# Patient Record
Sex: Female | Born: 1960 | Race: Black or African American | Hispanic: No | Smoking: Never smoker
Health system: Southern US, Community
[De-identification: ages and names within clinical notes are randomized; demographics above are authoritative.]

## PROBLEM LIST (undated history)

## (undated) DIAGNOSIS — T7840XA Allergy, unspecified, initial encounter: Secondary | ICD-10-CM

## (undated) DIAGNOSIS — I10 Essential (primary) hypertension: Secondary | ICD-10-CM

## (undated) DIAGNOSIS — R011 Cardiac murmur, unspecified: Secondary | ICD-10-CM

## (undated) DIAGNOSIS — E785 Hyperlipidemia, unspecified: Secondary | ICD-10-CM

## (undated) DIAGNOSIS — E119 Type 2 diabetes mellitus without complications: Secondary | ICD-10-CM

## (undated) HISTORY — DX: Type 2 diabetes mellitus without complications: E11.9

## (undated) HISTORY — PX: UTERINE FIBROID SURGERY: SHX826

## (undated) HISTORY — DX: Hyperlipidemia, unspecified: E78.5

## (undated) HISTORY — DX: Cardiac murmur, unspecified: R01.1

## (undated) HISTORY — PX: COLONOSCOPY: SHX174

## (undated) HISTORY — DX: Allergy, unspecified, initial encounter: T78.40XA

## (undated) HISTORY — DX: Essential (primary) hypertension: I10

---

## 1998-01-06 ENCOUNTER — Encounter: Payer: Self-pay | Admitting: Emergency Medicine

## 1998-01-06 ENCOUNTER — Emergency Department (HOSPITAL_COMMUNITY): Admission: EM | Admit: 1998-01-06 | Discharge: 1998-01-06 | Payer: Self-pay | Admitting: Emergency Medicine

## 1998-03-23 ENCOUNTER — Inpatient Hospital Stay (HOSPITAL_COMMUNITY): Admission: RE | Admit: 1998-03-23 | Discharge: 1998-03-25 | Payer: Self-pay | Admitting: Obstetrics and Gynecology

## 1999-03-28 ENCOUNTER — Other Ambulatory Visit: Admission: RE | Admit: 1999-03-28 | Discharge: 1999-03-28 | Payer: Self-pay | Admitting: Obstetrics and Gynecology

## 2000-06-04 ENCOUNTER — Other Ambulatory Visit: Admission: RE | Admit: 2000-06-04 | Discharge: 2000-06-04 | Payer: Self-pay | Admitting: Unknown Physician Specialty

## 2001-07-15 ENCOUNTER — Other Ambulatory Visit: Admission: RE | Admit: 2001-07-15 | Discharge: 2001-07-15 | Payer: Self-pay | Admitting: Obstetrics and Gynecology

## 2002-08-11 ENCOUNTER — Other Ambulatory Visit: Admission: RE | Admit: 2002-08-11 | Discharge: 2002-08-11 | Payer: Self-pay | Admitting: Obstetrics and Gynecology

## 2003-09-08 ENCOUNTER — Other Ambulatory Visit: Admission: RE | Admit: 2003-09-08 | Discharge: 2003-09-08 | Payer: Self-pay | Admitting: Obstetrics and Gynecology

## 2005-11-07 ENCOUNTER — Ambulatory Visit: Payer: Self-pay | Admitting: Family Medicine

## 2006-01-21 ENCOUNTER — Ambulatory Visit: Payer: Self-pay | Admitting: Family Medicine

## 2006-01-22 ENCOUNTER — Ambulatory Visit: Payer: Self-pay | Admitting: Family Medicine

## 2006-03-20 ENCOUNTER — Ambulatory Visit: Payer: Self-pay | Admitting: Family Medicine

## 2006-03-27 ENCOUNTER — Ambulatory Visit: Payer: Self-pay | Admitting: Family Medicine

## 2006-04-10 ENCOUNTER — Ambulatory Visit: Payer: Self-pay | Admitting: Family Medicine

## 2006-04-10 LAB — CONVERTED CEMR LAB
BUN: 9 mg/dL (ref 6–23)
CO2: 29 meq/L (ref 19–32)
Calcium: 9.4 mg/dL (ref 8.4–10.5)
Chloride: 99 meq/L (ref 96–112)
Creatinine, Ser: 0.9 mg/dL (ref 0.4–1.2)
GFR calc non Af Amer: 72 mL/min
Glomerular Filtration Rate, Af Am: 87 mL/min/{1.73_m2}
Glucose, Bld: 88 mg/dL (ref 70–99)
Potassium: 3.3 meq/L — ABNORMAL LOW (ref 3.5–5.1)
Sodium: 135 meq/L (ref 135–145)

## 2006-04-17 ENCOUNTER — Ambulatory Visit: Payer: Self-pay | Admitting: Family Medicine

## 2006-04-17 LAB — CONVERTED CEMR LAB: Potassium: 3.1 meq/L — ABNORMAL LOW (ref 3.5–5.1)

## 2006-04-25 ENCOUNTER — Ambulatory Visit: Payer: Self-pay | Admitting: Family Medicine

## 2006-04-25 LAB — CONVERTED CEMR LAB
BUN: 7 mg/dL (ref 6–23)
CO2: 29 meq/L (ref 19–32)
Calcium: 9 mg/dL (ref 8.4–10.5)
Chloride: 102 meq/L (ref 96–112)
Creatinine, Ser: 0.8 mg/dL (ref 0.4–1.2)
GFR calc non Af Amer: 82 mL/min
Glomerular Filtration Rate, Af Am: 99 mL/min/{1.73_m2}
Glucose, Bld: 128 mg/dL — ABNORMAL HIGH (ref 70–99)
Potassium: 3.5 meq/L (ref 3.5–5.1)
Sodium: 137 meq/L (ref 135–145)

## 2006-05-10 ENCOUNTER — Ambulatory Visit: Payer: Self-pay | Admitting: Family Medicine

## 2006-05-10 LAB — CONVERTED CEMR LAB
BUN: 9 mg/dL
CO2: 32 meq/L
Calcium: 9.5 mg/dL
Chloride: 101 meq/L
Creatinine, Ser: 0.8 mg/dL
GFR calc Af Amer: 99 mL/min
GFR calc non Af Amer: 82 mL/min
Glucose, Bld: 74 mg/dL
Potassium: 3.3 meq/L — ABNORMAL LOW
Sodium: 139 meq/L

## 2006-05-15 DIAGNOSIS — J309 Allergic rhinitis, unspecified: Secondary | ICD-10-CM

## 2006-05-15 HISTORY — DX: Allergic rhinitis, unspecified: J30.9

## 2006-05-17 ENCOUNTER — Ambulatory Visit: Payer: Self-pay | Admitting: Family Medicine

## 2006-05-17 LAB — CONVERTED CEMR LAB: Potassium: 4.1 meq/L (ref 3.5–5.3)

## 2006-06-21 ENCOUNTER — Ambulatory Visit: Payer: Self-pay | Admitting: Family Medicine

## 2006-06-21 LAB — CONVERTED CEMR LAB
BUN: 10 mg/dL (ref 6–23)
CO2: 29 meq/L (ref 19–32)
Calcium: 9.3 mg/dL (ref 8.4–10.5)
Chloride: 99 meq/L (ref 96–112)
Creatinine, Ser: 0.9 mg/dL (ref 0.4–1.2)
GFR calc Af Amer: 87 mL/min
GFR calc non Af Amer: 72 mL/min
Glucose, Bld: 83 mg/dL (ref 70–99)
Potassium: 3.4 meq/L — ABNORMAL LOW (ref 3.5–5.1)
Sodium: 135 meq/L (ref 135–145)

## 2006-07-19 ENCOUNTER — Ambulatory Visit: Payer: Self-pay | Admitting: Family Medicine

## 2006-07-19 LAB — CONVERTED CEMR LAB
BUN: 11 mg/dL (ref 6–23)
CO2: 30 meq/L (ref 19–32)
Calcium: 9.3 mg/dL (ref 8.4–10.5)
Chloride: 105 meq/L (ref 96–112)
Creatinine, Ser: 0.9 mg/dL (ref 0.4–1.2)
GFR calc Af Amer: 87 mL/min
GFR calc non Af Amer: 72 mL/min
Glucose, Bld: 125 mg/dL — ABNORMAL HIGH (ref 70–99)
Potassium: 3.6 meq/L (ref 3.5–5.1)
Sodium: 141 meq/L (ref 135–145)

## 2006-10-31 ENCOUNTER — Ambulatory Visit: Payer: Self-pay | Admitting: Family Medicine

## 2006-10-31 DIAGNOSIS — M549 Dorsalgia, unspecified: Secondary | ICD-10-CM

## 2006-10-31 DIAGNOSIS — I10 Essential (primary) hypertension: Secondary | ICD-10-CM

## 2006-10-31 HISTORY — DX: Essential (primary) hypertension: I10

## 2006-10-31 HISTORY — DX: Dorsalgia, unspecified: M54.9

## 2006-10-31 LAB — CONVERTED CEMR LAB
Bilirubin Urine: NEGATIVE
Blood in Urine, dipstick: NEGATIVE
Glucose, Urine, Semiquant: NEGATIVE
Ketones, urine, test strip: NEGATIVE
Nitrite: NEGATIVE
Protein, U semiquant: NEGATIVE
Specific Gravity, Urine: <1.005
Urobilinogen, UA: NEGATIVE
WBC Urine, dipstick: NEGATIVE
pH: 5

## 2006-11-03 LAB — CONVERTED CEMR LAB
BUN: 12 mg/dL (ref 6–23)
CO2: 29 meq/L (ref 19–32)
Calcium: 9.2 mg/dL (ref 8.4–10.5)
Chloride: 100 meq/L (ref 96–112)
Creatinine, Ser: 0.9 mg/dL (ref 0.4–1.2)
GFR calc Af Amer: 86 mL/min
GFR calc non Af Amer: 71 mL/min
Glucose, Bld: 97 mg/dL (ref 70–99)
Potassium: 3.3 meq/L — ABNORMAL LOW (ref 3.5–5.1)
Sodium: 138 meq/L (ref 135–145)

## 2006-11-04 ENCOUNTER — Telehealth (INDEPENDENT_AMBULATORY_CARE_PROVIDER_SITE_OTHER): Payer: Self-pay | Admitting: *Deleted

## 2006-11-11 ENCOUNTER — Ambulatory Visit: Payer: Self-pay | Admitting: Family Medicine

## 2006-11-11 LAB — CONVERTED CEMR LAB: Potassium: 3.6 meq/L (ref 3.5–5.1)

## 2006-11-12 ENCOUNTER — Telehealth (INDEPENDENT_AMBULATORY_CARE_PROVIDER_SITE_OTHER): Payer: Self-pay | Admitting: *Deleted

## 2006-11-13 ENCOUNTER — Encounter (INDEPENDENT_AMBULATORY_CARE_PROVIDER_SITE_OTHER): Payer: Self-pay | Admitting: *Deleted

## 2006-12-04 ENCOUNTER — Telehealth (INDEPENDENT_AMBULATORY_CARE_PROVIDER_SITE_OTHER): Payer: Self-pay | Admitting: *Deleted

## 2006-12-05 ENCOUNTER — Ambulatory Visit: Payer: Self-pay | Admitting: Family Medicine

## 2006-12-09 ENCOUNTER — Encounter (INDEPENDENT_AMBULATORY_CARE_PROVIDER_SITE_OTHER): Payer: Self-pay | Admitting: *Deleted

## 2006-12-09 LAB — CONVERTED CEMR LAB
BUN: 8 mg/dL (ref 6–23)
CO2: 30 meq/L (ref 19–32)
Calcium: 9.3 mg/dL (ref 8.4–10.5)
Chloride: 101 meq/L (ref 96–112)
Creatinine, Ser: 0.8 mg/dL (ref 0.4–1.2)
GFR calc Af Amer: 99 mL/min
GFR calc non Af Amer: 82 mL/min
Glucose, Bld: 87 mg/dL (ref 70–99)
Potassium: 3.8 meq/L (ref 3.5–5.1)
Sodium: 136 meq/L (ref 135–145)

## 2007-01-01 ENCOUNTER — Ambulatory Visit: Payer: Self-pay | Admitting: Family Medicine

## 2007-01-01 LAB — CONVERTED CEMR LAB
BUN: 10 mg/dL (ref 6–23)
CO2: 29 meq/L (ref 19–32)
Calcium: 9.2 mg/dL (ref 8.4–10.5)
Chloride: 104 meq/L (ref 96–112)
Creatinine, Ser: 0.8 mg/dL (ref 0.4–1.2)
GFR calc Af Amer: 99 mL/min
GFR calc non Af Amer: 82 mL/min
Glucose, Bld: 70 mg/dL (ref 70–99)
Potassium: 3.5 meq/L (ref 3.5–5.1)
Sodium: 140 meq/L (ref 135–145)

## 2007-01-02 ENCOUNTER — Telehealth (INDEPENDENT_AMBULATORY_CARE_PROVIDER_SITE_OTHER): Payer: Self-pay | Admitting: *Deleted

## 2007-01-30 ENCOUNTER — Ambulatory Visit: Payer: Self-pay | Admitting: Family Medicine

## 2007-01-30 DIAGNOSIS — M25569 Pain in unspecified knee: Secondary | ICD-10-CM | POA: Insufficient documentation

## 2007-01-30 HISTORY — DX: Pain in unspecified knee: M25.569

## 2007-02-02 LAB — CONVERTED CEMR LAB
Cholesterol: 199 mg/dL (ref 0–200)
HDL: 66.4 mg/dL (ref >39.0)
LDL Cholesterol: 125 mg/dL — ABNORMAL HIGH (ref 0–99)
Total CHOL/HDL Ratio: 3
Triglycerides: 36 mg/dL (ref 0–149)
VLDL: 7 mg/dL (ref 0–40)

## 2007-02-03 ENCOUNTER — Telehealth (INDEPENDENT_AMBULATORY_CARE_PROVIDER_SITE_OTHER): Payer: Self-pay | Admitting: *Deleted

## 2007-02-17 ENCOUNTER — Telehealth (INDEPENDENT_AMBULATORY_CARE_PROVIDER_SITE_OTHER): Payer: Self-pay | Admitting: *Deleted

## 2007-02-20 ENCOUNTER — Encounter (INDEPENDENT_AMBULATORY_CARE_PROVIDER_SITE_OTHER): Payer: Self-pay | Admitting: Family Medicine

## 2007-02-28 ENCOUNTER — Ambulatory Visit: Payer: Self-pay | Admitting: Family Medicine

## 2007-02-28 ENCOUNTER — Telehealth (INDEPENDENT_AMBULATORY_CARE_PROVIDER_SITE_OTHER): Payer: Self-pay | Admitting: *Deleted

## 2007-02-28 ENCOUNTER — Encounter (INDEPENDENT_AMBULATORY_CARE_PROVIDER_SITE_OTHER): Payer: Self-pay | Admitting: *Deleted

## 2007-02-28 DIAGNOSIS — R1013 Epigastric pain: Secondary | ICD-10-CM

## 2007-02-28 DIAGNOSIS — R141 Gas pain: Secondary | ICD-10-CM | POA: Insufficient documentation

## 2007-02-28 DIAGNOSIS — K5289 Other specified noninfective gastroenteritis and colitis: Secondary | ICD-10-CM | POA: Insufficient documentation

## 2007-02-28 DIAGNOSIS — R143 Flatulence: Secondary | ICD-10-CM

## 2007-02-28 DIAGNOSIS — R142 Eructation: Secondary | ICD-10-CM

## 2007-02-28 HISTORY — DX: Other specified noninfective gastroenteritis and colitis: K52.89

## 2007-02-28 HISTORY — DX: Epigastric pain: R10.13

## 2007-02-28 HISTORY — DX: Gas pain: R14.1

## 2007-02-28 LAB — CONVERTED CEMR LAB
ALT: 19 units/L (ref 0–35)
AST: 23 units/L (ref 0–37)
Albumin: 4 g/dL (ref 3.5–5.2)
Alkaline Phosphatase: 59 units/L (ref 39–117)
Amylase: 121 units/L (ref 27–131)
Basophils Absolute: 0 10*3/uL (ref 0.0–0.1)
Basophils Relative: 0.8 % (ref 0.0–1.0)
Bilirubin, Direct: 0.1 mg/dL (ref 0.0–0.3)
Eosinophils Absolute: 0.1 10*3/uL (ref 0.0–0.6)
Eosinophils Relative: 1.5 % (ref 0.0–5.0)
HCT: 43.4 % (ref 36.0–46.0)
Hemoglobin: 14.9 g/dL (ref 12.0–15.0)
Lipase: 17 units/L (ref 11.0–59.0)
Lymphocytes Relative: 55.1 % — ABNORMAL HIGH (ref 12.0–46.0)
MCHC: 34.4 g/dL (ref 30.0–36.0)
MCV: 94.7 fL (ref 78.0–100.0)
Monocytes Absolute: 0.5 10*3/uL (ref 0.2–0.7)
Monocytes Relative: 11.2 % — ABNORMAL HIGH (ref 3.0–11.0)
Neutro Abs: 1.3 10*3/uL — ABNORMAL LOW (ref 1.4–7.7)
Neutrophils Relative %: 31.4 % — ABNORMAL LOW (ref 43.0–77.0)
Platelets: 216 10*3/uL (ref 150–400)
RBC: 4.58 M/uL (ref 3.87–5.11)
RDW: 12.1 % (ref 11.5–14.6)
Total Bilirubin: 1 mg/dL (ref 0.3–1.2)
Total Protein: 7.2 g/dL (ref 6.0–8.3)
WBC: 4.1 10*3/uL — ABNORMAL LOW (ref 4.5–10.5)

## 2007-03-17 ENCOUNTER — Telehealth (INDEPENDENT_AMBULATORY_CARE_PROVIDER_SITE_OTHER): Payer: Self-pay | Admitting: *Deleted

## 2007-03-21 ENCOUNTER — Encounter (INDEPENDENT_AMBULATORY_CARE_PROVIDER_SITE_OTHER): Payer: Self-pay | Admitting: *Deleted

## 2007-04-02 ENCOUNTER — Ambulatory Visit: Payer: Self-pay | Admitting: Family Medicine

## 2007-04-02 DIAGNOSIS — J019 Acute sinusitis, unspecified: Secondary | ICD-10-CM

## 2007-04-02 HISTORY — DX: Acute sinusitis, unspecified: J01.90

## 2007-04-04 ENCOUNTER — Telehealth (INDEPENDENT_AMBULATORY_CARE_PROVIDER_SITE_OTHER): Payer: Self-pay | Admitting: *Deleted

## 2007-04-04 ENCOUNTER — Telehealth (INDEPENDENT_AMBULATORY_CARE_PROVIDER_SITE_OTHER): Payer: Self-pay | Admitting: Family Medicine

## 2007-04-05 ENCOUNTER — Telehealth (INDEPENDENT_AMBULATORY_CARE_PROVIDER_SITE_OTHER): Payer: Self-pay | Admitting: *Deleted

## 2007-05-27 ENCOUNTER — Ambulatory Visit: Payer: Self-pay | Admitting: Family Medicine

## 2007-05-28 ENCOUNTER — Encounter (INDEPENDENT_AMBULATORY_CARE_PROVIDER_SITE_OTHER): Payer: Self-pay | Admitting: *Deleted

## 2007-05-28 LAB — CONVERTED CEMR LAB
BUN: 10 mg/dL (ref 6–23)
CO2: 29 meq/L (ref 19–32)
Calcium: 9.5 mg/dL (ref 8.4–10.5)
Chloride: 104 meq/L (ref 96–112)
Cholesterol: 200 mg/dL (ref 0–200)
Creatinine, Ser: 0.9 mg/dL (ref 0.4–1.2)
GFR calc Af Amer: 86 mL/min
GFR calc non Af Amer: 71 mL/min
Glucose, Bld: 101 mg/dL — ABNORMAL HIGH (ref 70–99)
HDL: 67.8 mg/dL (ref >39.0)
LDL Cholesterol: 124 mg/dL — ABNORMAL HIGH (ref 0–99)
Potassium: 3.5 meq/L (ref 3.5–5.1)
Sodium: 140 meq/L (ref 135–145)
Total CHOL/HDL Ratio: 2.9
Triglycerides: 41 mg/dL (ref 0–149)
VLDL: 8 mg/dL (ref 0–40)

## 2007-06-30 ENCOUNTER — Telehealth (INDEPENDENT_AMBULATORY_CARE_PROVIDER_SITE_OTHER): Payer: Self-pay | Admitting: *Deleted

## 2007-07-02 ENCOUNTER — Ambulatory Visit: Payer: Self-pay | Admitting: Family Medicine

## 2007-07-03 ENCOUNTER — Encounter (INDEPENDENT_AMBULATORY_CARE_PROVIDER_SITE_OTHER): Payer: Self-pay | Admitting: *Deleted

## 2007-07-03 ENCOUNTER — Telehealth (INDEPENDENT_AMBULATORY_CARE_PROVIDER_SITE_OTHER): Payer: Self-pay | Admitting: *Deleted

## 2007-07-03 LAB — CONVERTED CEMR LAB: TSH: 1.03 microintl units/mL (ref 0.35–5.50)

## 2007-11-05 ENCOUNTER — Ambulatory Visit: Payer: Self-pay | Admitting: Family Medicine

## 2007-11-05 DIAGNOSIS — I10 Essential (primary) hypertension: Secondary | ICD-10-CM

## 2007-11-05 HISTORY — DX: Essential (primary) hypertension: I10

## 2007-11-16 LAB — CONVERTED CEMR LAB
ALT: 17 units/L (ref 0–35)
AST: 22 units/L (ref 0–37)
Albumin: 4.1 g/dL (ref 3.5–5.2)
Alkaline Phosphatase: 60 units/L (ref 39–117)
BUN: 7 mg/dL (ref 6–23)
Bilirubin, Direct: 0.1 mg/dL (ref 0.0–0.3)
CO2: 30 meq/L (ref 19–32)
Calcium: 9.2 mg/dL (ref 8.4–10.5)
Chloride: 104 meq/L (ref 96–112)
Cholesterol: 192 mg/dL (ref 0–200)
Creatinine, Ser: 0.8 mg/dL (ref 0.4–1.2)
GFR calc Af Amer: 98 mL/min
GFR calc non Af Amer: 81 mL/min
Glucose, Bld: 100 mg/dL — ABNORMAL HIGH (ref 70–99)
HDL: 57.6 mg/dL (ref >39.0)
LDL Cholesterol: 123 mg/dL — ABNORMAL HIGH (ref 0–99)
Potassium: 3.6 meq/L (ref 3.5–5.1)
Sodium: 139 meq/L (ref 135–145)
Total Bilirubin: 1.1 mg/dL (ref 0.3–1.2)
Total CHOL/HDL Ratio: 3.3
Total Protein: 7.2 g/dL (ref 6.0–8.3)
Triglycerides: 59 mg/dL (ref 0–149)
VLDL: 12 mg/dL (ref 0–40)

## 2007-11-17 ENCOUNTER — Encounter (INDEPENDENT_AMBULATORY_CARE_PROVIDER_SITE_OTHER): Payer: Self-pay | Admitting: *Deleted

## 2007-11-20 ENCOUNTER — Ambulatory Visit: Payer: Self-pay | Admitting: Family Medicine

## 2007-11-20 DIAGNOSIS — R011 Cardiac murmur, unspecified: Secondary | ICD-10-CM

## 2007-11-20 HISTORY — DX: Cardiac murmur, unspecified: R01.1

## 2007-11-26 ENCOUNTER — Encounter: Payer: Self-pay | Admitting: Family Medicine

## 2007-11-26 ENCOUNTER — Ambulatory Visit: Payer: Self-pay

## 2007-12-01 ENCOUNTER — Encounter (INDEPENDENT_AMBULATORY_CARE_PROVIDER_SITE_OTHER): Payer: Self-pay | Admitting: *Deleted

## 2007-12-22 ENCOUNTER — Telehealth (INDEPENDENT_AMBULATORY_CARE_PROVIDER_SITE_OTHER): Payer: Self-pay | Admitting: *Deleted

## 2007-12-22 ENCOUNTER — Encounter (INDEPENDENT_AMBULATORY_CARE_PROVIDER_SITE_OTHER): Payer: Self-pay | Admitting: *Deleted

## 2008-01-16 ENCOUNTER — Telehealth (INDEPENDENT_AMBULATORY_CARE_PROVIDER_SITE_OTHER): Payer: Self-pay | Admitting: *Deleted

## 2008-02-06 ENCOUNTER — Telehealth (INDEPENDENT_AMBULATORY_CARE_PROVIDER_SITE_OTHER): Payer: Self-pay | Admitting: *Deleted

## 2008-05-07 ENCOUNTER — Telehealth: Payer: Self-pay | Admitting: Family Medicine

## 2008-05-07 ENCOUNTER — Encounter: Payer: Self-pay | Admitting: Family Medicine

## 2008-08-06 ENCOUNTER — Telehealth (INDEPENDENT_AMBULATORY_CARE_PROVIDER_SITE_OTHER): Payer: Self-pay | Admitting: *Deleted

## 2008-12-10 ENCOUNTER — Telehealth (INDEPENDENT_AMBULATORY_CARE_PROVIDER_SITE_OTHER): Payer: Self-pay | Admitting: *Deleted

## 2008-12-10 ENCOUNTER — Encounter: Payer: Self-pay | Admitting: Family Medicine

## 2008-12-13 ENCOUNTER — Telehealth (INDEPENDENT_AMBULATORY_CARE_PROVIDER_SITE_OTHER): Payer: Self-pay | Admitting: *Deleted

## 2010-05-25 NOTE — Progress Notes (Signed)
  Phone Note Outgoing Call Call back at [facility] Phone [phone]   Call placed by: Ardyth Man,  January 16, 2008 9:19 AM Call placed to: Patient Summary of Call: Patient aware by way of message per patient request echo normal Ardyth Man  January 16, 2008 9:19 AM

## 2010-05-25 NOTE — Letter (Signed)
Summary: Work Dietitian at [employer]  [address] [place], [place] [address]   Phone: [phone]  Fax: [phone]    Today's Date: December 10, 2008  Name of Patient: Angela Garner  The above named patient had a medical visit today at:  am / pm.  Please take this into consideration when reviewing the time away from work/school.    Special Instructions:  [  ] None  [  ] To be off the remainder of today, returning to the normal work / school schedule tomorrow.  [  ] To be off until the next scheduled appointment on ______________________.  [  X] Other It is safe for pt to participate in yoga while on bp medication.________________________________________________________________ ________________________________________________________________________   Sincerely yours,   Loreen Freud DO

## 2010-05-25 NOTE — Assessment & Plan Note (Signed)
Summary: BP HIGH/CDJ   Vital Signs:  Patient Profile:   50 Years Old Female Weight:      193 pounds Pulse rate:   66 / minute BP sitting:   132 / 90  (left arm)  Vitals Entered By: Doristine Devoid (December 05, 2006 12:03 PM)               Chief Complaint:  BP HAS BEEN UP SINCE MON FELT SOMETHING WASNT RIGHT BECAUSE PT HAD HEADACHE AND DOESN'T NORMALLY HAVE THEM.  SHE ALSO HAD A PREVIOUS DR. APPT THAT REVEALED BP HIGH AT 150/100. Marland Kitchen  History of Present Illness: Reports noted elevation of BP over last couple days. No other complaints except mild headache which resolved. The patient was seen by her urologist yesterday and her blood pressure was also elevated at that office.The patient denies any chest pain or shortness of breath.        Physical Exam  General:     Well-developed,well-nourished,in no acute distress; alert,appropriate and cooperative throughout examination Neck:     No deformities, masses, or tenderness noted. Lungs:     Normal respiratory effort, chest expands symmetrically. Lungs are clear to auscultation, no crackles or wheezes. Heart:     regular rhythm, no murmur, no gallop, and no rub.   Extremities:     No clubbing, cyanosis, edema, or deformity noted with normal full range of motion of all joints.   Additional Exam:     her EKG showed sinus bradycardia with no ST elevations or depressions.  There is no PVCs or PACs.    Impression & Recommendations:  Problem # 1:  HYPERTENSION, BENIGN ESSENTIAL (ICD-74.19) 50 year old female with elevation of her blood pressure. She will start lisinopril 10 mg daily.  Side effects were reviewed. Patient follow with me in one week. Her updated medication list for this problem includes:    Cartia Xt 300 Mg Cp24 (Diltiazem hcl coated beads) .Marland Kitchen... 1 by mouth qd    Lisinopril 10 Mg Tabs (Lisinopril) .Marland Kitchen... 1 by mouth qd  Orders: TLB-BMP (Basic Metabolic Panel-BMET) (80048-METABOL)  BP today: 132/90 Prior BP: 120/80  (10/31/2006)  Labs Reviewed: Creat: 0.9 (10/31/2006)   Complete Medication List: 1)  Cartia Xt 300 Mg Cp24 (Diltiazem hcl coated beads) .Marland Kitchen.. 1 by mouth qd 2)  Lisinopril 10 Mg Tabs (Lisinopril) .Marland Kitchen.. 1 by mouth qd     Prescriptions: LISINOPRIL 10 MG  TABS (LISINOPRIL) 1 by mouth qd  #90 x 1   Entered and Authorized by:   Leanne Chang MD   Signed by:   Leanne Chang MD on 12/05/2006   Method used:   Print then Give to Patient   RxID:   [id number] CARTIA XT 300 MG  CP24 (DILTIAZEM HCL COATED BEADS) 1 by mouth qd  #30 x 0   Entered and Authorized by:   Leanne Chang MD   Signed by:   Leanne Chang MD on 12/05/2006   Method used:   Historical   RxID:   [id number]

## 2010-05-25 NOTE — Assessment & Plan Note (Signed)
Summary: pt is having problems going to the bathroom she is having gas...   Vital Signs:  Patient Profile:   50 Years Old Female Weight:      186.6 pounds Temp:     98.1 degrees F Pulse rate:   64 / minute BP sitting:   130 / 88  (right arm)  Vitals Entered By: Doristine Devoid (February 28, 2007 11:08 AM)                 Chief Complaint:  STOMACH DISCOMFORT WITH BURNING FEELING SINCE FRIDAY ALOT OF NAUSEA .  History of Present Illness: Started 1 week ago with nausea, diarrhea, flatulence. indigestion, body aches. Reports diarrhea resolved but still having some burning sensation in  epigastric area which occurs after eating. Not severe. Denies vomiting, fever or chills. Drinking plenty of fluid.  Denies urinary symptoms.        Physical Exam  General:     Well-developed,well-nourished,in no acute distress; alert,appropriate and cooperative throughout examination Mouth:     Oral mucosa and oropharynx without lesions or exudates.  Moist. Neck:     No deformities, masses, or tenderness noted. Lungs:     Normal respiratory effort, chest expands symmetrically. Lungs are clear to auscultation, no crackles or wheezes. Heart:     Normal rate and regular rhythm. S1 and S2 normal without gallop, murmur, click, rub or other extra sounds. Abdomen:     Mild epigastric tenderness. No rebound or guarding.no rigidity, no abdominal hernia, no inguinal hernia, no hepatomegaly, no splenomegaly, and bowel sounds hyperactive.      Impression & Recommendations:  Problem # 1:  GASTROENTERITIS (ICD-558.9) Symptoms consistent with gastroenteritis, but given discomfort of epigastrum, Hepatobiliary disease, needs to be r/o  Discussed use of medication and role of diet. Encouraged clear liquids and electrolyte replacement fluids. Instructed to call if any signs of worsening dehydration.  Advise OTC Prilosec daily for 2 weeks F/u if symptoms worsen  Problem # 2:  ABDOMINAL PAIN, EPIGASTRIC  (ICD-789.06) As stated above. Orders: Radiology Referral (Radiology)   Complete Medication List: 1)  Cartia Xt 300 Mg Cp24 (Diltiazem hcl coated beads) .Marland Kitchen.. 1 by mouth qd 2)  Lisinopril 10 Mg Tabs (Lisinopril) .Marland Kitchen.. 1 by mouth qd  Other Orders: TLB-Hepatic/Liver Function Pnl (80076-HEPATIC) Venipuncture (57846) TLB-CBC Platelet - w/Differential (85025-CBCD) TLB-Amylase (82150-AMYL) TLB-Lipase (83690-LIPASE)     ]

## 2010-05-25 NOTE — Letter (Signed)
Summary: Results Follow-up Letter  [place] [facility]  [address] [place], [place] [address]   Phone: [phone]  Fax: [phone]    12/01/2007        Angela Garner [address] [place], [place]  [address]  Dear Ms. AFARI,   The following are the results of your recent test(s):  Test     Result     Pap Smear    Normal_______  Not Normal_____       Comments: _________________________________________________________ Cholesterol LDL(Bad cholesterol):          Your goal is less than:         HDL (Good cholesterol):        Your goal is more than: _________________________________________________________ Other Tests:   _________________________________________________________  Please call for an appointment Or _Please see attached report.________________________________________________________ _________________________________________________________ _________________________________________________________  Sincerely,  Ardyth Man [place] at [facility]

## 2010-05-25 NOTE — Progress Notes (Signed)
  Phone Note Outgoing Call Call back at [facility] Phone [phone]   Call placed by: Ardyth Man,  February 03, 2007 9:16 AM Call placed to: Patient Summary of Call: [facility] ...................................................................Ardyth Man  February 03, 2007 9:17 AM   Follow-up for Phone Call        Pt. aware ...................................................................Ardyth Man  February 03, 2007 12:47 PM  Follow-up by: Ardyth Man,  February 03, 2007 12:47 PM

## 2010-05-25 NOTE — Progress Notes (Signed)
Summary:  OUT OF MEDS  Phone Note Call from Patient Call back at [facility] -[phone]   Caller: Patient Summary of Call: PATIENT SAYS SHE DOES NOT HAVE ANY INSURANCE AT THIS TIME, BUT SAYS SHE NEEDS TO SEE DR LOWNE FOR A MED REFILL OFFICE VISIT---SAYS SHE CANT AFFORD THE VISIT EVEN IF OUR TOTAL CHARGE IS ONLY $125 BECAUSE SHE KNOWS THAT DOES NOT INCLUDE LABS---CAN SHE GET MORE REFILLS AT THIS TIME AND PUT OFF OFFICE VISIT FOR A WHILE??   ( I CAN SEND HER THE PAPERWORK ABOUT HELP ON PAYMENTS)  SHE DID NOT TELL ME WHICH OF HER MEDICATIONS SHE IS OUT  PLEASE CALL [phone] AND LEAVE MESSAGE IF SHE DOESNT ANSWER,  OR CALL [phone] Initial call taken by: Jerolyn Shin,  December 13, 2008 3:12 PM  Follow-up for Phone Call        Pt has not been here in a year!!  We can not put it off anymore.  We may be able to put off labs.   Follow-up by: Loreen Freud DO,  December 13, 2008 5:11 PM  Additional Follow-up for Phone Call Additional follow up Details #1::        left message to call  office...Marland KitchenMarland KitchenFelecia Deloach CMA  December 14, 2008 10:31 AM left message to call  office.Felecia Deloach CMA  December 15, 2008 1:23 PM    Additional Follow-up for Phone Call Additional follow up Details #2::    left pt detail message of dr Laury Axon suggestion...............Marland KitchenFelecia Deloach CMA  December 17, 2008 12:11 PM

## 2010-05-25 NOTE — Letter (Signed)
Summary: Results Follow-up Letter  [place] [facility]  [address] [place], [place] [address]   Phone: [phone]  Fax: [phone]    11/13/2006        Lauretta Sallas [address] [place], [place]  [address]  Dear Ms. AFARI,   The following are the results of your recent test(s):  Test     Result     Pap Smear    Normal_______  Not Normal_____       Comments: _________________________________________________________ Cholesterol LDL(Bad cholesterol):          Your goal is less than:         HDL (Good cholesterol):        Your goal is more than: _________________________________________________________ Other Tests:   _________________________________________________________  Please call for an appointment Or Please see attached._________________________________________________________ _________________________________________________________ _________________________________________________________  Sincerely,  Ardyth Man [place] at [facility]

## 2010-05-25 NOTE — Progress Notes (Signed)
  Phone Note Outgoing Call Call back at [facility]   Call placed by: Ardyth Man,  February 28, 2007 4:42 PM Call placed to: Specialist Summary of Call: Spoke w/ [facility], Corie technologist, Negative study ...................................................................Ardyth Man  February 28, 2007 4:52 PM   Follow-up for Phone Call        Advise patient okay. Hopefully she is feeling better. Follow-up by: Leanne Chang MD,  March 03, 2007 3:40 PM  Additional Follow-up for Phone Call Additional follow up Details #1::        [facility] Additional Follow-up by: Shonna Chock,  March 04, 2007 4:41 PM    Additional Follow-up for Phone Call Additional follow up Details #2::    Pt. c/o pt informed, doing some better aware if needs to come back in okay pt. will let us know in 7 days if feeling worse or bettter. ..................................................................Marland KitchenFloydene Flock  March 05, 2007 10:26 AM  Follow-up by: Floydene Flock,  March 05, 2007 10:26 AM

## 2010-05-25 NOTE — Progress Notes (Signed)
Summary: ALEJANDRO-REFILL REQUESTED CARTIA XT 300MG   Phone Note Refill Request   Refills Requested: Medication #1:  CARTIA XT 300 MG  CP24 1 by mouth qd RECD BY FAX [facility] [phone] LAST FILLED #30 ON 9.23.08  Initial call taken by: Gwen Pounds,  February 17, 2007 12:41 PM      Prescriptions: CARTIA XT 300 MG  CP24 (DILTIAZEM HCL COATED BEADS) 1 by mouth qd  #30 x 3   Entered by:   Ardyth Man   Authorized by:   Leanne Chang MD   Signed by:   Ardyth Man on 02/17/2007   Method used:   Electronically sent to ...       [facility]  [address] [id number]       [address]       [place], [place]  [address]       Ph: [phone] or [phone]       Fax: [phone]   RxID:   [id number]

## 2010-05-25 NOTE — Letter (Signed)
Summary: Results Follow-up Letter  [place] [facility] [place]  [address]   [place], [place] [address]   Phone: [phone]  Fax: [phone]    03/21/2007        Keitha A. Afari [address] [place], [place]  [address]  Dear Ms. AFARI,   The following are the results of your recent test(s):  Test     Result     Pap Smear    Normal_______  Not Normal_____       Comments: _________________________________________________________ Cholesterol LDL(Bad cholesterol):          Your goal is less than:         HDL (Good cholesterol):        Your goal is more than: _________________________________________________________ Other Tests:   _________________________________________________________  Please call for an appointment Or __Please see attached._______________________________________________________ _________________________________________________________ _________________________________________________________  Sincerely,  Michelle Utrera Brooks at [facility]          

## 2010-05-25 NOTE — Progress Notes (Signed)
Summary: rx mail order - dr Blossom Hoops  Phone Note Refill Request Call back at Work Phone [phone]   Refills Requested: Medication #1:  CARTIA XT 300 MG  CP24 1 by mouth qd patient's ins will end at the end of the month she needs rx for mail order - 3 month supply call when she can pick up  Initial call taken by: Okey Regal [place],  July 03, 2007 1:29 PM  Follow-up for Phone Call        Patient aware to pick up rx up front. ...................................................................Ardyth Man  July 03, 2007 1:35 PM  Follow-up by: Ardyth Man,  July 03, 2007 1:35 PM      Prescriptions: CARTIA XT 300 MG  CP24 (DILTIAZEM HCL COATED BEADS) 1 by mouth qd  #90 x 3   Entered by:   Ardyth Man   Authorized by:   Leanne Chang MD   Signed by:   Ardyth Man on 07/03/2007   Method used:   Print then Give to Patient   RxID:   [id number]

## 2010-05-25 NOTE — Progress Notes (Signed)
Summary: medication refill -lmom  Phone Note Refill Request Call back at [facility] Phone [phone] Call back at [phone]   Refills Requested: Medication #1:  CARTIA XT 300 MG  CP24 1 by mouth qd 90 day supply mail order  patient only has 3 pills left   Method Requested: Mail to Patient Initial call taken by: Charolette Child,  June 30, 2007 11:11 AM  Follow-up for Phone Call        left message on machine patient was given 90 x3 on 06/02/07 patient should have refills ..................................................................Marland KitchenDoristine Devoid  July 01, 2007 8:57 AM   spoke with patient insurance has changed from job so now needs to have 90 day supply to mail away to [facility] will pick up at office visit tomorrow ..................................................................Marland KitchenDoristine Devoid  July 01, 2007 2:40 PM

## 2010-05-25 NOTE — Assessment & Plan Note (Signed)
Summary: roa 4 months.cbs   Vital Signs:  Patient Profile:   50 Years Old Female Weight:      189.38 pounds Temp:     98.4 degrees F oral Pulse rate:   64 / minute Resp:     14 per minute BP sitting:   130 / 82  (left arm)  Pt. in pain?   no  Vitals Entered By: Ardyth Man (May 27, 2007 10:37 AM)                  Chief Complaint:  4 month BP Check.  History of Present Illness:  Hypertension Follow-Up      This is a 50 year old woman who presents for Hypertension follow-up.  The patient denies the following associated symptoms: chest pain, leg edema, and pedal edema.  Compliance with medications (by patient report) has been near 100%.  The patient reports that dietary compliance has been excellent.  The patient reports exercising 3-4X per week.    Current Allergies: ! PENICILLIN      Physical Exam  General:     Well-developed,well-nourished,in no acute distress; alert,appropriate and cooperative throughout examination Neck:     No deformities, masses, or tenderness noted. Lungs:     Normal respiratory effort, chest expands symmetrically. Lungs are clear to auscultation, no crackles or wheezes. Heart:     Normal rate and regular rhythm. S1 and S2 normal without gallop, murmur, click, rub or other extra sounds. Pulses:     R and L carotid,radial,dorsalis pedis and posterior tibial pulses are full and equal bilaterally Extremities:     No clubbing, cyanosis, edema    Impression & Recommendations:  Problem # 1:  HYPERTENSION, BENIGN ESSENTIAL (ICD-401.1) At goal Continue current medication nonfasting F/u in 3-4 months and as needed. The following medications were removed from the medication list:    Diltiazem Hcl 120 Mg Tabs (Diltiazem hcl) .Marland Kitchen... Take one and one half tablets twice daily as directed  Her updated medication list for this problem includes:    Cartia Xt 300 Mg Cp24 (Diltiazem hcl coated beads) .Marland Kitchen... 1 by mouth qd    Lisinopril 10 Mg  Tabs (Lisinopril) .Marland Kitchen... 1 by mouth qd  Orders: Venipuncture (62952) TLB-BMP (Basic Metabolic Panel-BMET) (80048-METABOL) TLB-Lipid Panel (80061-LIPID)  BP today: 130/82 Prior BP: 130/90 (04/02/2007)  Labs Reviewed: Creat: 0.8 (01/01/2007) Chol: 199 (01/30/2007)   HDL: 66.4 (01/30/2007)   LDL: 125 (01/30/2007)   TG: 36 (01/30/2007)   Complete Medication List: 1)  Cartia Xt 300 Mg Cp24 (Diltiazem hcl coated beads) .Marland Kitchen.. 1 by mouth qd 2)  Lisinopril 10 Mg Tabs (Lisinopril) .Marland Kitchen.. 1 by mouth qd 3)  Magic Mouthwash  .... 1 tsp swish and spit qid prn     Prescriptions: CARTIA XT 300 MG  CP24 (DILTIAZEM HCL COATED BEADS) 1 by mouth qd  #90 x 3   Entered by:   Ardyth Man   Authorized by:   Leanne Chang MD   Signed by:   Ardyth Man on 05/27/2007   Method used:   Electronically sent to ...       [facility]       [address]       [place], [place]  [address]       Ph: [phone]       Fax: [phone]   RxID:   [id number] LISINOPRIL 10 MG  TABS (LISINOPRIL) 1 by mouth qd  #  90 x 3   Entered by:   Ardyth Man   Authorized by:   Leanne Chang MD   Signed by:   Ardyth Man on 05/27/2007   Method used:   Electronically sent to ...       [facility]       [address]       [place], [place]  [address]       Ph: [phone]       Fax: [phone]   RxID:   [id number]  ]

## 2010-05-25 NOTE — Progress Notes (Signed)
  Phone Note Outgoing Call Call back at [facility] Phone [phone]   Call placed by: Ardyth Man,  January 02, 2007 8:11 AM Call placed to: Patient Summary of Call: Pt. aware ...................................................................Ardyth Man  January 02, 2007 8:11 AM

## 2010-05-25 NOTE — Assessment & Plan Note (Signed)
Summary: roa 2 weeks,cbs   Vital Signs:  Patient Profile:   50 Years Old Female Weight:      199.13 pounds Temp:     98.6 degrees F oral Pulse rate:   62 / minute Resp:     14 per minute BP sitting:   110 / 78  (right arm)  Pt. in pain?   no  Vitals Entered By: Ardyth Man (November 20, 2007 2:01 PM)                  PCP:  Laury Axon  Chief Complaint:  2 weeks.  History of Present Illness:  Hypertension Follow-Up      This is a 50 year old woman who presents for Hypertension follow-up.  The patient denies lightheadedness, urinary frequency, headaches, edema, impotence, rash, and fatigue.  The patient denies the following associated symptoms: chest pain, chest pressure, exercise intolerance, dyspnea, palpitations, syncope, leg edema, and pedal edema.  Compliance with medications (by patient report) has been near 100%.  The patient reports that dietary compliance has been good.  The patient reports exercising 3-4X per week.  Adjunctive measures currently used by the patient include salt restriction.      Current Allergies: ! PENICILLIN  Past Medical History:    Reviewed history from 11/05/2007 and no changes required:       Allergic rhinitis       Hypertension  Past Surgical History:    Reviewed history from 10/31/2006 and no changes required:       fibroid surgery   Family History:    Reviewed history from 10/31/2006 and no changes required:       family history of hypertension, and diabetes  Social History:    Reviewed history from 10/31/2006 and no changes required:       patient type no electronics.       Single       denies alcohol and tobacco use.       Denies drug use   Risk Factors:   Review of Systems      See HPI   Physical Exam  General:     Well-developed,well-nourished,in no acute distress; alert,appropriate and cooperative throughout examination Lungs:     Normal respiratory effort, chest expands symmetrically. Lungs are clear to  auscultation, no crackles or wheezes. Heart:     normal rate, regular rhythm, and Grade  2 /6 systolic ejection murmur.   Extremities:     No clubbing, cyanosis, edema, or deformity noted with normal full range of motion of all joints.   Skin:     Intact without suspicious lesions or rashes Psych:     Oriented X3, memory intact for recent and remote, and normally interactive.      Impression & Recommendations:  Problem # 1:  HYPERTENSION (ICD-401.9) rto 3-4 months Her updated medication list for this problem includes:    Cartia Xt 300 Mg Cp24 (Diltiazem hcl coated beads) .Marland Kitchen... 1 by mouth qd    Lisinopril 10 Mg Tabs (Lisinopril) .Marland Kitchen... 1 by mouth once daily  BP today: 110/78 Prior BP: 140/100 (11/05/2007)  Labs Reviewed: Creat: 0.8 (11/05/2007) Chol: 192 (11/05/2007)   HDL: 57.6 (11/05/2007)   LDL: 123 (11/05/2007)   TG: 59 (11/05/2007)   Problem # 2:  MURMUR (ICD-785.2)  Orders: Echo Referral (Echo)   Complete Medication List: 1)  Cartia Xt 300 Mg Cp24 (Diltiazem hcl coated beads) .Marland Kitchen.. 1 by mouth qd 2)  Lisinopril 10 Mg Tabs (Lisinopril) .Marland KitchenMarland KitchenMarland Kitchen 1  by mouth once daily 3)  Cod Liver Oil Caps (Cod liver oil) .Marland Kitchen.. 1 by mouth once daily    Prescriptions: LISINOPRIL 10 MG  TABS (LISINOPRIL) 1 by mouth once daily  #90 x 3   Entered and Authorized by:   Loreen Freud DO   Signed by:   Loreen Freud DO on 11/20/2007   Method used:   Historical   RxID:   [id number] COD LIVER OIL   CAPS (COD LIVER OIL) 1 by mouth once daily  #30 x 0   Entered and Authorized by:   Loreen Freud DO   Signed by:   Loreen Freud DO on 11/20/2007   Method used:   Historical   RxID:   [id number]  ]

## 2010-05-25 NOTE — Assessment & Plan Note (Signed)
Summary: bp check.cbs   Vital Signs:  Patient Profile:   50 Years Old Female Weight:      185.13 pounds Temp:     99.6 degrees F oral Pulse rate:   64 / minute Resp:     14 per minute BP sitting:   120 / 80  (right arm)  Pt. in pain?   no  Vitals Entered By: Ardyth Man (July 02, 2007 11:43 AM)                  Chief Complaint:  BP Check.  History of Present Illness: Patient recently laid off from [employer].  Otherwise, doing well. Needs refills on medication    Current Allergies: ! PENICILLIN  Past Medical History:    Reviewed history from 05/15/2006 and no changes required:       Allergic rhinitis  Past Surgical History:    Reviewed history from 10/31/2006 and no changes required:       fibroid surgery      Physical Exam  General:     Well-developed,well-nourished,in no acute distress; alert,appropriate and cooperative throughout examination Neck:     No deformities, masses, or tenderness noted. Lungs:     Normal respiratory effort, chest expands symmetrically. Lungs are clear to auscultation, no crackles or wheezes. Heart:     Normal rate and regular rhythm. S1 and S2 normal without gallop, murmur, click, rub or other extra sounds. Extremities:     No clubbing, cyanosis, edema, or deformity noted with normal full range of motion of all joints.      Impression & Recommendations:  Problem # 1:  HYPERTENSION, BENIGN ESSENTIAL (ICD-401.1) at goal f/u in 4 months and prn Her updated medication list for this problem includes:    Cartia Xt 300 Mg Cp24 (Diltiazem hcl coated beads) .Marland Kitchen... 1 by mouth qd    Lisinopril 10 Mg Tabs (Lisinopril) .Marland Kitchen... 1 by mouth qd  BP today: 120/80 Prior BP: 130/82 (05/27/2007)  Labs Reviewed: Creat: 0.9 (05/27/2007) Chol: 200 (05/27/2007)   HDL: 67.8 (05/27/2007)   LDL: 124 (05/27/2007)   TG: 41 (05/27/2007)  Orders: TLB-TSH (Thyroid Stimulating Hormone) (84443-TSH) Venipuncture ([address])   Complete Medication  List: 1)  Cartia Xt 300 Mg Cp24 (Diltiazem hcl coated beads) .Marland Kitchen.. 1 by mouth qd 2)  Lisinopril 10 Mg Tabs (Lisinopril) .Marland Kitchen.. 1 by mouth qd 3)  Magic Mouthwash  .... 1 tsp swish and spit qid prn     Prescriptions: LISINOPRIL 10 MG  TABS (LISINOPRIL) 1 by mouth qd  #90 x 3   Entered by:   Ardyth Man   Authorized by:   Leanne Chang MD   Signed by:   Ardyth Man on 07/02/2007   Method used:   Electronically sent to ...       [facility]       [address]       [place], [place]  [address]       Ph: [phone]       Fax: [phone]   RxID:   [id number] CARTIA XT 300 MG  CP24 (DILTIAZEM HCL COATED BEADS) 1 by mouth qd  #90 x 3   Entered by:   Ardyth Man   Authorized by:   Leanne Chang MD   Signed by:   Ardyth Man on 07/02/2007   Method used:   Electronically sent to ...       [facility]  [address]       [place], [place]  [address]       Ph: [phone]       Fax: [phone]   RxID:   [id number]  ]

## 2010-05-25 NOTE — Progress Notes (Signed)
Summary: note excercise class  Phone Note Call from Patient Call back at Work Phone [phone]   Summary of Call: dr Laury Axon pls advise on note pt needs a note stating it is ok for her to participate in the excercise class at [facility].Felecia Deloach CMA  May 07, 2008 9:39 AM   Follow-up for Phone Call        done Follow-up by: Loreen Freud DO,  May 07, 2008 11:06 AM  Additional Follow-up for Phone Call Additional follow up Details #1::        left detail message that note is ready for pick up.Felecia Deloach CMA  May 07, 2008 11:25 AM

## 2010-05-25 NOTE — Progress Notes (Signed)
     Follow-up for Phone Call       Follow-up by: Ardyth Man,  November 04, 2006 10:55 AM  Additional Follow-up for Phone Call Additional follow up Details #1::       Additional Follow-up by: Ardyth Man,  November 05, 2006 10:48 AM    Additional Follow-up for Phone Call Additional follow up Details #2::    I spoke to pt. and she doesn't eat licorice, I scheduled her potassium check for next monday ...................................................................Ardyth Man  November 05, 2006 1:33 PM  Follow-up by: Ardyth Man,  November 05, 2006 1:33 PM

## 2010-05-25 NOTE — Assessment & Plan Note (Signed)
Summary: ROV 3 MONTHS,CBS   Vital Signs:  Patient Profile:   50 Years Old Female Weight:      194 pounds Temp:     98.1 degrees F oral Pulse rate:   64 / minute Resp:     14 per minute BP sitting:   120 / 80  (right arm)  Pt. in pain?   no  Vitals Entered By: Ardyth Man (October 31, 2006 10:09 AM)                Chief Complaint:  BP check.  History of Present Illness:  Hypertension Follow-Up      This is a 50 year old woman who presents for Hypertension follow-up.  The patient denies lightheadedness, urinary frequency, headaches, and fatigue.  The patient denies the following associated symptoms: chest pain, chest pressure, dyspnea, leg edema, and pedal edema.  Compliance with medications (by patient report) has been near 100%.  The patient reports that dietary compliance has been good.  The patient reports exercising 3-4X per week. Patient stopped Benicar about 3 months ago.Has been only on Cartia 300mg .  Ms.Lesleigh Noe  also complains of low back pain for the last one week.Patient is very physically active.  She denies any radiation of her pain.  She she denies any bowel or bladder dysfunction.  Pain is not severe.  She denies any dysuria or urinary frequency.    Past Surgical History:    fibroid surgery   Family History:    family history of hypertension, and diabetes  Social History:    patient type no electronics.    Single    denies alcohol and tobacco use.    Denies drug use     Physical Exam  General:     pleasant female, in no acute distress Lungs:     Normal respiratory effort, chest expands symmetrically. Lungs are clear to auscultation, no crackles or wheezes. Heart:     Normal rate and regular rhythm. S1 and S2 normal without gallop, murmur, click, rub or other extra sounds. Msk:     there is no midline spine tenderness.  Chest mild paraspinal tenderness in the lumbar lower thoracic area.  Bilateral straight leg raise causes mild discomfort.  Patient  able to walk on her heels and toes without difficulties Extremities:     No clubbing, cyanosis, edema, or deformity noted with normal full range of motion of all joints.   Neurologic:     deep tendon reflex were 2+ and equal bilaterally.  No focal sensory, motor deficits were noted.    Impression & Recommendations:  Problem # 1:  HYPERTENSION, BENIGN ESSENTIAL (ICD-401.1) at goal. Continue current medication and monitor blood pressure regularly. If blood pressure higher than 140/90,  Please follow-up. schedule follow-up in 3 to 4 months The following medications were removed from the medication list:    Hydrochlorothiazide 25 Mg Tabs (Hydrochlorothiazide)    Benicar Hct 40-12.5 Mg Tabs (Olmesartan medoxomil-hctz) .Marland Kitchen... Take 1 tablet by mouth once a day    Cartia Xt 240 Mg Cp24 (Diltiazem hcl coated beads)  Her updated medication list for this problem includes:    Dilt-cd 300 Mg Cp24 (Diltiazem hcl coated beads) .Marland Kitchen... Take 1 capsule by mouth once a day  Orders: TLB-BMP (Basic Metabolic Panel-BMET) (80048-METABOL)   Problem # 2:  BACK PAIN (ICD-724.5) mechanical low back pain. Recommended over-the-counter Tylenol for the next 5 to 7 days. Advised patient that if her symptoms don't improve or if they worsen, she  is to follow-up         Laboratory Results   Urine Tests    Routine Urinalysis   Glucose: negative   (Normal Range: Negative) Bilirubin: negative   (Normal Range: Negative) Ketone: negative   (Normal Range: Negative) Spec. Gravity: <1.005   (Normal Range: 1.003-1.035) Blood: negative   (Normal Range: Negative) pH: 5.0   (Normal Range: 5.0-8.0) Protein: negative   (Normal Range: Negative) Urobilinogen: negative   (Normal Range: 0-1) Nitrite: negative   (Normal Range: Negative) Leukocyte Esterace: negative   (Normal Range: Negative)    Comments: ..................................................................Marland KitchenDoristine Devoid  October 31, 2006 2:46 PM

## 2010-05-25 NOTE — Progress Notes (Signed)
Summary: ALEJ-BP IS HIGH  Phone Note Call from Patient   Caller: Patient Summary of Call: PT CALLING ABOUT HIS BP IS HIGH LIKE TO BE SEE SOON CALL PT AT [phone] Initial call taken by: Freddy Jaksch,  December 04, 2006 3:10 PM  Follow-up for Phone Call        SPOKE WITH PT SAID THAT BP HAS ABOUT 150/100 SHE WAS HAVING HEADACHES BUT THOUGHT IT JUST MIGHT ME SINUS RELATED SCHEDULED OV FOR TOMORROW Follow-up by: Doristine Devoid,  December 04, 2006 3:41 PM

## 2010-05-25 NOTE — Letter (Signed)
Summary: Results Follow-up Letter  [place] at [facility]  [address] [place], [place] [address]   Phone: [phone]  Fax: [phone]    11/17/2007        Angela Garner [address] [place], [place]  [address]  Dear [name],   The following are the results of your recent test(s):  Test     Result     Pap Smear    Normal_______  Not Normal_____       Comments: _________________________________________________________ Cholesterol LDL(Bad cholesterol):          Your goal is less than:         HDL (Good cholesterol):        Your goal is more than: _________________________________________________________ Other Tests:   _________________________________________________________  Please call for an appointment Or _Please see attached lab_report and call office to schedule labs.______________________________________________________ _________________________________________________________ _________________________________________________________  Sincerely,  Ardyth Man [place] at [facility]

## 2010-05-25 NOTE — Letter (Signed)
Summary: Results Follow-up Letter  [place] at [facility]  [address] [place], [place] [address]   Phone: [phone]  Fax: [phone]    05/28/2007        Angela Garner [address] [place], [place]  [address]  Dear [name],   The following are the results of your recent test(s):  Test     Result     Pap Smear    Normal_______  Not Normal_____       Comments: _________________________________________________________ Cholesterol LDL(Bad cholesterol):          Your goal is less than:         HDL (Good cholesterol):        Your goal is more than: _________________________________________________________ Other Tests:   _________________________________________________________  Please call for an appointment Or _Please see attached.________________________________________________________ _________________________________________________________ _________________________________________________________  Sincerely,  Ardyth Man [place] at [facility]

## 2010-05-25 NOTE — Assessment & Plan Note (Signed)
Summary: 1 WEEK ROA PER DR Melika Reder//[facility]   Vital Signs:  Patient Profile:   50 Years Old Female Weight:      199 pounds Temp:     98.7 degrees F oral Pulse rate:   64 / minute Resp:     14 per minute BP sitting:   120 / 88  (right arm)  Pt. in pain?   no  Vitals Entered By: Ardyth Man (January 01, 2007 11:50 AM)                  Chief Complaint:  BP Check.  History of Present Illness: Tolerating medication well.no side effects. Denies chest pain/sob/DOE. Feels great.        Physical Exam  General:     Well-developed,well-nourished,in no acute distress; alert,appropriate and cooperative throughout examination Neck:     No deformities, masses, or tenderness noted. Lungs:     Normal respiratory effort, chest expands symmetrically. Lungs are clear to auscultation, no crackles or wheezes. Heart:     Normal rate and regular rhythm. S1 and S2 normal without gallop, murmur, click, rub or other extra sounds. Extremities:     No clubbing, cyanosis, edema, or deformity noted with normal full range of motion of all joints.      Impression & Recommendations:  Problem # 1:  HYPERTENSION, BENIGN ESSENTIAL (ICD-401.1) improved Her updated medication list for this problem includes:    Cartia Xt 300 Mg Cp24 (Diltiazem hcl coated beads) .Marland Kitchen... 1 by mouth qd    Lisinopril 10 Mg Tabs (Lisinopril) .Marland Kitchen... 1 by mouth qd  Orders: TLB-BMP (Basic Metabolic Panel-BMET) (80048-METABOL)   Complete Medication List: 1)  Cartia Xt 300 Mg Cp24 (Diltiazem hcl coated beads) .Marland Kitchen.. 1 by mouth qd 2)  Lisinopril 10 Mg Tabs (Lisinopril) .Marland Kitchen.. 1 by mouth qd   Patient Instructions: 1)  Please schedule a follow-up appointment in 3 months. 2)  Check your Blood Pressure regularly. If it is above:140/90 you should make an appointment.    ]

## 2010-05-25 NOTE — Progress Notes (Signed)
  Phone Note Outgoing Call Call back at [facility] Phone [phone]   Call placed by: Ardyth Man,  April 05, 2007 10:18 AM Call placed to: Patient Summary of Call: lEFT MESSAGE FOR PATIENT AGAIN AS TO HER BP AND MONITORING. ...................................................................Ardyth Man  April 05, 2007 10:18 AM

## 2010-05-25 NOTE — Miscellaneous (Signed)
Summary: ABDOMINAL ULTRASOUND  ABDOMINAL ULTRASOUND   Imported By: Freddy Jaksch 03/14/2007 15:22:08  _____________________________________________________________________  External Attachment:    Type:   Image     Comment:   External Document  Appended Document: ABDOMINAL ULTRASOUND advise u/s overall okay. Should be feeling better by ow. If not, would refer to G.[facility].

## 2010-05-25 NOTE — Letter (Signed)
Summary: Results Follow-up Letter  [place] at [facility]  [address] [place], [place] [address]   Phone: [phone]  Fax: [phone]    12/09/2006         Angela Garner [address] [place], [place]  [address]  Dear Ms. AFARI,   The following are the results of your recent test(s):  Test     Result     Pap Smear    Normal_______  Not Normal_____       Comments: _________________________________________________________ Cholesterol LDL(Bad cholesterol):          Your goal is less than:         HDL (Good cholesterol):        Your goal is more than: _________________________________________________________ Other Tests:   _________________________________________________________  Please call for an appointment Or _Please see attached.________________________________________________________ _________________________________________________________ _________________________________________________________  Sincerely,  Ardyth Man [place] at [facility]

## 2010-05-25 NOTE — Progress Notes (Signed)
  Phone Note Outgoing Call Call back at [facility] Phone [phone]   Call placed by: Ardyth Man,  February 28, 2007 5:54 PM Call placed to: Patient Summary of Call: [facility] and mailed lab letter ...................................................................Ardyth Man  February 28, 2007 5:55 PM

## 2010-05-25 NOTE — Letter (Signed)
Summary: Generic Letter  [place] at [place]  [address] [place], [place] [address]   Phone: [phone]  Fax: [phone]    05/07/2008   Re: Angela Garner [address] [place], [place]  [address]  To Whom It May Concern:  The above pt may participate in the [facility] exercise program. Feel free to call with any further questions.              Sincerely,   Loreen Freud DO  at [facility]

## 2010-05-25 NOTE — Progress Notes (Signed)
  Phone Note Outgoing Call Call back at [facility] Phone [phone]   Call placed by: Ardyth Man,  February 03, 2007 9:17 AM Call placed to: Patient Summary of Call: See previous phone note. ...................................................................Ardyth Man  February 03, 2007 9:17 AM

## 2010-05-25 NOTE — Letter (Signed)
Summary: Results Follow-up Letter  [place] at [facility]  [address] [place], [place] [address]   Phone: [phone]  Fax: [phone]    02/28/2007        Angela Garner [address] [place], [place]  [address]  Dear [name],   The following are the results of your recent test(s):  Test     Result     Pap Smear    Normal_______  Not Normal_____       Comments: _________________________________________________________ Cholesterol LDL(Bad cholesterol):          Your goal is less than:         HDL (Good cholesterol):        Your goal is more than: _________________________________________________________ Other Tests:   _________________________________________________________  Please call for an appointment Or Please see attached._________________________________________________________ _________________________________________________________ _________________________________________________________  Sincerely,  Ardyth Man [place] at [facility]

## 2010-05-25 NOTE — Assessment & Plan Note (Signed)
Summary: roa 3 months,cbs   Vital Signs:  Patient Profile:   50 Years Old Female Weight:      187.25 pounds Temp:     98.0 degrees F oral Pulse rate:   60 / minute Resp:     14 per minute BP sitting:   120 / 88  (right arm)  Pt. in pain?   no  Vitals Entered By: Ardyth Man (January 30, 2007 10:07 AM)                  Chief Complaint:  3 month f/u BP.  History of Present Illness:  Hypertension Follow-Up      This is a 50 year old woman who presents for Hypertension follow-up.  The patient denies lightheadedness, urinary frequency, headaches, and edema.  The patient denies the following associated symptoms: chest pain, chest pressure, and exercise intolerance.  Compliance with medications (by patient report) has been near 100%.  The patient reports that dietary compliance has been excellent.  The patient reports exercising daily.    Also complaints of chronic right knee pain. States not severe, but more of an annoyance. Patient still able to exercise. Pain located in joint space. Rarely knee feels like it wants to give out on her. Uses a knee brace for support during exercise        Physical Exam  General:     Well-developed,well-nourished,in no acute distress; alert,appropriate and cooperative throughout examination Lungs:     Normal respiratory effort, chest expands symmetrically. Lungs are clear to auscultation, no crackles or wheezes. Heart:     Normal rate and regular rhythm. S1 and S2 normal without gallop, murmur, click, rub or other extra sounds. Msk:     Examination of righ knee is significant for no obvious deformities. No swelling. Mild tenderness in joint space. Negative valgus/varus strain. Negative anterior/posterior drawer test. Gait normal Extremities:     No clubbing, cyanosis, edema, or deformity noted with normal full range of motion of all joints.      Impression & Recommendations:  Problem # 1:  HYPERTENSION, BENIGN ESSENTIAL (ICD-401.1)  Stable F/u in  3months and as needed  Her updated medication list for this problem includes:    Cartia Xt 300 Mg Cp24 (Diltiazem hcl coated beads) .Marland Kitchen... 1 by mouth qd    Lisinopril 10 Mg Tabs (Lisinopril) .Marland Kitchen... 1 by mouth qd  Orders: TLB-Lipid Panel (80061-LIPID)  BP today: 120/88 Prior BP: 120/88 (01/01/2007)  Labs Reviewed: Creat: 0.8 (01/01/2007)   Problem # 2:  KNEE PAIN (ZOX-096.04) Further recommendation after review of x-ray Orders: Radiology other (Radiology Other)   Complete Medication List: 1)  Cartia Xt 300 Mg Cp24 (Diltiazem hcl coated beads) .Marland Kitchen.. 1 by mouth qd 2)  Lisinopril 10 Mg Tabs (Lisinopril) .Marland Kitchen.. 1 by mouth qd     ]

## 2010-05-25 NOTE — Progress Notes (Signed)
Summary: note for yoga class  Phone Note Call from Patient Call back at [facility] Phone [phone]   Caller: Patient Summary of Call: patient is taking a yoga class and they want her to have a doctor's note stating that she can participate. would like to pick up tomorrow Initial call taken by: Doristine Devoid,  December 22, 2007 2:44 PM  Follow-up for Phone Call        Dr. Laury Axon,  Please advise. Ardyth Man  December 22, 2007 2:51 PM  Follow-up by: Ardyth Man,  December 22, 2007 2:51 PM  Additional Follow-up for Phone Call Additional follow up Details #1::        Ok for patient to participate in yoga. Additional Follow-up by: Loreen Freud DO,  December 22, 2007 3:15 PM    Additional Follow-up for Phone Call Additional follow up Details #2::    Letter placed up front for pick up and patient aware Ardyth Man  December 22, 2007 3:26 PM  Follow-up by: Ardyth Man,  December 22, 2007 3:26 PM

## 2010-05-25 NOTE — Progress Notes (Signed)
Summary: [facility]---RX  Phone Note Refill Request   Refills Requested: Medication #1:  BENAZEPRIL HCL 20 MG TABS Take one tablet daily.  Medication #2:  LISINOPRIL 10 MG  TABS 1 by mouth once daily [facility] [address]--[phone]  Initial call taken by: Freddy Jaksch,  August 06, 2008 9:38 AM      Prescriptions: LISINOPRIL 10 MG  TABS (LISINOPRIL) 1 by mouth once daily  #90 x 1   Entered by:   Shonna Chock   Authorized by:   Loreen Freud DO   Signed by:   Shonna Chock on 08/06/2008   Method used:   Electronically to        [facility] (retail)       [address]       [place], [place]  [address]       Ph: [phone]       Fax: [phone]   RxID:   [id number] BENAZEPRIL HCL 20 MG TABS (BENAZEPRIL HCL) Take one tablet daily.  #30 Each x 2   Entered by:   Shonna Chock   Authorized by:   Loreen Freud DO   Signed by:   Shonna Chock on 08/06/2008   Method used:   Electronically to        [facility] (retail)       [address]       [place], [place]  [address]       Ph: [phone]       Fax: [phone]   RxID:   [id number]

## 2010-05-25 NOTE — Progress Notes (Signed)
Summary: note for school  Phone Note Call from Patient   Summary of Call: pt needs note stating that it is ok to participate in a yoga class while on BP meds................Marland KitchenFelecia Deloach CMA  December 10, 2008 5:03 PM  Follow-up for Phone Call        That is fine to give her a note that she can do yoga while on bp meds. Follow-up by: Loreen Freud DO,  December 10, 2008 5:12 PM  Additional Follow-up for Phone Call Additional follow up Details #1::        pt aware note ready...........Marland KitchenFelecia Deloach CMA  December 10, 2008 5:33 PM

## 2010-05-25 NOTE — Letter (Signed)
Summary: Results Follow-up Letter  [place] at [facility]  [address] [place], [place] [address]   Phone: [phone]  Fax: [phone]    07/03/2007        Rosealyn A. Afari [address] [place], [place]  [address]  Dear Ms. AFARI,   The following are the results of your recent test(s):  Test     Result     Pap Smear    Normal_______  Not Normal_____       Comments: _________________________________________________________ Cholesterol LDL(Bad cholesterol):          Your goal is less than:         HDL (Good cholesterol):        Your goal is more than: _________________________________________________________ Other Tests:   _________________________________________________________  Please call for an appointment Or _Please see attached.________________________________________________________ _________________________________________________________ _________________________________________________________  Sincerely,  Ardyth Man [place] at [facility]

## 2010-05-25 NOTE — Progress Notes (Signed)
  Phone Note Outgoing Call Call back at [facility] Phone [phone]   Call placed by: Ardyth Man,  April 04, 2007 6:07 PM Call placed to: Patient Summary of Call: Per Dr. Blossom Hoops pt. is to take Diltiazem 120 one and one-half tablets twice daily #90 for $10.00 at [facility]. Left message for pt. that the rx is sent electronically to [facility] on [address] for $10.00. ...................................................................Ardyth Man  April 04, 2007 6:10 PM     New/Updated Medications: DILTIAZEM HCL 120 MG  TABS (DILTIAZEM HCL) Take one and one half tablets twice daily as directed   Prescriptions: DILTIAZEM HCL 120 MG  TABS (DILTIAZEM HCL) Take one and one half tablets twice daily as directed  #90 x 0   Entered by:   Ardyth Man   Authorized by:   Leanne Chang MD   Signed by:   Ardyth Man on 04/04/2007   Method used:   Electronically sent to ...       [facility] [address]       [address]       [place], [place]  [address]       Ph: [phone]       Fax: [phone]   RxID:   [id number]

## 2010-05-25 NOTE — Assessment & Plan Note (Signed)
Summary: ROA/CDJ   Vital Signs:  Patient Profile:   50 Years Old Female Weight:      195.38 pounds Pulse rate:   60 / minute BP sitting:   140 / 100  Vitals Entered By: Kandice Hams (November 05, 2007 11:30 AM)                 PCP:  Laury Axon  Chief Complaint:  rov bp .  History of Present Illness:  Hypertension Follow-Up      This is a 50 year old woman who presents for Hypertension follow-up.  The patient denies lightheadedness, urinary frequency, headaches, edema, impotence, rash, and fatigue.  The patient denies the following associated symptoms: chest pain, chest pressure, exercise intolerance, dyspnea, palpitations, syncope, leg edema, and pedal edema.  Compliance with medications (by patient report) has been near 100%.  The patient reports that dietary compliance has been good.  The patient reports exercising daily.  Adjunctive measures currently used by the patient include salt restriction.  Pt didn't take medicine today because she was having labs done and was fasting---she thought she couldn't  her meds when she fasted.    Current Allergies: ! PENICILLIN  Past Medical History:    Reviewed history from 05/15/2006 and no changes required:       Allergic rhinitis       Hypertension  Past Surgical History:    Reviewed history from 10/31/2006 and no changes required:       fibroid surgery   Family History:    Reviewed history from 10/31/2006 and no changes required:       family history of hypertension, and diabetes  Social History:    Reviewed history from 10/31/2006 and no changes required:       patient type no electronics.       Single       denies alcohol and tobacco use.       Denies drug use    Review of Systems      See HPI   Physical Exam  General:     Well-developed,well-nourished,in no acute distress; alert,appropriate and cooperative throughout examination Neck:     No deformities, masses, or tenderness noted. Lungs:     Normal respiratory  effort, chest expands symmetrically. Lungs are clear to auscultation, no crackles or wheezes. Heart:     normal rate, regular rhythm, and no murmur.   Extremities:     No clubbing, cyanosis, edema, or deformity noted with normal full range of motion of all joints.   Skin:     Intact without suspicious lesions or rashes Cervical Nodes:     No lymphadenopathy noted Psych:     Cognition and judgment appear intact. Alert and cooperative with normal attention span and concentration. No apparent delusions, illusions, hallucinations    Impression & Recommendations:  Problem # 1:  HYPERTENSION (ICD-401.9) check labs The following medications were removed from the medication list:    Lisinopril 10 Mg Tabs (Lisinopril) .Marland Kitchen... 1 by mouth qd  Her updated medication list for this problem includes:    Cartia Xt 300 Mg Cp24 (Diltiazem hcl coated beads) .Marland Kitchen... 1 by mouth qd  BP today: 140/100 Prior BP: 120/80 (07/02/2007)  Labs Reviewed: Creat: 0.9 (05/27/2007) Chol: 200 (05/27/2007)   HDL: 67.8 (05/27/2007)   LDL: 124 (05/27/2007)   TG: 41 (05/27/2007)   Complete Medication List: 1)  Cartia Xt 300 Mg Cp24 (Diltiazem hcl coated beads) .Marland Kitchen.. 1 by mouth qd 2)  Magic Mouthwash  .Marland KitchenMarland KitchenMarland Kitchen  1 tsp swish and spit qid prn  Other Orders: Venipuncture (57846) TLB-Lipid Panel (80061-LIPID) TLB-BMP (Basic Metabolic Panel-BMET) (80048-METABOL) TLB-Hepatic/Liver Function Pnl (80076-HEPATIC)     ]

## 2010-05-25 NOTE — Assessment & Plan Note (Signed)
Summary: ACUTE ONLY--URI  CYD   Vital Signs:  Patient Profile:   50 Years Old Female Weight:      186 pounds Temp:     98.4 degrees F oral Pulse rate:   64 / minute Resp:     14 per minute BP sitting:   130 / 90  (right arm)  Pt. in pain?   no  Vitals Entered By: Ardyth Man (April 02, 2007 11:52 AM)                  Chief Complaint:  sinus pressure, post nasal drip, x 1 week, and URI symptoms.  History of Present Illness:  URI Symptoms      This is a 50 year old woman who presents with URI symptoms.  The symptoms began duration > 7 days ago.  The patient reports nasal congestion, sore throat, and sick contacts, but denies dry cough, productive cough, and earache.  The patient denies fever, stiff neck, dyspnea, and wheezing.  The patient also reports sneezing.  Risk factors for Strep sinusitis include unilateral facial pain and tooth pain.    Current Allergies: ! PENICILLIN      Physical Exam  General:     Well-developed,well-nourished,in no acute distress; alert,appropriate and cooperative throughout examination Ears:     External ear exam shows no significant lesions or deformities.  Otoscopic examination reveals clear canals, tympanic membranes are intact bilaterally without bulging, retraction, inflammation or discharge. Hearing is grossly normal bilaterally. Nose:     Swollen turbinates with yellowish nasal discharge: Right > left. Mild maxillary sinus tenderness Mouth:     Oral mucosa and oropharynx without lesions or exudates.  Teeth in good repair. Neck:     No deformities, masses, or tenderness noted. Lungs:     Normal respiratory effort, chest expands symmetrically. Lungs are clear to auscultation, no crackles or wheezes. Heart:     Normal rate and regular rhythm. S1 and S2 normal without gallop, murmur, click, rub or other extra sounds.    Impression & Recommendations:  Problem # 1:  SINUSITIS- ACUTE-NOS (ICD-461.9) Initially was given Rx  for Levopak, but given expense, prefers to try Zpak since it has worked in the past. Her updated medication list for this problem includes:    Zithromax Z-pak 250 Mg Tabs (Azithromycin) .Marland Kitchen... As directed Instructed on treatment. Call if symptoms persist or worsen.  Veramyst provided and instructions reviewed Magic mouthwash also given.  F/u if no improvement or worsens  Complete Medication List: 1)  Cartia Xt 300 Mg Cp24 (Diltiazem hcl coated beads) .Marland Kitchen.. 1 by mouth qd 2)  Lisinopril 10 Mg Tabs (Lisinopril) .Marland Kitchen.. 1 by mouth qd 3)  Zithromax Z-pak 250 Mg Tabs (Azithromycin) .... As directed 4)  Magic Mouthwash  .... 1 tsp swish and spit qid prn     Prescriptions: MAGIC MOUTHWASH 1 tsp swish and spit QID prn  #12 oz x 0   Entered and Authorized by:   Leanne Chang MD   Signed by:   Leanne Chang MD on 04/02/2007   Method used:   Print then Give to Patient   RxID:   [id number] ZITHROMAX Z-PAK 250 MG  TABS (AZITHROMYCIN) as directed  #1 x 0   Entered and Authorized by:   Leanne Chang MD   Signed by:   Leanne Chang MD on 04/02/2007   Method used:   Print then Give to Patient   RxID:   [id number]  ]

## 2010-05-25 NOTE — Progress Notes (Signed)
  Phone Note Outgoing Call   Summary of Call: [facility]- Loraine Leriche the pharmacist aware that [facility] is taking care of Ms. Afari's prescription and that he doesn't have to call the insurance company. Pharmacist aware and our office will call the insurance company about this issue on Monday Dec. 15, 2008 per Dr. Blossom Hoops ...................................................................Ardyth Man  April 04, 2007 6:15 PM Due to the inability to get this overwritten for the patient who is leaving to [place] on sunday, we had to change her medication to immediate release form of her medication in order to minimize possible complications of her being off her medication while in [place].  Since [facility] has a 10.00 prescription plan it was both cost effective and appropriate for the patient, per Dr. Blossom Hoops.  Follow-up for Phone Call        Unfortunately,  the patient doesn't have a vacation override according to the Pharmacist, Loraine Leriche, at the [facility] in [place].After advising the pharmacy Tech that we were not successful in contacting the patient insurance to get an override (It was after 5 pm),   I was very disappointed in the options given to me by the [facility] tech... patient can get her prescription picked up by a family member and mailed to [place] or she can get her prescription filled in [place]. Perhaps there was nothing  the pharmacist and the tech could do, but compassion for the patient's plight would have been a good start. I can only assume the her insurance company would have reconsidered the override if they were aware of  the patient's travel out of the country.  Follow-up by: Leanne Chang MD,  April 04, 2007 11:56 PM  Additional Follow-up for Phone Call Additional follow up Details #1::        Patient was advised by voicemail to monitor her blood pressure regularly. If elevated or too low, she should see a physician. Additional Follow-up by: Leanne Chang MD,  April 04, 2007 11:57 PM

## 2010-05-25 NOTE — Progress Notes (Signed)
  Phone Note Outgoing Call   Call placed by: Ardyth Man,  November 12, 2006 8:39 AM Call placed to: Patient Summary of Call: [facility] ...................................................................Ardyth Man  November 12, 2006 8:40 AM   Follow-up for Phone Call        I mailed copy of labs. ...................................................................Ardyth Man  November 13, 2006 2:25 PM  Follow-up by: Ardyth Man,  November 13, 2006 2:25 PM

## 2010-05-25 NOTE — Progress Notes (Signed)
  Phone Note Outgoing Call Call back at [facility] Phone [phone]   Call placed by: Ardyth Man,  March 17, 2007 12:59 PM Call placed to: Patient Summary of Call: Left message for patient to call back to find out how she is doing and to let her know that the ultra sound came back fine and if she is not feeling better Dr. Blossom Hoops will refer her to Gastroenterology. ...................................................................Ardyth Man  March 17, 2007 1:00 PM   Follow-up for Phone Call        Left message and mailed lab letter ...................................................................Ardyth Man  March 21, 2007 10:31 AM  Follow-up by: Ardyth Man,  March 21, 2007 10:31 AM

## 2010-05-25 NOTE — Letter (Signed)
Summary: Results Follow-up Letter  [place] [facility]  [address] [place], [place] [address]   Phone: [phone]  Fax: [phone]    03/21/2007        Angela Garner [address] [place], [place]  [address]  Dear [name],   The following are the results of your recent test(s):  Test     Result     Pap Smear    Normal_______  Not Normal_____       Comments: _________________________________________________________ Cholesterol LDL(Bad cholesterol):          Your goal is less than:         HDL (Good cholesterol):        Your goal is more than: _________________________________________________________ Other Tests:   _________________________________________________________  Please call for an appointment Or __Please see attached._______________________________________________________ _________________________________________________________ _________________________________________________________  Sincerely,  Ardyth Man [place] at [facility]

## 2011-08-02 ENCOUNTER — Ambulatory Visit (INDEPENDENT_AMBULATORY_CARE_PROVIDER_SITE_OTHER): Payer: BC Managed Care – PPO | Admitting: Family Medicine

## 2011-08-02 ENCOUNTER — Encounter: Payer: Self-pay | Admitting: Family Medicine

## 2011-08-02 VITALS — BP 114/80 | HR 66 | Temp 97.8°F | Ht 68.0 in | Wt 207.2 lb

## 2011-08-02 DIAGNOSIS — J40 Bronchitis, not specified as acute or chronic: Secondary | ICD-10-CM

## 2011-08-02 DIAGNOSIS — J329 Chronic sinusitis, unspecified: Secondary | ICD-10-CM

## 2011-08-02 DIAGNOSIS — Z0000 Encounter for general adult medical examination without abnormal findings: Secondary | ICD-10-CM

## 2011-08-02 LAB — LIPID PANEL
Cholesterol: 201 mg/dL — ABNORMAL HIGH (ref 0–200)
HDL: 58.9 mg/dL (ref >39.00)
Total CHOL/HDL Ratio: 3
Triglycerides: 103 mg/dL (ref 0.0–149.0)
VLDL: 20.6 mg/dL (ref 0.0–40.0)

## 2011-08-02 LAB — CBC WITH DIFFERENTIAL/PLATELET
Basophils Absolute: 0 10*3/uL (ref 0.0–0.1)
Basophils Relative: 0.6 % (ref 0.0–3.0)
Eosinophils Absolute: 0.2 10*3/uL (ref 0.0–0.7)
Eosinophils Relative: 4.6 % (ref 0.0–5.0)
HCT: 43.3 % (ref 36.0–46.0)
Hemoglobin: 14.5 g/dL (ref 12.0–15.0)
Lymphocytes Relative: 54.3 % — ABNORMAL HIGH (ref 12.0–46.0)
Lymphs Abs: 2.8 10*3/uL (ref 0.7–4.0)
MCHC: 33.5 g/dL (ref 30.0–36.0)
MCV: 93 fl (ref 78.0–100.0)
Monocytes Absolute: 0.7 10*3/uL (ref 0.1–1.0)
Monocytes Relative: 13.5 % — ABNORMAL HIGH (ref 3.0–12.0)
Neutro Abs: 1.4 10*3/uL (ref 1.4–7.7)
Neutrophils Relative %: 27 % — ABNORMAL LOW (ref 43.0–77.0)
Platelets: 190 10*3/uL (ref 150.0–400.0)
RBC: 4.66 Mil/uL (ref 3.87–5.11)
RDW: 13.9 % (ref 11.5–14.6)
WBC: 5.2 10*3/uL (ref 4.5–10.5)

## 2011-08-02 LAB — BASIC METABOLIC PANEL
BUN: 11 mg/dL (ref 6–23)
CO2: 30 mEq/L (ref 19–32)
Calcium: 9.4 mg/dL (ref 8.4–10.5)
Chloride: 101 mEq/L (ref 96–112)
Creatinine, Ser: 0.7 mg/dL (ref 0.4–1.2)
GFR: 114.97 mL/min (ref >60.00)
Glucose, Bld: 119 mg/dL — ABNORMAL HIGH (ref 70–99)
Potassium: 3.1 mEq/L — ABNORMAL LOW (ref 3.5–5.1)
Sodium: 141 mEq/L (ref 135–145)

## 2011-08-02 LAB — POCT URINALYSIS DIPSTICK
Bilirubin, UA: NEGATIVE
Blood, UA: NEGATIVE
Glucose, UA: NEGATIVE
Ketones, UA: NEGATIVE
Leukocytes, UA: NEGATIVE
Nitrite, UA: NEGATIVE
Protein, UA: NEGATIVE
Spec Grav, UA: <=1.005
Urobilinogen, UA: 0.2
pH, UA: 8

## 2011-08-02 LAB — HEPATIC FUNCTION PANEL
ALT: 20 U/L (ref 0–35)
AST: 22 U/L (ref 0–37)
Albumin: 4.3 g/dL (ref 3.5–5.2)
Alkaline Phosphatase: 67 U/L (ref 39–117)
Bilirubin, Direct: 0 mg/dL (ref 0.0–0.3)
Total Bilirubin: 0.5 mg/dL (ref 0.3–1.2)
Total Protein: 7.4 g/dL (ref 6.0–8.3)

## 2011-08-02 LAB — LDL CHOLESTEROL, DIRECT: Direct LDL: 120.9 mg/dL

## 2011-08-02 LAB — TSH: TSH: 1.36 u[IU]/mL (ref 0.35–5.50)

## 2011-08-02 MED ORDER — GUAIFENESIN-CODEINE 100-10 MG/5ML PO SYRP: ORAL_SOLUTION | ORAL | Status: DC

## 2011-08-02 MED ORDER — CLARITHROMYCIN ER 500 MG PO TB24: 1000.0000 mg | ORAL_TABLET | Freq: Every day | ORAL | Status: AC

## 2011-08-02 MED ORDER — AZELASTINE-FLUTICASONE 137-50 MCG/ACT NA SUSP: 1.0000 | Freq: Two times a day (BID) | NASAL | Status: DC

## 2011-08-02 MED ORDER — FLUTICASONE-SALMETEROL 250-50 MCG/DOSE IN AEPB: 1.0000 | INHALATION_SPRAY | Freq: Two times a day (BID) | RESPIRATORY_TRACT | Status: DC

## 2011-08-02 MED ORDER — ALBUTEROL SULFATE (5 MG/ML) 0.5% IN NEBU
2.5000 mg | INHALATION_SOLUTION | Freq: Once | RESPIRATORY_TRACT | Status: AC
  Administered 2011-08-02: 2.5 mg via RESPIRATORY_TRACT

## 2011-08-02 NOTE — Progress Notes (Signed)
Addended by: Arnette Norris on: 08/02/2011 03:35 PM   Modules accepted: Orders

## 2011-08-02 NOTE — Progress Notes (Signed)
  Subjective:     Angela Garner is a 51 y.o. female who presents for evaluation of sinus pain. Symptoms include: congestion, cough, facial pain, nasal congestion, purulent rhinorrhea, sinus pressure and productive cough with wheezing. Onset of symptoms was 2 weeks ago. Symptoms have been gradually worsening since that time. Past history is significant for no history of pneumonia or bronchitis. Patient is a non-smoker.  The following portions of the patient's history were reviewed and updated as appropriate: allergies, current medications, past family history, past medical history, past social history, past surgical history and problem list.  Review of System Pertinent items are noted in HPI.   Objective:    BP 114/80  Pulse 66  Temp(Src) 97.8 F (36.6 C) (Oral)  Ht 5\' 8"  (1.727 m)  Wt 207 lb 3.2 oz (93.985 kg)  BMI 31.50 kg/m2  SpO2 98% General appearance: alert, cooperative, appears stated age and mild distress Ears: normal TM's and external ear canals both ears Nose: green discharge, moderate congestion, sinus tenderness bilateral Throat: abnormal findings: mild oropharyngeal erythema and pnd Neck: moderate anterior cervical adenopathy, supple, symmetrical, trachea midline and thyroid not enlarged, symmetric, no tenderness/mass/nodules Lungs: rhonchi bilaterally and wheezes bilaterally Heart: regular rate and rhythm, S1, S2 normal, no murmur, click, rub or gallop Extremities: extremities normal, atraumatic, no cyanosis or edema    Assessment:      1. Acute bacterial  Sinusitis 2. Bronchitis.        Plan:    Nasal steroids per medication orders. Biaxin per medication orders. cheratussin for night time and mucinex for day

## 2011-08-02 NOTE — Patient Instructions (Signed)
Sinusitis  Sinuses are air pockets within the bones of your face. The growth of bacteria within a sinus leads to infection. The infection prevents the sinuses from draining. This infection is called sinusitis.  SYMPTOMS   There will be different areas of pain depending on which sinuses have become infected.   The maxillary sinuses often produce pain beneath the eyes.   Frontal sinusitis may cause pain in the middle of the forehead and above the eyes.  Other problems (symptoms) include:   Toothaches.   Colored, pus-like (purulent) drainage from the nose.   Swelling, warmth, and tenderness over the sinus areas may be signs of infection.  TREATMENT   Sinusitis is most often determined by an exam.X-rays may be taken. If x-rays have been taken, make sure you obtain your results or find out how you are to obtain them. Your caregiver may give you medications (antibiotics). These are medications that will help kill the bacteria causing the infection. You may also be given a medication (decongestant) that helps to reduce sinus swelling.   HOME CARE INSTRUCTIONS    Only take over-the-counter or prescription medicines for pain, discomfort, or fever as directed by your caregiver.   Drink extra fluids. Fluids help thin the mucus so your sinuses can drain more easily.   Applying either moist heat or ice packs to the sinus areas may help relieve discomfort.   Use saline nasal sprays to help moisten your sinuses. The sprays can be found at your local drugstore.  SEEK IMMEDIATE MEDICAL CARE IF:   You have a fever.   You have increasing pain, severe headaches, or toothache.   You have nausea, vomiting, or drowsiness.   You develop unusual swelling around the face or trouble seeing.  MAKE SURE YOU:    Understand these instructions.   Will watch your condition.   Will get help right away if you are not doing well or get worse.  Document Released: 04/09/2005 Document Revised: 03/29/2011 Document Reviewed:  11/06/2006  ExitCare Patient Information 2012 [employer]    Bronchitis  Bronchitis is the body's way of reacting to injury and/or infection (inflammation) of the bronchi. Bronchi are the air tubes that extend from the windpipe into the lungs. If the inflammation becomes severe, it may cause shortness of breath.  CAUSES   Inflammation may be caused by:   A virus.   Germs (bacteria).   Dust.   Allergens.   Pollutants and many other irritants.  The cells lining the bronchial tree are covered with tiny hairs (cilia). These constantly beat upward, away from the lungs, toward the mouth. This keeps the lungs free of pollutants. When these cells become too irritated and are unable to do their job, mucus begins to develop. This causes the characteristic cough of bronchitis. The cough clears the lungs when the cilia are unable to do their job. Without either of these protective mechanisms, the mucus would settle in the lungs. Then you would develop pneumonia.  Smoking is a common cause of bronchitis and can contribute to pneumonia. Stopping this habit is the single most important thing you can do to help yourself.  TREATMENT    Your caregiver may prescribe an antibiotic if the cough is caused by bacteria. Also, medicines that open up your airways make it easier to breathe. Your caregiver may also recommend or prescribe an expectorant. It will loosen the mucus to be coughed up. Only take over-the-counter or prescription medicines for pain, discomfort, or fever as directed   by your caregiver.   Removing whatever causes the problem (smoking, for example) is critical to preventing the problem from getting worse.   Cough suppressants may be prescribed for relief of cough symptoms.   Inhaled medicines may be prescribed to help with symptoms now and to help prevent problems from returning.   For those with recurrent (chronic) bronchitis, there may be a need for steroid medicines.  SEEK IMMEDIATE MEDICAL CARE IF:     During treatment, you develop more pus-like mucus (purulent sputum).   You have a fever.   Your baby is older than 3 months with a rectal temperature of 102 F (38.9 C) or higher.   Your baby is 3 months old or younger with a rectal temperature of 100.4 F (38 C) or higher.   You become progressively more ill.   You have increased difficulty breathing, wheezing, or shortness of breath.  It is necessary to seek immediate medical care if you are elderly or sick from any other disease.  MAKE SURE YOU:    Understand these instructions.   Will watch your condition.   Will get help right away if you are not doing well or get worse.  Document Released: 04/09/2005 Document Revised: 03/29/2011 Document Reviewed: 02/17/2008  [employer] Patient Information 2012 [employer].

## 2011-08-03 MED ORDER — POTASSIUM CHLORIDE CRYS ER 20 MEQ PO TBCR: 20.0000 meq | EXTENDED_RELEASE_TABLET | Freq: Every day | ORAL | Status: DC

## 2011-08-23 ENCOUNTER — Ambulatory Visit (INDEPENDENT_AMBULATORY_CARE_PROVIDER_SITE_OTHER): Payer: BC Managed Care – PPO | Admitting: Family Medicine

## 2011-08-23 ENCOUNTER — Encounter: Payer: Self-pay | Admitting: Family Medicine

## 2011-08-23 VITALS — BP 122/74 | HR 77 | Temp 98.4°F | Wt 209.2 lb

## 2011-08-23 DIAGNOSIS — E876 Hypokalemia: Secondary | ICD-10-CM

## 2011-08-23 DIAGNOSIS — J40 Bronchitis, not specified as acute or chronic: Secondary | ICD-10-CM

## 2011-08-23 LAB — BASIC METABOLIC PANEL
BUN: 13 mg/dL (ref 6–23)
CO2: 31 mEq/L (ref 19–32)
Calcium: 9.7 mg/dL (ref 8.4–10.5)
Chloride: 99 mEq/L (ref 96–112)
Creatinine, Ser: 0.6 mg/dL (ref 0.4–1.2)
GFR: 125.37 mL/min (ref >60.00)
Glucose, Bld: 82 mg/dL (ref 70–99)
Potassium: 3.4 mEq/L — ABNORMAL LOW (ref 3.5–5.1)
Sodium: 140 mEq/L (ref 135–145)

## 2011-08-23 MED ORDER — LEVOFLOXACIN 500 MG PO TABS: 500.0000 mg | ORAL_TABLET | Freq: Every day | ORAL | Status: AC

## 2011-08-23 MED ORDER — GUAIFENESIN-CODEINE 100-10 MG/5ML PO SYRP: ORAL_SOLUTION | ORAL | Status: DC

## 2011-08-23 NOTE — Progress Notes (Signed)
  Subjective:     Angela Garner is a 51 y.o. female here for evaluation of a cough. Onset of symptoms was 4 weeks ago. Symptoms have been improved some  since last visit but not completely and is mostly a productive cough.  The cough is productive and is aggravated by exercise and reclining position. Associated symptoms include: shortness of breath, sputum production and wheezing. Patient does not have a history of asthma. Patient does not have a history of environmental allergens. Patient has not traveled recently. Patient does not have a history of smoking. Patient has not had a previous chest x-ray. Patient has not had a PPD done.  The following portions of the patient's history were reviewed and updated as appropriate: allergies, current medications, past family history, past medical history, past social history, past surgical history and problem list.  Review of Systems Pertinent items are noted in HPI.    Objective:    Oxygen saturation 97% on room air BP 122/74  Pulse 77  Temp(Src) 98.4 F (36.9 C) (Oral)  Wt 209 lb 3.2 oz (94.892 kg)  SpO2 97% General appearance: alert, cooperative, appears stated age and no distress Ears: normal TM's and external ear canals both ears Nose: Nares normal. Septum midline. Mucosa normal. No drainage or sinus tenderness. Throat: lips, mucosa, and tongue normal; teeth and gums normal Neck: mild anterior cervical adenopathy, supple, symmetrical, trachea midline and thyroid not enlarged, symmetric, no tenderness/mass/nodules Lungs: wheezes bilaterally Heart: S1, S2 normal Extremities: extremities normal, atraumatic, no cyanosis or edema Lymph nodes: Cervical adenopathy: b/l    Assessment:    Acute Bronchitis    Plan:    Antibiotics per medication orders. Antitussives per medication orders. Avoid exposure to tobacco smoke and fumes. B-agonist inhaler. Call if shortness of breath worsens, blood in sputum, change in character of cough, development  of fever or chills, inability to maintain nutrition and hydration. Avoid exposure to tobacco smoke and fumes. Chest x-ray. Steroid inhaler as ordered. Trial of steroid nasal spray.

## 2011-08-23 NOTE — Patient Instructions (Signed)

## 2011-08-24 ENCOUNTER — Ambulatory Visit (INDEPENDENT_AMBULATORY_CARE_PROVIDER_SITE_OTHER)
Admission: RE | Admit: 2011-08-24 | Discharge: 2011-08-24 | Disposition: A | Discharge status: Home / Self Care | Payer: BC Managed Care – PPO | Source: Ambulatory Visit | Attending: Family Medicine | Admitting: Family Medicine

## 2011-08-24 DIAGNOSIS — J40 Bronchitis, not specified as acute or chronic: Secondary | ICD-10-CM

## 2011-08-27 ENCOUNTER — Other Ambulatory Visit: Payer: Self-pay | Admitting: *Deleted

## 2011-08-27 MED ORDER — POTASSIUM CHLORIDE CRYS ER 20 MEQ PO TBCR: 40.0000 meq | EXTENDED_RELEASE_TABLET | Freq: Every day | ORAL | Status: DC

## 2011-09-07 ENCOUNTER — Other Ambulatory Visit: Payer: Self-pay | Admitting: *Deleted

## 2011-09-07 MED ORDER — OLMESARTAN-AMLODIPINE-HCTZ 20-5-12.5 MG PO TABS: 1.0000 | ORAL_TABLET | Freq: Every day | ORAL | Status: DC

## 2011-09-07 NOTE — Telephone Encounter (Signed)
Rx sent 

## 2011-09-18 ENCOUNTER — Other Ambulatory Visit: Payer: BC Managed Care – PPO

## 2011-09-20 ENCOUNTER — Encounter: Payer: Self-pay | Admitting: Family Medicine

## 2011-10-01 ENCOUNTER — Ambulatory Visit: Payer: BC Managed Care – PPO

## 2011-10-01 ENCOUNTER — Ambulatory Visit (INDEPENDENT_AMBULATORY_CARE_PROVIDER_SITE_OTHER): Payer: BC Managed Care – PPO | Admitting: Family Medicine

## 2011-10-01 ENCOUNTER — Encounter: Payer: Self-pay | Admitting: Family Medicine

## 2011-10-01 VITALS — BP 114/74 | HR 72 | Temp 98.8°F | Ht 68.0 in | Wt 211.0 lb

## 2011-10-01 DIAGNOSIS — R87619 Unspecified abnormal cytological findings in specimens from cervix uteri: Secondary | ICD-10-CM

## 2011-10-01 DIAGNOSIS — Z0000 Encounter for general adult medical examination without abnormal findings: Secondary | ICD-10-CM

## 2011-10-01 DIAGNOSIS — I10 Essential (primary) hypertension: Secondary | ICD-10-CM

## 2011-10-01 LAB — CBC WITH DIFFERENTIAL/PLATELET
Basophils Absolute: 0 10*3/uL (ref 0.0–0.1)
Basophils Relative: 1 % (ref 0.0–3.0)
Eosinophils Absolute: 0.1 10*3/uL (ref 0.0–0.7)
Eosinophils Relative: 2 % (ref 0.0–5.0)
HCT: 39.8 % (ref 36.0–46.0)
Hemoglobin: 13.5 g/dL (ref 12.0–15.0)
Lymphocytes Relative: 57.9 % — ABNORMAL HIGH (ref 12.0–46.0)
Lymphs Abs: 2.8 10*3/uL (ref 0.7–4.0)
MCHC: 33.9 g/dL (ref 30.0–36.0)
MCV: 91 fl (ref 78.0–100.0)
Monocytes Absolute: 0.4 10*3/uL (ref 0.1–1.0)
Monocytes Relative: 8.4 % (ref 3.0–12.0)
Neutro Abs: 1.5 10*3/uL (ref 1.4–7.7)
Neutrophils Relative %: 30.7 % — ABNORMAL LOW (ref 43.0–77.0)
Platelets: 189 10*3/uL (ref 150.0–400.0)
RBC: 4.37 Mil/uL (ref 3.87–5.11)
RDW: 13.1 % (ref 11.5–14.6)
WBC: 4.9 10*3/uL (ref 4.5–10.5)

## 2011-10-01 LAB — BASIC METABOLIC PANEL
BUN: 10 mg/dL (ref 6–23)
CO2: 28 mEq/L (ref 19–32)
Calcium: 9.1 mg/dL (ref 8.4–10.5)
Chloride: 103 mEq/L (ref 96–112)
Creatinine, Ser: 0.6 mg/dL (ref 0.4–1.2)
GFR: 132.46 mL/min (ref >60.00)
Glucose, Bld: 108 mg/dL — ABNORMAL HIGH (ref 70–99)
Potassium: 3.2 mEq/L — ABNORMAL LOW (ref 3.5–5.1)
Sodium: 140 mEq/L (ref 135–145)

## 2011-10-01 LAB — HEPATIC FUNCTION PANEL
ALT: 19 U/L (ref 0–35)
AST: 21 U/L (ref 0–37)
Albumin: 4 g/dL (ref 3.5–5.2)
Alkaline Phosphatase: 62 U/L (ref 39–117)
Bilirubin, Direct: 0 mg/dL (ref 0.0–0.3)
Total Bilirubin: 0.5 mg/dL (ref 0.3–1.2)
Total Protein: 6.8 g/dL (ref 6.0–8.3)

## 2011-10-01 LAB — LIPID PANEL
Cholesterol: 192 mg/dL (ref 0–200)
HDL: 59.5 mg/dL (ref >39.00)
LDL Cholesterol: 110 mg/dL — ABNORMAL HIGH (ref 0–99)
Total CHOL/HDL Ratio: 3
Triglycerides: 114 mg/dL (ref 0.0–149.0)
VLDL: 22.8 mg/dL (ref 0.0–40.0)

## 2011-10-01 LAB — TSH: TSH: 0.82 u[IU]/mL (ref 0.35–5.50)

## 2011-10-01 MED ORDER — AMLODIPINE-OLMESARTAN 5-20 MG PO TABS: 1.0000 | ORAL_TABLET | Freq: Every day | ORAL | Status: DC

## 2011-10-01 NOTE — Patient Instructions (Signed)
Preventive Care for Adults, Female A healthy lifestyle and preventive care can promote health and wellness. Preventive health guidelines for women include the following key practices.  A routine yearly physical is a good way to check with your caregiver about your health and preventive screening. It is a chance to share any concerns and updates on your health, and to receive a thorough exam.   Visit your dentist for a routine exam and preventive care every 6 months. Brush your teeth twice a day and floss once a day. Good oral hygiene prevents tooth decay and gum disease.   The frequency of eye exams is based on your age, health, family medical history, use of contact lenses, and other factors. Follow your caregiver's recommendations for frequency of eye exams.   Eat a healthy diet. Foods like vegetables, fruits, whole grains, low-fat dairy products, and lean protein foods contain the nutrients you need without too many calories. Decrease your intake of foods high in solid fats, added sugars, and salt. Eat the right amount of calories for you.Get information about a proper diet from your caregiver, if necessary.   Regular physical exercise is one of the most important things you can do for your health. Most adults should get at least 150 minutes of moderate-intensity exercise (any activity that increases your heart rate and causes you to sweat) each week. In addition, most adults need muscle-strengthening exercises on 2 or more days a week.   Maintain a healthy weight. The body mass index (BMI) is a screening tool to identify possible weight problems. It provides an estimate of body fat based on height and weight. Your caregiver can help determine your BMI, and can help you achieve or maintain a healthy weight.For adults 20 years and older:   A BMI below 18.5 is considered underweight.   A BMI of 18.5 to 24.9 is normal.   A BMI of 25 to 29.9 is considered overweight.   A BMI of 30 and above is  considered obese.   Maintain normal blood lipids and cholesterol levels by exercising and minimizing your intake of saturated fat. Eat a balanced diet with plenty of fruit and vegetables. Blood tests for lipids and cholesterol should begin at age 20 and be repeated every 5 years. If your lipid or cholesterol levels are high, you are over 50, or you are at high risk for heart disease, you may need your cholesterol levels checked more frequently.Ongoing high lipid and cholesterol levels should be treated with medicines if diet and exercise are not effective.   If you smoke, find out from your caregiver how to quit. If you do not use tobacco, do not start.   If you are pregnant, do not drink alcohol. If you are breastfeeding, be very cautious about drinking alcohol. If you are not pregnant and choose to drink alcohol, do not exceed 1 drink per day. One drink is considered to be 12 ounces (355 mL) of beer, 5 ounces (148 mL) of wine, or 1.5 ounces (44 mL) of liquor.   Avoid use of street drugs. Do not share needles with anyone. Ask for help if you need support or instructions about stopping the use of drugs.   High blood pressure causes heart disease and increases the risk of stroke. Your blood pressure should be checked at least every 1 to 2 years. Ongoing high blood pressure should be treated with medicines if weight loss and exercise are not effective.   If you are 55 to 52   years old, ask your caregiver if you should take aspirin to prevent strokes.   Diabetes screening involves taking a blood sample to check your fasting blood sugar level. This should be done once every 3 years, after age 45, if you are within normal weight and without risk factors for diabetes. Testing should be considered at a younger age or be carried out more frequently if you are overweight and have at least 1 risk factor for diabetes.   Breast cancer screening is essential preventive care for women. You should practice "breast  self-awareness." This means understanding the normal appearance and feel of your breasts and may include breast self-examination. Any changes detected, no matter how small, should be reported to a caregiver. Women in their 20s and 30s should have a clinical breast exam (CBE) by a caregiver as part of a regular health exam every 1 to 3 years. After age 40, women should have a CBE every year. Starting at age 40, women should consider having a mammography (breast X-ray test) every year. Women who have a family history of breast cancer should talk to their caregiver about genetic screening. Women at a high risk of breast cancer should talk to their caregivers about having magnetic resonance imaging (MRI) and a mammography every year.   The Pap test is a screening test for cervical cancer. A Pap test can show cell changes on the cervix that might become cervical cancer if left untreated. A Pap test is a procedure in which cells are obtained and examined from the lower end of the uterus (cervix).   Women should have a Pap test starting at age 21.   Between ages 21 and 29, Pap tests should be repeated every 2 years.   Beginning at age 30, you should have a Pap test every 3 years as long as the past 3 Pap tests have been normal.   Some women have medical problems that increase the chance of getting cervical cancer. Talk to your caregiver about these problems. It is especially important to talk to your caregiver if a new problem develops soon after your last Pap test. In these cases, your caregiver may recommend more frequent screening and Pap tests.   The above recommendations are the same for women who have or have not gotten the vaccine for human papillomavirus (HPV).   If you had a hysterectomy for a problem that was not cancer or a condition that could lead to cancer, then you no longer need Pap tests. Even if you no longer need a Pap test, a regular exam is a good idea to make sure no other problems are  starting.   If you are between ages 65 and 70, and you have had normal Pap tests going back 10 years, you no longer need Pap tests. Even if you no longer need a Pap test, a regular exam is a good idea to make sure no other problems are starting.   If you have had past treatment for cervical cancer or a condition that could lead to cancer, you need Pap tests and screening for cancer for at least 20 years after your treatment.   If Pap tests have been discontinued, risk factors (such as a new sexual partner) need to be reassessed to determine if screening should be resumed.   The HPV test is an additional test that may be used for cervical cancer screening. The HPV test looks for the virus that can cause the cell changes on the cervix.   The cells collected during the Pap test can be tested for HPV. The HPV test could be used to screen women aged 30 years and older, and should be used in women of any age who have unclear Pap test results. After the age of 30, women should have HPV testing at the same frequency as a Pap test.   Colorectal cancer can be detected and often prevented. Most routine colorectal cancer screening begins at the age of 50 and continues through age 75. However, your caregiver may recommend screening at an earlier age if you have risk factors for colon cancer. On a yearly basis, your caregiver may provide home test kits to check for hidden blood in the stool. Use of a small camera at the end of a tube, to directly examine the colon (sigmoidoscopy or colonoscopy), can detect the earliest forms of colorectal cancer. Talk to your caregiver about this at age 50, when routine screening begins. Direct examination of the colon should be repeated every 5 to 10 years through age 75, unless early forms of pre-cancerous polyps or small growths are found.   Hepatitis C blood testing is recommended for all people born from 1945 through 1965 and any individual with known risks for hepatitis C.    Practice safe sex. Use condoms and avoid high-risk sexual practices to reduce the spread of sexually transmitted infections (STIs). STIs include gonorrhea, chlamydia, syphilis, trichomonas, herpes, HPV, and human immunodeficiency virus (HIV). Herpes, HIV, and HPV are viral illnesses that have no cure. They can result in disability, cancer, and death. Sexually active women aged 25 and younger should be checked for chlamydia. Older women with new or multiple partners should also be tested for chlamydia. Testing for other STIs is recommended if you are sexually active and at increased risk.   Osteoporosis is a disease in which the bones lose minerals and strength with aging. This can result in serious bone fractures. The risk of osteoporosis can be identified using a bone density scan. Women ages 65 and over and women at risk for fractures or osteoporosis should discuss screening with their caregivers. Ask your caregiver whether you should take a calcium supplement or vitamin D to reduce the rate of osteoporosis.   Menopause can be associated with physical symptoms and risks. Hormone replacement therapy is available to decrease symptoms and risks. You should talk to your caregiver about whether hormone replacement therapy is right for you.   Use sunscreen with sun protection factor (SPF) of 30 or more. Apply sunscreen liberally and repeatedly throughout the day. You should seek shade when your shadow is shorter than you. Protect yourself by wearing long sleeves, pants, a wide-brimmed hat, and sunglasses year round, whenever you are outdoors.   Once a month, do a whole body skin exam, using a mirror to look at the skin on your back. Notify your caregiver of new moles, moles that have irregular borders, moles that are larger than a pencil eraser, or moles that have changed in shape or color.   Stay current with required immunizations.   Influenza. You need a dose every fall (or winter). The composition of  the flu vaccine changes each year, so being vaccinated once is not enough.   Pneumococcal polysaccharide. You need 1 to 2 doses if you smoke cigarettes or if you have certain chronic medical conditions. You need 1 dose at age 65 (or older) if you have never been vaccinated.   Tetanus, diphtheria, pertussis (Tdap, Td). Get 1 dose of   Tdap vaccine if you are younger than age 65, are over 65 and have contact with an infant, are a healthcare worker, are pregnant, or simply want to be protected from whooping cough. After that, you need a Td booster dose every 10 years. Consult your caregiver if you have not had at least 3 tetanus and diphtheria-containing shots sometime in your life or have a deep or dirty wound.   HPV. You need this vaccine if you are a woman age 26 or younger. The vaccine is given in 3 doses over 6 months.   Measles, mumps, rubella (MMR). You need at least 1 dose of MMR if you were born in 1957 or later. You may also need a second dose.   Meningococcal. If you are age 19 to 21 and a first-year college student living in a residence hall, or have one of several medical conditions, you need to get vaccinated against meningococcal disease. You may also need additional booster doses.   Zoster (shingles). If you are age 60 or older, you should get this vaccine.   Varicella (chickenpox). If you have never had chickenpox or you were vaccinated but received only 1 dose, talk to your caregiver to find out if you need this vaccine.   Hepatitis A. You need this vaccine if you have a specific risk factor for hepatitis A virus infection or you simply wish to be protected from this disease. The vaccine is usually given as 2 doses, 6 to 18 months apart.   Hepatitis B. You need this vaccine if you have a specific risk factor for hepatitis B virus infection or you simply wish to be protected from this disease. The vaccine is given in 3 doses, usually over 6 months.  Preventive Services /  Frequency Ages 19 to 39  Blood pressure check.** / Every 1 to 2 years.   Lipid and cholesterol check.** / Every 5 years beginning at age 20.   Clinical breast exam.** / Every 3 years for women in their 20s and 30s.   Pap test.** / Every 2 years from ages 21 through 29. Every 3 years starting at age 30 through age 65 or 70 with a history of 3 consecutive normal Pap tests.   HPV screening.** / Every 3 years from ages 30 through ages 65 to 70 with a history of 3 consecutive normal Pap tests.   Hepatitis C blood test.** / For any individual with known risks for hepatitis C.   Skin self-exam. / Monthly.   Influenza immunization.** / Every year.   Pneumococcal polysaccharide immunization.** / 1 to 2 doses if you smoke cigarettes or if you have certain chronic medical conditions.   Tetanus, diphtheria, pertussis (Tdap, Td) immunization. / A one-time dose of Tdap vaccine. After that, you need a Td booster dose every 10 years.   HPV immunization. / 3 doses over 6 months, if you are 26 and younger.   Measles, mumps, rubella (MMR) immunization. / You need at least 1 dose of MMR if you were born in 1957 or later. You may also need a second dose.   Meningococcal immunization. / 1 dose if you are age 19 to 21 and a first-year college student living in a residence hall, or have one of several medical conditions, you need to get vaccinated against meningococcal disease. You may also need additional booster doses.   Varicella immunization.** / Consult your caregiver.   Hepatitis A immunization.** / Consult your caregiver. 2 doses, 6 to 18 months   apart.   Hepatitis B immunization.** / Consult your caregiver. 3 doses usually over 6 months.  Ages 40 to 64  Blood pressure check.** / Every 1 to 2 years.   Lipid and cholesterol check.** / Every 5 years beginning at age 20.   Clinical breast exam.** / Every year after age 40.   Mammogram.** / Every year beginning at age 40 and continuing for as  long as you are in good health. Consult with your caregiver.   Pap test.** / Every 3 years starting at age 30 through age 65 or 70 with a history of 3 consecutive normal Pap tests.   HPV screening.** / Every 3 years from ages 30 through ages 65 to 70 with a history of 3 consecutive normal Pap tests.   Fecal occult blood test (FOBT) of stool. / Every year beginning at age 50 and continuing until age 75. You may not need to do this test if you get a colonoscopy every 10 years.   Flexible sigmoidoscopy or colonoscopy.** / Every 5 years for a flexible sigmoidoscopy or every 10 years for a colonoscopy beginning at age 50 and continuing until age 75.   Hepatitis C blood test.** / For all people born from 1945 through 1965 and any individual with known risks for hepatitis C.   Skin self-exam. / Monthly.   Influenza immunization.** / Every year.   Pneumococcal polysaccharide immunization.** / 1 to 2 doses if you smoke cigarettes or if you have certain chronic medical conditions.   Tetanus, diphtheria, pertussis (Tdap, Td) immunization.** / A one-time dose of Tdap vaccine. After that, you need a Td booster dose every 10 years.   Measles, mumps, rubella (MMR) immunization. / You need at least 1 dose of MMR if you were born in 1957 or later. You may also need a second dose.   Varicella immunization.** / Consult your caregiver.   Meningococcal immunization.** / Consult your caregiver.   Hepatitis A immunization.** / Consult your caregiver. 2 doses, 6 to 18 months apart.   Hepatitis B immunization.** / Consult your caregiver. 3 doses, usually over 6 months.  Ages 65 and over  Blood pressure check.** / Every 1 to 2 years.   Lipid and cholesterol check.** / Every 5 years beginning at age 20.   Clinical breast exam.** / Every year after age 40.   Mammogram.** / Every year beginning at age 40 and continuing for as long as you are in good health. Consult with your caregiver.   Pap test.** /  Every 3 years starting at age 30 through age 65 or 70 with a 3 consecutive normal Pap tests. Testing can be stopped between 65 and 70 with 3 consecutive normal Pap tests and no abnormal Pap or HPV tests in the past 10 years.   HPV screening.** / Every 3 years from ages 30 through ages 65 or 70 with a history of 3 consecutive normal Pap tests. Testing can be stopped between 65 and 70 with 3 consecutive normal Pap tests and no abnormal Pap or HPV tests in the past 10 years.   Fecal occult blood test (FOBT) of stool. / Every year beginning at age 50 and continuing until age 75. You may not need to do this test if you get a colonoscopy every 10 years.   Flexible sigmoidoscopy or colonoscopy.** / Every 5 years for a flexible sigmoidoscopy or every 10 years for a colonoscopy beginning at age 50 and continuing until age 75.   Hepatitis   C blood test.** / For all people born from 1945 through 1965 and any individual with known risks for hepatitis C.   Osteoporosis screening.** / A one-time screening for women ages 65 and over and women at risk for fractures or osteoporosis.   Skin self-exam. / Monthly.   Influenza immunization.** / Every year.   Pneumococcal polysaccharide immunization.** / 1 dose at age 65 (or older) if you have never been vaccinated.   Tetanus, diphtheria, pertussis (Tdap, Td) immunization. / A one-time dose of Tdap vaccine if you are over 65 and have contact with an infant, are a healthcare worker, or simply want to be protected from whooping cough. After that, you need a Td booster dose every 10 years.   Varicella immunization.** / Consult your caregiver.   Meningococcal immunization.** / Consult your caregiver.   Hepatitis A immunization.** / Consult your caregiver. 2 doses, 6 to 18 months apart.   Hepatitis B immunization.** / Check with your caregiver. 3 doses, usually over 6 months.  ** Family history and personal history of risk and conditions may change your caregiver's  recommendations. Document Released: 06/05/2001 Document Revised: 03/29/2011 Document Reviewed: 09/04/2010 [employer] Patient Information 2012 [employer]. 

## 2011-10-01 NOTE — Progress Notes (Signed)
  Subjective:     Angela Garner is a 51 y.o. female and is here for a comprehensive physical exam. The patient reports no problems.  History   Social History  . Marital Status: Married    Spouse Name: N/A    Number of Children: N/A  . Years of Education: N/A   Occupational History  . Not on file.   Social History Main Topics  . Smoking status: Never Smoker   . Smokeless tobacco: Never Used  . Alcohol Use: No  . Drug Use: No  . Sexually Active: Not on file   Other Topics Concern  . Not on file   Social History Narrative  . No narrative on file   Health Maintenance  Topic Date Due  . Tetanus/tdap  09/29/1978  . Colonoscopy  09/28/2009  . Influenza Vaccine  01/22/2012  . Mammogram  09/06/2013  . Pap Smear  08/01/2014    The following portions of the patient's history were reviewed and updated as appropriate: allergies, current medications, past family history, past medical history, past social history, past surgical history and problem list.  Review of Systems Review of Systems  Constitutional: Negative for activity change, appetite change and fatigue.  HENT: Negative for hearing loss, congestion, tinnitus and ear discharge.  dentist q27m Eyes: Negative for visual disturbance (see optho q1y -- vision corrected to 20/20 with glasses).  Respiratory: Negative for cough, chest tightness and shortness of breath.   Cardiovascular: Negative for chest pain, palpitations and leg swelling.  Gastrointestinal: Negative for abdominal pain, diarrhea, constipation and abdominal distention.  Genitourinary: Negative for urgency, frequency, decreased urine volume and difficulty urinating.  Musculoskeletal: Negative for back pain, arthralgias and gait problem.  Skin: Negative for color change, pallor and rash.  Neurological: Negative for dizziness, light-headedness, numbness and headaches.  Hematological: Negative for adenopathy. Does not bruise/bleed easily.  Psychiatric/Behavioral: Negative for  suicidal ideas, confusion, sleep disturbance, self-injury, dysphoric mood, decreased concentration and agitation.       Objective:    BP 114/74  Pulse 72  Temp(Src) 98.8 F (37.1 C) (Oral)  Ht 5\' 8"  (1.727 m)  Wt 211 lb (95.709 kg)  BMI 32.08 kg/m2  SpO2 99% General appearance: alert, cooperative, appears stated age and no distress Head: Normocephalic, without obvious abnormality, atraumatic Eyes: conjunctivae/corneas clear. PERRL, EOM's intact. Fundi benign. Ears: normal TM's and external ear canals both ears Nose: Nares normal. Septum midline. Mucosa normal. No drainage or sinus tenderness. Throat: lips, mucosa, and tongue normal; teeth and gums normal Neck: no adenopathy, no carotid bruit, no JVD, supple, symmetrical, trachea midline and thyroid not enlarged, symmetric, no tenderness/mass/nodules Back: symmetric, no curvature. ROM normal. No CVA tenderness. Lungs: clear to auscultation bilaterally Breasts: gyn Heart: regular rate and rhythm, S1, S2 normal, no murmur, click, rub or gallop Abdomen: soft, non-tender; bowel sounds normal; no masses,  no organomegaly Pelvic: gyn Extremities: extremities normal, atraumatic, no cyanosis or edema Pulses: 2+ and symmetric Skin: Skin color, texture, turgor normal. No rashes or lesions Lymph nodes: Cervical, supraclavicular, and axillary nodes normal. Neurologic: Alert and oriented X 3, normal strength and tone. Normal symmetric reflexes. Normal coordination and gait psych-- no depression, no anxiety    Assessment:    Healthy female exam.      Plan:    ghm utd Check fasting labs See After Visit Summary for Counseling Recommendations

## 2011-10-01 NOTE — Assessment & Plan Note (Signed)
Stable--- change to azor 5/20--- pt complaining about K pills and is asking to stop hct 12.5

## 2011-10-02 LAB — PATHOLOGIST SMEAR REVIEW

## 2011-10-08 ENCOUNTER — Telehealth: Payer: Self-pay | Admitting: *Deleted

## 2011-10-08 NOTE — Telephone Encounter (Signed)
Patient did not show for previsit 10-08-11 at 10:30am. Phone call to patient to call before 5pm today to reschedule previsit or colonoscopy scheduled 10-17-11 would be cancelled as well./TE

## 2011-10-12 DIAGNOSIS — D72820 Lymphocytosis (symptomatic): Secondary | ICD-10-CM

## 2011-10-15 ENCOUNTER — Telehealth: Payer: Self-pay | Admitting: *Deleted

## 2011-10-15 NOTE — Telephone Encounter (Signed)
[id number] incoming call from Raiford Noble with [facility] office advising that the pt gave the impression she really does not want to come to an apt with their office per noted refusal of 2 different apts and after offering a third option pt replied that she is changing jobs and wants to wait until her new insurance kicks in before coming in for apt, noted last day of current job on July 3rd, was then offered apt on July 3rd and declined this apt as well due to time conflict.

## 2011-10-15 NOTE — Telephone Encounter (Signed)
noted 

## 2011-10-17 ENCOUNTER — Encounter: Payer: BC Managed Care – PPO | Admitting: Gastroenterology

## 2011-10-23 ENCOUNTER — Telehealth: Payer: Self-pay | Admitting: Oncology

## 2011-10-23 ENCOUNTER — Telehealth: Payer: Self-pay | Admitting: Hematology & Oncology

## 2011-10-23 NOTE — Telephone Encounter (Signed)
S/w the pt's dtr for the pt to call me back when she gets out of the shower. Stated that i was calling from the doctor's office.

## 2011-10-23 NOTE — Telephone Encounter (Signed)
Pt called wanted appointment this week I told her we couldn't and that I had offered her several dates last week or so and she wouldn't take them. Patient wants to be seen at [facility] Tiffany is aware and Patient has number to call

## 2011-10-30 ENCOUNTER — Telehealth: Payer: Self-pay | Admitting: Oncology

## 2011-10-30 NOTE — Telephone Encounter (Signed)
S/w the pt and she is aware of her July 16th appts with dr Welton Flakes.  Per pt she still wants me to double check to see if their is anyway she could be seen as late as possible due to she has started a new job.

## 2011-11-02 ENCOUNTER — Telehealth: Payer: Self-pay | Admitting: Oncology

## 2011-11-02 NOTE — Telephone Encounter (Signed)
Referred by Dr. Loreen Freud Dx- Neutropenia

## 2011-11-05 ENCOUNTER — Other Ambulatory Visit: Payer: Self-pay | Admitting: *Deleted

## 2011-11-05 DIAGNOSIS — C9110 Chronic lymphocytic leukemia of B-cell type not having achieved remission: Secondary | ICD-10-CM

## 2011-11-06 ENCOUNTER — Encounter: Payer: Self-pay | Admitting: Oncology

## 2011-11-06 ENCOUNTER — Ambulatory Visit (HOSPITAL_BASED_OUTPATIENT_CLINIC_OR_DEPARTMENT_OTHER): Payer: BC Managed Care – PPO | Admitting: Oncology

## 2011-11-06 ENCOUNTER — Other Ambulatory Visit (HOSPITAL_COMMUNITY)
Admission: RE | Admit: 2011-11-06 | Discharge: 2011-11-06 | Disposition: A | Discharge status: Home / Self Care | Payer: BC Managed Care – PPO | Source: Ambulatory Visit | Attending: Oncology | Admitting: Oncology

## 2011-11-06 ENCOUNTER — Ambulatory Visit: Payer: BC Managed Care – PPO

## 2011-11-06 ENCOUNTER — Telehealth: Payer: Self-pay | Admitting: Oncology

## 2011-11-06 ENCOUNTER — Other Ambulatory Visit (HOSPITAL_BASED_OUTPATIENT_CLINIC_OR_DEPARTMENT_OTHER): Payer: BC Managed Care – PPO | Admitting: Lab

## 2011-11-06 VITALS — BP 170/103 | HR 89 | Temp 99.3°F | Ht 68.0 in | Wt 211.5 lb

## 2011-11-06 DIAGNOSIS — C9110 Chronic lymphocytic leukemia of B-cell type not having achieved remission: Secondary | ICD-10-CM | POA: Insufficient documentation

## 2011-11-06 DIAGNOSIS — D72820 Lymphocytosis (symptomatic): Secondary | ICD-10-CM

## 2011-11-06 DIAGNOSIS — I10 Essential (primary) hypertension: Secondary | ICD-10-CM

## 2011-11-06 HISTORY — DX: Lymphocytosis (symptomatic): D72.820

## 2011-11-06 LAB — CBC WITH DIFFERENTIAL/PLATELET
BASO%: 0.6 % (ref 0.0–2.0)
Basophils Absolute: 0 10*3/uL (ref 0.0–0.1)
EOS%: 1.2 % (ref 0.0–7.0)
Eosinophils Absolute: 0.1 10*3/uL (ref 0.0–0.5)
HCT: 42.1 % (ref 34.8–46.6)
HGB: 14.1 g/dL (ref 11.6–15.9)
LYMPH%: 47.7 % (ref 14.0–49.7)
MCH: 30.9 pg (ref 25.1–34.0)
MCHC: 33.5 g/dL (ref 31.5–36.0)
MCV: 92.2 fL (ref 79.5–101.0)
MONO#: 0.6 10*3/uL (ref 0.1–0.9)
MONO%: 9.6 % (ref 0.0–14.0)
NEUT#: 2.6 10*3/uL (ref 1.5–6.5)
NEUT%: 40.9 % (ref 38.4–76.8)
Platelets: 199 10*3/uL (ref 145–400)
RBC: 4.56 10*6/uL (ref 3.70–5.45)
RDW: 13.3 % (ref 11.2–14.5)
WBC: 6.3 10*3/uL (ref 3.9–10.3)
lymph#: 3 10*3/uL (ref 0.9–3.3)

## 2011-11-06 LAB — CHCC SMEAR

## 2011-11-06 NOTE — Patient Instructions (Addendum)
1. Your blood count today looks normal  2. I will see you back in 3 months to check another CBC

## 2011-11-06 NOTE — Progress Notes (Signed)
[facility]  Telephone:([phone]) [phone] Fax:([phone]     INITIAL HEMATOLOGY CONSULTATION    Referral MD:   Dr. Loreen Freud,  Reason for Referral: 51 year old female being seen for evaluation of absolute lymphocytosis recently found on a CBC.  HPI: Patient is a very pleasant 51 year old female with medical history significant for hypertension. She was recently seen by Dr. Veverly Fells for her annual physical examination. As part of her exam she had a complete blood count done. The results reveals a normal white count of 4.9 hemoglobin hematocrit were also normal at 13.5 and 39.8 platelets were normal at 189,000. However patient was noted to have decreased neutrophils at 30.7. Lymphocytes were increased to 57.9%. Because of this finding the patient was referred to hematology for further evaluation and management. Clinically patient tells me that she did have an acute sinusitis at that time she will received multiple courses of antibiotics. She finally has had clearing of her infection. Today she is without any fevers chills night sweats headaches shortness of breath chest pains palpitations she has no myalgias or arthralgias. No peripheral paresthesias she has not had recurrent skin infections sinus infections or pneumonia as. She has no easy bruising or bleeding. Remainder of the 14 point review of systems is negative.    Past Medical History  Diagnosis Date  . Hypertension   . Allergy   :    Past Surgical History  Procedure Date  . Uterine fibroid surgery   :   CURRENT MEDS: Current Outpatient Prescriptions  Medication Sig Dispense Refill  . amLODipine-olmesartan (AZOR) 5-20 MG per tablet Take 1 tablet by mouth daily.  30 tablet  5  . Azelastine-Fluticasone (DYMISTA) 137-50 MCG/ACT SUSP Place 1 spray into the nose 2 (two) times daily.      . ferrous sulfate 325 (65 FE) MG tablet Take 325 mg by mouth daily with breakfast.      . Fluticasone-Salmeterol (ADVAIR  DISKUS) 250-50 MCG/DOSE AEPB Inhale 1 puff into the lungs 2 (two) times daily.  1 each  3  . NON FORMULARY 1 each. Durol-Iron Liver Vitamins      . guaiFENesin (MUCINEX) 600 MG 12 hr tablet Take 1,200 mg by mouth 2 (two) times daily.      Marland Kitchen guaiFENesin-codeine (ROBITUSSIN AC) 100-10 MG/5ML syrup 1-2 tsp po qhs prn cough  120 mL  0      Allergies  Allergen Reactions  . Penicillins   :  Family History  Problem Relation Age of Onset  . Hypertension Father   . Heart disease Father   . Diabetes Father   :  History   Social History  . Marital Status: Married    Spouse Name: N/A    Number of Children: N/A  . Years of Education: N/A   Occupational History  . Not on file.   Social History Main Topics  . Smoking status: Never Smoker   . Smokeless tobacco: Never Used  . Alcohol Use: No  . Drug Use: No  . Sexually Active: Yes   Other Topics Concern  . Not on file   Social History Narrative  . No narrative on file  :  REVIEW OF SYSTEM:  The rest of the 14-point review of sytem was negative.   Exam: Filed Vitals:   11/06/11 1414  BP: 170/103  Pulse: 89  Temp: 99.3 F (37.4 C)  TempSrc: Oral  Height: 5\' 8"  (1.727 m)  Weight: 211 lb 8 oz (95.936 kg)  General:  well-nourished in no acute distress.  Eyes:  no scleral icterus.  ENT:  There were no oropharyngeal lesions.  Neck was without thyromegaly.  Lymphatics:  Negative cervical, supraclavicular or axillary adenopathy.  Respiratory: lungs were clear bilaterally without wheezing or crackles.  Cardiovascular:  Regular rate and rhythm, S1/S2, without murmur, rub or gallop.  There was no pedal edema.  GI:  abdomen was soft, flat, nontender, nondistended, without organomegaly.  Muscoloskeletal:  no spinal tenderness of palpation of vertebral spine.  Skin exam was without echymosis, petichae.  Neuro exam was nonfocal.  Patient was able to get on and off exam table without assistance.  Gait was normal.  Patient was alerted and  oriented.  Attention was good.   Language was appropriate.  Mood was normal without depression.  Speech was not pressured.  Thought content was not tangential.    LABS:  Lab Results  Component Value Date   WBC 6.3 11/06/2011   HGB 14.1 11/06/2011   HCT 42.1 11/06/2011   PLT 199 11/06/2011   GLUCOSE 108* 10/01/2011   CHOL 192 10/01/2011   TRIG 114.0 10/01/2011   HDL 59.50 10/01/2011   LDLDIRECT 120.9 08/02/2011   LDLCALC 110* 10/01/2011   ALT 19 10/01/2011   AST 21 10/01/2011   NA 140 10/01/2011   K 3.2* 10/01/2011   CL 103 10/01/2011   CREATININE 0.6 10/01/2011   BUN 10 10/01/2011   CO2 28 10/01/2011    No results found.  Blood smear review:   I personally reviewed the patient's peripheral blood smear today.  There was isocytosis.  There was no peripheral blast.  There was no schistocytosis, spherocytosis, target cell, rouleaux formation, tear drop cell.  Some giant platelets were noted. There were noted to be some atypical lymphocytes but they are not increased in number neutrophils were normal in morphology.    ASSESSMENT AND PLAN: 51 year old female with recent finding of relative lymphocytosis and atypical lymphocytes on a CBC performed on 10/01/2011. Patient at that time did have what sounds like a sinus infection requiring multiple courses of antibiotics. Today a CBC was performed and her white count is normal at 6.3 hemoglobin 14.1 hematocrit 42.1 platelets 199,000. The differential Shows neutrophils at 40.9 and lymphocytes at 47.7 which are both normal. No blasts or any other abnormalities are found on the peripheral smear. Patient most likely had transient lymphocytosis do to her acute illness and this is now resolved. At this time no further interventions are recommended. However I will plan on seeing her back in 3 months time for another CBC to make sure that her counts remained stable and without any abnormalities. I have discussed this with the patient she is quite relieved and she is  more than happy to come back in 3 months time.  The length of time of the face-to-face encounter was 40   minutes. More than 50% of time was spent counseling and coordination of care.   Drue Second, MD Medical/Oncology [facility] [phone] (beeper) [phone] (Office)  11/06/2011, 3:11 PM

## 2011-11-06 NOTE — Telephone Encounter (Signed)
gve the pt her nov 2013 appt calendar °

## 2011-11-07 LAB — DIRECT ANTIGLOBULIN TEST (NOT AT ARMC)
DAT (Complement): NEGATIVE
DAT IgG: NEGATIVE

## 2011-11-07 LAB — COMPREHENSIVE METABOLIC PANEL
ALT: 17 U/L (ref 0–35)
AST: 22 U/L (ref 0–37)
Albumin: 4.6 g/dL (ref 3.5–5.2)
Alkaline Phosphatase: 62 U/L (ref 39–117)
BUN: 16 mg/dL (ref 6–23)
CO2: 30 mEq/L (ref 19–32)
Calcium: 9.9 mg/dL (ref 8.4–10.5)
Chloride: 101 mEq/L (ref 96–112)
Creatinine, Ser: 0.85 mg/dL (ref 0.50–1.10)
Glucose, Bld: 143 mg/dL — ABNORMAL HIGH (ref 70–99)
Potassium: 3.5 mEq/L (ref 3.5–5.3)
Sodium: 137 mEq/L (ref 135–145)
Total Bilirubin: 0.6 mg/dL (ref 0.3–1.2)
Total Protein: 7.3 g/dL (ref 6.0–8.3)

## 2011-11-07 LAB — HAPTOGLOBIN: Haptoglobin: 36 mg/dL — ABNORMAL LOW (ref 45–215)

## 2011-11-08 LAB — SPEP & IFE WITH QIG
Albumin ELP: 61.2 % (ref 55.8–66.1)
Alpha-1-Globulin: 3.2 % (ref 2.9–4.9)
Alpha-2-Globulin: 7.4 % (ref 7.1–11.8)
Beta 2: 5.9 % (ref 3.2–6.5)
Beta Globulin: 5.3 % (ref 4.7–7.2)
Gamma Globulin: 17 % (ref 11.1–18.8)
IgA: 171 mg/dL (ref 69–380)
IgG (Immunoglobin G), Serum: 1400 mg/dL (ref 690–1700)
IgM, Serum: 164 mg/dL (ref 52–322)
Total Protein, Serum Electrophoresis: 7.4 g/dL (ref 6.0–8.3)

## 2011-11-15 ENCOUNTER — Other Ambulatory Visit: Payer: Self-pay | Admitting: *Deleted

## 2011-11-15 DIAGNOSIS — I10 Essential (primary) hypertension: Secondary | ICD-10-CM

## 2011-11-15 LAB — FLOW CYTOMETRY

## 2011-11-15 MED ORDER — AMLODIPINE-OLMESARTAN 5-20 MG PO TABS: 1.0000 | ORAL_TABLET | Freq: Every day | ORAL | Status: DC

## 2011-11-21 ENCOUNTER — Telehealth: Payer: Self-pay | Admitting: *Deleted

## 2011-11-21 NOTE — Telephone Encounter (Signed)
Pt states that she had faxed forms over and would like to know if we received them and if they have been completed and faxed back. Spoke with Selena Batten who states that Dr Laury Axon took some paper home with her and today and it is her half day. Advise Pt of this info and will check with her when she return in the morning but advise Pt in the mean time if she could provide [place] with another copy just in case for some reason Dr Laury Axon does not have the paperwork we can get it to her to complete. Pt indicated that she will bring a copy by the office on tomorrow.

## 2011-11-21 NOTE — Telephone Encounter (Signed)
i do not have anything with me.  D/w kim--pt bringing in forms tomorrow

## 2012-01-17 ENCOUNTER — Encounter: Payer: Self-pay | Admitting: Family Medicine

## 2012-01-17 ENCOUNTER — Ambulatory Visit (INDEPENDENT_AMBULATORY_CARE_PROVIDER_SITE_OTHER): Payer: BC Managed Care – PPO | Admitting: Family Medicine

## 2012-01-17 VITALS — BP 142/94 | HR 67 | Temp 98.6°F | Wt 207.8 lb

## 2012-01-17 DIAGNOSIS — N390 Urinary tract infection, site not specified: Secondary | ICD-10-CM

## 2012-01-17 DIAGNOSIS — I10 Essential (primary) hypertension: Secondary | ICD-10-CM

## 2012-01-17 DIAGNOSIS — M722 Plantar fascial fibromatosis: Secondary | ICD-10-CM

## 2012-01-17 DIAGNOSIS — R350 Frequency of micturition: Secondary | ICD-10-CM

## 2012-01-17 LAB — POCT URINALYSIS DIPSTICK
Bilirubin, UA: NEGATIVE
Blood, UA: NEGATIVE
Glucose, UA: NEGATIVE
Ketones, UA: NEGATIVE
Nitrite, UA: NEGATIVE
Protein, UA: NEGATIVE
Spec Grav, UA: <=1.005
Urobilinogen, UA: 0.2
pH, UA: 7.5

## 2012-01-17 MED ORDER — CIPROFLOXACIN HCL 500 MG PO TABS: 500.0000 mg | ORAL_TABLET | Freq: Two times a day (BID) | ORAL | Status: AC

## 2012-01-17 MED ORDER — AMLODIPINE-OLMESARTAN 5-40 MG PO TABS: 1.0000 | ORAL_TABLET | Freq: Every day | ORAL | Status: DC

## 2012-01-17 MED ORDER — TRAMADOL HCL 50 MG PO TABS: 50.0000 mg | ORAL_TABLET | Freq: Three times a day (TID) | ORAL | Status: DC | PRN

## 2012-01-17 MED ORDER — PHENAZOPYRIDINE HCL 200 MG PO TABS: 200.0000 mg | ORAL_TABLET | Freq: Three times a day (TID) | ORAL | Status: DC | PRN

## 2012-01-17 NOTE — Patient Instructions (Addendum)

## 2012-01-19 LAB — URINE CULTURE: Colony Count: 25000

## 2012-01-20 ENCOUNTER — Encounter: Payer: Self-pay | Admitting: Family Medicine

## 2012-01-20 NOTE — Progress Notes (Signed)
  ppSubjective:    SAMAH LAPIANA is a 51 y.o. female who complains of dysuria, frequency.  She has had symptoms for few days. Patient also complains of feet pain.  Patient denies vaginal d/c. Marland Kitchen Patient dose not have a history of recurrent UTI. Patient does not have a history of pyelonephritis.     Review of Systems As above   Objective:     Filed Vitals:   01/17/12 1614  BP: 142/94  Pulse: 67  Temp: 98.6 F (37 C)   gen--aaox3 abd -- soft nt Ext-- pain bottom of feet with palpation  Laboratory:  Urine dipstick: + mod leuk   Micro exam: not done.    Assessment:     dysuria--  Culture pending                      cipro              rto prn  plantar fascitis-- refer to podiatry Plan:

## 2012-01-22 ENCOUNTER — Telehealth: Payer: Self-pay | Admitting: Family Medicine

## 2012-01-22 NOTE — Telephone Encounter (Signed)
Patient left msg on triage line stating she would like to be referred to [facility] because they can get her in faster. Pt call back # [phone]

## 2012-01-22 NOTE — Telephone Encounter (Signed)
Completed required referral form, and faxed referral to [facility] per patient's request.

## 2012-01-24 ENCOUNTER — Other Ambulatory Visit: Payer: Self-pay

## 2012-01-24 DIAGNOSIS — N390 Urinary tract infection, site not specified: Secondary | ICD-10-CM

## 2012-01-24 NOTE — Telephone Encounter (Signed)
norvasc 5 mg 1 po qd  #30  5 refills Losartan 100 mg #30  1 po qd ----explain to pt that azor is 2 med in one and that it will take 2 meds to do same thing----needs o v  2-3 weeks

## 2012-01-24 NOTE — Telephone Encounter (Signed)
msg left to call the office     KP 

## 2012-01-24 NOTE — Telephone Encounter (Signed)
Pt ask can you write Rx for cheaper blood pressure pill. About to loose insurance. 90 day supply. Plz advise     MW

## 2012-01-25 ENCOUNTER — Other Ambulatory Visit (INDEPENDENT_AMBULATORY_CARE_PROVIDER_SITE_OTHER): Payer: BC Managed Care – PPO

## 2012-01-25 DIAGNOSIS — D72820 Lymphocytosis (symptomatic): Secondary | ICD-10-CM

## 2012-01-25 DIAGNOSIS — E876 Hypokalemia: Secondary | ICD-10-CM

## 2012-01-25 DIAGNOSIS — N390 Urinary tract infection, site not specified: Secondary | ICD-10-CM

## 2012-01-25 LAB — POCT URINALYSIS DIPSTICK
Bilirubin, UA: NEGATIVE
Blood, UA: NEGATIVE
Glucose, UA: NEGATIVE
Ketones, UA: NEGATIVE
Nitrite, UA: NEGATIVE
Spec Grav, UA: <=1.005
Urobilinogen, UA: 0.2
pH, UA: 8.5

## 2012-01-25 LAB — CBC WITH DIFFERENTIAL/PLATELET
Basophils Absolute: 0 10*3/uL (ref 0.0–0.1)
Basophils Relative: 1 % (ref 0.0–3.0)
Eosinophils Absolute: 0.1 10*3/uL (ref 0.0–0.7)
Eosinophils Relative: 1.5 % (ref 0.0–5.0)
HCT: 44.3 % (ref 36.0–46.0)
Hemoglobin: 14.7 g/dL (ref 12.0–15.0)
Lymphocytes Relative: 54.8 % — ABNORMAL HIGH (ref 12.0–46.0)
Lymphs Abs: 2.6 10*3/uL (ref 0.7–4.0)
MCHC: 33.1 g/dL (ref 30.0–36.0)
MCV: 92.4 fl (ref 78.0–100.0)
Monocytes Absolute: 0.5 10*3/uL (ref 0.1–1.0)
Monocytes Relative: 9.8 % (ref 3.0–12.0)
Neutro Abs: 1.6 10*3/uL (ref 1.4–7.7)
Neutrophils Relative %: 32.9 % — ABNORMAL LOW (ref 43.0–77.0)
Platelets: 191 10*3/uL (ref 150.0–400.0)
RBC: 4.79 Mil/uL (ref 3.87–5.11)
RDW: 12.8 % (ref 11.5–14.6)
WBC: 4.8 10*3/uL (ref 4.5–10.5)

## 2012-01-25 LAB — POTASSIUM: Potassium: 3.4 mEq/L — ABNORMAL LOW (ref 3.5–5.1)

## 2012-01-25 MED ORDER — LOSARTAN POTASSIUM 100 MG PO TABS: 100.0000 mg | ORAL_TABLET | Freq: Every day | ORAL | Status: DC

## 2012-01-25 NOTE — Telephone Encounter (Signed)
Rx faxed and detailed message left advising the patient.       KP

## 2012-01-25 NOTE — Telephone Encounter (Signed)
Losartan 100 mg #30 1 po qd ----- start with that --- ov 2-3 weeks to check bp

## 2012-01-25 NOTE — Telephone Encounter (Signed)
Patient does not want to take the norvasc 5 mg because she was on it in the past. Her BP was 150/98 today. Please advise      KP

## 2012-01-27 LAB — URINE CULTURE
Colony Count: NO GROWTH
Organism ID, Bacteria: NO GROWTH

## 2012-02-29 ENCOUNTER — Other Ambulatory Visit: Payer: BC Managed Care – PPO | Admitting: Lab

## 2012-02-29 ENCOUNTER — Telehealth: Payer: Self-pay | Admitting: *Deleted

## 2012-02-29 ENCOUNTER — Other Ambulatory Visit: Payer: Self-pay | Admitting: *Deleted

## 2012-02-29 ENCOUNTER — Telehealth: Payer: Self-pay | Admitting: Family Medicine

## 2012-02-29 ENCOUNTER — Encounter: Payer: Self-pay | Admitting: *Deleted

## 2012-02-29 ENCOUNTER — Ambulatory Visit: Payer: BC Managed Care – PPO | Admitting: Adult Health

## 2012-02-29 DIAGNOSIS — D72820 Lymphocytosis (symptomatic): Secondary | ICD-10-CM

## 2012-02-29 NOTE — Telephone Encounter (Signed)
Pt called in - she noticed that there was a different doctor's name on her rx for Meloxicam- so she is questioning if this is really her RX - or did she get this by mistake. Please call the patient.

## 2012-02-29 NOTE — Telephone Encounter (Signed)
msg left to call the office     KP 

## 2012-02-29 NOTE — Telephone Encounter (Signed)
Left voice message to inform the patient of the new date and time on 03-31-2012 starting at 2:00pm

## 2012-03-04 NOTE — Telephone Encounter (Signed)
msg left to call the office     KP 

## 2012-03-07 NOTE — Telephone Encounter (Signed)
No response from the patient letter mailed      KP

## 2012-03-15 ENCOUNTER — Other Ambulatory Visit: Payer: Self-pay | Admitting: Family Medicine

## 2012-03-24 ENCOUNTER — Telehealth: Payer: Self-pay

## 2012-03-24 NOTE — Telephone Encounter (Signed)
Samples left at check in and msg left to make patient aware to schedule an apt when picking up samples.     KP

## 2012-03-24 NOTE — Telephone Encounter (Signed)
Spoke with patient and she stated the Losartan was too expensive and she wanted to go back to the Azor 5-40 but she did not have any insurance and would like samples until Jan. Please advise     KP

## 2012-03-24 NOTE — Telephone Encounter (Signed)
Give her 2 weeks of samples until Dr. Laury Axon comes back & can settle this. losartan is now generic and should cost much less than Azor 5-40

## 2012-03-24 NOTE — Telephone Encounter (Signed)
Pt LMOVM stating Rx to expensive, no insurance until mid January. Pt requesting cheaper med. (pt has a heavy accent not sure name of med, I think the med name is losartan.) Pt stated something about potassium med. Plz advise pt CB: [phone]

## 2012-03-31 ENCOUNTER — Ambulatory Visit: Payer: BC Managed Care – PPO | Admitting: Oncology

## 2012-03-31 ENCOUNTER — Other Ambulatory Visit: Payer: BC Managed Care – PPO | Admitting: Lab

## 2012-04-01 ENCOUNTER — Ambulatory Visit: Payer: BC Managed Care – PPO | Admitting: Family Medicine

## 2012-05-16 ENCOUNTER — Encounter: Payer: Self-pay | Admitting: Lab

## 2012-05-19 ENCOUNTER — Encounter: Payer: Self-pay | Admitting: Family Medicine

## 2012-05-19 ENCOUNTER — Ambulatory Visit (INDEPENDENT_AMBULATORY_CARE_PROVIDER_SITE_OTHER): Payer: BC Managed Care – PPO | Admitting: Family Medicine

## 2012-05-19 VITALS — BP 146/84 | HR 76 | Temp 99.0°F | Wt 210.0 lb

## 2012-05-19 DIAGNOSIS — I10 Essential (primary) hypertension: Secondary | ICD-10-CM

## 2012-05-19 DIAGNOSIS — J069 Acute upper respiratory infection, unspecified: Secondary | ICD-10-CM

## 2012-05-19 DIAGNOSIS — Z23 Encounter for immunization: Secondary | ICD-10-CM

## 2012-05-19 LAB — BASIC METABOLIC PANEL
BUN: 12 mg/dL (ref 6–23)
CO2: 29 mEq/L (ref 19–32)
Calcium: 9.4 mg/dL (ref 8.4–10.5)
Chloride: 103 mEq/L (ref 96–112)
Creatinine, Ser: 0.7 mg/dL (ref 0.4–1.2)
GFR: 114.62 mL/min (ref >60.00)
Glucose, Bld: 134 mg/dL — ABNORMAL HIGH (ref 70–99)
Potassium: 3.6 mEq/L (ref 3.5–5.1)
Sodium: 140 mEq/L (ref 135–145)

## 2012-05-19 LAB — HEPATIC FUNCTION PANEL
ALT: 20 U/L (ref 0–35)
AST: 20 U/L (ref 0–37)
Albumin: 4.2 g/dL (ref 3.5–5.2)
Alkaline Phosphatase: 75 U/L (ref 39–117)
Bilirubin, Direct: 0 mg/dL (ref 0.0–0.3)
Total Bilirubin: 0.5 mg/dL (ref 0.3–1.2)
Total Protein: 7.3 g/dL (ref 6.0–8.3)

## 2012-05-19 LAB — CBC WITH DIFFERENTIAL/PLATELET
Basophils Absolute: 0 10*3/uL (ref 0.0–0.1)
Basophils Relative: 0.9 % (ref 0.0–3.0)
Eosinophils Absolute: 0.1 10*3/uL (ref 0.0–0.7)
Eosinophils Relative: 2.4 % (ref 0.0–5.0)
HCT: 41.2 % (ref 36.0–46.0)
Hemoglobin: 14 g/dL (ref 12.0–15.0)
Lymphocytes Relative: 46.2 % — ABNORMAL HIGH (ref 12.0–46.0)
Lymphs Abs: 2.3 10*3/uL (ref 0.7–4.0)
MCHC: 33.9 g/dL (ref 30.0–36.0)
MCV: 90.2 fl (ref 78.0–100.0)
Monocytes Absolute: 0.4 10*3/uL (ref 0.1–1.0)
Monocytes Relative: 8.5 % (ref 3.0–12.0)
Neutro Abs: 2 10*3/uL (ref 1.4–7.7)
Neutrophils Relative %: 42 % — ABNORMAL LOW (ref 43.0–77.0)
Platelets: 212 10*3/uL (ref 150.0–400.0)
RBC: 4.56 Mil/uL (ref 3.87–5.11)
RDW: 13.1 % (ref 11.5–14.6)
WBC: 4.9 10*3/uL (ref 4.5–10.5)

## 2012-05-19 LAB — POCT URINALYSIS DIPSTICK
Bilirubin, UA: NEGATIVE
Blood, UA: NEGATIVE
Glucose, UA: NEGATIVE
Ketones, UA: NEGATIVE
Leukocytes, UA: NEGATIVE
Nitrite, UA: NEGATIVE
Protein, UA: NEGATIVE
Spec Grav, UA: 1.01
Urobilinogen, UA: 0.2
pH, UA: 7.5

## 2012-05-19 LAB — LIPID PANEL
Cholesterol: 176 mg/dL (ref 0–200)
HDL: 64 mg/dL (ref >39.00)
LDL Cholesterol: 99 mg/dL (ref 0–99)
Total CHOL/HDL Ratio: 3
Triglycerides: 67 mg/dL (ref 0.0–149.0)
VLDL: 13.4 mg/dL (ref 0.0–40.0)

## 2012-05-19 MED ORDER — MOMETASONE FUROATE 50 MCG/ACT NA SUSP: 2.0000 | Freq: Every day | NASAL | Status: DC

## 2012-05-19 MED ORDER — AMLODIPINE-OLMESARTAN 10-40 MG PO TABS: 1.0000 | ORAL_TABLET | Freq: Every day | ORAL | Status: DC

## 2012-05-19 NOTE — Progress Notes (Signed)
  Subjective:    Patient here for follow-up of elevated blood pressure.  She is not exercising and is adherent to a low-salt diet.  Blood pressure not checked  at home. Cardiac symptoms: none. Patient denies: chest pain, chest pressure/discomfort, claudication, dyspnea, exertional chest pressure/discomfort, fatigue, irregular heart beat, lower extremity edema, near-syncope, orthopnea, palpitations, paroxysmal nocturnal dyspnea, syncope and tachypnea. Cardiovascular risk factors: hypertension and sedentary lifestyle. Use of agents associated with hypertension: none. History of target organ damage: none.  The following portions of the patient's history were reviewed and updated as appropriate: allergies, current medications, past family history, past medical history, past social history, past surgical history and problem list.  Review of Systems Pertinent items are noted in HPI.     Objective:    BP 146/84  Pulse 76  Temp 99 F (37.2 C) (Oral)  Wt 210 lb (95.255 kg)  SpO2 98% General appearance: alert, cooperative, appears stated age and no distress Lungs: clear to auscultation bilaterally Heart: S1, S2 normal Extremities: extremities normal, atraumatic, no cyanosis or edema    Assessment:    Hypertension, stage 1 . Evidence of target organ damage: none.   uri--  Gave her sample nasonex and cepacol---symptoms are almost completely resolved Plan:    Medication: increase to azor 10/40. Dietary sodium restriction. Regular aerobic exercise. Check blood pressures 2-3 times weekly and record. Follow up: 3 months and as needed.

## 2012-05-19 NOTE — Addendum Note (Signed)
Addended by: Arnette Norris on: 05/19/2012 11:49 AM   Modules accepted: Orders

## 2012-05-19 NOTE — Patient Instructions (Addendum)

## 2012-05-27 ENCOUNTER — Other Ambulatory Visit: Payer: Self-pay | Admitting: *Deleted

## 2012-05-27 DIAGNOSIS — I10 Essential (primary) hypertension: Secondary | ICD-10-CM

## 2012-05-27 MED ORDER — AMLODIPINE-OLMESARTAN 10-40 MG PO TABS: 1.0000 | ORAL_TABLET | Freq: Every day | ORAL | Status: DC

## 2012-05-27 NOTE — Telephone Encounter (Signed)
Pt left VM that she lost Rx and would like [place] to send Rx directly to pharmacy. Rx sent left Pt detail message.

## 2012-07-17 ENCOUNTER — Ambulatory Visit (INDEPENDENT_AMBULATORY_CARE_PROVIDER_SITE_OTHER): Payer: BC Managed Care – PPO | Admitting: Family Medicine

## 2012-07-17 VITALS — BP 120/76 | HR 77 | Temp 98.6°F | Wt 214.0 lb

## 2012-07-17 DIAGNOSIS — R350 Frequency of micturition: Secondary | ICD-10-CM

## 2012-07-17 DIAGNOSIS — R739 Hyperglycemia, unspecified: Secondary | ICD-10-CM | POA: Insufficient documentation

## 2012-07-17 DIAGNOSIS — E119 Type 2 diabetes mellitus without complications: Secondary | ICD-10-CM

## 2012-07-17 DIAGNOSIS — R7309 Other abnormal glucose: Secondary | ICD-10-CM

## 2012-07-17 HISTORY — DX: Hyperglycemia, unspecified: R73.9

## 2012-07-17 HISTORY — DX: Frequency of micturition: R35.0

## 2012-07-17 LAB — POCT URINALYSIS DIPSTICK
Bilirubin, UA: NEGATIVE
Blood, UA: NEGATIVE
Glucose, UA: NEGATIVE
Ketones, UA: NEGATIVE
Leukocytes, UA: NEGATIVE
Nitrite, UA: NEGATIVE
Protein, UA: NEGATIVE
Spec Grav, UA: <=1.005
Urobilinogen, UA: 0.2
pH, UA: 6.5

## 2012-07-17 LAB — GLUCOSE, POCT (MANUAL RESULT ENTRY): POC Glucose: 128 mg/dl — AB (ref 70–99)

## 2012-07-17 NOTE — Assessment & Plan Note (Signed)
Probably secondary to hyperglycemia ua neg

## 2012-07-17 NOTE — Assessment & Plan Note (Addendum)
Pt has glucometer --- check bid , fasting and 2 hr postprandial Check bmp and hgba1c  Pt given Diabetes info as well

## 2012-07-17 NOTE — Patient Instructions (Signed)

## 2012-07-17 NOTE — Progress Notes (Signed)
  Subjective:    Patient ID: Angela Garner, female    DOB: August 05, 1960, 52 y.o.   MRN: [id number]  HPI Pt here c/o urinary frequency since last visit.  Pt never came back in to repeat labs.    No other symptoms   Review of Systems As above    Objective:   Physical Exam  BP 120/76  Pulse 77  Temp(Src) 98.6 F (37 C) (Oral)  Wt 214 lb (97.07 kg)  BMI 32.55 kg/m2  SpO2 97% General appearance: alert, cooperative, appears stated age and no distress  Glucose-- 128 ua --neg     Assessment & Plan:

## 2012-07-18 ENCOUNTER — Encounter: Payer: Self-pay | Admitting: Family Medicine

## 2012-07-18 LAB — BASIC METABOLIC PANEL
BUN: 20 mg/dL (ref 6–23)
CO2: 27 mEq/L (ref 19–32)
Calcium: 9.7 mg/dL (ref 8.4–10.5)
Chloride: 100 mEq/L (ref 96–112)
Creatinine, Ser: 0.9 mg/dL (ref 0.4–1.2)
GFR: 87.66 mL/min (ref >60.00)
Glucose, Bld: 125 mg/dL — ABNORMAL HIGH (ref 70–99)
Potassium: 4 mEq/L (ref 3.5–5.1)
Sodium: 135 mEq/L (ref 135–145)

## 2012-07-18 LAB — MICROALBUMIN / CREATININE URINE RATIO
Creatinine,U: 79.9 mg/dL
Microalb Creat Ratio: 0.8 mg/g (ref 0.0–30.0)
Microalb, Ur: 0.6 mg/dL (ref 0.0–1.9)

## 2012-07-18 LAB — HEMOGLOBIN A1C: Hgb A1c MFr Bld: 7.3 % — ABNORMAL HIGH (ref 4.6–6.5)

## 2012-07-22 ENCOUNTER — Telehealth: Payer: Self-pay | Admitting: *Deleted

## 2012-07-22 MED ORDER — GLUCOSE BLOOD VI STRP: ORAL_STRIP | Status: DC

## 2012-07-22 MED ORDER — ONETOUCH DELICA LANCETS FINE MISC: 1.0000 | Freq: Every day | Status: DC

## 2012-07-22 MED ORDER — METFORMIN HCL ER 500 MG PO TB24: 500.0000 mg | ORAL_TABLET | Freq: Every day | ORAL | Status: DC

## 2012-07-22 NOTE — Telephone Encounter (Signed)
Notes Recorded by Lelon Perla, DO on 07/20/2012 at 12:23 PM + diabetes II----- start metformin xr 500 mg 1 po qpm #30 , 2 refills Recheck labs 3 months

## 2012-07-22 NOTE — Telephone Encounter (Signed)
Discuss with patient Rx sent   

## 2012-08-01 ENCOUNTER — Encounter: Payer: Self-pay | Admitting: Family Medicine

## 2012-08-11 ENCOUNTER — Ambulatory Visit (INDEPENDENT_AMBULATORY_CARE_PROVIDER_SITE_OTHER): Payer: BC Managed Care – PPO | Admitting: Family Medicine

## 2012-08-11 ENCOUNTER — Encounter: Payer: Self-pay | Admitting: Family Medicine

## 2012-08-11 ENCOUNTER — Telehealth: Payer: Self-pay | Admitting: Family Medicine

## 2012-08-11 VITALS — BP 120/92 | HR 87 | Temp 99.7°F | Wt 206.2 lb

## 2012-08-11 DIAGNOSIS — J209 Acute bronchitis, unspecified: Secondary | ICD-10-CM

## 2012-08-11 DIAGNOSIS — E108 Type 1 diabetes mellitus with unspecified complications: Secondary | ICD-10-CM

## 2012-08-11 DIAGNOSIS — E1065 Type 1 diabetes mellitus with hyperglycemia: Secondary | ICD-10-CM

## 2012-08-11 DIAGNOSIS — E1165 Type 2 diabetes mellitus with hyperglycemia: Secondary | ICD-10-CM

## 2012-08-11 MED ORDER — BECLOMETHASONE DIPROPIONATE 40 MCG/ACT IN AERS: 2.0000 | INHALATION_SPRAY | Freq: Two times a day (BID) | RESPIRATORY_TRACT | Status: DC

## 2012-08-11 MED ORDER — ALBUTEROL SULFATE HFA 108 (90 BASE) MCG/ACT IN AERS: 2.0000 | INHALATION_SPRAY | Freq: Four times a day (QID) | RESPIRATORY_TRACT | Status: DC | PRN

## 2012-08-11 MED ORDER — ALBUTEROL SULFATE (2.5 MG/3ML) 0.083% IN NEBU
2.5000 mg | INHALATION_SOLUTION | Freq: Once | RESPIRATORY_TRACT | Status: AC
  Administered 2012-08-11: 2.5 mg via RESPIRATORY_TRACT

## 2012-08-11 MED ORDER — GUAIFENESIN-CODEINE 100-10 MG/5ML PO SYRP: ORAL_SOLUTION | ORAL | Status: DC

## 2012-08-11 MED ORDER — AZITHROMYCIN 250 MG PO TABS: ORAL_TABLET | ORAL | Status: DC

## 2012-08-11 NOTE — Telephone Encounter (Signed)
noted 

## 2012-08-11 NOTE — Telephone Encounter (Signed)
In reference to [facility] Nutrition & Diabetes referral entered on 07/17/12, patient has never scheduled.  [place] with Nutrition has attempted to reach patient and left message with no response.  I have left message for patient to call me, and I mailed patient a letter.  As of today, 08/11/12, patient has not responded.

## 2012-08-11 NOTE — Patient Instructions (Signed)

## 2012-08-12 ENCOUNTER — Encounter: Payer: Self-pay | Admitting: Family Medicine

## 2012-08-12 DIAGNOSIS — E1151 Type 2 diabetes mellitus with diabetic peripheral angiopathy without gangrene: Secondary | ICD-10-CM | POA: Insufficient documentation

## 2012-08-12 NOTE — Assessment & Plan Note (Signed)
RN discussed diet with pt and gave pt educational material to help her

## 2012-08-12 NOTE — Progress Notes (Signed)
  Subjective:     Angela Garner is a 52 y.o. female here for evaluation of a cough. Onset of symptoms was 2 days ago. Symptoms have been gradually worsening since that time. The cough is barky and dry and is aggravated by pollens and reclining position. Associated symptoms include: wheezing. Patient does not have a history of asthma. Patient does not have a history of environmental allergens. Patient has not traveled recently. Patient does not have a history of smoking. Patient has not had a previous chest x-ray. Patient has not had a PPD done.  The following portions of the patient's history were reviewed and updated as appropriate: allergies, current medications, past family history, past medical history, past social history, past surgical history and problem list.  Review of Systems Pertinent items are noted in HPI.    Objective:    Oxygen saturation 98% on room air BP 120/92  Pulse 87  Temp(Src) 99.7 F (37.6 C) (Oral)  Wt 206 lb 3.2 oz (93.532 kg)  BMI 31.36 kg/m2  SpO2 98% General appearance: alert, cooperative, appears stated age and no distress Lungs: wheezes bilaterally---cleared with neb- albuterol Abdomen: soft, non-tender; bowel sounds normal; no masses,  no organomegaly Extremities: extremities normal, atraumatic, no cyanosis or edema    Assessment:    Acute Bronchitis    Plan:    Antibiotics per medication orders. Avoid exposure to tobacco smoke and fumes. B-agonist inhaler. Call if shortness of breath worsens, blood in sputum, change in character of cough, development of fever or chills, inability to maintain nutrition and hydration. Avoid exposure to tobacco smoke and fumes. Steroid inhaler as ordered. Trial of antihistamines.

## 2012-08-18 ENCOUNTER — Ambulatory Visit: Payer: BLUE CROSS/BLUE SHIELD | Admitting: Family Medicine

## 2012-08-20 ENCOUNTER — Telehealth: Payer: Self-pay | Admitting: General Practice

## 2012-08-20 NOTE — Telephone Encounter (Signed)
Keep missing pt's calls and when I call back pt does not answer. Notified pt on VM that we cannot just send in a pain med would need to evaluate and to call front desk to schedule.

## 2012-08-20 NOTE — Telephone Encounter (Signed)
Pt called triage line and stated that she has been having a painful muscle spasm like a "charley horse". Tried to call pt back LMOVM to return call to get more information and to schedule.

## 2012-10-01 ENCOUNTER — Other Ambulatory Visit: Payer: Self-pay | Admitting: General Practice

## 2012-10-01 MED ORDER — METFORMIN HCL ER 500 MG PO TB24: 500.0000 mg | ORAL_TABLET | Freq: Every day | ORAL | Status: DC

## 2012-10-01 NOTE — Telephone Encounter (Signed)
Med filled.  

## 2012-10-21 LAB — HM DIABETES EYE EXAM

## 2012-11-21 LAB — HM PAP SMEAR

## 2012-12-08 ENCOUNTER — Other Ambulatory Visit: Payer: Self-pay | Admitting: Family Medicine

## 2012-12-15 ENCOUNTER — Other Ambulatory Visit: Payer: Self-pay | Admitting: *Deleted

## 2012-12-15 MED ORDER — METFORMIN HCL ER 500 MG PO TB24: 500.0000 mg | ORAL_TABLET | Freq: Every day | ORAL | Status: DC

## 2012-12-15 NOTE — Telephone Encounter (Signed)
Rx was refilled for Glucophage xr 500 mg.  Ag cma

## 2013-01-07 ENCOUNTER — Encounter: Payer: Self-pay | Admitting: Family Medicine

## 2013-01-07 ENCOUNTER — Ambulatory Visit (INDEPENDENT_AMBULATORY_CARE_PROVIDER_SITE_OTHER): Payer: BC Managed Care – PPO | Admitting: Family Medicine

## 2013-01-07 VITALS — BP 130/78 | HR 76 | Temp 98.7°F | Ht 68.0 in | Wt 182.0 lb

## 2013-01-07 DIAGNOSIS — R591 Generalized enlarged lymph nodes: Secondary | ICD-10-CM

## 2013-01-07 DIAGNOSIS — IMO0002 Reserved for concepts with insufficient information to code with codable children: Secondary | ICD-10-CM

## 2013-01-07 DIAGNOSIS — E1165 Type 2 diabetes mellitus with hyperglycemia: Secondary | ICD-10-CM

## 2013-01-07 DIAGNOSIS — Z23 Encounter for immunization: Secondary | ICD-10-CM

## 2013-01-07 DIAGNOSIS — R599 Enlarged lymph nodes, unspecified: Secondary | ICD-10-CM

## 2013-01-07 DIAGNOSIS — Z0000 Encounter for general adult medical examination without abnormal findings: Secondary | ICD-10-CM

## 2013-01-07 DIAGNOSIS — I10 Essential (primary) hypertension: Secondary | ICD-10-CM

## 2013-01-07 LAB — BASIC METABOLIC PANEL
BUN: 20 mg/dL (ref 6–23)
CO2: 31 mEq/L (ref 19–32)
Calcium: 9.5 mg/dL (ref 8.4–10.5)
Chloride: 105 mEq/L (ref 96–112)
Creatinine, Ser: 0.8 mg/dL (ref 0.4–1.2)
GFR: 97.8 mL/min (ref >60.00)
Glucose, Bld: 104 mg/dL — ABNORMAL HIGH (ref 70–99)
Potassium: 3.5 mEq/L (ref 3.5–5.1)
Sodium: 140 mEq/L (ref 135–145)

## 2013-01-07 LAB — POCT URINALYSIS DIPSTICK
Bilirubin, UA: NEGATIVE
Blood, UA: NEGATIVE
Glucose, UA: NEGATIVE
Ketones, UA: NEGATIVE
Leukocytes, UA: NEGATIVE
Nitrite, UA: NEGATIVE
Protein, UA: NEGATIVE
Spec Grav, UA: 1.02
Urobilinogen, UA: 0.2
pH, UA: 5

## 2013-01-07 LAB — LIPID PANEL
Cholesterol: 191 mg/dL (ref 0–200)
HDL: 73 mg/dL (ref >39.00)
LDL Cholesterol: 109 mg/dL — ABNORMAL HIGH (ref 0–99)
Total CHOL/HDL Ratio: 3
Triglycerides: 45 mg/dL (ref 0.0–149.0)
VLDL: 9 mg/dL (ref 0.0–40.0)

## 2013-01-07 LAB — CBC WITH DIFFERENTIAL/PLATELET
Basophils Absolute: 0 10*3/uL (ref 0.0–0.1)
Basophils Relative: 0.9 % (ref 0.0–3.0)
Eosinophils Absolute: 0.1 10*3/uL (ref 0.0–0.7)
Eosinophils Relative: 1.2 % (ref 0.0–5.0)
HCT: 41.9 % (ref 36.0–46.0)
Hemoglobin: 14.2 g/dL (ref 12.0–15.0)
Lymphocytes Relative: 49.4 % — ABNORMAL HIGH (ref 12.0–46.0)
Lymphs Abs: 2.6 10*3/uL (ref 0.7–4.0)
MCHC: 33.9 g/dL (ref 30.0–36.0)
MCV: 91.1 fl (ref 78.0–100.0)
Monocytes Absolute: 0.5 10*3/uL (ref 0.1–1.0)
Monocytes Relative: 8.8 % (ref 3.0–12.0)
Neutro Abs: 2.1 10*3/uL (ref 1.4–7.7)
Neutrophils Relative %: 39.7 % — ABNORMAL LOW (ref 43.0–77.0)
Platelets: 192 10*3/uL (ref 150.0–400.0)
RBC: 4.6 Mil/uL (ref 3.87–5.11)
RDW: 13.9 % (ref 11.5–14.6)
WBC: 5.2 10*3/uL (ref 4.5–10.5)

## 2013-01-07 LAB — HEPATIC FUNCTION PANEL
ALT: 25 U/L (ref 0–35)
AST: 24 U/L (ref 0–37)
Albumin: 4.4 g/dL (ref 3.5–5.2)
Alkaline Phosphatase: 63 U/L (ref 39–117)
Bilirubin, Direct: 0 mg/dL (ref 0.0–0.3)
Total Bilirubin: 0.7 mg/dL (ref 0.3–1.2)
Total Protein: 7.4 g/dL (ref 6.0–8.3)

## 2013-01-07 LAB — MICROALBUMIN / CREATININE URINE RATIO
Creatinine,U: 159.5 mg/dL
Microalb Creat Ratio: 1.3 mg/g (ref 0.0–30.0)
Microalb, Ur: 2.1 mg/dL — ABNORMAL HIGH (ref 0.0–1.9)

## 2013-01-07 LAB — HEMOGLOBIN A1C: Hgb A1c MFr Bld: 6.2 % (ref 4.6–6.5)

## 2013-01-07 LAB — TSH: TSH: 1.41 u[IU]/mL (ref 0.35–5.50)

## 2013-01-07 MED ORDER — METFORMIN HCL ER 500 MG PO TB24: 500.0000 mg | ORAL_TABLET | Freq: Every day | ORAL | Status: DC

## 2013-01-07 MED ORDER — AMLODIPINE-OLMESARTAN 10-40 MG PO TABS: ORAL_TABLET | ORAL | Status: DC

## 2013-01-07 NOTE — Progress Notes (Signed)
Subjective:     Angela Garner is a 52 y.o. female and is here for a comprehensive physical exam. The patient reports no problems.  History   Social History  . Marital Status: Married    Spouse Name: N/A    Number of Children: N/A  . Years of Education: N/A   Occupational History  . Not on file.   Social History Main Topics  . Smoking status: Never Smoker   . Smokeless tobacco: Never Used  . Alcohol Use: No  . Drug Use: No  . Sexual Activity: Yes   Other Topics Concern  . Not on file   Social History Narrative  . No narrative on file   Health Maintenance  Topic Date Due  . Tetanus/tdap  09/29/1978  . Colonoscopy  09/28/2009  . Influenza Vaccine  11/21/2012  . Mammogram  11/22/2014  . Pap Smear  11/22/2015    The following portions of the patient's history were reviewed and updated as appropriate:  She  has a past medical history of Hypertension and Allergy. She  does not have any pertinent problems on file. She  has past surgical history that includes Uterine fibroid surgery. Her family history includes Diabetes in her father; Heart disease in her father; Hypertension in her father. She  reports that she has never smoked. She has never used smokeless tobacco. She reports that she does not drink alcohol or use illicit drugs. She has a current medication list which includes the following prescription(s): albuterol, amlodipine-olmesartan, beclomethasone, darifenacin, fluticasone-salmeterol, glucose blood, metformin, multivitamin-iron-minerals-folic acid, NON FORMULARY, and onetouch delica lancets fine. Current Outpatient Prescriptions on File Prior to Visit  Medication Sig Dispense Refill  . albuterol (PROAIR HFA) 108 (90 BASE) MCG/ACT inhaler Inhale 2 puffs into the lungs every 6 (six) hours as needed for wheezing.  1 Inhaler  2  . beclomethasone (QVAR) 40 MCG/ACT inhaler Inhale 2 puffs into the lungs 2 (two) times daily.  1 Inhaler  12  . Fluticasone-Salmeterol (ADVAIR  DISKUS) 250-50 MCG/DOSE AEPB Inhale 1 puff into the lungs 2 (two) times daily.  1 each  3  . glucose blood (ONE TOUCH TEST STRIPS) test strip Test Blood sugar twice a day  100 each  2  . multivitamin-iron-minerals-folic acid (CENTRUM) chewable tablet Chew 1 tablet by mouth daily.      . NON FORMULARY 1 each. Durol-Iron Liver Vitamins      . ONETOUCH DELICA LANCETS FINE MISC 1 each by Does not apply route daily.  100 each  2   No current facility-administered medications on file prior to visit.   She is allergic to penicillins..  Review of Systems Review of Systems  Constitutional: Negative for activity change, appetite change and fatigue.  HENT: Negative for hearing loss, congestion, tinnitus and ear discharge.  dentist q64m Eyes: Negative for visual disturbance (see optho q1y -- vision corrected to 20/20 with glasses).  Respiratory: Negative for cough, chest tightness and shortness of breath.   Cardiovascular: Negative for chest pain, palpitations and leg swelling.  Gastrointestinal: Negative for abdominal pain, diarrhea, constipation and abdominal distention.  Genitourinary: Negative for urgency, frequency, decreased urine volume and difficulty urinating.  Musculoskeletal: Negative for back pain, arthralgias and gait problem.  Skin: Negative for color change, pallor and rash.  Neurological: Negative for dizziness, light-headedness, numbness and headaches.  Hematological: Negative for adenopathy. Does not bruise/bleed easily.  Psychiatric/Behavioral: Negative for suicidal ideas, confusion, sleep disturbance, self-injury, dysphoric mood, decreased concentration and agitation.  Objective:    BP 130/78  Pulse 76  Temp(Src) 98.7 F (37.1 C) (Oral)  Ht 5\' 8"  (1.727 m)  Wt 182 lb (82.555 kg)  BMI 27.68 kg/m2  SpO2 98% General appearance: alert, cooperative, appears stated age and no distress Head: Normocephalic, without obvious abnormality, atraumatic Eyes:  conjunctivae/corneas clear. PERRL, EOM's intact. Fundi benign. Ears: normal TM's and external ear canals both ears Nose: Nares normal. Septum midline. Mucosa normal. No drainage or sinus tenderness. Throat: lips, mucosa, and tongue normal; teeth and gums normal Neck: no adenopathy, no carotid bruit, no JVD, supple, symmetrical, trachea midline and thyroid not enlarged, symmetric, no tenderness/mass/nodules Back: symmetric, no curvature. ROM normal. No CVA tenderness. Lungs: clear to auscultation bilaterally Breasts: gyn Heart: regular rate and rhythm, S1, S2 normal, no murmur, click, rub or gallop Abdomen: soft, non-tender; bowel sounds normal; no masses,  no organomegaly Pelvic: deferred--gyn Extremities: extremities normal, atraumatic, no cyanosis or edema Pulses: 2+ and symmetric Skin: Skin color, texture, turgor normal. No rashes or lesions Lymph nodes: Cervical, supraclavicular, and axillary nodes normal. Neurologic: Alert and oriented X 3, normal strength and tone. Normal symmetric reflexes. Normal coordination and gait Psych-- no anxiety, no depression      Assessment:    Healthy female exam.      Plan:    ghm utd Check labs See avs See After Visit Summary for Counseling Recommendations

## 2013-01-07 NOTE — Addendum Note (Signed)
Addended by: Arnette Norris on: 01/07/2013 01:47 PM   Modules accepted: Orders

## 2013-01-07 NOTE — Patient Instructions (Addendum)
Preventive Care for Adults, Female A healthy lifestyle and preventive care can promote health and wellness. Preventive health guidelines for women include the following key practices.  A routine yearly physical is a good way to check with your caregiver about your health and preventive screening. It is a chance to share any concerns and updates on your health, and to receive a thorough exam.  Visit your dentist for a routine exam and preventive care every 6 months. Brush your teeth twice a day and floss once a day. Good oral hygiene prevents tooth decay and gum disease.  The frequency of eye exams is based on your age, health, family medical history, use of contact lenses, and other factors. Follow your caregiver's recommendations for frequency of eye exams.  Eat a healthy diet. Foods like vegetables, fruits, whole grains, low-fat dairy products, and lean protein foods contain the nutrients you need without too many calories. Decrease your intake of foods high in solid fats, added sugars, and salt. Eat the right amount of calories for you.Get information about a proper diet from your caregiver, if necessary.  Regular physical exercise is one of the most important things you can do for your health. Most adults should get at least 150 minutes of moderate-intensity exercise (any activity that increases your heart rate and causes you to sweat) each week. In addition, most adults need muscle-strengthening exercises on 2 or more days a week.  Maintain a healthy weight. The body mass index (BMI) is a screening tool to identify possible weight problems. It provides an estimate of body fat based on height and weight. Your caregiver can help determine your BMI, and can help you achieve or maintain a healthy weight.For adults 20 years and older:  A BMI below 18.5 is considered underweight.  A BMI of 18.5 to 24.9 is normal.  A BMI of 25 to 29.9 is considered overweight.  A BMI of 30 and above is  considered obese.  Maintain normal blood lipids and cholesterol levels by exercising and minimizing your intake of saturated fat. Eat a balanced diet with plenty of fruit and vegetables. Blood tests for lipids and cholesterol should begin at age 20 and be repeated every 5 years. If your lipid or cholesterol levels are high, you are over 50, or you are at high risk for heart disease, you may need your cholesterol levels checked more frequently.Ongoing high lipid and cholesterol levels should be treated with medicines if diet and exercise are not effective.  If you smoke, find out from your caregiver how to quit. If you do not use tobacco, do not start.  If you are pregnant, do not drink alcohol. If you are breastfeeding, be very cautious about drinking alcohol. If you are not pregnant and choose to drink alcohol, do not exceed 1 drink per day. One drink is considered to be 12 ounces (355 mL) of beer, 5 ounces (148 mL) of wine, or 1.5 ounces (44 mL) of liquor.  Avoid use of street drugs. Do not share needles with anyone. Ask for help if you need support or instructions about stopping the use of drugs.  High blood pressure causes heart disease and increases the risk of stroke. Your blood pressure should be checked at least every 1 to 2 years. Ongoing high blood pressure should be treated with medicines if weight loss and exercise are not effective.  If you are 55 to 53 years old, ask your caregiver if you should take aspirin to prevent strokes.  Diabetes   screening involves taking a blood sample to check your fasting blood sugar level. This should be done once every 3 years, after age 45, if you are within normal weight and without risk factors for diabetes. Testing should be considered at a younger age or be carried out more frequently if you are overweight and have at least 1 risk factor for diabetes.  Breast cancer screening is essential preventive care for women. You should practice "breast  self-awareness." This means understanding the normal appearance and feel of your breasts and may include breast self-examination. Any changes detected, no matter how small, should be reported to a caregiver. Women in their 20s and 30s should have a clinical breast exam (CBE) by a caregiver as part of a regular health exam every 1 to 3 years. After age 40, women should have a CBE every year. Starting at age 40, women should consider having a mammography (breast X-ray test) every year. Women who have a family history of breast cancer should talk to their caregiver about genetic screening. Women at a high risk of breast cancer should talk to their caregivers about having magnetic resonance imaging (MRI) and a mammography every year.  The Pap test is a screening test for cervical cancer. A Pap test can show cell changes on the cervix that might become cervical cancer if left untreated. A Pap test is a procedure in which cells are obtained and examined from the lower end of the uterus (cervix).  Women should have a Pap test starting at age 21.  Between ages 21 and 29, Pap tests should be repeated every 2 years.  Beginning at age 30, you should have a Pap test every 3 years as long as the past 3 Pap tests have been normal.  Some women have medical problems that increase the chance of getting cervical cancer. Talk to your caregiver about these problems. It is especially important to talk to your caregiver if a new problem develops soon after your last Pap test. In these cases, your caregiver may recommend more frequent screening and Pap tests.  The above recommendations are the same for women who have or have not gotten the vaccine for human papillomavirus (HPV).  If you had a hysterectomy for a problem that was not cancer or a condition that could lead to cancer, then you no longer need Pap tests. Even if you no longer need a Pap test, a regular exam is a good idea to make sure no other problems are  starting.  If you are between ages 65 and 70, and you have had normal Pap tests going back 10 years, you no longer need Pap tests. Even if you no longer need a Pap test, a regular exam is a good idea to make sure no other problems are starting.  If you have had past treatment for cervical cancer or a condition that could lead to cancer, you need Pap tests and screening for cancer for at least 20 years after your treatment.  If Pap tests have been discontinued, risk factors (such as a new sexual partner) need to be reassessed to determine if screening should be resumed.  The HPV test is an additional test that may be used for cervical cancer screening. The HPV test looks for the virus that can cause the cell changes on the cervix. The cells collected during the Pap test can be tested for HPV. The HPV test could be used to screen women aged 30 years and older, and should   be used in women of any age who have unclear Pap test results. After the age of 30, women should have HPV testing at the same frequency as a Pap test.  Colorectal cancer can be detected and often prevented. Most routine colorectal cancer screening begins at the age of 50 and continues through age 75. However, your caregiver may recommend screening at an earlier age if you have risk factors for colon cancer. On a yearly basis, your caregiver may provide home test kits to check for hidden blood in the stool. Use of a small camera at the end of a tube, to directly examine the colon (sigmoidoscopy or colonoscopy), can detect the earliest forms of colorectal cancer. Talk to your caregiver about this at age 50, when routine screening begins. Direct examination of the colon should be repeated every 5 to 10 years through age 75, unless early forms of pre-cancerous polyps or small growths are found.  Hepatitis C blood testing is recommended for all people born from 1945 through 1965 and any individual with known risks for hepatitis C.  Practice  safe sex. Use condoms and avoid high-risk sexual practices to reduce the spread of sexually transmitted infections (STIs). STIs include gonorrhea, chlamydia, syphilis, trichomonas, herpes, HPV, and human immunodeficiency virus (HIV). Herpes, HIV, and HPV are viral illnesses that have no cure. They can result in disability, cancer, and death. Sexually active women aged 25 and younger should be checked for chlamydia. Older women with new or multiple partners should also be tested for chlamydia. Testing for other STIs is recommended if you are sexually active and at increased risk.  Osteoporosis is a disease in which the bones lose minerals and strength with aging. This can result in serious bone fractures. The risk of osteoporosis can be identified using a bone density scan. Women ages 65 and over and women at risk for fractures or osteoporosis should discuss screening with their caregivers. Ask your caregiver whether you should take a calcium supplement or vitamin D to reduce the rate of osteoporosis.  Menopause can be associated with physical symptoms and risks. Hormone replacement therapy is available to decrease symptoms and risks. You should talk to your caregiver about whether hormone replacement therapy is right for you.  Use sunscreen with sun protection factor (SPF) of 30 or more. Apply sunscreen liberally and repeatedly throughout the day. You should seek shade when your shadow is shorter than you. Protect yourself by wearing long sleeves, pants, a wide-brimmed hat, and sunglasses year round, whenever you are outdoors.  Once a month, do a whole body skin exam, using a mirror to look at the skin on your back. Notify your caregiver of new moles, moles that have irregular borders, moles that are larger than a pencil eraser, or moles that have changed in shape or color.  Stay current with required immunizations.  Influenza. You need a dose every fall (or winter). The composition of the flu vaccine  changes each year, so being vaccinated once is not enough.  Pneumococcal polysaccharide. You need 1 to 2 doses if you smoke cigarettes or if you have certain chronic medical conditions. You need 1 dose at age 65 (or older) if you have never been vaccinated.  Tetanus, diphtheria, pertussis (Tdap, Td). Get 1 dose of Tdap vaccine if you are younger than age 65, are over 65 and have contact with an infant, are a healthcare worker, are pregnant, or simply want to be protected from whooping cough. After that, you need a Td   booster dose every 10 years. Consult your caregiver if you have not had at least 3 tetanus and diphtheria-containing shots sometime in your life or have a deep or dirty wound.  HPV. You need this vaccine if you are a woman age 26 or younger. The vaccine is given in 3 doses over 6 months.  Measles, mumps, rubella (MMR). You need at least 1 dose of MMR if you were born in 1957 or later. You may also need a second dose.  Meningococcal. If you are age 19 to 21 and a first-year college student living in a residence hall, or have one of several medical conditions, you need to get vaccinated against meningococcal disease. You may also need additional booster doses.  Zoster (shingles). If you are age 60 or older, you should get this vaccine.  Varicella (chickenpox). If you have never had chickenpox or you were vaccinated but received only 1 dose, talk to your caregiver to find out if you need this vaccine.  Hepatitis A. You need this vaccine if you have a specific risk factor for hepatitis A virus infection or you simply wish to be protected from this disease. The vaccine is usually given as 2 doses, 6 to 18 months apart.  Hepatitis B. You need this vaccine if you have a specific risk factor for hepatitis B virus infection or you simply wish to be protected from this disease. The vaccine is given in 3 doses, usually over 6 months. Preventive Services / Frequency Ages 19 to 39  Blood  pressure check.** / Every 1 to 2 years.  Lipid and cholesterol check.** / Every 5 years beginning at age 20.  Clinical breast exam.** / Every 3 years for women in their 20s and 30s.  Pap test.** / Every 2 years from ages 21 through 29. Every 3 years starting at age 30 through age 65 or 70 with a history of 3 consecutive normal Pap tests.  HPV screening.** / Every 3 years from ages 30 through ages 65 to 70 with a history of 3 consecutive normal Pap tests.  Hepatitis C blood test.** / For any individual with known risks for hepatitis C.  Skin self-exam. / Monthly.  Influenza immunization.** / Every year.  Pneumococcal polysaccharide immunization.** / 1 to 2 doses if you smoke cigarettes or if you have certain chronic medical conditions.  Tetanus, diphtheria, pertussis (Tdap, Td) immunization. / A one-time dose of Tdap vaccine. After that, you need a Td booster dose every 10 years.  HPV immunization. / 3 doses over 6 months, if you are 26 and younger.  Measles, mumps, rubella (MMR) immunization. / You need at least 1 dose of MMR if you were born in 1957 or later. You may also need a second dose.  Meningococcal immunization. / 1 dose if you are age 19 to 21 and a first-year college student living in a residence hall, or have one of several medical conditions, you need to get vaccinated against meningococcal disease. You may also need additional booster doses.  Varicella immunization.** / Consult your caregiver.  Hepatitis A immunization.** / Consult your caregiver. 2 doses, 6 to 18 months apart.  Hepatitis B immunization.** / Consult your caregiver. 3 doses usually over 6 months. Ages 40 to 64  Blood pressure check.** / Every 1 to 2 years.  Lipid and cholesterol check.** / Every 5 years beginning at age 20.  Clinical breast exam.** / Every year after age 40.  Mammogram.** / Every year beginning at age 40   and continuing for as long as you are in good health. Consult with your  caregiver.  Pap test.** / Every 3 years starting at age 30 through age 65 or 70 with a history of 3 consecutive normal Pap tests.  HPV screening.** / Every 3 years from ages 30 through ages 65 to 70 with a history of 3 consecutive normal Pap tests.  Fecal occult blood test (FOBT) of stool. / Every year beginning at age 50 and continuing until age 75. You may not need to do this test if you get a colonoscopy every 10 years.  Flexible sigmoidoscopy or colonoscopy.** / Every 5 years for a flexible sigmoidoscopy or every 10 years for a colonoscopy beginning at age 50 and continuing until age 75.  Hepatitis C blood test.** / For all people born from 1945 through 1965 and any individual with known risks for hepatitis C.  Skin self-exam. / Monthly.  Influenza immunization.** / Every year.  Pneumococcal polysaccharide immunization.** / 1 to 2 doses if you smoke cigarettes or if you have certain chronic medical conditions.  Tetanus, diphtheria, pertussis (Tdap, Td) immunization.** / A one-time dose of Tdap vaccine. After that, you need a Td booster dose every 10 years.  Measles, mumps, rubella (MMR) immunization. / You need at least 1 dose of MMR if you were born in 1957 or later. You may also need a second dose.  Varicella immunization.** / Consult your caregiver.  Meningococcal immunization.** / Consult your caregiver.  Hepatitis A immunization.** / Consult your caregiver. 2 doses, 6 to 18 months apart.  Hepatitis B immunization.** / Consult your caregiver. 3 doses, usually over 6 months. Ages 65 and over  Blood pressure check.** / Every 1 to 2 years.  Lipid and cholesterol check.** / Every 5 years beginning at age 20.  Clinical breast exam.** / Every year after age 40.  Mammogram.** / Every year beginning at age 40 and continuing for as long as you are in good health. Consult with your caregiver.  Pap test.** / Every 3 years starting at age 30 through age 65 or 70 with a 3  consecutive normal Pap tests. Testing can be stopped between 65 and 70 with 3 consecutive normal Pap tests and no abnormal Pap or HPV tests in the past 10 years.  HPV screening.** / Every 3 years from ages 30 through ages 65 or 70 with a history of 3 consecutive normal Pap tests. Testing can be stopped between 65 and 70 with 3 consecutive normal Pap tests and no abnormal Pap or HPV tests in the past 10 years.  Fecal occult blood test (FOBT) of stool. / Every year beginning at age 50 and continuing until age 75. You may not need to do this test if you get a colonoscopy every 10 years.  Flexible sigmoidoscopy or colonoscopy.** / Every 5 years for a flexible sigmoidoscopy or every 10 years for a colonoscopy beginning at age 50 and continuing until age 75.  Hepatitis C blood test.** / For all people born from 1945 through 1965 and any individual with known risks for hepatitis C.  Osteoporosis screening.** / A one-time screening for women ages 65 and over and women at risk for fractures or osteoporosis.  Skin self-exam. / Monthly.  Influenza immunization.** / Every year.  Pneumococcal polysaccharide immunization.** / 1 dose at age 65 (or older) if you have never been vaccinated.  Tetanus, diphtheria, pertussis (Tdap, Td) immunization. / A one-time dose of Tdap vaccine if you are over   65 and have contact with an infant, are a healthcare worker, or simply want to be protected from whooping cough. After that, you need a Td booster dose every 10 years.  Varicella immunization.** / Consult your caregiver.  Meningococcal immunization.** / Consult your caregiver.  Hepatitis A immunization.** / Consult your caregiver. 2 doses, 6 to 18 months apart.  Hepatitis B immunization.** / Check with your caregiver. 3 doses, usually over 6 months. ** Family history and personal history of risk and conditions may change your caregiver's recommendations. Document Released: 06/05/2001 Document Revised: 07/02/2011  Document Reviewed: 09/04/2010 [employer] Patient Information 2014 [employer].  

## 2013-01-08 ENCOUNTER — Encounter: Payer: Self-pay | Admitting: Family Medicine

## 2013-01-08 ENCOUNTER — Ambulatory Visit (HOSPITAL_BASED_OUTPATIENT_CLINIC_OR_DEPARTMENT_OTHER)
Admission: RE | Admit: 2013-01-08 | Discharge: 2013-01-08 | Disposition: A | Discharge status: Home / Self Care | Payer: BC Managed Care – PPO | Source: Ambulatory Visit | Attending: Family Medicine | Admitting: Family Medicine

## 2013-01-08 ENCOUNTER — Ambulatory Visit (HOSPITAL_BASED_OUTPATIENT_CLINIC_OR_DEPARTMENT_OTHER): Payer: BC Managed Care – PPO

## 2013-01-08 DIAGNOSIS — R591 Generalized enlarged lymph nodes: Secondary | ICD-10-CM

## 2013-01-08 DIAGNOSIS — R599 Enlarged lymph nodes, unspecified: Secondary | ICD-10-CM | POA: Insufficient documentation

## 2013-01-09 ENCOUNTER — Other Ambulatory Visit: Payer: Self-pay

## 2013-01-09 MED ORDER — ATORVASTATIN CALCIUM 10 MG PO TABS: 10.0000 mg | ORAL_TABLET | Freq: Every day | ORAL | Status: DC

## 2013-01-09 NOTE — Progress Notes (Signed)
Quick Note:  Called the patient at and the line was [phone] (Home) ______

## 2013-01-15 ENCOUNTER — Encounter: Payer: BC Managed Care – PPO | Admitting: Family Medicine

## 2013-01-20 ENCOUNTER — Other Ambulatory Visit (INDEPENDENT_AMBULATORY_CARE_PROVIDER_SITE_OTHER): Payer: BC Managed Care – PPO

## 2013-01-20 DIAGNOSIS — Z0000 Encounter for general adult medical examination without abnormal findings: Secondary | ICD-10-CM

## 2013-01-21 LAB — FECAL OCCULT BLOOD, IMMUNOCHEMICAL: Fecal Occult Bld: POSITIVE

## 2013-01-27 ENCOUNTER — Other Ambulatory Visit: Payer: Self-pay

## 2013-01-27 DIAGNOSIS — R195 Other fecal abnormalities: Secondary | ICD-10-CM

## 2013-02-05 ENCOUNTER — Ambulatory Visit (INDEPENDENT_AMBULATORY_CARE_PROVIDER_SITE_OTHER): Payer: BC Managed Care – PPO | Admitting: Family Medicine

## 2013-02-05 ENCOUNTER — Encounter: Payer: Self-pay | Admitting: Family Medicine

## 2013-02-05 VITALS — BP 108/90 | HR 69 | Temp 98.1°F | Wt 181.0 lb

## 2013-02-05 DIAGNOSIS — R300 Dysuria: Secondary | ICD-10-CM

## 2013-02-05 DIAGNOSIS — R109 Unspecified abdominal pain: Secondary | ICD-10-CM

## 2013-02-05 LAB — POCT URINALYSIS DIPSTICK
Bilirubin, UA: NEGATIVE
Blood, UA: NEGATIVE
Glucose, UA: NEGATIVE
Ketones, UA: NEGATIVE
Leukocytes, UA: NEGATIVE
Nitrite, UA: NEGATIVE
Protein, UA: NEGATIVE
Spec Grav, UA: 1.01
Urobilinogen, UA: 0.2
pH, UA: 6.5

## 2013-02-05 MED ORDER — CIPROFLOXACIN HCL 250 MG PO TABS: 250.0000 mg | ORAL_TABLET | Freq: Two times a day (BID) | ORAL | Status: DC

## 2013-02-05 NOTE — Patient Instructions (Signed)

## 2013-02-05 NOTE — Progress Notes (Signed)
  Subjective:    Angela Garner is a 52 y.o. female who complains of abnormal smelling urine, burning with urination and suprapubic pressure. She has had symptoms for several days. Patient also complains of stomach ache. Patient denies back pain, congestion, cough, fever, headache, rhinitis, sorethroat and vaginal discharge. Patient does not have a history of recurrent UTI. Patient does not have a history of pyelonephritis.   The following portions of the patient's history were reviewed and updated as appropriate: allergies, current medications, past family history, past medical history, past social history, past surgical history and problem list.  Review of Systems Pertinent items are noted in HPI.    Objective:    BP 108/90  Pulse 69  Temp(Src) 98.1 F (36.7 C) (Oral)  Wt 181 lb (82.101 kg)  BMI 27.53 kg/m2  SpO2 98% General appearance: alert, cooperative, appears stated age and no distress Abdomen: soft, non-tender; bowel sounds normal; no masses,  no organomegaly  Laboratory:  Urine dipstick: negative for all components.   Micro exam: not done.    Assessment:    dysuria     Plan:    Medications: ciprofloxacin. Maintain adequate hydration. Follow up if symptoms not improving, and as needed.

## 2013-02-20 ENCOUNTER — Encounter: Payer: Self-pay | Admitting: Internal Medicine

## 2013-03-17 ENCOUNTER — Ambulatory Visit (INDEPENDENT_AMBULATORY_CARE_PROVIDER_SITE_OTHER): Payer: BC Managed Care – PPO | Admitting: Family Medicine

## 2013-03-17 ENCOUNTER — Encounter: Payer: Self-pay | Admitting: Family Medicine

## 2013-03-17 VITALS — BP 110/70 | HR 74 | Resp 16 | Wt 179.0 lb

## 2013-03-17 DIAGNOSIS — I10 Essential (primary) hypertension: Secondary | ICD-10-CM

## 2013-03-17 DIAGNOSIS — E785 Hyperlipidemia, unspecified: Secondary | ICD-10-CM

## 2013-03-17 DIAGNOSIS — R200 Anesthesia of skin: Secondary | ICD-10-CM

## 2013-03-17 DIAGNOSIS — E1149 Type 2 diabetes mellitus with other diabetic neurological complication: Secondary | ICD-10-CM

## 2013-03-17 DIAGNOSIS — R209 Unspecified disturbances of skin sensation: Secondary | ICD-10-CM

## 2013-03-17 DIAGNOSIS — G5600 Carpal tunnel syndrome, unspecified upper limb: Secondary | ICD-10-CM | POA: Insufficient documentation

## 2013-03-17 HISTORY — DX: Carpal tunnel syndrome, unspecified upper limb: G56.00

## 2013-03-17 MED ORDER — AMLODIPINE-OLMESARTAN 5-40 MG PO TABS: 1.0000 | ORAL_TABLET | Freq: Every day | ORAL | Status: DC

## 2013-03-17 NOTE — Assessment & Plan Note (Signed)
R > L Pt will try splint for R arm first and e will give her a L on as well Refer to hand surgeon prn

## 2013-03-17 NOTE — Patient Instructions (Signed)

## 2013-03-17 NOTE — Progress Notes (Signed)
Pre visit review using our clinic review tool, if applicable. No additional management support is needed unless otherwise documented below in the visit note. 

## 2013-03-17 NOTE — Progress Notes (Signed)
  Subjective:    Patient ID: Angela Garner, female    DOB: 01/28/1961, 52 y.o.   MRN: [id number]  HPI Pt here c/o numbness in both hands especially at night and am.  Affects mostly thumb index and middle fingers.       Review of Systems As above    Objective:   Physical Exam  BP 110/70  Pulse 74  Resp 16  Wt 179 lb (81.194 kg)  SpO2 99% General appearance: alert, cooperative, appears stated age and no distress Throat: lips, mucosa, and tongue normal; teeth and gums normal Neck: no adenopathy, supple, symmetrical, trachea midline and thyroid not enlarged, symmetric, no tenderness/mass/nodules Lungs: clear to auscultation bilaterally Heart: S1, S2 normal Extremities: extremities normal, atraumatic, no cyanosis or edema Neurologic: Sensory: numbness and tingling with flexion wrist--  R > L      Assessment & Plan:

## 2013-03-17 NOTE — Assessment & Plan Note (Signed)
Running low Dec to azor 5/40 F/u 2-3 week

## 2013-04-08 ENCOUNTER — Encounter: Payer: Self-pay | Admitting: Family Medicine

## 2013-04-08 ENCOUNTER — Ambulatory Visit (INDEPENDENT_AMBULATORY_CARE_PROVIDER_SITE_OTHER): Payer: BC Managed Care – PPO | Admitting: Family Medicine

## 2013-04-08 VITALS — BP 114/68 | HR 70 | Temp 98.3°F | Wt 175.0 lb

## 2013-04-08 DIAGNOSIS — E785 Hyperlipidemia, unspecified: Secondary | ICD-10-CM

## 2013-04-08 DIAGNOSIS — E1165 Type 2 diabetes mellitus with hyperglycemia: Secondary | ICD-10-CM

## 2013-04-08 DIAGNOSIS — E1149 Type 2 diabetes mellitus with other diabetic neurological complication: Secondary | ICD-10-CM

## 2013-04-08 DIAGNOSIS — E119 Type 2 diabetes mellitus without complications: Secondary | ICD-10-CM

## 2013-04-08 DIAGNOSIS — I10 Essential (primary) hypertension: Secondary | ICD-10-CM

## 2013-04-08 LAB — BASIC METABOLIC PANEL
BUN: 20 mg/dL (ref 6–23)
CO2: 32 mEq/L (ref 19–32)
Calcium: 9.4 mg/dL (ref 8.4–10.5)
Chloride: 104 mEq/L (ref 96–112)
Creatinine, Ser: 0.8 mg/dL (ref 0.4–1.2)
GFR: 94.93 mL/min (ref >60.00)
Glucose, Bld: 115 mg/dL — ABNORMAL HIGH (ref 70–99)
Potassium: 3.6 mEq/L (ref 3.5–5.1)
Sodium: 142 mEq/L (ref 135–145)

## 2013-04-08 LAB — LIPID PANEL
Cholesterol: 147 mg/dL (ref 0–200)
HDL: 72.3 mg/dL (ref >39.00)
LDL Cholesterol: 69 mg/dL (ref 0–99)
Total CHOL/HDL Ratio: 2
Triglycerides: 28 mg/dL (ref 0.0–149.0)
VLDL: 5.6 mg/dL (ref 0.0–40.0)

## 2013-04-08 LAB — HEPATIC FUNCTION PANEL
ALT: 28 U/L (ref 0–35)
AST: 24 U/L (ref 0–37)
Albumin: 4.5 g/dL (ref 3.5–5.2)
Alkaline Phosphatase: 65 U/L (ref 39–117)
Bilirubin, Direct: 0.1 mg/dL (ref 0.0–0.3)
Total Bilirubin: 0.9 mg/dL (ref 0.3–1.2)
Total Protein: 6.8 g/dL (ref 6.0–8.3)

## 2013-04-08 LAB — HEMOGLOBIN A1C: Hgb A1c MFr Bld: 6.3 % (ref 4.6–6.5)

## 2013-04-08 MED ORDER — AMLODIPINE-OLMESARTAN 5-20 MG PO TABS: 1.0000 | ORAL_TABLET | Freq: Every day | ORAL | Status: DC

## 2013-04-08 NOTE — Progress Notes (Signed)
Pre visit review using our clinic review tool, if applicable. No additional management support is needed unless otherwise documented below in the visit note. 

## 2013-04-08 NOTE — Assessment & Plan Note (Signed)
Running low Dec med per meds and orders

## 2013-04-08 NOTE — Assessment & Plan Note (Signed)
Check labs con't meds 

## 2013-04-08 NOTE — Patient Instructions (Signed)

## 2013-04-08 NOTE — Progress Notes (Signed)
   Subjective:    Patient ID: Angela Garner, female    DOB: 1960/09/24, 52 y.o.   MRN: [id number]  HPI  HPI HYPERTENSION  Blood pressure range-good  130/78   Chest pain- no      Dyspnea- no Lightheadedness- no   Edema- no Other side effects - no   Medication compliance: good Low salt diet- yes  DIABETES  Blood Sugar ranges-90-110  Polyuria- no New Visual problems- no Hypoglycemic symptoms- no Other side effects-no Medication compliance - good Last eye exam- 10/2012 Foot exam- today  HYPERLIPIDEMIA  Medication compliance- good RUQ pain- no  Muscle aches- no Other side effects-no  ROS See HPI above   PMH Smoking Status noted        Review of Systems As above    Objective:   Physical Exam  BP 114/68  Pulse 70  Temp(Src) 98.3 F (36.8 C) (Oral)  Wt 175 lb (79.379 kg)  SpO2 98% General appearance: alert, cooperative, appears stated age and no distress Throat: lips, mucosa, and tongue normal; teeth and gums normal Neck: no adenopathy, no carotid bruit, no JVD, supple, symmetrical, trachea midline and thyroid not enlarged, symmetric, no tenderness/mass/nodules Lungs: clear to auscultation bilaterally Heart: S1, S2 normal Extremities: extremities normal, atraumatic, no cyanosis or edema      Assessment & Plan:

## 2013-04-08 NOTE — Assessment & Plan Note (Signed)
Stable Cont meds 

## 2013-04-27 ENCOUNTER — Ambulatory Visit (AMBULATORY_SURGERY_CENTER): Payer: Self-pay | Admitting: *Deleted

## 2013-04-27 VITALS — Ht 68.0 in | Wt 182.4 lb

## 2013-04-27 DIAGNOSIS — Z1211 Encounter for screening for malignant neoplasm of colon: Secondary | ICD-10-CM

## 2013-04-27 MED ORDER — NA SULFATE-K SULFATE-MG SULF 17.5-3.13-1.6 GM/177ML PO SOLN: 1.0000 | Freq: Once | ORAL | Status: DC

## 2013-04-27 NOTE — Progress Notes (Signed)
No egg or soy allergy. No anesthesia problems.  

## 2013-04-28 ENCOUNTER — Telehealth: Payer: Self-pay | Admitting: Family Medicine

## 2013-04-28 ENCOUNTER — Other Ambulatory Visit: Payer: Self-pay | Admitting: Family Medicine

## 2013-04-28 DIAGNOSIS — J209 Acute bronchitis, unspecified: Secondary | ICD-10-CM

## 2013-04-28 DIAGNOSIS — I10 Essential (primary) hypertension: Secondary | ICD-10-CM

## 2013-04-28 MED ORDER — AMLODIPINE-OLMESARTAN 5-20 MG PO TABS: 1.0000 | ORAL_TABLET | Freq: Every day | ORAL | Status: DC

## 2013-04-28 NOTE — Telephone Encounter (Signed)
Patient is calling to request more samples of amLODipine-olmesartan (AZOR) 5-20 MG. If none are available she states that we can send rx to [facility] on [address]. Please advise.

## 2013-04-28 NOTE — Telephone Encounter (Signed)
Please advise      KP 

## 2013-04-28 NOTE — Telephone Encounter (Signed)
Send to pharmacy--- I don't think we have any

## 2013-04-28 NOTE — Telephone Encounter (Signed)
Rx Faxed    KP 

## 2013-04-29 ENCOUNTER — Telehealth: Payer: Self-pay

## 2013-04-29 NOTE — Telephone Encounter (Signed)
Suprep sample kit put up front for patient to pick up, Kit was going to be $ 89.00 and she could not afford to pay that.

## 2013-04-29 NOTE — Telephone Encounter (Signed)
Left message on patient's mobile # to call me back to discuss her suprep rx.  We don't do prior authorization's on these.

## 2013-05-11 ENCOUNTER — Ambulatory Visit (AMBULATORY_SURGERY_CENTER): Payer: BC Managed Care – PPO | Admitting: Internal Medicine

## 2013-05-11 ENCOUNTER — Encounter: Payer: Self-pay | Admitting: Internal Medicine

## 2013-05-11 VITALS — BP 115/75 | HR 52 | Temp 97.2°F | Resp 15 | Ht 68.0 in | Wt 182.0 lb

## 2013-05-11 DIAGNOSIS — Z1211 Encounter for screening for malignant neoplasm of colon: Secondary | ICD-10-CM

## 2013-05-11 MED ORDER — SODIUM CHLORIDE 0.9 % IV SOLN: 500.0000 mL | INTRAVENOUS | Status: DC

## 2013-05-11 NOTE — Patient Instructions (Addendum)
Your colonoscopy was normal.  Next routine colonoscopy in 10 years - 2025  I appreciate the opportunity to care for you. Gatha Mayer, MD, FACG    YOU HAD AN ENDOSCOPIC PROCEDURE TODAY AT [facility]: Refer to the procedure report that was given to you for any specific questions about what was found during the examination.  If the procedure report does not answer your questions, please call your gastroenterologist to clarify.  If you requested that your care partner not be given the details of your procedure findings, then the procedure report has been included in a sealed envelope for you to review at your convenience later.  YOU SHOULD EXPECT: Some feelings of bloating in the abdomen. Passage of more gas than usual.  Walking can help get rid of the air that was put into your GI tract during the procedure and reduce the bloating. If you had a lower endoscopy (such as a colonoscopy or flexible sigmoidoscopy) you may notice spotting of blood in your stool or on the toilet paper. If you underwent a bowel prep for your procedure, then you may not have a normal bowel movement for a few days.  DIET: Your first meal following the procedure should be a light meal and then it is ok to progress to your normal diet.  A half-sandwich or bowl of soup is an example of a good first meal.  Heavy or fried foods are harder to digest and may make you feel nauseous or bloated.  Likewise meals heavy in dairy and vegetables can cause extra gas to form and this can also increase the bloating.  Drink plenty of fluids but you should avoid alcoholic beverages for 24 hours.  ACTIVITY: Your care partner should take you home directly after the procedure.  You should plan to take it easy, moving slowly for the rest of the day.  You can resume normal activity the day after the procedure however you should NOT DRIVE or use heavy machinery for 24 hours (because of the sedation medicines used during the test).     SYMPTOMS TO REPORT IMMEDIATELY: A gastroenterologist can be reached at any hour.  During normal business hours, 8:30 AM to 5:00 PM Monday through Friday, call [phone].  After hours and on weekends, please call the GI answering service at [phone] who will take a message and have the physician on call contact you.   Following lower endoscopy (colonoscopy or flexible sigmoidoscopy):  Excessive amounts of blood in the stool  Significant tenderness or worsening of abdominal pains  Swelling of the abdomen that is new, acute  Fever of 100F or higher   FOLLOW UP: If any biopsies were taken you will be contacted by phone or by letter within the next 1-3 weeks.  Call your gastroenterologist if you have not heard about the biopsies in 3 weeks.  Our staff will call the home number listed on your records the next business day following your procedure to check on you and address any questions or concerns that you may have at that time regarding the information given to you following your procedure. This is a courtesy call and so if there is no answer at the home number and we have not heard from you through the emergency physician on call, we will assume that you have returned to your regular daily activities without incident.  SIGNATURES/CONFIDENTIALITY: You and/or your care partner have signed paperwork which will be entered into your electronic medical record.  These signatures attest to the fact that that the information above on your After Visit Summary has been reviewed and is understood.  Full responsibility of the confidentiality of this discharge information lies with you and/or your care-partner.   You may resume your current medications today. Please call if any questions or concerns.

## 2013-05-11 NOTE — Progress Notes (Signed)
No problems noted. maw

## 2013-05-11 NOTE — Op Note (Signed)
[facility]  [address] [place], [id number]   COLONOSCOPY PROCEDURE REPORT  PATIENT: Angela Garner, Naser  MR#: [id number] BIRTHDATE: 09-07-1960 , 78  yrs. old GENDER: Female ENDOSCOPIST: Gatha Mayer, MD, [facility] REFERRED YI:FOYDXA Lowne, DO PROCEDURE DATE:  05/11/2013 PROCEDURE:   Colonoscopy, screening First Screening Colonoscopy - Avg.  risk and is 50 yrs.  old or older Yes.  Prior Negative Screening - Now for repeat screening. N/A  History of Adenoma - Now for follow-up colonoscopy & has been > or = to 3 yrs.  N/A  Polyps Removed Today? No.  Recommend repeat exam, <10 yrs? No. ASA CLASS:   Class II INDICATIONS:average risk screening.   first colonoscopy MEDICATIONS: propofol (Diprivan) 250mg  IV, MAC sedation, administered by CRNA, and These medications were titrated to patient response per physician's verbal order  DESCRIPTION OF PROCEDURE:   After the risks benefits and alternatives of the procedure were thoroughly explained, informed consent was obtained.  A digital rectal exam revealed no abnormalities of the rectum.   The LB [id number]  endoscope was introduced through the anus and advanced to the cecum, which was identified by both the appendix and ileocecal valve. No adverse events experienced.   The quality of the prep was excellent using Suprep  The instrument was then slowly withdrawn as the colon was fully examined.      COLON FINDINGS: A normal appearing cecum, ileocecal valve, and appendiceal orifice were identified.  The ascending, hepatic flexure, transverse, splenic flexure, descending, sigmoid colon and rectum appeared unremarkable.  No polyps or cancers were seen. Retroflexed views revealed no abnormalities. The time to cecum=3 minutes 05 seconds.  Withdrawal time=10 minutes 31 seconds.  The scope was withdrawn and the procedure completed. COMPLICATIONS: There were no complications.  ENDOSCOPIC IMPRESSION: Normal colonoscopy -  first colonoscopy - excellent prep  RECOMMENDATIONS: Repeat routine  colonoscopy 10 years - 2025   eSigned:  Gatha Mayer, MD, [facility] 05/11/2013 8:34 AM   cc: Rosalita Chessman, DO and The Patient

## 2013-05-11 NOTE — Progress Notes (Signed)
A/ox3 pleased with MAC, report to Annette RN 

## 2013-05-12 ENCOUNTER — Telehealth: Payer: Self-pay | Admitting: *Deleted

## 2013-05-12 NOTE — Telephone Encounter (Signed)
Name identifier, left message, follow-up 

## 2013-05-21 ENCOUNTER — Ambulatory Visit (INDEPENDENT_AMBULATORY_CARE_PROVIDER_SITE_OTHER): Payer: BC Managed Care – PPO | Admitting: Family Medicine

## 2013-05-21 ENCOUNTER — Telehealth: Payer: Self-pay

## 2013-05-21 VITALS — BP 140/88 | HR 68 | Temp 98.4°F | Resp 16 | Ht 68.0 in | Wt 181.8 lb

## 2013-05-21 DIAGNOSIS — J329 Chronic sinusitis, unspecified: Secondary | ICD-10-CM

## 2013-05-21 DIAGNOSIS — J069 Acute upper respiratory infection, unspecified: Secondary | ICD-10-CM

## 2013-05-21 MED ORDER — HYDROCODONE-HOMATROPINE 5-1.5 MG/5ML PO SYRP: 5.0000 mL | ORAL_SOLUTION | ORAL | Status: DC | PRN

## 2013-05-21 MED ORDER — AZITHROMYCIN 250 MG PO TABS: ORAL_TABLET | ORAL | Status: DC

## 2013-05-21 NOTE — Progress Notes (Signed)
Subjective: 53 year old lady with head congestion. She's been having problems all week. She's not been running a fever. She had a runny nose and has had some cough. Now she has more sinus pressure and a sinus headache. She does not smoke. The trouble smells in the building where she works does bother her. She does not have any earache. Has brought some bloody mucus up. She had a flu shot this season.  Objective: Pleasant alert lady in no major distress. Her TMs are normal. Throat clear. She is a little tender over her sinuses in the maxillary region. Her chest is clear to auscultation. Heart regular without murmurs.  Assessment: Upper respiratory infection with sinusitis symptoms  Plan: Azithromycin Continue the corcedin that she has at home

## 2013-05-21 NOTE — Telephone Encounter (Signed)
Patient left vm on triage line. Patient was requesting an appt for today. Checked appts and patient is being seen at [facility]. Left message apologizing for not returning her call sooner. Advised that she was in good hands but if she needed [place] to please call.

## 2013-05-21 NOTE — Patient Instructions (Addendum)
Drink plenty of fluids and get enough rest  Take the antibiotic as directed, 2 pills initially then one daily for 4 days  Continue using the antihistamine decongestant medication you have at home

## 2013-06-22 ENCOUNTER — Other Ambulatory Visit: Payer: Self-pay | Admitting: Family Medicine

## 2013-07-02 ENCOUNTER — Ambulatory Visit (INDEPENDENT_AMBULATORY_CARE_PROVIDER_SITE_OTHER): Payer: BC Managed Care – PPO | Admitting: Family Medicine

## 2013-07-02 ENCOUNTER — Encounter: Payer: Self-pay | Admitting: Family Medicine

## 2013-07-02 VITALS — BP 122/80 | HR 65 | Temp 98.4°F | Wt 181.0 lb

## 2013-07-02 DIAGNOSIS — E1159 Type 2 diabetes mellitus with other circulatory complications: Secondary | ICD-10-CM

## 2013-07-02 DIAGNOSIS — E785 Hyperlipidemia, unspecified: Secondary | ICD-10-CM

## 2013-07-02 DIAGNOSIS — R002 Palpitations: Secondary | ICD-10-CM

## 2013-07-02 DIAGNOSIS — R350 Frequency of micturition: Secondary | ICD-10-CM

## 2013-07-02 DIAGNOSIS — I10 Essential (primary) hypertension: Secondary | ICD-10-CM

## 2013-07-02 DIAGNOSIS — G4700 Insomnia, unspecified: Secondary | ICD-10-CM

## 2013-07-02 LAB — LIPID PANEL
Cholesterol: 187 mg/dL (ref 0–200)
HDL: 84.3 mg/dL (ref >39.00)
LDL Cholesterol: 94 mg/dL (ref 0–99)
Total CHOL/HDL Ratio: 2
Triglycerides: 43 mg/dL (ref 0.0–149.0)
VLDL: 8.6 mg/dL (ref 0.0–40.0)

## 2013-07-02 LAB — POCT URINALYSIS DIPSTICK
Bilirubin, UA: NEGATIVE
Blood, UA: NEGATIVE
Glucose, UA: NEGATIVE
Ketones, UA: NEGATIVE
Nitrite, UA: NEGATIVE
Protein, UA: NEGATIVE
Spec Grav, UA: 1.01
Urobilinogen, UA: 0.2
pH, UA: 7

## 2013-07-02 LAB — BASIC METABOLIC PANEL
BUN: 20 mg/dL (ref 6–23)
CO2: 32 mEq/L (ref 19–32)
Calcium: 9.6 mg/dL (ref 8.4–10.5)
Chloride: 103 mEq/L (ref 96–112)
Creatinine, Ser: 0.8 mg/dL (ref 0.4–1.2)
GFR: 100.56 mL/min (ref >60.00)
Glucose, Bld: 90 mg/dL (ref 70–99)
Potassium: 3.7 mEq/L (ref 3.5–5.1)
Sodium: 141 mEq/L (ref 135–145)

## 2013-07-02 LAB — HM DIABETES FOOT EXAM

## 2013-07-02 LAB — HEPATIC FUNCTION PANEL
ALT: 30 U/L (ref 0–35)
AST: 27 U/L (ref 0–37)
Albumin: 4.4 g/dL (ref 3.5–5.2)
Alkaline Phosphatase: 74 U/L (ref 39–117)
Bilirubin, Direct: 0 mg/dL (ref 0.0–0.3)
Total Bilirubin: 1 mg/dL (ref 0.3–1.2)
Total Protein: 7 g/dL (ref 6.0–8.3)

## 2013-07-02 LAB — CBC WITH DIFFERENTIAL/PLATELET
Basophils Absolute: 0.1 10*3/uL (ref 0.0–0.1)
Basophils Relative: 1.1 % (ref 0.0–3.0)
Eosinophils Absolute: 0.1 10*3/uL (ref 0.0–0.7)
Eosinophils Relative: 1.6 % (ref 0.0–5.0)
HCT: 44.2 % (ref 36.0–46.0)
Hemoglobin: 14.9 g/dL (ref 12.0–15.0)
Lymphocytes Relative: 53.5 % — ABNORMAL HIGH (ref 12.0–46.0)
Lymphs Abs: 2.6 10*3/uL (ref 0.7–4.0)
MCHC: 33.7 g/dL (ref 30.0–36.0)
MCV: 92.7 fl (ref 78.0–100.0)
Monocytes Absolute: 0.5 10*3/uL (ref 0.1–1.0)
Monocytes Relative: 10.1 % (ref 3.0–12.0)
Neutro Abs: 1.7 10*3/uL (ref 1.4–7.7)
Neutrophils Relative %: 33.7 % — ABNORMAL LOW (ref 43.0–77.0)
Platelets: 187 10*3/uL (ref 150.0–400.0)
RBC: 4.76 Mil/uL (ref 3.87–5.11)
RDW: 13.8 % (ref 11.5–14.6)
WBC: 4.9 10*3/uL (ref 4.5–10.5)

## 2013-07-02 LAB — HEMOGLOBIN A1C: Hgb A1c MFr Bld: 6.2 % (ref 4.6–6.5)

## 2013-07-02 LAB — TSH: TSH: 0.57 u[IU]/mL (ref 0.35–5.50)

## 2013-07-02 MED ORDER — AMLODIPINE-OLMESARTAN 5-20 MG PO TABS: ORAL_TABLET | ORAL | Status: DC

## 2013-07-02 MED ORDER — SUVOREXANT 10 MG PO TABS: 1.0000 | ORAL_TABLET | Freq: Every evening | ORAL | Status: DC | PRN

## 2013-07-02 NOTE — Progress Notes (Signed)
Pre visit review using our clinic review tool, if applicable. No additional management support is needed unless otherwise documented below in the visit note. 

## 2013-07-02 NOTE — Patient Instructions (Signed)

## 2013-07-02 NOTE — Progress Notes (Signed)
Patient ID: Angela Garner, female   DOB: Jan 17, 1961, 53 y.o.   MRN: [id number]   Subjective:    Patient ID: Angela Garner, female    DOB: [birth year], 53 y.o.   MRN: [id number] HPI Pt here f/u diabetes and htn.  She is having palpitations. No sob.  She is under a lot of stress at home and work.    HPI HYPERTENSION  Blood pressure range-running high at home--didn;t bring cuff  Chest pain- no      Dyspnea- no Lightheadedness- no   Edema- no Other side effects - no   Medication compliance: good Low salt diet- yes  DIABETES  Blood Sugar ranges-good per pt  Polyuria- yea New Visual problems- no Hypoglycemic symptoms- no Other side effects-no Medication compliance - good Last eye exam- due Foot exam- today  HYPERLIPIDEMIA  Medication compliance- good RUQ pain- no  Muscle aches- no Other side effects-no  ROS See HPI above   PMH Smoking Status noted        Objective:    BP 122/80  Pulse 65  Temp(Src) 98.4 F (36.9 C) (Oral)  Wt 181 lb (82.101 kg)  SpO2 98% General appearance: alert, cooperative, appears stated age and no distress Ears: normal TM's and external ear canals both ears Nose: Nares normal. Septum midline. Mucosa normal. No drainage or sinus tenderness. Throat: lips, mucosa, and tongue normal; teeth and gums normal Neck: no adenopathy, no carotid bruit, no JVD, supple, symmetrical, trachea midline and thyroid not enlarged, symmetric, no tenderness/mass/nodules Lungs: clear to auscultation bilaterally Heart: regular rate and rhythm, S1, S2 normal, no murmur, click, rub or gallop Extremities: extremities normal, atraumatic, no cyanosis or edema Sensory exam of the foot is normal, tested with the monofilament. Good pulses, no lesions or ulcers, good peripheral pulses.        Assessment & Plan:  1. Type II or unspecified type diabetes mellitus with peripheral circulatory disorders, uncontrolled(250.72) Check labs, con't meds - Hemoglobin A1c - Microalbumin /  creatinine urine ratio - POCT urinalysis dipstick - TSH  2. Other and unspecified hyperlipidemia Check labs - Hepatic function panel - Lipid panel - TSH  3. HTN (hypertension) Stable, con't meds - Basic metabolic panel - CBC with Differential - TSH - amLODipine-olmesartan (AZOR) 5-20 MG per tablet; TAKE ONE TABLET BY MOUTH EVERY DAY  Dispense: 30 tablet; Refill: 5 - 2D Echocardiogram without contrast; Future  4. Palpitations Check labs - TSH - EKG 12-Lead - 2D Echocardiogram without contrast; Future  5. Insomnia belsorma 10 mg 1 po qhs  - Suvorexant (BELSOMRA) 10 MG TABS; Take 1 tablet by mouth at bedtime as needed.  Dispense: 10 tablet; Refill: 0  6. Frequency of urination Check urine - Urine Culture

## 2013-07-03 ENCOUNTER — Telehealth: Payer: Self-pay | Admitting: Family Medicine

## 2013-07-03 LAB — MICROALBUMIN / CREATININE URINE RATIO
Creatinine,U: 118.8 mg/dL
Microalb Creat Ratio: 0.2 mg/g (ref 0.0–30.0)
Microalb, Ur: 0.2 mg/dL (ref 0.0–1.9)

## 2013-07-03 NOTE — Telephone Encounter (Signed)
Relevant patient education assigned to patient using Emmi. ° °

## 2013-07-04 LAB — URINE CULTURE
Colony Count: NO GROWTH
Organism ID, Bacteria: NO GROWTH

## 2013-07-09 ENCOUNTER — Telehealth: Payer: Self-pay

## 2013-07-09 NOTE — Telephone Encounter (Signed)
Relevant patient education assigned to patient using Emmi. ° °

## 2013-07-17 ENCOUNTER — Other Ambulatory Visit (HOSPITAL_COMMUNITY): Payer: BC Managed Care – PPO

## 2014-01-24 ENCOUNTER — Other Ambulatory Visit: Payer: Self-pay | Admitting: Family Medicine

## 2014-02-03 LAB — HM MAMMOGRAPHY: HM Mammogram: NEGATIVE

## 2014-02-15 ENCOUNTER — Telehealth: Payer: Self-pay | Admitting: Family Medicine

## 2014-02-15 NOTE — Telephone Encounter (Signed)
Caller name: Antwanette Relation to pt: Call back number:[phone] Pharmacy:  Reason for call:  Pt is wanting coupons for Rx amLODipine-olmesartan (AZOR) 5-20 MG per tablet.  Pt is out.

## 2014-02-15 NOTE — Telephone Encounter (Signed)
No discounts cards available at the office. I left the patient a message to print a coupon on line at company website.     KP

## 2014-03-09 ENCOUNTER — Ambulatory Visit (INDEPENDENT_AMBULATORY_CARE_PROVIDER_SITE_OTHER): Payer: BC Managed Care – PPO | Admitting: Family Medicine

## 2014-03-09 ENCOUNTER — Encounter: Payer: Self-pay | Admitting: Family Medicine

## 2014-03-09 VITALS — BP 122/90 | HR 72 | Temp 98.0°F | Wt 189.0 lb

## 2014-03-09 DIAGNOSIS — E1165 Type 2 diabetes mellitus with hyperglycemia: Secondary | ICD-10-CM

## 2014-03-09 DIAGNOSIS — Z23 Encounter for immunization: Secondary | ICD-10-CM

## 2014-03-09 DIAGNOSIS — IMO0002 Reserved for concepts with insufficient information to code with codable children: Secondary | ICD-10-CM

## 2014-03-09 DIAGNOSIS — Z0000 Encounter for general adult medical examination without abnormal findings: Secondary | ICD-10-CM

## 2014-03-09 DIAGNOSIS — I10 Essential (primary) hypertension: Secondary | ICD-10-CM

## 2014-03-09 DIAGNOSIS — E785 Hyperlipidemia, unspecified: Secondary | ICD-10-CM

## 2014-03-09 LAB — POCT URINALYSIS DIPSTICK
Bilirubin, UA: NEGATIVE
Blood, UA: NEGATIVE
Glucose, UA: NEGATIVE
Ketones, UA: NEGATIVE
Leukocytes, UA: NEGATIVE
Nitrite, UA: NEGATIVE
Protein, UA: NEGATIVE
Spec Grav, UA: 1.025
Urobilinogen, UA: 0.2
pH, UA: 6

## 2014-03-09 LAB — CBC WITH DIFFERENTIAL/PLATELET
Basophils Absolute: 0.1 10*3/uL (ref 0.0–0.1)
Basophils Relative: 1.2 % (ref 0.0–3.0)
Eosinophils Absolute: 0.1 10*3/uL (ref 0.0–0.7)
Eosinophils Relative: 1.6 % (ref 0.0–5.0)
HCT: 45.5 % (ref 36.0–46.0)
Hemoglobin: 15.4 g/dL — ABNORMAL HIGH (ref 12.0–15.0)
Lymphocytes Relative: 50.4 % — ABNORMAL HIGH (ref 12.0–46.0)
Lymphs Abs: 2.2 10*3/uL (ref 0.7–4.0)
MCHC: 33.9 g/dL (ref 30.0–36.0)
MCV: 90.8 fl (ref 78.0–100.0)
Monocytes Absolute: 0.4 10*3/uL (ref 0.1–1.0)
Monocytes Relative: 8.8 % (ref 3.0–12.0)
Neutro Abs: 1.7 10*3/uL (ref 1.4–7.7)
Neutrophils Relative %: 38 % — ABNORMAL LOW (ref 43.0–77.0)
Platelets: 180 10*3/uL (ref 150.0–400.0)
RBC: 5.01 Mil/uL (ref 3.87–5.11)
RDW: 13.6 % (ref 11.5–15.5)
WBC: 4.4 10*3/uL (ref 4.0–10.5)

## 2014-03-09 LAB — MICROALBUMIN / CREATININE URINE RATIO
Creatinine,U: 163.7 mg/dL
Microalb Creat Ratio: 0.7 mg/g (ref 0.0–30.0)
Microalb, Ur: 1.2 mg/dL (ref 0.0–1.9)

## 2014-03-09 LAB — HEMOGLOBIN A1C: Hgb A1c MFr Bld: 6.3 % (ref 4.6–6.5)

## 2014-03-09 LAB — TSH: TSH: 1.12 u[IU]/mL (ref 0.35–4.50)

## 2014-03-09 MED ORDER — METFORMIN HCL ER 500 MG PO TB24: 500.0000 mg | ORAL_TABLET | Freq: Every day | ORAL | Status: DC

## 2014-03-09 MED ORDER — AMLODIPINE-OLMESARTAN 5-20 MG PO TABS
ORAL_TABLET | ORAL | Status: DC
Start: 2014-03-09 — End: 2014-11-17

## 2014-03-09 NOTE — Progress Notes (Signed)
Subjective:     Angela Garner is a 53 y.o. female and is here for a comprehensive physical exam. The patient reports no new problems --seeing gyn for incontinence.   HPI HYPERTENSION  Blood pressure range-not checking  Chest pain- no      Dyspnea- no Lightheadedness- no   Edema- no Other side effects - no   Medication compliance: good Low salt diet- yes  DIABETES  Blood Sugar ranges-  97-120 Polyuria- no New Visual problems-no Hypoglycemic symptoms- no Other side effects-no Medication compliance - poor Last eye exam- 08/2013 Foot exam- today  HYPERLIPIDEMIA  Medication compliance- good RUQ pain- no  Muscle aches- no Other side effects-no  History   Social History  . Marital Status: Married    Spouse Name: N/A    Number of Children: N/A  . Years of Education: N/A   Occupational History  . Not on file.   Social History Main Topics  . Smoking status: Never Smoker   . Smokeless tobacco: Never Used  . Alcohol Use: No  . Drug Use: No  . Sexual Activity: Yes   Other Topics Concern  . Not on file   Social History Narrative   Health Maintenance  Topic Date Due  . HEMOGLOBIN A1C  01/02/2014  . FOOT EXAM  07/03/2014  . URINE MICROALBUMIN  07/03/2014  . OPHTHALMOLOGY EXAM  09/03/2014  . INFLUENZA VACCINE  11/22/2014  . MAMMOGRAM  02/04/2016  . PAP SMEAR  01/05/2017  . PNEUMOCOCCAL POLYSACCHARIDE VACCINE (2) 01/07/2018  . TETANUS/TDAP  02/03/2018  . COLONOSCOPY  05/12/2023    The following portions of the patient's history were reviewed and updated as appropriate:  She  has a past medical history of Hypertension; Allergy; Diabetes mellitus without complication; Heart murmur; and Hyperlipidemia. She  does not have any pertinent problems on file. She  has past surgical history that includes Uterine fibroid surgery. Her family history includes Clotting disorder in her brother; Dementia in her mother; Diabetes in her father; Heart disease in her father;  Hypertension in her father. There is no history of Colon cancer, Esophageal cancer, Stomach cancer, or Rectal cancer. She  reports that she has never smoked. She has never used smokeless tobacco. She reports that she does not drink alcohol or use illicit drugs. She has a current medication list which includes the following prescription(s): albuterol, amlodipine-olmesartan, beclomethasone, darifenacin, fluticasone-salmeterol, glucose blood, multivitamin-iron-minerals-folic acid, NON FORMULARY, and onetouch delica lancets fine. Current Outpatient Prescriptions on File Prior to Visit  Medication Sig Dispense Refill  . albuterol (PROAIR HFA) 108 (90 BASE) MCG/ACT inhaler Inhale 2 puffs into the lungs every 6 (six) hours as needed for wheezing. 1 Inhaler 2  . amLODipine-olmesartan (AZOR) 5-20 MG per tablet TAKE ONE TABLET BY MOUTH EVERY DAY 30 tablet 5  . beclomethasone (QVAR) 40 MCG/ACT inhaler Inhale 2 puffs into the lungs 2 (two) times daily. 1 Inhaler 12  . darifenacin (ENABLEX) 15 MG 24 hr tablet Take 15 mg by mouth daily.    . Fluticasone-Salmeterol (ADVAIR DISKUS) 250-50 MCG/DOSE AEPB Inhale 1 puff into the lungs 2 (two) times daily. 1 each 3  . glucose blood (ONE TOUCH TEST STRIPS) test strip Test Blood sugar twice a day 100 each 2  . multivitamin-iron-minerals-folic acid (CENTRUM) chewable tablet Chew 1 tablet by mouth daily.    . NON FORMULARY 1 each. Durol-Iron Liver Vitamins    . ONETOUCH DELICA LANCETS FINE MISC 1 each by Does not apply route daily. 100 each 2  No current facility-administered medications on file prior to visit.   She is allergic to penicillins..  Review of Systems Review of Systems  Constitutional: Negative for activity change, appetite change and fatigue.  HENT: Negative for hearing loss, congestion, tinnitus and ear discharge.  dentist q74m Eyes: Negative for visual disturbance (see optho q1y -- vision corrected to 20/20 with glasses).  Respiratory: Negative for  cough, chest tightness and shortness of breath.   Cardiovascular: Negative for chest pain, palpitations and leg swelling.  Gastrointestinal: Negative for abdominal pain, diarrhea, constipation and abdominal distention.  Genitourinary: Negative for urgency, frequency, decreased urine volume and difficulty urinating.  Musculoskeletal: Negative for back pain, arthralgias and gait problem.  Skin: Negative for color change, pallor and rash.  Neurological: Negative for dizziness, light-headedness, numbness and headaches.  Hematological: Negative for adenopathy. Does not bruise/bleed easily.  Psychiatric/Behavioral: Negative for suicidal ideas, confusion, sleep disturbance, self-injury, dysphoric mood, decreased concentration and agitation.       Objective:    BP 122/90 mmHg  Pulse 72  Temp(Src) 98 F (36.7 C) (Oral)  Wt 189 lb (85.73 kg)  SpO2 98% General appearance: alert, cooperative, appears stated age and no distress Head: Normocephalic, without obvious abnormality, atraumatic Eyes: conjunctivae/corneas clear. PERRL, EOM's intact. Fundi benign. Ears: normal TM's and external ear canals both ears Nose: Nares normal. Septum midline. Mucosa normal. No drainage or sinus tenderness. Throat: lips, mucosa, and tongue normal; teeth and gums normal Neck: no adenopathy, no carotid bruit, no JVD, supple, symmetrical, trachea midline and thyroid not enlarged, symmetric, no tenderness/mass/nodules Back: symmetric, no curvature. ROM normal. No CVA tenderness. Lungs: clear to auscultation bilaterally Breasts: gyn Heart: regular rate and rhythm, S1, S2 normal, no murmur, click, rub or gallop Abdomen: soft, non-tender; bowel sounds normal; no masses,  no organomegaly Pelvic: deferred--gyn Extremities: extremities normal, atraumatic, no cyanosis or edema Pulses: 2+ and symmetric Skin: Skin color, texture, turgor normal. No rashes or lesions Lymph nodes: Cervical, supraclavicular, and axillary nodes  normal. Neurologic: Alert and oriented X 3, normal strength and tone. Normal symmetric reflexes. Normal coordination and gait Psych-- no depression, no anxiety   Assessment:    Healthy female exam.      Plan:     ghm utd See After Visit Summary for Counseling Recommendations   Check labs  1. Need for prophylactic vaccination and inoculation against influenza   - Flu Vaccine QUAD 36+ mos PF IM (Fluarix Quad PF)  2. Diabetes mellitus type II, uncontrolled Check labs ,  - Basic metabolic panel - Hemoglobin A1c - POCT urinalysis dipstick - Microalbumin / creatinine urine ratio - TSH - metFORMIN (GLUCOPHAGE-XR) 500 MG 24 hr tablet; Take 1 tablet (500 mg total) by mouth at bedtime.  Dispense: 30 tablet; Refill: 5  3. Hyperlipidemia Check labs== pt off all meds - Basic metabolic panel - Hemoglobin A1c - Hepatic function panel - Lipid panel - POCT urinalysis dipstick - Microalbumin / creatinine urine ratio - TSH  4. Preventative health care   - Basic metabolic panel - CBC with Differential - Hemoglobin A1c - Hepatic function panel - Lipid panel - POCT urinalysis dipstick - Microalbumin / creatinine urine ratio - TSH  5. Essential hypertension Stable, con't meds - amLODipine-olmesartan (AZOR) 5-20 MG per tablet; TAKE ONE TABLET BY MOUTH EVERY DAY  Dispense: 30 tablet; Refill: 5

## 2014-03-09 NOTE — Progress Notes (Signed)
Pre visit review using our clinic review tool, if applicable. No additional management support is needed unless otherwise documented below in the visit note. 

## 2014-03-09 NOTE — Patient Instructions (Signed)
Preventive Care for Adults A healthy lifestyle and preventive care can promote health and wellness. Preventive health guidelines for women include the following key practices.  A routine yearly physical is a good way to check with your health care provider about your health and preventive screening. It is a chance to share any concerns and updates on your health and to receive a thorough exam.  Visit your dentist for a routine exam and preventive care every 6 months. Brush your teeth twice a day and floss once a day. Good oral hygiene prevents tooth decay and gum disease.  The frequency of eye exams is based on your age, health, family medical history, use of contact lenses, and other factors. Follow your health care provider's recommendations for frequency of eye exams.  Eat a healthy diet. Foods like vegetables, fruits, whole grains, low-fat dairy products, and lean protein foods contain the nutrients you need without too many calories. Decrease your intake of foods high in solid fats, added sugars, and salt. Eat the right amount of calories for you.Get information about a proper diet from your health care provider, if necessary.  Regular physical exercise is one of the most important things you can do for your health. Most adults should get at least 150 minutes of moderate-intensity exercise (any activity that increases your heart rate and causes you to sweat) each week. In addition, most adults need muscle-strengthening exercises on 2 or more days a week.  Maintain a healthy weight. The body mass index (BMI) is a screening tool to identify possible weight problems. It provides an estimate of body fat based on height and weight. Your health care provider can find your BMI and can help you achieve or maintain a healthy weight.For adults 20 years and older:  A BMI below 18.5 is considered underweight.  A BMI of 18.5 to 24.9 is normal.  A BMI of 25 to 29.9 is considered overweight.  A BMI of  30 and above is considered obese.  Maintain normal blood lipids and cholesterol levels by exercising and minimizing your intake of saturated fat. Eat a balanced diet with plenty of fruit and vegetables. Blood tests for lipids and cholesterol should begin at age 76 and be repeated every 5 years. If your lipid or cholesterol levels are high, you are over 50, or you are at high risk for heart disease, you may need your cholesterol levels checked more frequently.Ongoing high lipid and cholesterol levels should be treated with medicines if diet and exercise are not working.  If you smoke, find out from your health care provider how to quit. If you do not use tobacco, do not start.  Lung cancer screening is recommended for adults aged 22-80 years who are at high risk for developing lung cancer because of a history of smoking. A yearly low-dose CT scan of the lungs is recommended for people who have at least a 30-pack-year history of smoking and are a current smoker or have quit within the past 15 years. A pack year of smoking is smoking an average of 1 pack of cigarettes a day for 1 year (for example: 1 pack a day for 30 years or 2 packs a day for 15 years). Yearly screening should continue until the smoker has stopped smoking for at least 15 years. Yearly screening should be stopped for people who develop a health problem that would prevent them from having lung cancer treatment.  If you are pregnant, do not drink alcohol. If you are breastfeeding,  be very cautious about drinking alcohol. If you are not pregnant and choose to drink alcohol, do not have more than 1 drink per day. One drink is considered to be 12 ounces (355 mL) of beer, 5 ounces (148 mL) of wine, or 1.5 ounces (44 mL) of liquor.  Avoid use of street drugs. Do not share needles with anyone. Ask for help if you need support or instructions about stopping the use of drugs.  High blood pressure causes heart disease and increases the risk of  stroke. Your blood pressure should be checked at least every 1 to 2 years. Ongoing high blood pressure should be treated with medicines if weight loss and exercise do not work.  If you are 3-86 years old, ask your health care provider if you should take aspirin to prevent strokes.  Diabetes screening involves taking a blood sample to check your fasting blood sugar level. This should be done once every 3 years, after age 67, if you are within normal weight and without risk factors for diabetes. Testing should be considered at a younger age or be carried out more frequently if you are overweight and have at least 1 risk factor for diabetes.  Breast cancer screening is essential preventive care for women. You should practice "breast self-awareness." This means understanding the normal appearance and feel of your breasts and may include breast self-examination. Any changes detected, no matter how small, should be reported to a health care provider. Women in their 8s and 30s should have a clinical breast exam (CBE) by a health care provider as part of a regular health exam every 1 to 3 years. After age 70, women should have a CBE every year. Starting at age 25, women should consider having a mammogram (breast X-ray test) every year. Women who have a family history of breast cancer should talk to their health care provider about genetic screening. Women at a high risk of breast cancer should talk to their health care providers about having an MRI and a mammogram every year.  Breast cancer gene (BRCA)-related cancer risk assessment is recommended for women who have family members with BRCA-related cancers. BRCA-related cancers include breast, ovarian, tubal, and peritoneal cancers. Having family members with these cancers may be associated with an increased risk for harmful changes (mutations) in the breast cancer genes BRCA1 and BRCA2. Results of the assessment will determine the need for genetic counseling and  BRCA1 and BRCA2 testing.  Routine pelvic exams to screen for cancer are no longer recommended for nonpregnant women who are considered low risk for cancer of the pelvic organs (ovaries, uterus, and vagina) and who do not have symptoms. Ask your health care provider if a screening pelvic exam is right for you.  If you have had past treatment for cervical cancer or a condition that could lead to cancer, you need Pap tests and screening for cancer for at least 20 years after your treatment. If Pap tests have been discontinued, your risk factors (such as having a new sexual partner) need to be reassessed to determine if screening should be resumed. Some women have medical problems that increase the chance of getting cervical cancer. In these cases, your health care provider may recommend more frequent screening and Pap tests.  The HPV test is an additional test that may be used for cervical cancer screening. The HPV test looks for the virus that can cause the cell changes on the cervix. The cells collected during the Pap test can be  tested for HPV. The HPV test could be used to screen women aged 30 years and older, and should be used in women of any age who have unclear Pap test results. After the age of 30, women should have HPV testing at the same frequency as a Pap test.  Colorectal cancer can be detected and often prevented. Most routine colorectal cancer screening begins at the age of 50 years and continues through age 75 years. However, your health care provider may recommend screening at an earlier age if you have risk factors for colon cancer. On a yearly basis, your health care provider may provide home test kits to check for hidden blood in the stool. Use of a small camera at the end of a tube, to directly examine the colon (sigmoidoscopy or colonoscopy), can detect the earliest forms of colorectal cancer. Talk to your health care provider about this at age 50, when routine screening begins. Direct  exam of the colon should be repeated every 5-10 years through age 75 years, unless early forms of pre-cancerous polyps or small growths are found.  People who are at an increased risk for hepatitis B should be screened for this virus. You are considered at high risk for hepatitis B if:  You were born in a country where hepatitis B occurs often. Talk with your health care provider about which countries are considered high risk.  Your parents were born in a high-risk country and you have not received a shot to protect against hepatitis B (hepatitis B vaccine).  You have HIV or AIDS.  You use needles to inject street drugs.  You live with, or have sex with, someone who has hepatitis B.  You get hemodialysis treatment.  You take certain medicines for conditions like cancer, organ transplantation, and autoimmune conditions.  Hepatitis C blood testing is recommended for all people born from 1945 through 1965 and any individual with known risks for hepatitis C.  Practice safe sex. Use condoms and avoid high-risk sexual practices to reduce the spread of sexually transmitted infections (STIs). STIs include gonorrhea, chlamydia, syphilis, trichomonas, herpes, HPV, and human immunodeficiency virus (HIV). Herpes, HIV, and HPV are viral illnesses that have no cure. They can result in disability, cancer, and death.  You should be screened for sexually transmitted illnesses (STIs) including gonorrhea and chlamydia if:  You are sexually active and are younger than 24 years.  You are older than 24 years and your health care provider tells you that you are at risk for this type of infection.  Your sexual activity has changed since you were last screened and you are at an increased risk for chlamydia or gonorrhea. Ask your health care provider if you are at risk.  If you are at risk of being infected with HIV, it is recommended that you take a prescription medicine daily to prevent HIV infection. This is  called preexposure prophylaxis (PrEP). You are considered at risk if:  You are a heterosexual woman, are sexually active, and are at increased risk for HIV infection.  You take drugs by injection.  You are sexually active with a partner who has HIV.  Talk with your health care provider about whether you are at high risk of being infected with HIV. If you choose to begin PrEP, you should first be tested for HIV. You should then be tested every 3 months for as long as you are taking PrEP.  Osteoporosis is a disease in which the bones lose minerals and strength   with aging. This can result in serious bone fractures or breaks. The risk of osteoporosis can be identified using a bone density scan. Women ages 65 years and over and women at risk for fractures or osteoporosis should discuss screening with their health care providers. Ask your health care provider whether you should take a calcium supplement or vitamin D to reduce the rate of osteoporosis.  Menopause can be associated with physical symptoms and risks. Hormone replacement therapy is available to decrease symptoms and risks. You should talk to your health care provider about whether hormone replacement therapy is right for you.  Use sunscreen. Apply sunscreen liberally and repeatedly throughout the day. You should seek shade when your shadow is shorter than you. Protect yourself by wearing long sleeves, pants, a wide-brimmed hat, and sunglasses year round, whenever you are outdoors.  Once a month, do a whole body skin exam, using a mirror to look at the skin on your back. Tell your health care provider of new moles, moles that have irregular borders, moles that are larger than a pencil eraser, or moles that have changed in shape or color.  Stay current with required vaccines (immunizations).  Influenza vaccine. All adults should be immunized every year.  Tetanus, diphtheria, and acellular pertussis (Td, Tdap) vaccine. Pregnant women should  receive 1 dose of Tdap vaccine during each pregnancy. The dose should be obtained regardless of the length of time since the last dose. Immunization is preferred during the 27th-36th week of gestation. An adult who has not previously received Tdap or who does not know her vaccine status should receive 1 dose of Tdap. This initial dose should be followed by tetanus and diphtheria toxoids (Td) booster doses every 10 years. Adults with an unknown or incomplete history of completing a 3-dose immunization series with Td-containing vaccines should begin or complete a primary immunization series including a Tdap dose. Adults should receive a Td booster every 10 years.  Varicella vaccine. An adult without evidence of immunity to varicella should receive 2 doses or a second dose if she has previously received 1 dose. Pregnant females who do not have evidence of immunity should receive the first dose after pregnancy. This first dose should be obtained before leaving the health care facility. The second dose should be obtained 4-8 weeks after the first dose.  Human papillomavirus (HPV) vaccine. Females aged 13-26 years who have not received the vaccine previously should obtain the 3-dose series. The vaccine is not recommended for use in pregnant females. However, pregnancy testing is not needed before receiving a dose. If a female is found to be pregnant after receiving a dose, no treatment is needed. In that case, the remaining doses should be delayed until after the pregnancy. Immunization is recommended for any person with an immunocompromised condition through the age of 26 years if she did not get any or all doses earlier. During the 3-dose series, the second dose should be obtained 4-8 weeks after the first dose. The third dose should be obtained 24 weeks after the first dose and 16 weeks after the second dose.  Zoster vaccine. One dose is recommended for adults aged 60 years or older unless certain conditions are  present.  Measles, mumps, and rubella (MMR) vaccine. Adults born before 1957 generally are considered immune to measles and mumps. Adults born in 1957 or later should have 1 or more doses of MMR vaccine unless there is a contraindication to the vaccine or there is laboratory evidence of immunity to   each of the three diseases. A routine second dose of MMR vaccine should be obtained at least 28 days after the first dose for students attending postsecondary schools, health care workers, or international travelers. People who received inactivated measles vaccine or an unknown type of measles vaccine during 1963-1967 should receive 2 doses of MMR vaccine. People who received inactivated mumps vaccine or an unknown type of mumps vaccine before 1979 and are at high risk for mumps infection should consider immunization with 2 doses of MMR vaccine. For females of childbearing age, rubella immunity should be determined. If there is no evidence of immunity, females who are not pregnant should be vaccinated. If there is no evidence of immunity, females who are pregnant should delay immunization until after pregnancy. Unvaccinated health care workers born before 1957 who lack laboratory evidence of measles, mumps, or rubella immunity or laboratory confirmation of disease should consider measles and mumps immunization with 2 doses of MMR vaccine or rubella immunization with 1 dose of MMR vaccine.  Pneumococcal 13-valent conjugate (PCV13) vaccine. When indicated, a person who is uncertain of her immunization history and has no record of immunization should receive the PCV13 vaccine. An adult aged 19 years or older who has certain medical conditions and has not been previously immunized should receive 1 dose of PCV13 vaccine. This PCV13 should be followed with a dose of pneumococcal polysaccharide (PPSV23) vaccine. The PPSV23 vaccine dose should be obtained at least 8 weeks after the dose of PCV13 vaccine. An adult aged 19  years or older who has certain medical conditions and previously received 1 or more doses of PPSV23 vaccine should receive 1 dose of PCV13. The PCV13 vaccine dose should be obtained 1 or more years after the last PPSV23 vaccine dose.  Pneumococcal polysaccharide (PPSV23) vaccine. When PCV13 is also indicated, PCV13 should be obtained first. All adults aged 65 years and older should be immunized. An adult younger than age 65 years who has certain medical conditions should be immunized. Any person who resides in a nursing home or long-term care facility should be immunized. An adult smoker should be immunized. People with an immunocompromised condition and certain other conditions should receive both PCV13 and PPSV23 vaccines. People with human immunodeficiency virus (HIV) infection should be immunized as soon as possible after diagnosis. Immunization during chemotherapy or radiation therapy should be avoided. Routine use of PPSV23 vaccine is not recommended for American Indians, [place] Natives, or people younger than 65 years unless there are medical conditions that require PPSV23 vaccine. When indicated, people who have unknown immunization and have no record of immunization should receive PPSV23 vaccine. One-time revaccination 5 years after the first dose of PPSV23 is recommended for people aged 19-64 years who have chronic kidney failure, nephrotic syndrome, asplenia, or immunocompromised conditions. People who received 1-2 doses of PPSV23 before age 65 years should receive another dose of PPSV23 vaccine at age 65 years or later if at least 5 years have passed since the previous dose. Doses of PPSV23 are not needed for people immunized with PPSV23 at or after age 65 years.  Meningococcal vaccine. Adults with asplenia or persistent complement component deficiencies should receive 2 doses of quadrivalent meningococcal conjugate (MenACWY-D) vaccine. The doses should be obtained at least 2 months apart.  Microbiologists working with certain meningococcal bacteria, military recruits, people at risk during an outbreak, and people who travel to or live in countries with a high rate of meningitis should be immunized. A first-year college student up through age   21 years who is living in a residence hall should receive a dose if she did not receive a dose on or after her 16th birthday. Adults who have certain high-risk conditions should receive one or more doses of vaccine.  Hepatitis A vaccine. Adults who wish to be protected from this disease, have certain high-risk conditions, work with hepatitis A-infected animals, work in hepatitis A research labs, or travel to or work in countries with a high rate of hepatitis A should be immunized. Adults who were previously unvaccinated and who anticipate close contact with an international adoptee during the first 60 days after arrival in the [place] from a country with a high rate of hepatitis A should be immunized.  Hepatitis B vaccine. Adults who wish to be protected from this disease, have certain high-risk conditions, may be exposed to blood or other infectious body fluids, are household contacts or sex partners of hepatitis B positive people, are clients or workers in certain care facilities, or travel to or work in countries with a high rate of hepatitis B should be immunized.  Haemophilus influenzae type b (Hib) vaccine. A previously unvaccinated person with asplenia or sickle cell disease or having a scheduled splenectomy should receive 1 dose of Hib vaccine. Regardless of previous immunization, a recipient of a hematopoietic stem cell transplant should receive a 3-dose series 6-12 months after her successful transplant. Hib vaccine is not recommended for adults with HIV infection. Preventive Services / Frequency Ages 64 to 68 years  Blood pressure check.** / Every 1 to 2 years.  Lipid and cholesterol check.** / Every 5 years beginning at age  22.  Clinical breast exam.** / Every 3 years for women in their 88s and 53s.  BRCA-related cancer risk assessment.** / For women who have family members with a BRCA-related cancer (breast, ovarian, tubal, or peritoneal cancers).  Pap test.** / Every 2 years from ages 90 through 51. Every 3 years starting at age 21 through age 56 or 3 with a history of 3 consecutive normal Pap tests.  HPV screening.** / Every 3 years from ages 24 through ages 1 to 46 with a history of 3 consecutive normal Pap tests.  Hepatitis C blood test.** / For any individual with known risks for hepatitis C.  Skin self-exam. / Monthly.  Influenza vaccine. / Every year.  Tetanus, diphtheria, and acellular pertussis (Tdap, Td) vaccine.** / Consult your health care provider. Pregnant women should receive 1 dose of Tdap vaccine during each pregnancy. 1 dose of Td every 10 years.  Varicella vaccine.** / Consult your health care provider. Pregnant females who do not have evidence of immunity should receive the first dose after pregnancy.  HPV vaccine. / 3 doses over 6 months, if 72 and younger. The vaccine is not recommended for use in pregnant females. However, pregnancy testing is not needed before receiving a dose.  Measles, mumps, rubella (MMR) vaccine.** / You need at least 1 dose of MMR if you were born in 1957 or later. You may also need a 2nd dose. For females of childbearing age, rubella immunity should be determined. If there is no evidence of immunity, females who are not pregnant should be vaccinated. If there is no evidence of immunity, females who are pregnant should delay immunization until after pregnancy.  Pneumococcal 13-valent conjugate (PCV13) vaccine.** / Consult your health care provider.  Pneumococcal polysaccharide (PPSV23) vaccine.** / 1 to 2 doses if you smoke cigarettes or if you have certain conditions.  Meningococcal vaccine.** /  1 dose if you are age 19 to 21 years and a first-year college  student living in a residence hall, or have one of several medical conditions, you need to get vaccinated against meningococcal disease. You may also need additional booster doses.  Hepatitis A vaccine.** / Consult your health care provider.  Hepatitis B vaccine.** / Consult your health care provider.  Haemophilus influenzae type b (Hib) vaccine.** / Consult your health care provider. Ages 40 to 64 years  Blood pressure check.** / Every 1 to 2 years.  Lipid and cholesterol check.** / Every 5 years beginning at age 20 years.  Lung cancer screening. / Every year if you are aged 55-80 years and have a 30-pack-year history of smoking and currently smoke or have quit within the past 15 years. Yearly screening is stopped once you have quit smoking for at least 15 years or develop a health problem that would prevent you from having lung cancer treatment.  Clinical breast exam.** / Every year after age 40 years.  BRCA-related cancer risk assessment.** / For women who have family members with a BRCA-related cancer (breast, ovarian, tubal, or peritoneal cancers).  Mammogram.** / Every year beginning at age 40 years and continuing for as long as you are in good health. Consult with your health care provider.  Pap test.** / Every 3 years starting at age 30 years through age 65 or 70 years with a history of 3 consecutive normal Pap tests.  HPV screening.** / Every 3 years from ages 30 years through ages 65 to 70 years with a history of 3 consecutive normal Pap tests.  Fecal occult blood test (FOBT) of stool. / Every year beginning at age 50 years and continuing until age 75 years. You may not need to do this test if you get a colonoscopy every 10 years.  Flexible sigmoidoscopy or colonoscopy.** / Every 5 years for a flexible sigmoidoscopy or every 10 years for a colonoscopy beginning at age 50 years and continuing until age 75 years.  Hepatitis C blood test.** / For all people born from 1945 through  1965 and any individual with known risks for hepatitis C.  Skin self-exam. / Monthly.  Influenza vaccine. / Every year.  Tetanus, diphtheria, and acellular pertussis (Tdap/Td) vaccine.** / Consult your health care provider. Pregnant women should receive 1 dose of Tdap vaccine during each pregnancy. 1 dose of Td every 10 years.  Varicella vaccine.** / Consult your health care provider. Pregnant females who do not have evidence of immunity should receive the first dose after pregnancy.  Zoster vaccine.** / 1 dose for adults aged 60 years or older.  Measles, mumps, rubella (MMR) vaccine.** / You need at least 1 dose of MMR if you were born in 1957 or later. You may also need a 2nd dose. For females of childbearing age, rubella immunity should be determined. If there is no evidence of immunity, females who are not pregnant should be vaccinated. If there is no evidence of immunity, females who are pregnant should delay immunization until after pregnancy.  Pneumococcal 13-valent conjugate (PCV13) vaccine.** / Consult your health care provider.  Pneumococcal polysaccharide (PPSV23) vaccine.** / 1 to 2 doses if you smoke cigarettes or if you have certain conditions.  Meningococcal vaccine.** / Consult your health care provider.  Hepatitis A vaccine.** / Consult your health care provider.  Hepatitis B vaccine.** / Consult your health care provider.  Haemophilus influenzae type b (Hib) vaccine.** / Consult your health care provider. Ages 65   years and over  Blood pressure check.** / Every 1 to 2 years.  Lipid and cholesterol check.** / Every 5 years beginning at age 22 years.  Lung cancer screening. / Every year if you are aged 73-80 years and have a 30-pack-year history of smoking and currently smoke or have quit within the past 15 years. Yearly screening is stopped once you have quit smoking for at least 15 years or develop a health problem that would prevent you from having lung cancer  treatment.  Clinical breast exam.** / Every year after age 4 years.  BRCA-related cancer risk assessment.** / For women who have family members with a BRCA-related cancer (breast, ovarian, tubal, or peritoneal cancers).  Mammogram.** / Every year beginning at age 40 years and continuing for as long as you are in good health. Consult with your health care provider.  Pap test.** / Every 3 years starting at age 9 years through age 34 or 91 years with 3 consecutive normal Pap tests. Testing can be stopped between 65 and 70 years with 3 consecutive normal Pap tests and no abnormal Pap or HPV tests in the past 10 years.  HPV screening.** / Every 3 years from ages 57 years through ages 64 or 45 years with a history of 3 consecutive normal Pap tests. Testing can be stopped between 65 and 70 years with 3 consecutive normal Pap tests and no abnormal Pap or HPV tests in the past 10 years.  Fecal occult blood test (FOBT) of stool. / Every year beginning at age 15 years and continuing until age 17 years. You may not need to do this test if you get a colonoscopy every 10 years.  Flexible sigmoidoscopy or colonoscopy.** / Every 5 years for a flexible sigmoidoscopy or every 10 years for a colonoscopy beginning at age 86 years and continuing until age 71 years.  Hepatitis C blood test.** / For all people born from [birth year] through 1966 and any individual with known risks for hepatitis C.  Osteoporosis screening.** / A one-time screening for women ages 83 years and over and women at risk for fractures or osteoporosis.  Skin self-exam. / Monthly.  Influenza vaccine. / Every year.  Tetanus, diphtheria, and acellular pertussis (Tdap/Td) vaccine.** / 1 dose of Td every 10 years.  Varicella vaccine.** / Consult your health care provider.  Zoster vaccine.** / 1 dose for adults aged 61 years or older.  Pneumococcal 13-valent conjugate (PCV13) vaccine.** / Consult your health care provider.  Pneumococcal  polysaccharide (PPSV23) vaccine.** / 1 dose for all adults aged 28 years and older.  Meningococcal vaccine.** / Consult your health care provider.  Hepatitis A vaccine.** / Consult your health care provider.  Hepatitis B vaccine.** / Consult your health care provider.  Haemophilus influenzae type b (Hib) vaccine.** / Consult your health care provider. ** Family history and personal history of risk and conditions may change your health care provider's recommendations. Document Released: 06/05/2001 Document Revised: 08/24/2013 Document Reviewed: 09/04/2010 [facility] Patient Information 2015 [place], [place]. This information is not intended to replace advice given to you by your health care provider. Make sure you discuss any questions you have with your health care provider.

## 2014-03-10 LAB — HEPATIC FUNCTION PANEL
ALT: 30 U/L (ref 0–35)
AST: 35 U/L (ref 0–37)
Albumin: 4.7 g/dL (ref 3.5–5.2)
Alkaline Phosphatase: 73 U/L (ref 39–117)
Bilirubin, Direct: 0.1 mg/dL (ref 0.0–0.3)
Total Bilirubin: 0.7 mg/dL (ref 0.2–1.2)
Total Protein: 7.5 g/dL (ref 6.0–8.3)

## 2014-03-10 LAB — BASIC METABOLIC PANEL
BUN: 27 mg/dL — ABNORMAL HIGH (ref 6–23)
CO2: 22 mEq/L (ref 19–32)
Calcium: 10 mg/dL (ref 8.4–10.5)
Chloride: 115 mEq/L — ABNORMAL HIGH (ref 96–112)
Creatinine, Ser: 1.1 mg/dL (ref 0.4–1.2)
GFR: 70.12 mL/min (ref >60.00)
Glucose, Bld: 114 mg/dL — ABNORMAL HIGH (ref 70–99)
Potassium: 4.2 mEq/L (ref 3.5–5.1)
Sodium: 153 mEq/L — ABNORMAL HIGH (ref 135–145)

## 2014-03-10 LAB — LIPID PANEL
Cholesterol: 204 mg/dL — ABNORMAL HIGH (ref 0–200)
HDL: 83.6 mg/dL (ref >39.00)
LDL Cholesterol: 114 mg/dL — ABNORMAL HIGH (ref 0–99)
NonHDL: 120.4
Total CHOL/HDL Ratio: 2
Triglycerides: 33 mg/dL (ref 0.0–149.0)
VLDL: 6.6 mg/dL (ref 0.0–40.0)

## 2014-03-12 ENCOUNTER — Telehealth: Payer: Self-pay

## 2014-03-12 DIAGNOSIS — E870 Hyperosmolality and hypernatremia: Secondary | ICD-10-CM

## 2014-03-12 NOTE — Telephone Encounter (Signed)
Angela Garner [phone]  Returned call

## 2014-03-12 NOTE — Telephone Encounter (Signed)
Repeat bmp in next week or two--- sodium high Dm controlled--- con't meds Cholesterol--- LDL goal < 70, HDL >40, TG < 150. Diet and exercise will increase HDL and decrease LDL and TG. Fish, Fish Oil, Flaxseed oil will also help increase the HDL and decrease Triglycerides.  Recheck labs in 3 months---- start pravastatin 20 mg #30 1 every night, 2 refills  Recheck 3 months----lipid hep, hgba1c, bmp-----DM II controlled, hyperlipidemia.

## 2014-03-12 NOTE — Telephone Encounter (Signed)
Pt returning your call pt states leave detail message.

## 2014-03-17 MED ORDER — PRAVASTATIN SODIUM 20 MG PO TABS: 20.0000 mg | ORAL_TABLET | Freq: Every day | ORAL | Status: DC

## 2014-03-17 NOTE — Addendum Note (Signed)
Addended by: Ewing Schlein on: 03/17/2014 11:06 AM   Modules accepted: Orders

## 2014-03-17 NOTE — Telephone Encounter (Signed)
Detailed message has been left on VM and the medication has been sent and labs mailed.     KP

## 2014-03-25 ENCOUNTER — Other Ambulatory Visit (INDEPENDENT_AMBULATORY_CARE_PROVIDER_SITE_OTHER): Payer: BC Managed Care – PPO

## 2014-03-25 DIAGNOSIS — E870 Hyperosmolality and hypernatremia: Secondary | ICD-10-CM

## 2014-03-25 LAB — BASIC METABOLIC PANEL
BUN: 16 mg/dL (ref 6–23)
CO2: 28 mEq/L (ref 19–32)
Calcium: 9 mg/dL (ref 8.4–10.5)
Chloride: 104 mEq/L (ref 96–112)
Creatinine, Ser: 1 mg/dL (ref 0.4–1.2)
GFR: 78.69 mL/min (ref >60.00)
Glucose, Bld: 101 mg/dL — ABNORMAL HIGH (ref 70–99)
Potassium: 3.3 mEq/L — ABNORMAL LOW (ref 3.5–5.1)
Sodium: 141 mEq/L (ref 135–145)

## 2014-05-27 ENCOUNTER — Ambulatory Visit (INDEPENDENT_AMBULATORY_CARE_PROVIDER_SITE_OTHER): Payer: BLUE CROSS/BLUE SHIELD | Admitting: Family Medicine

## 2014-05-27 ENCOUNTER — Encounter: Payer: Self-pay | Admitting: Family Medicine

## 2014-05-27 ENCOUNTER — Telehealth: Payer: Self-pay | Admitting: Family Medicine

## 2014-05-27 VITALS — BP 131/88 | HR 68 | Temp 98.3°F | Wt 192.4 lb

## 2014-05-27 DIAGNOSIS — S39012A Strain of muscle, fascia and tendon of lower back, initial encounter: Secondary | ICD-10-CM

## 2014-05-27 DIAGNOSIS — IMO0002 Reserved for concepts with insufficient information to code with codable children: Secondary | ICD-10-CM

## 2014-05-27 DIAGNOSIS — I10 Essential (primary) hypertension: Secondary | ICD-10-CM

## 2014-05-27 DIAGNOSIS — E1165 Type 2 diabetes mellitus with hyperglycemia: Secondary | ICD-10-CM

## 2014-05-27 DIAGNOSIS — E785 Hyperlipidemia, unspecified: Secondary | ICD-10-CM

## 2014-05-27 LAB — BASIC METABOLIC PANEL
BUN: 26 mg/dL — ABNORMAL HIGH (ref 6–23)
CO2: 31 mEq/L (ref 19–32)
Calcium: 9.9 mg/dL (ref 8.4–10.5)
Chloride: 101 mEq/L (ref 96–112)
Creatinine, Ser: 0.9 mg/dL (ref 0.40–1.20)
GFR: 83.71 mL/min (ref >60.00)
Glucose, Bld: 90 mg/dL (ref 70–99)
Potassium: 3.8 mEq/L (ref 3.5–5.1)
Sodium: 138 mEq/L (ref 135–145)

## 2014-05-27 LAB — POCT URINALYSIS DIPSTICK
Bilirubin, UA: NEGATIVE
Blood, UA: NEGATIVE
Glucose, UA: NEGATIVE
Ketones, UA: NEGATIVE
Leukocytes, UA: NEGATIVE
Nitrite, UA: NEGATIVE
Protein, UA: NEGATIVE
Spec Grav, UA: >=1.03
Urobilinogen, UA: NEGATIVE
pH, UA: 6

## 2014-05-27 LAB — HEMOGLOBIN A1C: Hgb A1c MFr Bld: 6.4 % (ref 4.6–6.5)

## 2014-05-27 LAB — LIPID PANEL
Cholesterol: 154 mg/dL (ref 0–200)
HDL: 72 mg/dL (ref >39.00)
LDL Cholesterol: 69 mg/dL (ref 0–99)
NonHDL: 82
Total CHOL/HDL Ratio: 2
Triglycerides: 67 mg/dL (ref 0.0–149.0)
VLDL: 13.4 mg/dL (ref 0.0–40.0)

## 2014-05-27 LAB — HEPATIC FUNCTION PANEL
ALT: 22 U/L (ref 0–35)
AST: 23 U/L (ref 0–37)
Albumin: 4.5 g/dL (ref 3.5–5.2)
Alkaline Phosphatase: 82 U/L (ref 39–117)
Bilirubin, Direct: 0.2 mg/dL (ref 0.0–0.3)
Total Bilirubin: 0.7 mg/dL (ref 0.2–1.2)
Total Protein: 7.2 g/dL (ref 6.0–8.3)

## 2014-05-27 MED ORDER — CYCLOBENZAPRINE HCL 10 MG PO TABS: 10.0000 mg | ORAL_TABLET | Freq: Three times a day (TID) | ORAL | Status: DC | PRN

## 2014-05-27 MED ORDER — TRAMADOL HCL 50 MG PO TABS: 50.0000 mg | ORAL_TABLET | Freq: Four times a day (QID) | ORAL | Status: DC | PRN

## 2014-05-27 NOTE — Progress Notes (Signed)
  Subjective:    Angela Garner is a 54 y.o. female who presents for evaluation of low back pain. The patient has had no prior back problems. Symptoms have been present for 1 week and are gradually worsening.  Onset was related to / precipitated by no known injury. The pain is located in the across the lower back and radiates to the right thigh. The pain is described as aching and occurs all day. She rates her pain as moderate. Symptoms are exacerbated by sitting and standing. Symptoms are improved by nothing. She has also tried nothing which provided no symptom relief. She has no other symptoms associated with the back pain. The patient has no "red flag" history indicative of complicated back pain.  The following portions of the patient's history were reviewed and updated as appropriate: allergies, current medications, past family history, past medical history, past social history, past surgical history and problem list.  Review of Systems Pertinent items are noted in HPI.    Objective:   Inspection and palpation: inspection of back is normal. Muscle tone and ROM exam: muscle spasm noted across low back. Straight leg raise: negative at 90 degrees bilaterally. Neurological: normal DTRs, muscle strength and reflexes.    Assessment:    Nonspecific acute low back pain    Plan:    Natural history and expected course discussed. Questions answered. Neurosurgeon distributed. Proper lifting, bending technique discussed. Stretching exercises discussed. Regular aerobic and trunk strengthening exercises discussed. Short (2-4 day) period of relative rest recommended until acute symptoms improve. Ice to affected area as needed for local pain relief. Heat to affected area as needed for local pain relief. Muscle relaxants per medication orders. Follow-up in 2 weeks.

## 2014-05-27 NOTE — Telephone Encounter (Signed)
Work note printed and left at check in.      KP

## 2014-05-27 NOTE — Patient Instructions (Signed)
Back Pain, Adult Low back pain is very common. About 1 in 5 people have back pain.The cause of low back pain is rarely dangerous. The pain often gets better over time.About half of people with a sudden onset of back pain feel better in just 2 weeks. About 8 in 10 people feel better by 6 weeks.  CAUSES Some common causes of back pain include:  Strain of the muscles or ligaments supporting the spine.  Wear and tear (degeneration) of the spinal discs.  Arthritis.  Direct injury to the back. DIAGNOSIS Most of the time, the direct cause of low back pain is not known.However, back pain can be treated effectively even when the exact cause of the pain is unknown.Answering your caregiver's questions about your overall health and symptoms is one of the most accurate ways to make sure the cause of your pain is not dangerous. If your caregiver needs more information, he or she may order lab work or imaging tests (X-rays or MRIs).However, even if imaging tests show changes in your back, this usually does not require surgery. HOME CARE INSTRUCTIONS For many people, back pain returns.Since low back pain is rarely dangerous, it is often a condition that people can learn to manageon their own.   Remain active. It is stressful on the back to sit or stand in one place. Do not sit, drive, or stand in one place for more than 30 minutes at a time. Take short walks on level surfaces as soon as pain allows.Try to increase the length of time you walk each day.  Do not stay in bed.Resting more than 1 or 2 days can delay your recovery.  Do not avoid exercise or work.Your body is made to move.It is not dangerous to be active, even though your back may hurt.Your back will likely heal faster if you return to being active before your pain is gone.  Pay attention to your body when you bend and lift. Many people have less discomfortwhen lifting if they bend their knees, keep the load close to their bodies,and  avoid twisting. Often, the most comfortable positions are those that put less stress on your recovering back.  Find a comfortable position to sleep. Use a firm mattress and lie on your side with your knees slightly bent. If you lie on your back, put a pillow under your knees.  Only take over-the-counter or prescription medicines as directed by your caregiver. Over-the-counter medicines to reduce pain and inflammation are often the most helpful.Your caregiver may prescribe muscle relaxant drugs.These medicines help dull your pain so you can more quickly return to your normal activities and healthy exercise.  Put ice on the injured area.  Put ice in a plastic bag.  Place a towel between your skin and the bag.  Leave the ice on for 15-20 minutes, 03-04 times a day for the first 2 to 3 days. After that, ice and heat may be alternated to reduce pain and spasms.  Ask your caregiver about trying back exercises and gentle massage. This may be of some benefit.  Avoid feeling anxious or stressed.Stress increases muscle tension and can worsen back pain.It is important to recognize when you are anxious or stressed and learn ways to manage it.Exercise is a great option. SEEK MEDICAL CARE IF:  You have pain that is not relieved with rest or medicine.  You have pain that does not improve in 1 week.  You have new symptoms.  You are generally not feeling well. SEEK   IMMEDIATE MEDICAL CARE IF:   You have pain that radiates from your back into your legs.  You develop new bowel or bladder control problems.  You have unusual weakness or numbness in your arms or legs.  You develop nausea or vomiting.  You develop abdominal pain.  You feel faint. Document Released: 04/09/2005 Document Revised: 10/09/2011 Document Reviewed: 08/11/2013 [employer] Patient Information 2015 [employer]. This information is not intended to replace advice given to you by your health care provider. Make sure you  discuss any questions you have with your health care provider.  

## 2014-05-27 NOTE — Progress Notes (Signed)
Pre visit review using our clinic review tool, if applicable. No additional management support is needed unless otherwise documented below in the visit note. 

## 2014-05-27 NOTE — Telephone Encounter (Signed)
Caller name: [name] Relation to pt: self Call back number: Pharmacy:  Reason for call:   Patient forgot to get work note from todays visit. She is on her way back here to get note. Will be returning to work tomorrow.

## 2014-05-28 LAB — MICROALBUMIN / CREATININE URINE RATIO
Creatinine,U: 46.5 mg/dL
Microalb Creat Ratio: 1.5 mg/g (ref 0.0–30.0)
Microalb, Ur: <0.7 mg/dL (ref 0.0–1.9)

## 2014-06-15 ENCOUNTER — Other Ambulatory Visit: Payer: Self-pay | Admitting: Family Medicine

## 2014-09-07 ENCOUNTER — Telehealth: Payer: Self-pay | Admitting: Family Medicine

## 2014-09-07 NOTE — Telephone Encounter (Signed)
Msg left to call the office     KP 

## 2014-09-07 NOTE — Telephone Encounter (Signed)
Caller: Ninfa Giannelli, self Ph#: [phone]  Pt states she was returning call to our office. I do not see documentation of call. States she had voicemail about having mammogram. She states she has not had a mammogram done. Pt asked about next physical. I advised she is due in November but pt did not want to schedule.

## 2014-09-08 NOTE — Telephone Encounter (Signed)
Information needed was in the chart. Has been updated.

## 2014-09-16 NOTE — Telephone Encounter (Signed)
Pt returning your call. Best # [phone]. States Nilda Calamity is not due until October.

## 2014-09-17 ENCOUNTER — Other Ambulatory Visit: Payer: Self-pay | Admitting: Family Medicine

## 2014-09-21 ENCOUNTER — Ambulatory Visit (INDEPENDENT_AMBULATORY_CARE_PROVIDER_SITE_OTHER): Payer: BLUE CROSS/BLUE SHIELD | Admitting: Family Medicine

## 2014-09-21 ENCOUNTER — Encounter: Payer: Self-pay | Admitting: Family Medicine

## 2014-09-21 ENCOUNTER — Ambulatory Visit: Payer: BLUE CROSS/BLUE SHIELD | Admitting: Family Medicine

## 2014-09-21 VITALS — HR 95 | Temp 98.0°F | Ht 68.0 in | Wt 189.8 lb

## 2014-09-21 DIAGNOSIS — E785 Hyperlipidemia, unspecified: Secondary | ICD-10-CM | POA: Diagnosis not present

## 2014-09-21 DIAGNOSIS — E1165 Type 2 diabetes mellitus with hyperglycemia: Secondary | ICD-10-CM

## 2014-09-21 DIAGNOSIS — I10 Essential (primary) hypertension: Secondary | ICD-10-CM | POA: Diagnosis not present

## 2014-09-21 DIAGNOSIS — R002 Palpitations: Secondary | ICD-10-CM | POA: Diagnosis not present

## 2014-09-21 DIAGNOSIS — IMO0002 Reserved for concepts with insufficient information to code with codable children: Secondary | ICD-10-CM

## 2014-09-21 MED ORDER — METFORMIN HCL ER 500 MG PO TB24: ORAL_TABLET | ORAL | Status: DC

## 2014-09-21 NOTE — Progress Notes (Signed)
Patient ID: Angela Garner, female    DOB: 1961-01-12  Age: 54 y.o. MRN: [id number]    Subjective:  Subjective HPI Angela Garner presents for f/u dm, htn, hyperlipidemia.  HPI HYPERTENSION  Blood pressure range-not checking  Chest pain- no      Dyspnea- no Lightheadedness- no   Edema- no Other side effects - no   Medication compliance: good Low salt diet- yes  DIABETES  Blood Sugar ranges-115-140  Polyuria- no New Visual problems- no Hypoglycemic symptoms- no Other side effects-no Medication compliance - good Last eye exam- due Foot exam- today  HYPERLIPIDEMIA  Medication compliance- poor RUQ pain- no  Muscle aches- yes Other side effects-no   Review of Systems  Constitutional: Negative for diaphoresis, activity change, appetite change, fatigue and unexpected weight change.  Eyes: Negative for pain, redness and visual disturbance.  Respiratory: Negative for cough, chest tightness, shortness of breath and wheezing.   Cardiovascular: Negative for chest pain, palpitations and leg swelling.  Endocrine: Negative for cold intolerance, heat intolerance, polydipsia, polyphagia and polyuria.  Genitourinary: Negative for dysuria, frequency and difficulty urinating.  Musculoskeletal: Positive for myalgias.       Myalgias from pravastatin-- stopped when med d/c  Neurological: Negative for dizziness, light-headedness, numbness and headaches.  Psychiatric/Behavioral: Negative for behavioral problems and dysphoric mood. The patient is not nervous/anxious.     History Past Medical History  Diagnosis Date  . Hypertension   . Allergy   . Diabetes mellitus without complication     no meds, only check bs  . Heart murmur   . Hyperlipidemia     She has past surgical history that includes Uterine fibroid surgery.   Her family history includes Clotting disorder in her brother; Dementia in her mother; Diabetes in her father; Heart disease in her father; Hypertension in her father. There  is no history of Colon cancer, Esophageal cancer, Stomach cancer, or Rectal cancer.She reports that she has never smoked. She has never used smokeless tobacco. She reports that she does not drink alcohol or use illicit drugs.  Current Outpatient Prescriptions on File Prior to Visit  Medication Sig Dispense Refill  . amLODipine-olmesartan (AZOR) 5-20 MG per tablet TAKE ONE TABLET BY MOUTH EVERY DAY 30 tablet 5  . darifenacin (ENABLEX) 15 MG 24 hr tablet Take 15 mg by mouth daily.    . Fluticasone-Salmeterol (ADVAIR DISKUS) 250-50 MCG/DOSE AEPB Inhale 1 puff into the lungs 2 (two) times daily. 1 each 3  . glucose blood (ONE TOUCH TEST STRIPS) test strip Test Blood sugar twice a day 100 each 2  . multivitamin-iron-minerals-folic acid (CENTRUM) chewable tablet Chew 1 tablet by mouth daily.    . NON FORMULARY 1 each. Durol-Iron Liver Vitamins    . ONETOUCH DELICA LANCETS FINE MISC 1 each by Does not apply route daily. 100 each 2   No current facility-administered medications on file prior to visit.     Objective:  Objective Physical Exam  Constitutional: She is oriented to person, place, and time. She appears well-developed and well-nourished.  HENT:  Head: Normocephalic and atraumatic.  Eyes: Conjunctivae and EOM are normal.  Neck: Normal range of motion. Neck supple. No JVD present. Carotid bruit is not present. No thyromegaly present.  Cardiovascular: Normal rate, regular rhythm and normal heart sounds.   No murmur heard. Pulmonary/Chest: Effort normal and breath sounds normal. No respiratory distress. She has no wheezes. She has no rales. She exhibits no tenderness.  Musculoskeletal: She exhibits no edema.  Neurological:  She is alert and oriented to person, place, and time.  Psychiatric: She has a normal mood and affect. Her behavior is normal. Judgment and thought content normal.   Pulse 95  Temp(Src) 98 F (36.7 C) (Oral)  Ht 5\' 8"  (1.727 m)  Wt 189 lb 12.8 oz (86.093 kg)  BMI  28.87 kg/m2  SpO2 100% Wt Readings from Last 3 Encounters:  09/21/14 189 lb 12.8 oz (86.093 kg)  05/27/14 192 lb 6.4 oz (87.272 kg)  03/09/14 189 lb (85.73 kg)     Lab Results  Component Value Date   WBC 4.4 03/09/2014   HGB 15.4* 03/09/2014   HCT 45.5 03/09/2014   PLT 180.0 03/09/2014   GLUCOSE 90 05/27/2014   CHOL 154 05/27/2014   TRIG 67.0 05/27/2014   HDL 72.00 05/27/2014   LDLDIRECT 120.9 08/02/2011   LDLCALC 69 05/27/2014   ALT 22 05/27/2014   AST 23 05/27/2014   NA 138 05/27/2014   K 3.8 05/27/2014   CL 101 05/27/2014   CREATININE 0.90 05/27/2014   BUN 26* 05/27/2014   CO2 31 05/27/2014   TSH 1.12 03/09/2014   HGBA1C 6.4 05/27/2014   MICROALBUR <0.7 05/27/2014    US Soft Tissue Head/neck  01/09/2013   *RADIOLOGY REPORT*  Clinical Data: Enlarged thyroid gland by palpation  THYROID ULTRASOUND  Technique: Ultrasound examination of the thyroid gland and adjacent soft tissues was performed.  Comparison:  None.  Findings:  Right thyroid lobe:  2.0 x 5.5 x 1.7 cm Left thyroid lobe:  1.7 x 5.0 x 1.4 cm Isthmus:  0.3 cm  Focal nodules:  No discrete thyroid nodule is identified.  The thyroid parenchyma is diffusely homogeneous.  Lymphadenopathy:  None visualized.  IMPRESSION:  Normal thyroid ultrasound.   Original Report Authenticated By: Jacqulynn Cadet, M.D.     Assessment & Plan:  Plan I have discontinued [name]'s albuterol, beclomethasone, cyclobenzaprine, traMADol, and pravastatin. I have also changed her metFORMIN. Additionally, I am having her maintain her Fluticasone-Salmeterol, NON FORMULARY, multivitamin-iron-minerals-folic acid, ONETOUCH DELICA LANCETS FINE, glucose blood, darifenacin, amLODipine-olmesartan, and co-enzyme Q-10.  Meds ordered this encounter  Medications  . co-enzyme Q-10 30 MG capsule    Sig: Take 30 mg by mouth daily.  . metFORMIN (GLUCOPHAGE-XR) 500 MG 24 hr tablet    Sig: 2 po qpm    Dispense:  60 tablet    Refill:  2    Problem List  Items Addressed This Visit    None    Visit Diagnoses    Essential hypertension    -  Primary    Relevant Orders    Basic metabolic panel    CBC with Differential/Platelet    Microalbumin / creatinine urine ratio    POCT urinalysis dipstick    TSH    Hyperlipidemia        Relevant Orders    Hepatic function panel    Lipid panel    TSH    Diabetes mellitus type II, uncontrolled        Relevant Medications    metFORMIN (GLUCOPHAGE-XR) 500 MG 24 hr tablet    Other Relevant Orders    Basic metabolic panel    CBC with Differential/Platelet    Hemoglobin A1c    TSH    Palpitations        Relevant Orders    TSH       Follow-up: Return in about 3 months (around 12/22/2014), or if symptoms worsen or fail to improve, for diabetes  II, hyperlipidemia, hypertension, fasting, gyn exam.  Garnet Koyanagi, DO

## 2014-09-21 NOTE — Patient Instructions (Signed)

## 2014-09-21 NOTE — Progress Notes (Signed)
Pre visit review using our clinic review tool, if applicable. No additional management support is needed unless otherwise documented below in the visit note. 

## 2014-10-01 ENCOUNTER — Other Ambulatory Visit (INDEPENDENT_AMBULATORY_CARE_PROVIDER_SITE_OTHER): Payer: BLUE CROSS/BLUE SHIELD

## 2014-10-01 DIAGNOSIS — E1165 Type 2 diabetes mellitus with hyperglycemia: Secondary | ICD-10-CM

## 2014-10-01 DIAGNOSIS — IMO0002 Reserved for concepts with insufficient information to code with codable children: Secondary | ICD-10-CM

## 2014-10-01 DIAGNOSIS — R809 Proteinuria, unspecified: Secondary | ICD-10-CM

## 2014-10-01 DIAGNOSIS — R002 Palpitations: Secondary | ICD-10-CM | POA: Diagnosis not present

## 2014-10-01 DIAGNOSIS — I10 Essential (primary) hypertension: Secondary | ICD-10-CM

## 2014-10-01 DIAGNOSIS — E785 Hyperlipidemia, unspecified: Secondary | ICD-10-CM | POA: Diagnosis not present

## 2014-10-01 LAB — BASIC METABOLIC PANEL
BUN: 17 mg/dL (ref 6–23)
CO2: 27 mEq/L (ref 19–32)
Calcium: 9.6 mg/dL (ref 8.4–10.5)
Chloride: 106 mEq/L (ref 96–112)
Creatinine, Ser: 0.76 mg/dL (ref 0.40–1.20)
GFR: 101.61 mL/min (ref >60.00)
Glucose, Bld: 107 mg/dL — ABNORMAL HIGH (ref 70–99)
Potassium: 3.6 mEq/L (ref 3.5–5.1)
Sodium: 142 mEq/L (ref 135–145)

## 2014-10-01 LAB — CBC WITH DIFFERENTIAL/PLATELET
Basophils Absolute: 0 10*3/uL (ref 0.0–0.1)
Basophils Relative: 1 % (ref 0.0–3.0)
Eosinophils Absolute: 0.1 10*3/uL (ref 0.0–0.7)
Eosinophils Relative: 2.8 % (ref 0.0–5.0)
HCT: 45 % (ref 36.0–46.0)
Hemoglobin: 15.4 g/dL — ABNORMAL HIGH (ref 12.0–15.0)
Lymphocytes Relative: 59.5 % — ABNORMAL HIGH (ref 12.0–46.0)
Lymphs Abs: 2.2 10*3/uL (ref 0.7–4.0)
MCHC: 34.3 g/dL (ref 30.0–36.0)
MCV: 91.1 fl (ref 78.0–100.0)
Monocytes Absolute: 0.3 10*3/uL (ref 0.1–1.0)
Monocytes Relative: 9.2 % (ref 3.0–12.0)
Neutro Abs: 1 10*3/uL — ABNORMAL LOW (ref 1.4–7.7)
Neutrophils Relative %: 27.5 % — ABNORMAL LOW (ref 43.0–77.0)
Platelets: 185 10*3/uL (ref 150.0–400.0)
RBC: 4.94 Mil/uL (ref 3.87–5.11)
RDW: 13.3 % (ref 11.5–15.5)
WBC: 3.7 10*3/uL — ABNORMAL LOW (ref 4.0–10.5)

## 2014-10-01 LAB — HEMOGLOBIN A1C: Hgb A1c MFr Bld: 6 % (ref 4.6–6.5)

## 2014-10-01 LAB — HEPATIC FUNCTION PANEL
ALT: 27 U/L (ref 0–35)
AST: 29 U/L (ref 0–37)
Albumin: 4.4 g/dL (ref 3.5–5.2)
Alkaline Phosphatase: 78 U/L (ref 39–117)
Bilirubin, Direct: 0.2 mg/dL (ref 0.0–0.3)
Total Bilirubin: 0.8 mg/dL (ref 0.2–1.2)
Total Protein: 7.3 g/dL (ref 6.0–8.3)

## 2014-10-01 LAB — LIPID PANEL
Cholesterol: 188 mg/dL (ref 0–200)
HDL: 71.6 mg/dL (ref >39.00)
LDL Cholesterol: 108 mg/dL — ABNORMAL HIGH (ref 0–99)
NonHDL: 116.4
Total CHOL/HDL Ratio: 3
Triglycerides: 44 mg/dL (ref 0.0–149.0)
VLDL: 8.8 mg/dL (ref 0.0–40.0)

## 2014-10-01 LAB — MICROALBUMIN / CREATININE URINE RATIO
Creatinine,U: 131.8 mg/dL
Microalb Creat Ratio: 4.3 mg/g (ref 0.0–30.0)
Microalb, Ur: 5.7 mg/dL — ABNORMAL HIGH (ref 0.0–1.9)

## 2014-10-01 LAB — TSH: TSH: 1.85 u[IU]/mL (ref 0.35–4.50)

## 2014-10-06 ENCOUNTER — Telehealth: Payer: Self-pay | Admitting: *Deleted

## 2014-10-06 DIAGNOSIS — D72819 Decreased white blood cell count, unspecified: Secondary | ICD-10-CM

## 2014-10-06 DIAGNOSIS — R809 Proteinuria, unspecified: Secondary | ICD-10-CM

## 2014-10-06 NOTE — Telephone Encounter (Signed)
-----   Message from Rosalita Chessman, DO sent at 10/05/2014 10:40 PM EDT ----- microalbumin elevated----  Check 24 h urine for protein cbcd abnormal--- repeat at pt convenience

## 2014-10-06 NOTE — Telephone Encounter (Signed)
Notified pt and she voices understanding. Lab appt scheduled for 10/08/14.  Lab orders entered.

## 2014-10-08 ENCOUNTER — Other Ambulatory Visit (INDEPENDENT_AMBULATORY_CARE_PROVIDER_SITE_OTHER): Payer: BLUE CROSS/BLUE SHIELD

## 2014-10-08 DIAGNOSIS — D72819 Decreased white blood cell count, unspecified: Secondary | ICD-10-CM | POA: Diagnosis not present

## 2014-10-08 LAB — CBC WITH DIFFERENTIAL/PLATELET
Basophils Absolute: 0 10*3/uL (ref 0.0–0.1)
Basophils Relative: 0.7 % (ref 0.0–3.0)
Eosinophils Absolute: 0.1 10*3/uL (ref 0.0–0.7)
Eosinophils Relative: 1.3 % (ref 0.0–5.0)
HCT: 45 % (ref 36.0–46.0)
Hemoglobin: 15.2 g/dL — ABNORMAL HIGH (ref 12.0–15.0)
Lymphocytes Relative: 36.1 % (ref 12.0–46.0)
Lymphs Abs: 1.9 10*3/uL (ref 0.7–4.0)
MCHC: 33.8 g/dL (ref 30.0–36.0)
MCV: 91.9 fl (ref 78.0–100.0)
Monocytes Absolute: 0.4 10*3/uL (ref 0.1–1.0)
Monocytes Relative: 7.8 % (ref 3.0–12.0)
Neutro Abs: 2.9 10*3/uL (ref 1.4–7.7)
Neutrophils Relative %: 54.1 % (ref 43.0–77.0)
Platelets: 175 10*3/uL (ref 150.0–400.0)
RBC: 4.89 Mil/uL (ref 3.87–5.11)
RDW: 13.6 % (ref 11.5–15.5)
WBC: 5.4 10*3/uL (ref 4.0–10.5)

## 2014-10-13 LAB — PROTEIN, URINE, 24 HOUR
Protein, 24H Urine: 76 mg/d (ref <150)
Protein, Urine: 4 mg/dL — ABNORMAL LOW (ref 5–24)

## 2014-11-17 ENCOUNTER — Other Ambulatory Visit: Payer: Self-pay | Admitting: Family Medicine

## 2014-11-17 NOTE — Telephone Encounter (Signed)
Left msg for pt to call back and schedule appt

## 2014-11-17 NOTE — Telephone Encounter (Signed)
30 day supply of Azor sent to pharmacy.  Pt is due for f/u with Dr Etter Sjogren on 12/22/14.  Please call pt to arrange appt.

## 2014-12-17 ENCOUNTER — Other Ambulatory Visit: Payer: Self-pay | Admitting: Family Medicine

## 2014-12-20 NOTE — Telephone Encounter (Signed)
Pt called stating pharmacy told her refill was denied. Advised due for f/u, schedule for 12/30/14 8:15am. Pt requesting 1 month supply of Azor to be sent in.

## 2014-12-20 NOTE — Telephone Encounter (Signed)
Rx was not denied, we never received RF. I sent it for 6 mos.     KP

## 2014-12-30 ENCOUNTER — Encounter: Payer: Self-pay | Admitting: Family Medicine

## 2014-12-30 ENCOUNTER — Ambulatory Visit (INDEPENDENT_AMBULATORY_CARE_PROVIDER_SITE_OTHER): Payer: BLUE CROSS/BLUE SHIELD | Admitting: Family Medicine

## 2014-12-30 VITALS — BP 134/82 | HR 74 | Temp 98.3°F | Ht 68.0 in | Wt 187.7 lb

## 2014-12-30 DIAGNOSIS — Z1159 Encounter for screening for other viral diseases: Secondary | ICD-10-CM | POA: Diagnosis not present

## 2014-12-30 DIAGNOSIS — R809 Proteinuria, unspecified: Secondary | ICD-10-CM

## 2014-12-30 DIAGNOSIS — E1165 Type 2 diabetes mellitus with hyperglycemia: Secondary | ICD-10-CM | POA: Diagnosis not present

## 2014-12-30 DIAGNOSIS — E785 Hyperlipidemia, unspecified: Secondary | ICD-10-CM

## 2014-12-30 DIAGNOSIS — IMO0002 Reserved for concepts with insufficient information to code with codable children: Secondary | ICD-10-CM

## 2014-12-30 DIAGNOSIS — I10 Essential (primary) hypertension: Secondary | ICD-10-CM | POA: Diagnosis not present

## 2014-12-30 DIAGNOSIS — G4700 Insomnia, unspecified: Secondary | ICD-10-CM

## 2014-12-30 LAB — LIPID PANEL
Cholesterol: 192 mg/dL (ref 0–200)
HDL: 75.2 mg/dL (ref >39.00)
LDL Cholesterol: 109 mg/dL — ABNORMAL HIGH (ref 0–99)
NonHDL: 116.68
Total CHOL/HDL Ratio: 3
Triglycerides: 36 mg/dL (ref 0.0–149.0)
VLDL: 7.2 mg/dL (ref 0.0–40.0)

## 2014-12-30 LAB — MICROALBUMIN / CREATININE URINE RATIO
Creatinine,U: 136.3 mg/dL
Microalb Creat Ratio: 5.6 mg/g (ref 0.0–30.0)
Microalb, Ur: 7.6 mg/dL — ABNORMAL HIGH (ref 0.0–1.9)

## 2014-12-30 LAB — COMPREHENSIVE METABOLIC PANEL
ALT: 26 U/L (ref 0–35)
AST: 23 U/L (ref 0–37)
Albumin: 4.4 g/dL (ref 3.5–5.2)
Alkaline Phosphatase: 76 U/L (ref 39–117)
BUN: 15 mg/dL (ref 6–23)
CO2: 29 mEq/L (ref 19–32)
Calcium: 9.5 mg/dL (ref 8.4–10.5)
Chloride: 106 mEq/L (ref 96–112)
Creatinine, Ser: 0.67 mg/dL (ref 0.40–1.20)
GFR: 117.41 mL/min (ref >60.00)
Glucose, Bld: 119 mg/dL — ABNORMAL HIGH (ref 70–99)
Potassium: 3.1 mEq/L — ABNORMAL LOW (ref 3.5–5.1)
Sodium: 143 mEq/L (ref 135–145)
Total Bilirubin: 0.8 mg/dL (ref 0.2–1.2)
Total Protein: 7.4 g/dL (ref 6.0–8.3)

## 2014-12-30 LAB — HEPATITIS C ANTIBODY: HCV Ab: NEGATIVE

## 2014-12-30 LAB — HEMOGLOBIN A1C: Hgb A1c MFr Bld: 6.1 % (ref 4.6–6.5)

## 2014-12-30 MED ORDER — ALPRAZOLAM 0.5 MG PO TABS: 0.5000 mg | ORAL_TABLET | Freq: Every evening | ORAL | Status: DC | PRN

## 2014-12-30 MED ORDER — AMLODIPINE-OLMESARTAN 5-20 MG PO TABS: 1.0000 | ORAL_TABLET | Freq: Every day | ORAL | Status: DC

## 2014-12-30 NOTE — Patient Instructions (Signed)

## 2014-12-30 NOTE — Progress Notes (Signed)
Patient ID: Angela Garner, female    DOB: [birth year]  Age: 54 y.o. MRN: [id number]    Subjective:  Subjective HPI Angela Garner presents for f/u dm, htn and cholesterol.  Her mother died overnight and she has had no sleep.     HPI HYPERTENSION  Blood pressure range-not checking   Chest pain- no      Dyspnea- no Lightheadedness- no   Edema- no Other side effects - no   Medication compliance: good Low salt diet- yes  DIABETES  Blood Sugar ranges-100s  Polyuria-  no New Visual problems- no Hypoglycemic symptoms- no Other side effects-no Medication compliance - poor Last eye exam- due Foot exam- today  HYPERLIPIDEMIA  Medication compliance- good RUQ pain- no  Muscle aches- no Other side effects-no      Review of Systems  Constitutional: Negative for diaphoresis, appetite change, fatigue and unexpected weight change.  Eyes: Negative for pain, redness and visual disturbance.  Respiratory: Negative for cough, chest tightness, shortness of breath and wheezing.   Cardiovascular: Negative for chest pain, palpitations and leg swelling.  Endocrine: Negative for cold intolerance, heat intolerance, polydipsia, polyphagia and polyuria.  Genitourinary: Negative for dysuria, frequency and difficulty urinating.  Neurological: Negative for dizziness, light-headedness, numbness and headaches.  Psychiatric/Behavioral: Positive for sleep disturbance and dysphoric mood.    History Past Medical History  Diagnosis Date  . Hypertension   . Allergy   . Diabetes mellitus without complication     no meds, only check bs  . Heart murmur   . Hyperlipidemia     She has past surgical history that includes Uterine fibroid surgery.   Her family history includes Clotting disorder in her brother; Dementia in her mother; Diabetes in her father; Heart disease in her father; Hypertension in her father. There is no history of Colon cancer, Esophageal cancer, Stomach cancer, or Rectal cancer.She reports  that she has never smoked. She has never used smokeless tobacco. She reports that she does not drink alcohol or use illicit drugs.  Current Outpatient Prescriptions on File Prior to Visit  Medication Sig Dispense Refill  . co-enzyme Q-10 30 MG capsule Take 30 mg by mouth daily.    . Fluticasone-Salmeterol (ADVAIR DISKUS) 250-50 MCG/DOSE AEPB Inhale 1 puff into the lungs 2 (two) times daily. 1 each 3  . glucose blood (ONE TOUCH TEST STRIPS) test strip Test Blood sugar twice a day 100 each 2  . multivitamin-iron-minerals-folic acid (CENTRUM) chewable tablet Chew 1 tablet by mouth daily.    . NON FORMULARY 1 each. Durol-Iron Liver Vitamins    . ONETOUCH DELICA LANCETS FINE MISC 1 each by Does not apply route daily. 100 each 2  . darifenacin (ENABLEX) 15 MG 24 hr tablet Take 15 mg by mouth daily.    . metFORMIN (GLUCOPHAGE-XR) 500 MG 24 hr tablet 2 po qpm (Patient not taking: Reported on 12/30/2014) 60 tablet 2   No current facility-administered medications on file prior to visit.     Objective:  Objective Physical Exam  Constitutional: She is oriented to person, place, and time. She appears well-developed and well-nourished.  HENT:  Head: Normocephalic and atraumatic.  Eyes: Conjunctivae and EOM are normal.  Neck: Normal range of motion. Neck supple. No JVD present. Carotid bruit is not present. No thyromegaly present.  Cardiovascular: Normal rate, regular rhythm and normal heart sounds.   No murmur heard. Pulmonary/Chest: Effort normal and breath sounds normal. No respiratory distress. She has no wheezes. She has no rales.  She exhibits no tenderness.  Musculoskeletal: She exhibits no edema.  Neurological: She is alert and oriented to person, place, and time.  Psychiatric: Her mood appears not anxious. She exhibits a depressed mood. She expresses no homicidal and no suicidal ideation. She expresses no suicidal plans and no homicidal plans.   BP 134/82 mmHg  Pulse 74  Temp(Src) 98.3 F  (36.8 C) (Oral)  Ht _0  (1.727 m)  Wt 187 lb 11.2 oz (85.14 kg)  BMI 28.55 kg/m2  SpO2 98% Wt Readings from Last 3 Encounters:  12/30/14 187 lb 11.2 oz (85.14 kg)  09/21/14 189 lb 12.8 oz (86.093 kg)  05/27/14 192 lb 6.4 oz (87.272 kg)     Lab Results  Component Value Date   WBC 5.4 10/08/2014   HGB 15.2* 10/08/2014   HCT 45.0 10/08/2014   PLT 175.0 10/08/2014   GLUCOSE 119* 12/30/2014   CHOL 192 12/30/2014   TRIG 36.0 12/30/2014   HDL 75.20 12/30/2014   LDLDIRECT 120.9 08/02/2011   LDLCALC 109* 12/30/2014   ALT 26 12/30/2014   AST 23 12/30/2014   NA 143 12/30/2014   K 3.1* 12/30/2014   CL 106 12/30/2014   CREATININE 0.67 12/30/2014   BUN 15 12/30/2014   CO2 29 12/30/2014   TSH 1.85 10/01/2014   HGBA1C 6.1 12/30/2014   MICROALBUR 7.6* 12/30/2014    US Soft Tissue Head/neck  01/09/2013   *RADIOLOGY REPORT*  Clinical Data: Enlarged thyroid gland by palpation  THYROID ULTRASOUND  Technique: Ultrasound examination of the thyroid gland and adjacent soft tissues was performed.  Comparison:  None.  Findings:  Right thyroid lobe:  2.0 x 5.5 x 1.7 cm Left thyroid lobe:  1.7 x 5.0 x 1.4 cm Isthmus:  0.3 cm  Focal nodules:  No discrete thyroid nodule is identified.  The thyroid parenchyma is diffusely homogeneous.  Lymphadenopathy:  None visualized.  IMPRESSION:  Normal thyroid ultrasound.   Original Report Authenticated By: Jacqulynn Cadet, M.D.     Assessment & Plan:  Plan I have changed [name]'s AZOR to amLODipine-olmesartan. I am also having her start on ALPRAZolam. Additionally, I am having her maintain her Fluticasone-Salmeterol, NON FORMULARY, multivitamin-iron-minerals-folic acid, ONETOUCH DELICA LANCETS FINE, glucose blood, darifenacin, co-enzyme Q-10, and metFORMIN.  Meds ordered this encounter  Medications  . amLODipine-olmesartan (AZOR) 5-20 MG per tablet    Sig: Take 1 tablet by mouth daily.    Dispense:  30 tablet    Refill:  5  . ALPRAZolam (XANAX) 0.5 MG  tablet    Sig: Take 1 tablet (0.5 mg total) by mouth at bedtime as needed for anxiety.    Dispense:  30 tablet    Refill:  0    Problem List Items Addressed This Visit    None    Visit Diagnoses    Diabetes mellitus type II, uncontrolled    -  Primary    Relevant Medications    amLODipine-olmesartan (AZOR) 5-20 MG per tablet    Other Relevant Orders    Comp Met (CMET)    Hemoglobin A1c    Microalbumin / creatinine urine ratio (Completed)    POCT urinalysis dipstick    Hyperlipidemia LDL goal <70        Relevant Medications    amLODipine-olmesartan (AZOR) 5-20 MG per tablet    Other Relevant Orders    Comp Met (CMET)    Lipid panel    Microalbumin / creatinine urine ratio (Completed)    POCT urinalysis dipstick  Need for hepatitis C screening test        Relevant Orders    Hepatitis C antibody (Completed)    Essential hypertension        Relevant Medications    amLODipine-olmesartan (AZOR) 5-20 MG per tablet    Insomnia        Relevant Medications    ALPRAZolam (XANAX) 0.5 MG tablet       Follow-up: Return in about 6 months (around 06/29/2015), or if symptoms worsen or fail to improve, for hypertension, hyperlipidemia, diabetes II.  Garnet Koyanagi, DO

## 2014-12-30 NOTE — Progress Notes (Signed)
Pre visit review using our clinic review tool, if applicable. No additional management support is needed unless otherwise documented below in the visit note. 

## 2015-01-04 ENCOUNTER — Telehealth: Payer: Self-pay | Admitting: *Deleted

## 2015-01-04 DIAGNOSIS — R809 Proteinuria, unspecified: Secondary | ICD-10-CM

## 2015-01-04 NOTE — Telephone Encounter (Signed)
-----   Message from Rosalita Chessman, DO sent at 12/31/2014  8:59 PM EDT ----- Potassium is low---  Eat potassium rich foods--- Orange juice, spinach DM is controlled--ok to stay off metformin microalbumin elevated--- we need to check 24 h urine for protein Recheck 6 months Lipid, cmp, hgba1c

## 2015-01-04 NOTE — Telephone Encounter (Signed)
Called and spoke with the pt on (01/03/15) and informed her for recent lab results and note.  Pt verbalized understanding and agreed.  Pt stated that she will come by and pick-up 24 hour container.  Pt stated that she has an appt already scheduled.  Future lab ordered and sent.//AB/CMA

## 2015-01-10 NOTE — Addendum Note (Signed)
Addended by: Peggyann Shoals on: 01/10/2015 09:50 AM   Modules accepted: Orders

## 2015-01-11 ENCOUNTER — Other Ambulatory Visit: Payer: Self-pay | Admitting: Family Medicine

## 2015-01-11 ENCOUNTER — Telehealth: Payer: Self-pay | Admitting: *Deleted

## 2015-01-11 DIAGNOSIS — R809 Proteinuria, unspecified: Secondary | ICD-10-CM

## 2015-01-11 LAB — PROTEIN, URINE, 24 HOUR
Protein, 24H Urine: 68 mg/d (ref <150)
Protein, Urine: 4 mg/dL — ABNORMAL LOW (ref 5–24)

## 2015-01-11 NOTE — Telephone Encounter (Signed)
Called and spoke with the pt and informed her of recent lab results and note.  Pt verbalized understanding and agreed to the referral to Nephrology.  Referral placed.//AB/CMA

## 2015-01-11 NOTE — Telephone Encounter (Signed)
-----   Message from Rosalita Chessman, DO sent at 01/11/2015  9:21 AM EDT ----- Normal-- same as previous Because microalbumin was high we will refer to nephrology

## 2015-01-14 ENCOUNTER — Ambulatory Visit (INDEPENDENT_AMBULATORY_CARE_PROVIDER_SITE_OTHER): Payer: BLUE CROSS/BLUE SHIELD | Admitting: Family Medicine

## 2015-01-14 ENCOUNTER — Ambulatory Visit: Payer: BLUE CROSS/BLUE SHIELD | Admitting: Physician Assistant

## 2015-01-14 ENCOUNTER — Encounter: Payer: Self-pay | Admitting: Family Medicine

## 2015-01-14 VITALS — BP 138/92 | HR 66 | Temp 98.5°F | Wt 194.8 lb

## 2015-01-14 DIAGNOSIS — Z23 Encounter for immunization: Secondary | ICD-10-CM | POA: Diagnosis not present

## 2015-01-14 DIAGNOSIS — M5442 Lumbago with sciatica, left side: Secondary | ICD-10-CM

## 2015-01-14 DIAGNOSIS — M546 Pain in thoracic spine: Secondary | ICD-10-CM | POA: Diagnosis not present

## 2015-01-14 DIAGNOSIS — M549 Dorsalgia, unspecified: Secondary | ICD-10-CM

## 2015-01-14 HISTORY — DX: Lumbago with sciatica, left side: M54.42

## 2015-01-14 MED ORDER — METHOCARBAMOL 500 MG PO TABS: 500.0000 mg | ORAL_TABLET | Freq: Four times a day (QID) | ORAL | Status: DC

## 2015-01-14 NOTE — Assessment & Plan Note (Signed)
omt / trigger point to paraspinal muscles t4-t7 With some relief of spasm/ pain Muscle relaxer per orders rto prn

## 2015-01-14 NOTE — Progress Notes (Signed)
Patient ID: Angela Garner, female   DOB: [birth year], 54 y.o.   MRN: [id number]   Subjective:    Patient ID: Angela Garner, female    DOB: Angela Garner, 54 y.o.   MRN: [id number]  Chief Complaint  Patient presents with  . Neck Pain    c/o muscle pain x's a 4 days    HPI Patient is in today c/o neck pain.  It has felt stiff.  She has had coworkers massaging it.   Past Medical History  Diagnosis Date  . Hypertension   . Allergy   . Diabetes mellitus without complication     no meds, only check bs  . Heart murmur   . Hyperlipidemia     Past Surgical History  Procedure Laterality Date  . Uterine fibroid surgery      Family History  Problem Relation Age of Onset  . Hypertension Father   . Heart disease Father   . Diabetes Father   . Colon cancer Neg Hx   . Esophageal cancer Neg Hx   . Stomach cancer Neg Hx   . Rectal cancer Neg Hx   . Dementia Mother   . Clotting disorder Brother     Social History   Social History  . Marital Status: Married    Spouse Name: N/A  . Number of Children: N/A  . Years of Education: N/A   Occupational History  . Not on file.   Social History Main Topics  . Smoking status: Never Smoker   . Smokeless tobacco: Never Used  . Alcohol Use: No  . Drug Use: No  . Sexual Activity: Yes   Other Topics Concern  . Not on file   Social History Narrative    Outpatient Prescriptions Prior to Visit  Medication Sig Dispense Refill  . ALPRAZolam (XANAX) 0.5 MG tablet Take 1 tablet (0.5 mg total) by mouth at bedtime as needed for anxiety. 30 tablet 0  . amLODipine-olmesartan (AZOR) 5-20 MG per tablet Take 1 tablet by mouth daily. 30 tablet 5  . co-enzyme Q-10 30 MG capsule Take 30 mg by mouth daily.    Marland Kitchen darifenacin (ENABLEX) 15 MG 24 hr tablet Take 15 mg by mouth daily.    . Fluticasone-Salmeterol (ADVAIR DISKUS) 250-50 MCG/DOSE AEPB Inhale 1 puff into the lungs 2 (two) times daily. 1 each 3  . glucose blood (ONE TOUCH TEST STRIPS) test strip Test  Blood sugar twice a day 100 each 2  . metFORMIN (GLUCOPHAGE-XR) 500 MG 24 hr tablet 2 po qpm (Patient not taking: Reported on 12/30/2014) 60 tablet 2  . multivitamin-iron-minerals-folic acid (CENTRUM) chewable tablet Chew 1 tablet by mouth daily.    . NON FORMULARY 1 each. Durol-Iron Liver Vitamins    . ONETOUCH DELICA LANCETS FINE MISC 1 each by Does not apply route daily. 100 each 2   No facility-administered medications prior to visit.    Allergies  Allergen Reactions  . Penicillins     Unsure of reaction  . Pravachol [Pravastatin Sodium] Other (See Comments)    Muscle aches    Review of Systems  Constitutional: Negative for fever and malaise/fatigue.  HENT: Negative for congestion.   Eyes: Negative for discharge.  Respiratory: Negative for shortness of breath.   Cardiovascular: Negative for chest pain, palpitations and leg swelling.  Gastrointestinal: Negative for nausea and abdominal pain.  Genitourinary: Negative for dysuria.  Musculoskeletal: Positive for back pain. Negative for falls.  Skin: Negative for rash.  Neurological: Negative for loss  of consciousness and headaches.  Endo/Heme/Allergies: Negative for environmental allergies.  Psychiatric/Behavioral: Negative for depression. The patient is not nervous/anxious.        Objective:    Physical Exam  Constitutional: She is oriented to person, place, and time. She appears well-developed and well-nourished.  HENT:  Head: Normocephalic and atraumatic.  Eyes: Conjunctivae and EOM are normal.  Neck: Normal range of motion. Neck supple. No JVD present. Carotid bruit is not present. No thyromegaly present.  Cardiovascular: Normal rate, regular rhythm and normal heart sounds.   No murmur heard. Pulmonary/Chest: Effort normal and breath sounds normal. No respiratory distress. She has no wheezes. She has no rales. She exhibits no tenderness.  Musculoskeletal: She exhibits no edema.  Neurological: She is alert and oriented  to person, place, and time.  Psychiatric: She has a normal mood and affect. Her behavior is normal. Judgment and thought content normal.    BP 138/92 mmHg  Pulse 66  Temp(Src) 98.5 F (36.9 C) (Oral)  Wt 194 lb 12.8 oz (88.361 kg)  SpO2 99% Wt Readings from Last 3 Encounters:  01/14/15 194 lb 12.8 oz (88.361 kg)  12/30/14 187 lb 11.2 oz (85.14 kg)  09/21/14 189 lb 12.8 oz (86.093 kg)     Lab Results  Component Value Date   WBC 5.4 10/08/2014   HGB 15.2* 10/08/2014   HCT 45.0 10/08/2014   PLT 175.0 10/08/2014   GLUCOSE 119* 12/30/2014   CHOL 192 12/30/2014   TRIG 36.0 12/30/2014   HDL 75.20 12/30/2014   LDLDIRECT 120.9 08/02/2011   LDLCALC 109* 12/30/2014   ALT 26 12/30/2014   AST 23 12/30/2014   NA 143 12/30/2014   K 3.1* 12/30/2014   CL 106 12/30/2014   CREATININE 0.67 12/30/2014   BUN 15 12/30/2014   CO2 29 12/30/2014   TSH 1.85 10/01/2014   HGBA1C 6.1 12/30/2014   MICROALBUR 7.6* 12/30/2014    Lab Results  Component Value Date   TSH 1.85 10/01/2014   Lab Results  Component Value Date   WBC 5.4 10/08/2014   HGB 15.2* 10/08/2014   HCT 45.0 10/08/2014   MCV 91.9 10/08/2014   PLT 175.0 10/08/2014   Lab Results  Component Value Date   NA 143 12/30/2014   K 3.1* 12/30/2014   CO2 29 12/30/2014   GLUCOSE 119* 12/30/2014   BUN 15 12/30/2014   CREATININE 0.67 12/30/2014   BILITOT 0.8 12/30/2014   ALKPHOS 76 12/30/2014   AST 23 12/30/2014   ALT 26 12/30/2014   PROT 7.4 12/30/2014   ALBUMIN 4.4 12/30/2014   CALCIUM 9.5 12/30/2014   GFR 117.41 12/30/2014   Lab Results  Component Value Date   CHOL 192 12/30/2014   Lab Results  Component Value Date   HDL 75.20 12/30/2014   Lab Results  Component Value Date   LDLCALC 109* 12/30/2014   Lab Results  Component Value Date   TRIG 36.0 12/30/2014   Lab Results  Component Value Date   CHOLHDL 3 12/30/2014   Lab Results  Component Value Date   HGBA1C 6.1 12/30/2014       Assessment & Plan:     Problem List Items Addressed This Visit    None    Visit Diagnoses    Encounter for immunization    -  Primary       I am having Angela Garner start on methocarbamol. I am also having her maintain her Fluticasone-Salmeterol, NON FORMULARY, multivitamin-iron-minerals-folic acid, ONETOUCH DELICA LANCETS FINE,  glucose blood, darifenacin, co-enzyme Q-10, metFORMIN, amLODipine-olmesartan, and ALPRAZolam.  Meds ordered this encounter  Medications  . methocarbamol (ROBAXIN) 500 MG tablet    Sig: Take 1 tablet (500 mg total) by mouth 4 (four) times daily.    Dispense:  30 tablet    Refill:  [place], DO

## 2015-01-14 NOTE — Patient Instructions (Addendum)
Back Pain, Adult °Low back pain is very common. About 1 in 5 people have back pain. The cause of low back pain is rarely dangerous. The pain often gets better over time. About half of people with a sudden onset of back pain feel better in just 2 weeks. About 8 in 10 people feel better by 6 weeks.  °CAUSES °Some common causes of back pain include: °· Strain of the muscles or ligaments supporting the spine. °· Wear and tear (degeneration) of the spinal discs. °· Arthritis. °· Direct injury to the back. °DIAGNOSIS °Most of the time, the direct cause of low back pain is not known. However, back pain can be treated effectively even when the exact cause of the pain is unknown. Answering your caregiver's questions about your overall health and symptoms is one of the most accurate ways to make sure the cause of your pain is not dangerous. If your caregiver needs more information, he or she may order lab work or imaging tests (X-rays or MRIs). However, even if imaging tests show changes in your back, this usually does not require surgery. °HOME CARE INSTRUCTIONS °For many people, back pain returns. Since low back pain is rarely dangerous, it is often a condition that people can learn to manage on their own.  °· Remain active. It is stressful on the back to sit or stand in one place. Do not sit, drive, or stand in one place for more than 30 minutes at a time. Take short walks on level surfaces as soon as pain allows. Try to increase the length of time you walk each day. °· Do not stay in bed. Resting more than 1 or 2 days can delay your recovery. °· Do not avoid exercise or work. Your body is made to move. It is not dangerous to be active, even though your back may hurt. Your back will likely heal faster if you return to being active before your pain is gone. °· Pay attention to your body when you  bend and lift. Many people have less discomfort when lifting if they bend their knees, keep the load close to their bodies, and  avoid twisting. Often, the most comfortable positions are those that put less stress on your recovering back. °· Find a comfortable position to sleep. Use a firm mattress and lie on your side with your knees slightly bent. If you lie on your back, put a pillow under your knees. °· Only take over-the-counter or prescription medicines as directed by your caregiver. Over-the-counter medicines to reduce pain and inflammation are often the most helpful. Your caregiver may prescribe muscle relaxant drugs. These medicines help dull your pain so you can more quickly return to your normal activities and healthy exercise. °· Put ice on the injured area. °· Put ice in a plastic bag. °· Place a towel between your skin and the bag. °· Leave the ice on for 15-20 minutes, 03-04 times a day for the first 2 to 3 days. After that, ice and heat may be alternated to reduce pain and spasms. °· Ask your caregiver about trying back exercises and gentle massage. This may be of some benefit. °· Avoid feeling anxious or stressed. Stress increases muscle tension and can worsen back pain. It is important to recognize when you are anxious or stressed and learn ways to manage it. Exercise is a great option. °SEEK MEDICAL CARE IF: °· You have pain that is not relieved with rest or medicine. °· You have pain that does not improve in 1 week. °· You have new symptoms. °· You are generally not feeling well. °SEEK   IMMEDIATE MEDICAL CARE IF:  °· You have pain that radiates from your back into your legs. °· You develop new bowel or bladder control problems. °· You have unusual weakness or numbness in your arms or legs. °· You develop nausea or vomiting. °· You develop abdominal pain. °· You feel faint. °Document Released: 04/09/2005 Document Revised: 10/09/2011 Document Reviewed: 08/11/2013 °[employer]® Patient Information ©2015 [employer]. This information is not intended to replace advice given to you by your health care provider. Make sure you  discuss any questions you have with your health care provider. ° °Muscle Cramps and Spasms °Muscle cramps and spasms occur when a muscle or muscles tighten and you have no control over this tightening (involuntary muscle contraction). They are a common problem and can develop in any muscle. The most common place is in the calf muscles of the leg. Both muscle cramps and muscle spasms are involuntary muscle contractions, but they also have differences:  °· Muscle cramps are sporadic and painful. They may last a few seconds to a quarter of an hour. Muscle cramps are often more forceful and last longer than muscle spasms. °· Muscle spasms may or may not be painful. They may also last just a few seconds or much longer. °CAUSES  °It is uncommon for cramps or spasms to be due to a serious underlying problem. In many cases, the cause of cramps or spasms is unknown. Some common causes are:  °· Overexertion.   °· Overuse from repetitive motions (doing the same thing over and over).   °· Remaining in a certain position for a long period of time.   °· Improper preparation, form, or technique while performing a sport or activity.   °· Dehydration.   °· Injury.   °· Side effects of some medicines.   °· Abnormally low levels of the salts and ions in your blood (electrolytes), especially potassium and calcium. This could happen if you are taking water pills (diuretics) or you are pregnant.   °Some underlying medical problems can make it more likely to develop cramps or spasms. These include, but are not limited to:  °· Diabetes.   °· Parkinson disease.   °· Hormone disorders, such as thyroid problems.   °· Alcohol abuse.   °· Diseases specific to muscles, joints, and bones.   °· Blood vessel disease where not enough blood is getting to the muscles.   °HOME CARE INSTRUCTIONS  °· Stay well hydrated. Drink enough water and fluids to keep your urine clear or pale yellow. °· It may be helpful to massage, stretch, and relax the affected  muscle. °· For tight or tense muscles, use a warm towel, heating pad, or hot shower water directed to the affected area. °· If you are sore or have pain after a cramp or spasm, applying ice to the affected area may relieve discomfort. °¨ Put ice in a plastic bag. °¨ Place a towel between your skin and the bag. °¨ Leave the ice on for 15-20 minutes, 03-04 times a day. °· Medicines used to treat a known cause of cramps or spasms may help reduce their frequency or severity. Only take over-the-counter or prescription medicines as directed by your caregiver. °SEEK MEDICAL CARE IF:  °Your cramps or spasms get more severe, more frequent, or do not improve over time.  °MAKE SURE YOU:  °· Understand these instructions. °· Will watch your condition. °· Will get help right away if you are not doing well or get worse. °Document Released: 09/29/2001 Document Revised: 08/04/2012 Document Reviewed: 03/26/2012 °[employer]® Patient Information ©2015 [employer].   This information is not intended to replace advice given to you by your health care provider. Make sure you discuss any questions you have with your health care provider. ° °

## 2015-01-14 NOTE — Progress Notes (Signed)
Pre visit review using our clinic review tool, if applicable. No additional management support is needed unless otherwise documented below in the visit note. 

## 2015-06-03 ENCOUNTER — Ambulatory Visit: Payer: BLUE CROSS/BLUE SHIELD | Admitting: Family Medicine

## 2015-06-03 ENCOUNTER — Encounter: Payer: Self-pay | Admitting: Family Medicine

## 2015-06-03 ENCOUNTER — Ambulatory Visit (INDEPENDENT_AMBULATORY_CARE_PROVIDER_SITE_OTHER): Payer: BLUE CROSS/BLUE SHIELD | Admitting: Family Medicine

## 2015-06-03 VITALS — BP 124/90 | HR 74 | Temp 98.9°F | Ht 68.0 in | Wt 194.0 lb

## 2015-06-03 DIAGNOSIS — E1165 Type 2 diabetes mellitus with hyperglycemia: Secondary | ICD-10-CM | POA: Diagnosis not present

## 2015-06-03 DIAGNOSIS — E118 Type 2 diabetes mellitus with unspecified complications: Secondary | ICD-10-CM

## 2015-06-03 DIAGNOSIS — R059 Cough, unspecified: Secondary | ICD-10-CM

## 2015-06-03 DIAGNOSIS — N393 Stress incontinence (female) (male): Secondary | ICD-10-CM

## 2015-06-03 DIAGNOSIS — R05 Cough: Secondary | ICD-10-CM

## 2015-06-03 DIAGNOSIS — E785 Hyperlipidemia, unspecified: Secondary | ICD-10-CM

## 2015-06-03 DIAGNOSIS — G4700 Insomnia, unspecified: Secondary | ICD-10-CM

## 2015-06-03 DIAGNOSIS — J40 Bronchitis, not specified as acute or chronic: Secondary | ICD-10-CM

## 2015-06-03 DIAGNOSIS — IMO0002 Reserved for concepts with insufficient information to code with codable children: Secondary | ICD-10-CM

## 2015-06-03 DIAGNOSIS — I10 Essential (primary) hypertension: Secondary | ICD-10-CM

## 2015-06-03 DIAGNOSIS — Z114 Encounter for screening for human immunodeficiency virus [HIV]: Secondary | ICD-10-CM

## 2015-06-03 LAB — COMPREHENSIVE METABOLIC PANEL
ALT: 18 U/L (ref 0–35)
AST: 21 U/L (ref 0–37)
Albumin: 4.2 g/dL (ref 3.5–5.2)
Alkaline Phosphatase: 69 U/L (ref 39–117)
BUN: 15 mg/dL (ref 6–23)
CO2: 33 mEq/L — ABNORMAL HIGH (ref 19–32)
Calcium: 9.6 mg/dL (ref 8.4–10.5)
Chloride: 105 mEq/L (ref 96–112)
Creatinine, Ser: 0.8 mg/dL (ref 0.40–1.20)
GFR: 95.54 mL/min (ref >60.00)
Glucose, Bld: 124 mg/dL — ABNORMAL HIGH (ref 70–99)
Potassium: 3.6 mEq/L (ref 3.5–5.1)
Sodium: 143 mEq/L (ref 135–145)
Total Bilirubin: 0.7 mg/dL (ref 0.2–1.2)
Total Protein: 7.5 g/dL (ref 6.0–8.3)

## 2015-06-03 LAB — POCT URINALYSIS DIPSTICK
Bilirubin, UA: NEGATIVE
Blood, UA: NEGATIVE
Glucose, UA: NEGATIVE
Ketones, UA: NEGATIVE
Leukocytes, UA: NEGATIVE
Nitrite, UA: NEGATIVE
Protein, UA: NEGATIVE
Spec Grav, UA: 1.015
Urobilinogen, UA: 0.2
pH, UA: 7

## 2015-06-03 LAB — LIPID PANEL
Cholesterol: 200 mg/dL (ref 0–200)
HDL: 77.5 mg/dL (ref >39.00)
LDL Cholesterol: 112 mg/dL — ABNORMAL HIGH (ref 0–99)
NonHDL: 122.18
Total CHOL/HDL Ratio: 3
Triglycerides: 50 mg/dL (ref 0.0–149.0)
VLDL: 10 mg/dL (ref 0.0–40.0)

## 2015-06-03 LAB — HEMOGLOBIN A1C: Hgb A1c MFr Bld: 6.6 % — ABNORMAL HIGH (ref 4.6–6.5)

## 2015-06-03 LAB — MICROALBUMIN / CREATININE URINE RATIO
Creatinine,U: 136.4 mg/dL
Microalb Creat Ratio: 1.3 mg/g (ref 0.0–30.0)
Microalb, Ur: 1.8 mg/dL (ref 0.0–1.9)

## 2015-06-03 MED ORDER — FLUTICASONE-SALMETEROL 250-50 MCG/DOSE IN AEPB: 1.0000 | INHALATION_SPRAY | Freq: Two times a day (BID) | RESPIRATORY_TRACT | Status: DC

## 2015-06-03 MED ORDER — DARIFENACIN HYDROBROMIDE ER 15 MG PO TB24: 15.0000 mg | ORAL_TABLET | Freq: Every day | ORAL | Status: DC

## 2015-06-03 MED ORDER — AMLODIPINE-OLMESARTAN 5-20 MG PO TABS: 1.0000 | ORAL_TABLET | Freq: Every day | ORAL | Status: DC

## 2015-06-03 MED ORDER — METFORMIN HCL ER 500 MG PO TB24: ORAL_TABLET | ORAL | Status: DC

## 2015-06-03 MED ORDER — ALPRAZOLAM 0.5 MG PO TABS: 0.5000 mg | ORAL_TABLET | Freq: Every evening | ORAL | Status: DC | PRN

## 2015-06-03 MED ORDER — PROMETHAZINE-DM 6.25-15 MG/5ML PO SYRP: 5.0000 mL | ORAL_SOLUTION | Freq: Four times a day (QID) | ORAL | Status: DC | PRN

## 2015-06-03 NOTE — Progress Notes (Signed)
Pre visit review using our clinic review tool, if applicable. No additional management support is needed unless otherwise documented below in the visit note. 

## 2015-06-03 NOTE — Progress Notes (Signed)
Patient ID: Angela Garner, female    DOB: October 05, 1960  Age: 55 y.o. MRN: [id number]    Subjective:  Subjective HPI Angela Garner presents for f/u dm, cholesterol and htn.  HPI HYPERTENSION  Blood pressure range-not checking  Chest pain- no      Dyspnea- no Lightheadedness- no   Edema- no Other side effects - no   Medication compliance: good Low salt diet- yes  DIABETES  Blood Sugar ranges-101-134  Polyuria- no New Visual problems- no Hypoglycemic symptoms- no Other side effects-no Medication compliance - good Last eye exam- spring 2016 Foot exam- today  HYPERLIPIDEMIA  Medication compliance- on no meds  RUQ pain- no  Muscle aches- no Other side effects-no  Review of Systems  Constitutional: Negative for diaphoresis, appetite change, fatigue and unexpected weight change.  Eyes: Negative for pain, redness and visual disturbance.  Respiratory: Negative for cough, chest tightness, shortness of breath and wheezing.   Cardiovascular: Negative for chest pain, palpitations and leg swelling.  Endocrine: Negative for cold intolerance, heat intolerance, polydipsia, polyphagia and polyuria.  Genitourinary: Negative for dysuria, frequency and difficulty urinating.  Neurological: Negative for dizziness, light-headedness, numbness and headaches.    History Past Medical History  Diagnosis Date  . Hypertension   . Allergy   . Diabetes mellitus without complication (HCC)     no meds, only check bs  . Heart murmur   . Hyperlipidemia     She has past surgical history that includes Uterine fibroid surgery.   Her family history includes Clotting disorder in her brother; Dementia in her mother; Diabetes in her father; Heart disease in her father; Hypertension in her father. There is no history of Colon cancer, Esophageal cancer, Stomach cancer, or Rectal cancer.She reports that she has never smoked. She has never used smokeless tobacco. She reports that she does not drink alcohol or use  illicit drugs.  Current Outpatient Prescriptions on File Prior to Visit  Medication Sig Dispense Refill  . co-enzyme Q-10 30 MG capsule Take 30 mg by mouth daily.    Marland Kitchen glucose blood (ONE TOUCH TEST STRIPS) test strip Test Blood sugar twice a day 100 each 2  . methocarbamol (ROBAXIN) 500 MG tablet Take 1 tablet (500 mg total) by mouth 4 (four) times daily. 30 tablet 1  . multivitamin-iron-minerals-folic acid (CENTRUM) chewable tablet Chew 1 tablet by mouth daily.    . NON FORMULARY 1 each. Durol-Iron Liver Vitamins    . ONETOUCH DELICA LANCETS FINE MISC 1 each by Does not apply route daily. 100 each 2   No current facility-administered medications on file prior to visit.     Objective:  Objective Physical Exam  Constitutional: She is oriented to person, place, and time. She appears well-developed and well-nourished.  HENT:  Head: Normocephalic and atraumatic.  Eyes: Conjunctivae and EOM are normal.  Neck: Normal range of motion. Neck supple. No JVD present. Carotid bruit is not present. No thyromegaly present.  Cardiovascular: Normal rate, regular rhythm and normal heart sounds.   No murmur heard. Pulmonary/Chest: Effort normal and breath sounds normal. No respiratory distress. She has no wheezes. She has no rales. She exhibits no tenderness.  Musculoskeletal: She exhibits no edema.  Neurological: She is alert and oriented to person, place, and time.  Psychiatric: She has a normal mood and affect. Her behavior is normal. Judgment and thought content normal.  Nursing note and vitals reviewed. Sensory exam of the foot is normal, tested with the monofilament. Good pulses, no lesions  or ulcers, good peripheral pulses.  BP 124/90 mmHg  Pulse 74  Temp(Src) 98.9 F (37.2 C) (Oral)  Ht '5\' 8"'  (1.727 m)  Wt 194 lb (87.998 kg)  BMI 29.50 kg/m2  SpO2 98% Wt Readings from Last 3 Encounters:  06/03/15 194 lb (87.998 kg)  01/14/15 194 lb 12.8 oz (88.361 kg)  12/30/14 187 lb 11.2 oz (85.14  kg)     Lab Results  Component Value Date   WBC 5.4 10/08/2014   HGB 15.2* 10/08/2014   HCT 45.0 10/08/2014   PLT 175.0 10/08/2014   GLUCOSE 119* 12/30/2014   CHOL 192 12/30/2014   TRIG 36.0 12/30/2014   HDL 75.20 12/30/2014   LDLDIRECT 120.9 08/02/2011   LDLCALC 109* 12/30/2014   ALT 26 12/30/2014   AST 23 12/30/2014   NA 143 12/30/2014   K 3.1* 12/30/2014   CL 106 12/30/2014   CREATININE 0.67 12/30/2014   BUN 15 12/30/2014   CO2 29 12/30/2014   TSH 1.85 10/01/2014   HGBA1C 6.1 12/30/2014   MICROALBUR 7.6* 12/30/2014    US Soft Tissue Head/neck  01/09/2013  *RADIOLOGY REPORT* Clinical Data: Enlarged thyroid gland by palpation THYROID ULTRASOUND Technique: Ultrasound examination of the thyroid gland and adjacent soft tissues was performed. Comparison:  None. Findings: Right thyroid lobe:  2.0 x 5.5 x 1.7 cm Left thyroid lobe:  1.7 x 5.0 x 1.4 cm Isthmus:  0.3 cm Focal nodules:  No discrete thyroid nodule is identified.  The thyroid parenchyma is diffusely homogeneous. Lymphadenopathy:  None visualized. IMPRESSION: Normal thyroid ultrasound. Original Report Authenticated By: Jacqulynn Cadet, M.D.     Assessment & Plan:  Plan I have changed [name]'s amLODipine-olmesartan and darifenacin. I am also having her start on promethazine-dextromethorphan. Additionally, I am having her maintain her NON FORMULARY, multivitamin-iron-minerals-folic acid, ONETOUCH DELICA LANCETS FINE, glucose blood, co-enzyme Q-10, methocarbamol, ALPRAZolam, Fluticasone-Salmeterol, and metFORMIN.  Meds ordered this encounter  Medications  . amLODipine-olmesartan (AZOR) 5-20 MG tablet    Sig: Take 1 tablet by mouth daily.    Dispense:  30 tablet    Refill:  5  . ALPRAZolam (XANAX) 0.5 MG tablet    Sig: Take 1 tablet (0.5 mg total) by mouth at bedtime as needed for anxiety.    Dispense:  30 tablet    Refill:  0  . darifenacin (ENABLEX) 15 MG 24 hr tablet    Sig: Take 1 tablet (15 mg total) by mouth  daily.    Dispense:  30 tablet    Refill:  5  . Fluticasone-Salmeterol (ADVAIR DISKUS) 250-50 MCG/DOSE AEPB    Sig: Inhale 1 puff into the lungs 2 (two) times daily.    Dispense:  1 each    Refill:  3  . metFORMIN (GLUCOPHAGE-XR) 500 MG 24 hr tablet    Sig: 2 po qpm    Dispense:  60 tablet    Refill:  2  . promethazine-dextromethorphan (PROMETHAZINE-DM) 6.25-15 MG/5ML syrup    Sig: Take 5 mLs by mouth 4 (four) times daily as needed.    Dispense:  118 mL    Refill:  0    Problem List Items Addressed This Visit    None    Visit Diagnoses    Uncontrolled type 2 diabetes mellitus with complication, without long-term current use of insulin (HCC)    -  Primary    Relevant Medications    amLODipine-olmesartan (AZOR) 5-20 MG tablet    metFORMIN (GLUCOPHAGE-XR) 500 MG 24 hr tablet  Other Relevant Orders    Comp Met (CMET)    CBC with Differential/Platelet    Lipid panel    Hemoglobin A1c    POCT urinalysis dipstick (Completed)    Microalbumin / creatinine urine ratio    Essential hypertension        Relevant Medications    amLODipine-olmesartan (AZOR) 5-20 MG tablet    Other Relevant Orders    Comp Met (CMET)    CBC with Differential/Platelet    Lipid panel    Hemoglobin A1c    POCT urinalysis dipstick (Completed)    Microalbumin / creatinine urine ratio    Hyperlipidemia LDL goal <70        Relevant Medications    amLODipine-olmesartan (AZOR) 5-20 MG tablet    Other Relevant Orders    Comp Met (CMET)    CBC with Differential/Platelet    Lipid panel    Hemoglobin A1c    POCT urinalysis dipstick (Completed)    Microalbumin / creatinine urine ratio    Insomnia        Relevant Medications    ALPRAZolam (XANAX) 0.5 MG tablet    Bronchitis        Relevant Medications    Fluticasone-Salmeterol (ADVAIR DISKUS) 250-50 MCG/DOSE AEPB    Stress incontinence        Relevant Medications    darifenacin (ENABLEX) 15 MG 24 hr tablet    Encounter for screening for HIV         Relevant Orders    HIV antibody (Completed)    Cough        Relevant Medications    promethazine-dextromethorphan (PROMETHAZINE-DM) 6.25-15 MG/5ML syrup       Follow-up: Return in about 6 months (around 12/01/2015), or if symptoms worsen or fail to improve, for hypertension, hyperlipidemia, diabetes II.  Garnet Koyanagi, DO

## 2015-06-03 NOTE — Patient Instructions (Signed)

## 2015-06-04 LAB — HIV ANTIBODY (ROUTINE TESTING W REFLEX): HIV 1&2 Ab, 4th Generation: NONREACTIVE

## 2015-06-05 LAB — CBC WITH DIFFERENTIAL/PLATELET
Basophils Absolute: 0 10*3/uL (ref 0.0–0.1)
Basophils Relative: 0.7 % (ref 0.0–3.0)
Eosinophils Absolute: 0.1 10*3/uL (ref 0.0–0.7)
Eosinophils Relative: 2.4 % (ref 0.0–5.0)
HCT: 49.1 % — ABNORMAL HIGH (ref 36.0–46.0)
Hemoglobin: 15.6 g/dL — ABNORMAL HIGH (ref 12.0–15.0)
Lymphocytes Relative: 50.2 % — ABNORMAL HIGH (ref 12.0–46.0)
Lymphs Abs: 2.9 10*3/uL (ref 0.7–4.0)
MCHC: 31.8 g/dL (ref 30.0–36.0)
MCV: 97.6 fl (ref 78.0–100.0)
Monocytes Absolute: 0.5 10*3/uL (ref 0.1–1.0)
Monocytes Relative: 9.3 % (ref 3.0–12.0)
Neutro Abs: 2.2 10*3/uL (ref 1.4–7.7)
Neutrophils Relative %: 37.4 % — ABNORMAL LOW (ref 43.0–77.0)
Platelets: 217 10*3/uL (ref 150.0–400.0)
RBC: 5.03 Mil/uL (ref 3.87–5.11)
RDW: 13.8 % (ref 11.5–15.5)
WBC: 5.8 10*3/uL (ref 4.0–10.5)

## 2015-06-06 ENCOUNTER — Encounter: Payer: Self-pay | Admitting: *Deleted

## 2015-06-17 ENCOUNTER — Other Ambulatory Visit: Payer: Self-pay | Admitting: Family Medicine

## 2015-06-25 ENCOUNTER — Encounter: Payer: Self-pay | Admitting: Family Medicine

## 2015-12-13 ENCOUNTER — Ambulatory Visit: Payer: BLUE CROSS/BLUE SHIELD | Admitting: Family Medicine

## 2015-12-15 ENCOUNTER — Ambulatory Visit: Payer: BLUE CROSS/BLUE SHIELD | Admitting: Medical

## 2016-01-10 ENCOUNTER — Ambulatory Visit (INDEPENDENT_AMBULATORY_CARE_PROVIDER_SITE_OTHER): Payer: BLUE CROSS/BLUE SHIELD | Admitting: Family Medicine

## 2016-01-10 ENCOUNTER — Encounter: Payer: Self-pay | Admitting: Family Medicine

## 2016-01-10 VITALS — BP 122/82 | HR 78 | Temp 98.2°F | Resp 17 | Ht 68.0 in | Wt 193.0 lb

## 2016-01-10 DIAGNOSIS — E1165 Type 2 diabetes mellitus with hyperglycemia: Secondary | ICD-10-CM

## 2016-01-10 DIAGNOSIS — IMO0002 Reserved for concepts with insufficient information to code with codable children: Secondary | ICD-10-CM

## 2016-01-10 DIAGNOSIS — G4700 Insomnia, unspecified: Secondary | ICD-10-CM

## 2016-01-10 DIAGNOSIS — Z23 Encounter for immunization: Secondary | ICD-10-CM | POA: Diagnosis not present

## 2016-01-10 DIAGNOSIS — Z0000 Encounter for general adult medical examination without abnormal findings: Secondary | ICD-10-CM | POA: Diagnosis not present

## 2016-01-10 DIAGNOSIS — E1151 Type 2 diabetes mellitus with diabetic peripheral angiopathy without gangrene: Secondary | ICD-10-CM | POA: Diagnosis not present

## 2016-01-10 DIAGNOSIS — E785 Hyperlipidemia, unspecified: Secondary | ICD-10-CM

## 2016-01-10 DIAGNOSIS — J40 Bronchitis, not specified as acute or chronic: Secondary | ICD-10-CM

## 2016-01-10 DIAGNOSIS — I10 Essential (primary) hypertension: Secondary | ICD-10-CM | POA: Diagnosis not present

## 2016-01-10 DIAGNOSIS — F411 Generalized anxiety disorder: Secondary | ICD-10-CM

## 2016-01-10 NOTE — Progress Notes (Signed)
Subjective:     Angela Garner is a 55 y.o. female and is here for a comprehensive physical exam. The patient reports no problems.  Social History   Social History  . Marital status: Married    Spouse name: N/A  . Number of children: N/A  . Years of education: N/A   Occupational History  . Not on file.   Social History Main Topics  . Smoking status: Never Smoker  . Smokeless tobacco: Never Used  . Alcohol use No  . Drug use: No  . Sexual activity: Yes   Other Topics Concern  . Not on file   Social History Narrative  . No narrative on file   Health Maintenance  Topic Date Due  . HEMOGLOBIN A1C  12/01/2015  . OPHTHALMOLOGY EXAM  12/23/2015  . MAMMOGRAM  02/04/2016  . FOOT EXAM  06/02/2016  . PNEUMOCOCCAL POLYSACCHARIDE VACCINE (2) 01/07/2018  . TETANUS/TDAP  02/03/2018  . PAP SMEAR  12/27/2018  . COLONOSCOPY  05/12/2023  . INFLUENZA VACCINE  Completed  . Hepatitis C Screening  Completed  . HIV Screening  Completed    The following portions of the patient's history were reviewed and updated as appropriate:  She  has a past medical history of Allergy; Diabetes mellitus without complication ([place]); Heart murmur; Hyperlipidemia; and Hypertension. She  does not have any pertinent problems on file. She  has a past surgical history that includes Uterine fibroid surgery. Her family history includes Clotting disorder in her brother; Dementia in her mother; Diabetes in her father; Heart disease in her father; Hypertension in her father. She  reports that she has never smoked. She has never used smokeless tobacco. She reports that she does not drink alcohol or use drugs. She has a current medication list which includes the following prescription(s): alprazolam, amlodipine-olmesartan, co-enzyme q-10, darifenacin, fluticasone-salmeterol, glucose blood, multivitamin-iron-minerals-folic acid, onetouch delica lancets fine, oxybutynin, promethazine-dextromethorphan, and  methocarbamol. Current Outpatient Prescriptions on File Prior to Visit  Medication Sig Dispense Refill  . co-enzyme Q-10 30 MG capsule Take 30 mg by mouth daily.    Marland Kitchen darifenacin (ENABLEX) 15 MG 24 hr tablet Take 1 tablet (15 mg total) by mouth daily. 30 tablet 5  . Fluticasone-Salmeterol (ADVAIR DISKUS) 250-50 MCG/DOSE AEPB Inhale 1 puff into the lungs 2 (two) times daily. 1 each 3  . glucose blood (ONE TOUCH TEST STRIPS) test strip Test Blood sugar twice a day 100 each 2  . multivitamin-iron-minerals-folic acid (CENTRUM) chewable tablet Chew 1 tablet by mouth daily.    Glory Rosebush DELICA LANCETS FINE MISC 1 each by Does not apply route daily. 100 each 2  . promethazine-dextromethorphan (PROMETHAZINE-DM) 6.25-15 MG/5ML syrup Take 5 mLs by mouth 4 (four) times daily as needed. 118 mL 0  . methocarbamol (ROBAXIN) 500 MG tablet Take 1 tablet (500 mg total) by mouth 4 (four) times daily. (Patient not taking: Reported on 01/10/2016) 30 tablet 1   No current facility-administered medications on file prior to visit.    She is allergic to penicillins and pravachol [pravastatin sodium]..  Review of Systems Review of Systems  Constitutional: Negative for activity change, appetite change and fatigue.  HENT: Negative for hearing loss, congestion, tinnitus and ear discharge.  dentist q33m Eyes: Negative for visual disturbance (see optho q1y -- vision corrected to 20/20 with glasses).  Respiratory: Negative for cough, chest tightness and shortness of breath.   Cardiovascular: Negative for chest pain, palpitations and leg swelling.  Gastrointestinal: Negative for abdominal pain, diarrhea,  constipation and abdominal distention.  Genitourinary: Negative for urgency, frequency, decreased urine volume and difficulty urinating.  Musculoskeletal: Negative for back pain, arthralgias and gait problem.  Skin: Negative for color change, pallor and rash.  Neurological: Negative for dizziness, light-headedness,  numbness and headaches.  Hematological: Negative for adenopathy. Does not bruise/bleed easily.  Psychiatric/Behavioral: Negative for suicidal ideas, confusion, sleep disturbance, self-injury, dysphoric mood, decreased concentration and agitation.             Objective:    BP 122/82 (BP Location: Right Arm, Patient Position: Sitting, Cuff Size: Normal)   Pulse 78   Temp 98.2 F (36.8 C) (Oral)   Resp 17   Ht 5\' 8"  (1.727 m)   Wt 193 lb (87.5 kg)   SpO2 97%   BMI 29.35 kg/m  General appearance: alert, cooperative, appears stated age and no distress Head: Normocephalic, without obvious abnormality, atraumatic Eyes: negative findings: lids and lashes normal, conjunctivae and sclerae normal and pupils equal, round, reactive to light and accomodation Ears: normal TM's and external ear canals both ears Nose: Nares normal. Septum midline. Mucosa normal. No drainage or sinus tenderness. Throat: lips, mucosa, and tongue normal; teeth and gums normal Neck: no adenopathy, no carotid bruit, no JVD, supple, symmetrical, trachea midline and thyroid not enlarged, symmetric, no tenderness/mass/nodules Back: symmetric, no curvature. ROM normal. No CVA tenderness. Lungs: clear to auscultation bilaterally Breasts: gyn Heart: regular rate and rhythm, S1, S2 normal, no murmur, click, rub or gallop Abdomen: soft, non-tender; bowel sounds normal; no masses,  no organomegaly Pelvic: deferred --gyn Extremities: extremities normal, atraumatic, no cyanosis or edema Pulses: 2+ and symmetric Skin: Skin color, texture, turgor normal. No rashes or lesions Lymph nodes: Cervical, supraclavicular, and axillary nodes normal. Neurologic: Alert and oriented X 3, normal strength and tone. Normal symmetric reflexes. Normal coordination and gait    Assessment:    Healthy female exam.      Plan:    ghm utd Check labs See After Visit Summary for Counseling Recommendations    1. DM (diabetes mellitus)  type II uncontrolled, periph vascular disorder (HCC) Diet controlled - Hemoglobin A1c - Comprehensive metabolic panel  2. Essential hypertension Stable con't azor - CBC with Differential/Platelet - Comprehensive metabolic panel - amLODipine-olmesartan (AZOR) 5-20 MG tablet; Take 1 tablet by mouth daily.  Dispense: 30 tablet; Refill: 5  3. Hyperlipidemia LDL goal <70 Check labs - Lipid panel - Comprehensive metabolic panel  4. Preventative health care See above - TSH - Lipid panel - CBC with Differential/Platelet - Hemoglobin A1c - Comprehensive metabolic panel  5. Encounter for immunization  - oxybutynin (DITROPAN) 5 MG tablet; Take 5 mg by mouth 3 (three) times daily. - TSH - Lipid panel - CBC with Differential/Platelet - Hemoglobin A1c - Comprehensive metabolic panel - Flu Vaccine QUAD 36+ mos IM  6. Bronchitis   7. Insomnia  - ALPRAZolam (XANAX) 0.5 MG tablet; Take 1 tablet (0.5 mg total) by mouth at bedtime as needed for anxiety.  Dispense: 30 tablet; Refill: 0  8. Generalized anxiety disorder

## 2016-01-10 NOTE — Patient Instructions (Signed)
Preventive Care for Adults, Female A healthy lifestyle and preventive care can promote health and wellness. Preventive health guidelines for women include the following key practices.  A routine yearly physical is a good way to check with your health care provider about your health and preventive screening. It is a chance to share any concerns and updates on your health and to receive a thorough exam.  Visit your dentist for a routine exam and preventive care every 6 months. Brush your teeth twice a day and floss once a day. Good oral hygiene prevents tooth decay and gum disease.  The frequency of eye exams is based on your age, health, family medical history, use of contact lenses, and other factors. Follow your health care provider's recommendations for frequency of eye exams.  Eat a healthy diet. Foods like vegetables, fruits, whole grains, low-fat dairy products, and lean protein foods contain the nutrients you need without too many calories. Decrease your intake of foods high in solid fats, added sugars, and salt. Eat the right amount of calories for you.Get information about a proper diet from your health care provider, if necessary.  Regular physical exercise is one of the most important things you can do for your health. Most adults should get at least 150 minutes of moderate-intensity exercise (any activity that increases your heart rate and causes you to sweat) each week. In addition, most adults need muscle-strengthening exercises on 2 or more days a week.  Maintain a healthy weight. The body mass index (BMI) is a screening tool to identify possible weight problems. It provides an estimate of body fat based on height and weight. Your health care provider can find your BMI and can help you achieve or maintain a healthy weight.For adults 20 years and older:  A BMI below 18.5 is considered underweight.  A BMI of 18.5 to 24.9 is normal.  A BMI of 25 to 29.9 is considered overweight.  A  BMI of 30 and above is considered obese.  Maintain normal blood lipids and cholesterol levels by exercising and minimizing your intake of saturated fat. Eat a balanced diet with plenty of fruit and vegetables. Blood tests for lipids and cholesterol should begin at age 45 and be repeated every 5 years. If your lipid or cholesterol levels are high, you are over 50, or you are at high risk for heart disease, you may need your cholesterol levels checked more frequently.Ongoing high lipid and cholesterol levels should be treated with medicines if diet and exercise are not working.  If you smoke, find out from your health care provider how to quit. If you do not use tobacco, do not start.  Lung cancer screening is recommended for adults aged 45-80 years who are at high risk for developing lung cancer because of a history of smoking. A yearly low-dose CT scan of the lungs is recommended for people who have at least a 30-pack-year history of smoking and are a current smoker or have quit within the past 15 years. A pack year of smoking is smoking an average of 1 pack of cigarettes a day for 1 year (for example: 1 pack a day for 30 years or 2 packs a day for 15 years). Yearly screening should continue until the smoker has stopped smoking for at least 15 years. Yearly screening should be stopped for people who develop a health problem that would prevent them from having lung cancer treatment.  If you are pregnant, do not drink alcohol. If you are  breastfeeding, be very cautious about drinking alcohol. If you are not pregnant and choose to drink alcohol, do not have more than 1 drink per day. One drink is considered to be 12 ounces (355 mL) of beer, 5 ounces (148 mL) of wine, or 1.5 ounces (44 mL) of liquor.  Avoid use of street drugs. Do not share needles with anyone. Ask for help if you need support or instructions about stopping the use of drugs.  High blood pressure causes heart disease and increases the risk  of stroke. Your blood pressure should be checked at least every 1 to 2 years. Ongoing high blood pressure should be treated with medicines if weight loss and exercise do not work.  If you are 55-79 years old, ask your health care provider if you should take aspirin to prevent strokes.  Diabetes screening is done by taking a blood sample to check your blood glucose level after you have not eaten for a certain period of time (fasting). If you are not overweight and you do not have risk factors for diabetes, you should be screened once every 3 years starting at age 45. If you are overweight or obese and you are 40-70 years of age, you should be screened for diabetes every year as part of your cardiovascular risk assessment.  Breast cancer screening is essential preventive care for women. You should practice "breast self-awareness." This means understanding the normal appearance and feel of your breasts and may include breast self-examination. Any changes detected, no matter how small, should be reported to a health care provider. Women in their 20s and 30s should have a clinical breast exam (CBE) by a health care provider as part of a regular health exam every 1 to 3 years. After age 40, women should have a CBE every year. Starting at age 40, women should consider having a mammogram (breast X-ray test) every year. Women who have a family history of breast cancer should talk to their health care provider about genetic screening. Women at a high risk of breast cancer should talk to their health care providers about having an MRI and a mammogram every year.  Breast cancer gene (BRCA)-related cancer risk assessment is recommended for women who have family members with BRCA-related cancers. BRCA-related cancers include breast, ovarian, tubal, and peritoneal cancers. Having family members with these cancers may be associated with an increased risk for harmful changes (mutations) in the breast cancer genes BRCA1 and  BRCA2. Results of the assessment will determine the need for genetic counseling and BRCA1 and BRCA2 testing.  Your health care provider may recommend that you be screened regularly for cancer of the pelvic organs (ovaries, uterus, and vagina). This screening involves a pelvic examination, including checking for microscopic changes to the surface of your cervix (Pap test). You may be encouraged to have this screening done every 3 years, beginning at age 21.  For women ages 30-65, health care providers may recommend pelvic exams and Pap testing every 3 years, or they may recommend the Pap and pelvic exam, combined with testing for human papilloma virus (HPV), every 5 years. Some types of HPV increase your risk of cervical cancer. Testing for HPV may also be done on women of any age with unclear Pap test results.  Other health care providers may not recommend any screening for nonpregnant women who are considered low risk for pelvic cancer and who do not have symptoms. Ask your health care provider if a screening pelvic exam is right for   you.  If you have had past treatment for cervical cancer or a condition that could lead to cancer, you need Pap tests and screening for cancer for at least 20 years after your treatment. If Pap tests have been discontinued, your risk factors (such as having a new sexual partner) need to be reassessed to determine if screening should resume. Some women have medical problems that increase the chance of getting cervical cancer. In these cases, your health care provider may recommend more frequent screening and Pap tests.  Colorectal cancer can be detected and often prevented. Most routine colorectal cancer screening begins at the age of 50 years and continues through age 75 years. However, your health care provider may recommend screening at an earlier age if you have risk factors for colon cancer. On a yearly basis, your health care provider may provide home test kits to check  for hidden blood in the stool. Use of a small camera at the end of a tube, to directly examine the colon (sigmoidoscopy or colonoscopy), can detect the earliest forms of colorectal cancer. Talk to your health care provider about this at age 50, when routine screening begins. Direct exam of the colon should be repeated every 5-10 years through age 75 years, unless early forms of precancerous polyps or small growths are found.  People who are at an increased risk for hepatitis B should be screened for this virus. You are considered at high risk for hepatitis B if:  You were born in a country where hepatitis B occurs often. Talk with your health care provider about which countries are considered high risk.  Your parents were born in a high-risk country and you have not received a shot to protect against hepatitis B (hepatitis B vaccine).  You have HIV or AIDS.  You use needles to inject street drugs.  You live with, or have sex with, someone who has hepatitis B.  You get hemodialysis treatment.  You take certain medicines for conditions like cancer, organ transplantation, and autoimmune conditions.  Hepatitis C blood testing is recommended for all people born from 1945 through 1965 and any individual with known risks for hepatitis C.  Practice safe sex. Use condoms and avoid high-risk sexual practices to reduce the spread of sexually transmitted infections (STIs). STIs include gonorrhea, chlamydia, syphilis, trichomonas, herpes, HPV, and human immunodeficiency virus (HIV). Herpes, HIV, and HPV are viral illnesses that have no cure. They can result in disability, cancer, and death.  You should be screened for sexually transmitted illnesses (STIs) including gonorrhea and chlamydia if:  You are sexually active and are younger than 24 years.  You are older than 24 years and your health care provider tells you that you are at risk for this type of infection.  Your sexual activity has changed  since you were last screened and you are at an increased risk for chlamydia or gonorrhea. Ask your health care provider if you are at risk.  If you are at risk of being infected with HIV, it is recommended that you take a prescription medicine daily to prevent HIV infection. This is called preexposure prophylaxis (PrEP). You are considered at risk if:  You are sexually active and do not regularly use condoms or know the HIV status of your partner(s).  You take drugs by injection.  You are sexually active with a partner who has HIV.  Talk with your health care provider about whether you are at high risk of being infected with HIV. If   you choose to begin PrEP, you should first be tested for HIV. You should then be tested every 3 months for as long as you are taking PrEP.  Osteoporosis is a disease in which the bones lose minerals and strength with aging. This can result in serious bone fractures or breaks. The risk of osteoporosis can be identified using a bone density scan. Women ages 67 years and over and women at risk for fractures or osteoporosis should discuss screening with their health care providers. Ask your health care provider whether you should take a calcium supplement or vitamin D to reduce the rate of osteoporosis.  Menopause can be associated with physical symptoms and risks. Hormone replacement therapy is available to decrease symptoms and risks. You should talk to your health care provider about whether hormone replacement therapy is right for you.  Use sunscreen. Apply sunscreen liberally and repeatedly throughout the day. You should seek shade when your shadow is shorter than you. Protect yourself by wearing long sleeves, pants, a wide-brimmed hat, and sunglasses year round, whenever you are outdoors.  Once a month, do a whole body skin exam, using a mirror to look at the skin on your back. Tell your health care provider of new moles, moles that have irregular borders, moles that  are larger than a pencil eraser, or moles that have changed in shape or color.  Stay current with required vaccines (immunizations).  Influenza vaccine. All adults should be immunized every year.  Tetanus, diphtheria, and acellular pertussis (Td, Tdap) vaccine. Pregnant women should receive 1 dose of Tdap vaccine during each pregnancy. The dose should be obtained regardless of the length of time since the last dose. Immunization is preferred during the 27th-36th week of gestation. An adult who has not previously received Tdap or who does not know her vaccine status should receive 1 dose of Tdap. This initial dose should be followed by tetanus and diphtheria toxoids (Td) booster doses every 10 years. Adults with an unknown or incomplete history of completing a 3-dose immunization series with Td-containing vaccines should begin or complete a primary immunization series including a Tdap dose. Adults should receive a Td booster every 10 years.  Varicella vaccine. An adult without evidence of immunity to varicella should receive 2 doses or a second dose if she has previously received 1 dose. Pregnant females who do not have evidence of immunity should receive the first dose after pregnancy. This first dose should be obtained before leaving the health care facility. The second dose should be obtained 4-8 weeks after the first dose.  Human papillomavirus (HPV) vaccine. Females aged 13-26 years who have not received the vaccine previously should obtain the 3-dose series. The vaccine is not recommended for use in pregnant females. However, pregnancy testing is not needed before receiving a dose. If a female is found to be pregnant after receiving a dose, no treatment is needed. In that case, the remaining doses should be delayed until after the pregnancy. Immunization is recommended for any person with an immunocompromised condition through the age of 61 years if she did not get any or all doses earlier. During the  3-dose series, the second dose should be obtained 4-8 weeks after the first dose. The third dose should be obtained 24 weeks after the first dose and 16 weeks after the second dose.  Zoster vaccine. One dose is recommended for adults aged 30 years or older unless certain conditions are present.  Measles, mumps, and rubella (MMR) vaccine. Adults born  before 1957 generally are considered immune to measles and mumps. Adults born in 1957 or later should have 1 or more doses of MMR vaccine unless there is a contraindication to the vaccine or there is laboratory evidence of immunity to each of the three diseases. A routine second dose of MMR vaccine should be obtained at least 28 days after the first dose for students attending postsecondary schools, health care workers, or international travelers. People who received inactivated measles vaccine or an unknown type of measles vaccine during 1963-1967 should receive 2 doses of MMR vaccine. People who received inactivated mumps vaccine or an unknown type of mumps vaccine before 1979 and are at high risk for mumps infection should consider immunization with 2 doses of MMR vaccine. For females of childbearing age, rubella immunity should be determined. If there is no evidence of immunity, females who are not pregnant should be vaccinated. If there is no evidence of immunity, females who are pregnant should delay immunization until after pregnancy. Unvaccinated health care workers born before 1957 who lack laboratory evidence of measles, mumps, or rubella immunity or laboratory confirmation of disease should consider measles and mumps immunization with 2 doses of MMR vaccine or rubella immunization with 1 dose of MMR vaccine.  Pneumococcal 13-valent conjugate (PCV13) vaccine. When indicated, a person who is uncertain of his immunization history and has no record of immunization should receive the PCV13 vaccine. All adults 65 years of age and older should receive this  vaccine. An adult aged 19 years or older who has certain medical conditions and has not been previously immunized should receive 1 dose of PCV13 vaccine. This PCV13 should be followed with a dose of pneumococcal polysaccharide (PPSV23) vaccine. Adults who are at high risk for pneumococcal disease should obtain the PPSV23 vaccine at least 8 weeks after the dose of PCV13 vaccine. Adults older than 56 years of age who have normal immune system function should obtain the PPSV23 vaccine dose at least 1 year after the dose of PCV13 vaccine.  Pneumococcal polysaccharide (PPSV23) vaccine. When PCV13 is also indicated, PCV13 should be obtained first. All adults aged 65 years and older should be immunized. An adult younger than age 65 years who has certain medical conditions should be immunized. Any person who resides in a nursing home or long-term care facility should be immunized. An adult smoker should be immunized. People with an immunocompromised condition and certain other conditions should receive both PCV13 and PPSV23 vaccines. People with human immunodeficiency virus (HIV) infection should be immunized as soon as possible after diagnosis. Immunization during chemotherapy or radiation therapy should be avoided. Routine use of PPSV23 vaccine is not recommended for American Indians, [place] Natives, or people younger than 65 years unless there are medical conditions that require PPSV23 vaccine. When indicated, people who have unknown immunization and have no record of immunization should receive PPSV23 vaccine. One-time revaccination 5 years after the first dose of PPSV23 is recommended for people aged 19-64 years who have chronic kidney failure, nephrotic syndrome, asplenia, or immunocompromised conditions. People who received 1-2 doses of PPSV23 before age 65 years should receive another dose of PPSV23 vaccine at age 65 years or later if at least 5 years have passed since the previous dose. Doses of PPSV23 are not  needed for people immunized with PPSV23 at or after age 65 years.  Meningococcal vaccine. Adults with asplenia or persistent complement component deficiencies should receive 2 doses of quadrivalent meningococcal conjugate (MenACWY-D) vaccine. The doses should be obtained   at least 2 months apart. Microbiologists working with certain meningococcal bacteria, [place] recruits, people at risk during an outbreak, and people who travel to or live in countries with a high rate of meningitis should be immunized. A first-year college student up through age 34 years who is living in a residence hall should receive a dose if she did not receive a dose on or after her 16th birthday. Adults who have certain high-risk conditions should receive one or more doses of vaccine.  Hepatitis A vaccine. Adults who wish to be protected from this disease, have certain high-risk conditions, work with hepatitis A-infected animals, work in hepatitis A research labs, or travel to or work in countries with a high rate of hepatitis A should be immunized. Adults who were previously unvaccinated and who anticipate close contact with an international adoptee during the first 60 days after arrival in the [place] from a country with a high rate of hepatitis A should be immunized.  Hepatitis B vaccine. Adults who wish to be protected from this disease, have certain high-risk conditions, may be exposed to blood or other infectious body fluids, are household contacts or sex partners of hepatitis B positive people, are clients or workers in certain care facilities, or travel to or work in countries with a high rate of hepatitis B should be immunized.  Haemophilus influenzae type b (Hib) vaccine. A previously unvaccinated person with asplenia or sickle cell disease or having a scheduled splenectomy should receive 1 dose of Hib vaccine. Regardless of previous immunization, a recipient of a hematopoietic stem cell transplant should receive a  3-dose series 6-12 months after her successful transplant. Hib vaccine is not recommended for adults with HIV infection. Preventive Services / Frequency Ages 35 to 4 years  Blood pressure check.** / Every 3-5 years.  Lipid and cholesterol check.** / Every 5 years beginning at age 60.  Clinical breast exam.** / Every 3 years for women in their 71s and 10s.  BRCA-related cancer risk assessment.** / For women who have family members with a BRCA-related cancer (breast, ovarian, tubal, or peritoneal cancers).  Pap test.** / Every 2 years from ages 76 through 26. Every 3 years starting at age 61 through age 76 or 93 with a history of 3 consecutive normal Pap tests.  HPV screening.** / Every 3 years from ages 37 through ages 60 to 51 with a history of 3 consecutive normal Pap tests.  Hepatitis C blood test.** / For any individual with known risks for hepatitis C.  Skin self-exam. / Monthly.  Influenza vaccine. / Every year.  Tetanus, diphtheria, and acellular pertussis (Tdap, Td) vaccine.** / Consult your health care provider. Pregnant women should receive 1 dose of Tdap vaccine during each pregnancy. 1 dose of Td every 10 years.  Varicella vaccine.** / Consult your health care provider. Pregnant females who do not have evidence of immunity should receive the first dose after pregnancy.  HPV vaccine. / 3 doses over 6 months, if 93 and younger. The vaccine is not recommended for use in pregnant females. However, pregnancy testing is not needed before receiving a dose.  Measles, mumps, rubella (MMR) vaccine.** / You need at least 1 dose of MMR if you were born in 1957 or later. You may also need a 2nd dose. For females of childbearing age, rubella immunity should be determined. If there is no evidence of immunity, females who are not pregnant should be vaccinated. If there is no evidence of immunity, females who are  pregnant should delay immunization until after pregnancy.  Pneumococcal  13-valent conjugate (PCV13) vaccine.** / Consult your health care provider.  Pneumococcal polysaccharide (PPSV23) vaccine.** / 1 to 2 doses if you smoke cigarettes or if you have certain conditions.  Meningococcal vaccine.** / 1 dose if you are age 68 to 8 years and a Market researcher living in a residence hall, or have one of several medical conditions, you need to get vaccinated against meningococcal disease. You may also need additional booster doses.  Hepatitis A vaccine.** / Consult your health care provider.  Hepatitis B vaccine.** / Consult your health care provider.  Haemophilus influenzae type b (Hib) vaccine.** / Consult your health care provider. Ages 7 to 53 years  Blood pressure check.** / Every year.  Lipid and cholesterol check.** / Every 5 years beginning at age 25 years.  Lung cancer screening. / Every year if you are aged 11-80 years and have a 30-pack-year history of smoking and currently smoke or have quit within the past 15 years. Yearly screening is stopped once you have quit smoking for at least 15 years or develop a health problem that would prevent you from having lung cancer treatment.  Clinical breast exam.** / Every year after age 48 years.  BRCA-related cancer risk assessment.** / For women who have family members with a BRCA-related cancer (breast, ovarian, tubal, or peritoneal cancers).  Mammogram.** / Every year beginning at age 41 years and continuing for as long as you are in good health. Consult with your health care provider.  Pap test.** / Every 3 years starting at age 65 years through age 37 or 70 years with a history of 3 consecutive normal Pap tests.  HPV screening.** / Every 3 years from ages 72 years through ages 60 to 40 years with a history of 3 consecutive normal Pap tests.  Fecal occult blood test (FOBT) of stool. / Every year beginning at age 21 years and continuing until age 5 years. You may not need to do this test if you get  a colonoscopy every 10 years.  Flexible sigmoidoscopy or colonoscopy.** / Every 5 years for a flexible sigmoidoscopy or every 10 years for a colonoscopy beginning at age 35 years and continuing until age 48 years.  Hepatitis C blood test.** / For all people born from [birth year] through 1965 and any individual with known risks for hepatitis C.  Skin self-exam. / Monthly.  Influenza vaccine. / Every year.  Tetanus, diphtheria, and acellular pertussis (Tdap/Td) vaccine.** / Consult your health care provider. Pregnant women should receive 1 dose of Tdap vaccine during each pregnancy. 1 dose of Td every 10 years.  Varicella vaccine.** / Consult your health care provider. Pregnant females who do not have evidence of immunity should receive the first dose after pregnancy.  Zoster vaccine.** / 1 dose for adults aged 30 years or older.  Measles, mumps, rubella (MMR) vaccine.** / You need at least 1 dose of MMR if you were born in 1957 or later. You may also need a second dose. For females of childbearing age, rubella immunity should be determined. If there is no evidence of immunity, females who are not pregnant should be vaccinated. If there is no evidence of immunity, females who are pregnant should delay immunization until after pregnancy.  Pneumococcal 13-valent conjugate (PCV13) vaccine.** / Consult your health care provider.  Pneumococcal polysaccharide (PPSV23) vaccine.** / 1 to 2 doses if you smoke cigarettes or if you have certain conditions.  Meningococcal vaccine.** /  Consult your health care provider.  Hepatitis A vaccine.** / Consult your health care provider.  Hepatitis B vaccine.** / Consult your health care provider.  Haemophilus influenzae type b (Hib) vaccine.** / Consult your health care provider. Ages 64 years and over  Blood pressure check.** / Every year.  Lipid and cholesterol check.** / Every 5 years beginning at age 23 years.  Lung cancer screening. / Every year if you  are aged 16-80 years and have a 30-pack-year history of smoking and currently smoke or have quit within the past 15 years. Yearly screening is stopped once you have quit smoking for at least 15 years or develop a health problem that would prevent you from having lung cancer treatment.  Clinical breast exam.** / Every year after age 74 years.  BRCA-related cancer risk assessment.** / For women who have family members with a BRCA-related cancer (breast, ovarian, tubal, or peritoneal cancers).  Mammogram.** / Every year beginning at age 44 years and continuing for as long as you are in good health. Consult with your health care provider.  Pap test.** / Every 3 years starting at age 58 years through age 22 or 39 years with 3 consecutive normal Pap tests. Testing can be stopped between 65 and 70 years with 3 consecutive normal Pap tests and no abnormal Pap or HPV tests in the past 10 years.  HPV screening.** / Every 3 years from ages 64 years through ages 70 or 61 years with a history of 3 consecutive normal Pap tests. Testing can be stopped between 65 and 70 years with 3 consecutive normal Pap tests and no abnormal Pap or HPV tests in the past 10 years.  Fecal occult blood test (FOBT) of stool. / Every year beginning at age 40 years and continuing until age 27 years. You may not need to do this test if you get a colonoscopy every 10 years.  Flexible sigmoidoscopy or colonoscopy.** / Every 5 years for a flexible sigmoidoscopy or every 10 years for a colonoscopy beginning at age 7 years and continuing until age 32 years.  Hepatitis C blood test.** / For all people born from [birth year] through 1966 and any individual with known risks for hepatitis C.  Osteoporosis screening.** / A one-time screening for women ages 30 years and over and women at risk for fractures or osteoporosis.  Skin self-exam. / Monthly.  Influenza vaccine. / Every year.  Tetanus, diphtheria, and acellular pertussis (Tdap/Td)  vaccine.** / 1 dose of Td every 10 years.  Varicella vaccine.** / Consult your health care provider.  Zoster vaccine.** / 1 dose for adults aged 35 years or older.  Pneumococcal 13-valent conjugate (PCV13) vaccine.** / Consult your health care provider.  Pneumococcal polysaccharide (PPSV23) vaccine.** / 1 dose for all adults aged 46 years and older.  Meningococcal vaccine.** / Consult your health care provider.  Hepatitis A vaccine.** / Consult your health care provider.  Hepatitis B vaccine.** / Consult your health care provider.  Haemophilus influenzae type b (Hib) vaccine.** / Consult your health care provider. ** Family history and personal history of risk and conditions may change your health care provider's recommendations.   This information is not intended to replace advice given to you by your health care provider. Make sure you discuss any questions you have with your health care provider.   Document Released: 06/05/2001 Document Revised: 04/30/2014 Document Reviewed: 09/04/2010 Elsevier Interactive Patient Education [employer].

## 2016-01-10 NOTE — Progress Notes (Signed)
Pre visit review using our clinic review tool, if applicable. No additional management support is needed unless otherwise documented below in the visit note. 

## 2016-01-11 LAB — COMPREHENSIVE METABOLIC PANEL
ALT: 17 U/L (ref 0–35)
AST: 18 U/L (ref 0–37)
Albumin: 4 g/dL (ref 3.5–5.2)
Alkaline Phosphatase: 79 U/L (ref 39–117)
BUN: 28 mg/dL — ABNORMAL HIGH (ref 6–23)
CO2: 32 mEq/L (ref 19–32)
Calcium: 9.4 mg/dL (ref 8.4–10.5)
Chloride: 102 mEq/L (ref 96–112)
Creatinine, Ser: 1.13 mg/dL (ref 0.40–1.20)
GFR: 63.99 mL/min (ref >60.00)
Glucose, Bld: 106 mg/dL — ABNORMAL HIGH (ref 70–99)
Potassium: 4.3 mEq/L (ref 3.5–5.1)
Sodium: 139 mEq/L (ref 135–145)
Total Bilirubin: 0.5 mg/dL (ref 0.2–1.2)
Total Protein: 6.9 g/dL (ref 6.0–8.3)

## 2016-01-11 LAB — CBC WITH DIFFERENTIAL/PLATELET
Basophils Absolute: 0 10*3/uL (ref 0.0–0.1)
Basophils Relative: 0.3 % (ref 0.0–3.0)
Eosinophils Absolute: 0.1 10*3/uL (ref 0.0–0.7)
Eosinophils Relative: 2.2 % (ref 0.0–5.0)
HCT: 41.9 % (ref 36.0–46.0)
Hemoglobin: 14.5 g/dL (ref 12.0–15.0)
Lymphocytes Relative: 44.4 % (ref 12.0–46.0)
Lymphs Abs: 2.5 10*3/uL (ref 0.7–4.0)
MCHC: 34.5 g/dL (ref 30.0–36.0)
MCV: 91.3 fl (ref 78.0–100.0)
Monocytes Absolute: 0.6 10*3/uL (ref 0.1–1.0)
Monocytes Relative: 9.6 % (ref 3.0–12.0)
Neutro Abs: 2.5 10*3/uL (ref 1.4–7.7)
Neutrophils Relative %: 43.5 % (ref 43.0–77.0)
Platelets: 187 10*3/uL (ref 150.0–400.0)
RBC: 4.59 Mil/uL (ref 3.87–5.11)
RDW: 13.6 % (ref 11.5–15.5)
WBC: 5.7 10*3/uL (ref 4.0–10.5)

## 2016-01-11 LAB — LIPID PANEL
Cholesterol: 187 mg/dL (ref 0–200)
HDL: 69.4 mg/dL (ref >39.00)
LDL Cholesterol: 100 mg/dL — ABNORMAL HIGH (ref 0–99)
NonHDL: 117.32
Total CHOL/HDL Ratio: 3
Triglycerides: 89 mg/dL (ref 0.0–149.0)
VLDL: 17.8 mg/dL (ref 0.0–40.0)

## 2016-01-11 LAB — TSH: TSH: 1.68 u[IU]/mL (ref 0.35–4.50)

## 2016-01-11 LAB — HEMOGLOBIN A1C: Hgb A1c MFr Bld: 6.2 % (ref 4.6–6.5)

## 2016-01-11 MED ORDER — ALPRAZOLAM 0.5 MG PO TABS: 0.5000 mg | ORAL_TABLET | Freq: Every evening | ORAL | 0 refills | Status: DC | PRN

## 2016-01-11 MED ORDER — AMLODIPINE-OLMESARTAN 5-20 MG PO TABS: 1.0000 | ORAL_TABLET | Freq: Every day | ORAL | 5 refills | Status: DC

## 2016-03-13 ENCOUNTER — Ambulatory Visit (INDEPENDENT_AMBULATORY_CARE_PROVIDER_SITE_OTHER): Payer: BLUE CROSS/BLUE SHIELD | Admitting: Family Medicine

## 2016-03-13 ENCOUNTER — Encounter: Payer: Self-pay | Admitting: Family Medicine

## 2016-03-13 VITALS — BP 130/78 | HR 69 | Temp 97.9°F | Resp 17 | Ht 67.0 in | Wt 195.0 lb

## 2016-03-13 DIAGNOSIS — T23262A Burn of second degree of back of left hand, initial encounter: Secondary | ICD-10-CM

## 2016-03-13 MED ORDER — SILVER SULFADIAZINE 1 % EX CREA: 1.0000 "application " | TOPICAL_CREAM | Freq: Every day | CUTANEOUS | 0 refills | Status: DC

## 2016-03-13 NOTE — Patient Instructions (Signed)
Burn Care, Adult A burn is an injury to the skin or the tissues under the skin. There are three types of burns:  First degree. These burns may cause the skin to be red and slightly swollen.  Second degree. These burns are very painful and cause the skin to be very red. The skin may also leak fluid, look shiny, and develop blisters.  Third degree. These burns cause permanent damage. They either turn the skin white or black and make it look charred, dry, and leathery. Taking care of your burn properly can help to prevent pain and infection. It can also help the burn to heal more quickly. What are the risks? Complications from burns include:  Damage to the skin.  Reduced blood flow near the injury.  Dead tissue.  Scarring.  Problems with movement, if the burn happened near a joint or on the hands or feet. Severe burns can lead to problems that affect the whole body, such as:  Fluid loss.  Less blood circulating in the body.  Inability to maintain a normal core body temperature (thermoregulation).  Infection.  Shock.  Problems breathing. How to care for a first-degree burn Right after a burn:   Rinse or soak the burn under cool water until the pain stops. Do not put ice on your burn. This can cause more damage.  Lightly cover the burn with a sterile cloth (dressing). Burn care   Follow instructions from your health care provider about:  How to clean and take care of the burn.  When to change and remove the dressing.  Check your burn every day for signs of infection. Check for:  More redness, swelling, or pain.  Warmth.  Pus or a bad smell. Medicine   Take over-the-counter and prescription medicines only as told by your health care provider.  If you were prescribed antibiotic medicine, take or apply it as told by your health care provider. Do not stop using the antibiotic even if your condition improves. General instructions   To prevent infection, do not put  butter, oil, or other home remedies on your burn.  Do not rub your burn, even when you are cleaning it.  Protect your burn from the sun. How to care for a second-degree burn Right after a burn:   Rinse or soak the burn under cool water. Do this for several minutes. Do not put ice on your burn. This can cause more damage.  Lightly cover the burn with a sterile cloth (dressing). Burn care   Raise (elevate) the injured area above the level of your heart while sitting or lying down.  Follow instructions from your health care provider about:  How to clean and take care of the burn.  When to change and remove the dressing.  Check your burn every day for signs of infection. Check for:  More redness, swelling, or pain.  Warmth.  Pus or a bad smell. Medicine    Take over-the-counter and prescription medicines only as told by your health care provider.  If you were prescribed antibiotic medicine, take or apply it as told by your health care provider. Do not stop using the antibiotic even if your condition improves. General instructions   To prevent infection:  Do not put butter, oil, or other home remedies on the burn.  Do not scratch or pick at the burn.  Do not break any blisters.  Do not peel skin.  Do not rub your burn, even when you are cleaning it.    Protect your burn from the sun. How to care for a third-degree burn Right after a burn:   Lightly cover the burn with gauze.  Seek immediate medical attention. Burn care   Raise (elevate) the injured area above the level of your heart while sitting or lying down.  Drink enough fluid to keep your urine clear or pale yellow.  Rest as told by your health care provider. Do not participate in sports or other physical activities until your health care provider approves.  Follow instructions from your health care provider about:  How to clean and take care of the burn.  When to change and remove the  dressing.  Check your burn every day for signs of infection. Check for:  More redness, swelling, or pain.  Warmth.  Pus or a bad smell. Medicine   Take over-the-counter and prescription medicines only as told by your health care provider.  If you were prescribed antibiotic medicine, take or apply it as told by your health care provider. Do not stop using the antibiotic even if your condition improves. General instructions   To prevent infection:  Do not put butter, oil, or other home remedies on the burn.  Do not scratch or pick at the burn.  Do not break any blisters.  Do not peel skin.  Do not rub your burn, even when you are cleaning it.  Protect your burn from the sun.  Keep all follow-up visits as told by your health care provider. This is important. Contact a health care provider if:  Your condition does not improve.  Your condition gets worse.  You have a fever.  Your burn changes in appearance or develops black or red spots.  Your burn feels warm to the touch.  Your pain is not controlled with medicine. Get help right away if:  You have redness, swelling, or pain at the site of the burn.  You have fluid, blood, or pus coming from your burn.  You have red streaks near the burn.  You have severe pain. This information is not intended to replace advice given to you by your health care provider. Make sure you discuss any questions you have with your health care provider. Document Released: 04/09/2005 Document Revised: 10/30/2015 Document Reviewed: 09/27/2015 Elsevier Interactive Patient Education  2017 Elsevier Inc.  

## 2016-03-13 NOTE — Progress Notes (Signed)
Pre visit review using our clinic review tool, if applicable. No additional management support is needed unless otherwise documented below in the visit note. 
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
## 2016-06-03 ENCOUNTER — Other Ambulatory Visit: Payer: Self-pay | Admitting: Family Medicine

## 2016-06-03 DIAGNOSIS — I10 Essential (primary) hypertension: Secondary | ICD-10-CM

## 2016-07-14 ENCOUNTER — Ambulatory Visit (INDEPENDENT_AMBULATORY_CARE_PROVIDER_SITE_OTHER): Payer: BLUE CROSS/BLUE SHIELD | Admitting: Family Medicine

## 2016-07-14 ENCOUNTER — Encounter: Payer: Self-pay | Admitting: Family Medicine

## 2016-07-14 VITALS — BP 134/96 | HR 58 | Temp 98.7°F | Resp 16 | Wt 190.0 lb

## 2016-07-14 DIAGNOSIS — R350 Frequency of micturition: Secondary | ICD-10-CM

## 2016-07-14 LAB — POCT URINALYSIS DIPSTICK
Bilirubin, UA: NEGATIVE
Blood, UA: NEGATIVE
Glucose, UA: NEGATIVE
Ketones, UA: NEGATIVE
Leukocytes, UA: NEGATIVE
Nitrite, UA: NEGATIVE
Spec Grav, UA: 1.015 (ref 1.030–1.035)
Urobilinogen, UA: 0.2 (ref ?–2.0)
pH, UA: 7 (ref 5.0–8.0)

## 2016-07-14 NOTE — Progress Notes (Signed)
Subjective:     Patient ID: Angela Garner, female   DOB: 11-20-1960, 56 y.o.   MRN: [id number]  HPI Patient is seen in Saturday clinic with several month hx of urine frequency/urgency.  She has some mild burning earlier in week but none now.  She was placed by her gyn on Ditropan but had dry mouth and did not tolerate.  She has prediabetes and was concerned her blood sugar was causing her frequency.  However, fasting sugars generally running < 120.  No diuretic use.  No ETOH.  Minimal caffeine.  Frequent nocturia.    Past Medical History:  Diagnosis Date  . Allergy   . Diabetes mellitus without complication (HCC)    no meds, only check bs  . Heart murmur   . Hyperlipidemia   . Hypertension    Past Surgical History:  Procedure Laterality Date  . UTERINE FIBROID SURGERY      reports that she has never smoked. She has never used smokeless tobacco. She reports that she does not drink alcohol or use drugs. family history includes Clotting disorder in her brother; Dementia in her mother; Diabetes in her father; Heart disease in her father; Hypertension in her father. Allergies  Allergen Reactions  . Penicillins     Unsure of reaction  . Pravachol [Pravastatin Sodium] Other (See Comments)    Muscle aches     Review of Systems  Constitutional: Negative for chills and fever.  Genitourinary: Positive for frequency and urgency. Negative for difficulty urinating, hematuria and vaginal discharge.       Objective:   Physical Exam  Constitutional: She appears well-developed and well-nourished.  Cardiovascular: Normal rate and regular rhythm.   Pulmonary/Chest: Effort normal and breath sounds normal. No respiratory distress. She has no wheezes. She has no rales.       Assessment:     Urine frequency/urgency.  Urine dip shows no glucose, blood, leukocytes, or nitrites.      Plan:     -continue to minimize caffeine. -We discussed other possible medication options for urgency (that would  not cause dry mouth) such as Myrbetriq and she is not interested at this time.  Eulas Post MD [facility]

## 2016-07-14 NOTE — Progress Notes (Signed)
Pre visit review using our clinic review tool, if applicable. No additional management support is needed unless otherwise documented below in the visit note. 

## 2016-07-14 NOTE — Patient Instructions (Signed)
Your urine does not show any infection Minimize caffeine use Avoid heavy fluid intake within 3-4 hours of bedtime There are other medications that might help your urine urgency that would not cause dry mouth such as Myrbetriq.

## 2016-11-16 ENCOUNTER — Ambulatory Visit (INDEPENDENT_AMBULATORY_CARE_PROVIDER_SITE_OTHER): Payer: BLUE CROSS/BLUE SHIELD | Admitting: Family Medicine

## 2016-11-16 ENCOUNTER — Ambulatory Visit (HOSPITAL_BASED_OUTPATIENT_CLINIC_OR_DEPARTMENT_OTHER)
Admission: RE | Admit: 2016-11-16 | Discharge: 2016-11-16 | Disposition: A | Discharge status: Home / Self Care | Payer: BLUE CROSS/BLUE SHIELD | Source: Ambulatory Visit | Attending: Family Medicine | Admitting: Family Medicine

## 2016-11-16 ENCOUNTER — Encounter: Payer: Self-pay | Admitting: Family Medicine

## 2016-11-16 VITALS — BP 116/78 | HR 66 | Temp 98.3°F | Resp 16 | Ht 67.0 in | Wt 188.4 lb

## 2016-11-16 DIAGNOSIS — M25552 Pain in left hip: Secondary | ICD-10-CM | POA: Diagnosis not present

## 2016-11-16 DIAGNOSIS — I10 Essential (primary) hypertension: Secondary | ICD-10-CM

## 2016-11-16 DIAGNOSIS — M545 Low back pain, unspecified: Secondary | ICD-10-CM

## 2016-11-16 DIAGNOSIS — E1151 Type 2 diabetes mellitus with diabetic peripheral angiopathy without gangrene: Secondary | ICD-10-CM | POA: Diagnosis not present

## 2016-11-16 DIAGNOSIS — M5136 Other intervertebral disc degeneration, lumbar region: Secondary | ICD-10-CM | POA: Insufficient documentation

## 2016-11-16 DIAGNOSIS — R1030 Lower abdominal pain, unspecified: Secondary | ICD-10-CM

## 2016-11-16 LAB — COMPREHENSIVE METABOLIC PANEL
ALT: 27 U/L (ref 0–35)
AST: 27 U/L (ref 0–37)
Albumin: 4.3 g/dL (ref 3.5–5.2)
Alkaline Phosphatase: 72 U/L (ref 39–117)
BUN: 12 mg/dL (ref 6–23)
CO2: 27 mEq/L (ref 19–32)
Calcium: 9.5 mg/dL (ref 8.4–10.5)
Chloride: 106 mEq/L (ref 96–112)
Creatinine, Ser: 0.8 mg/dL (ref 0.40–1.20)
GFR: 95.04 mL/min (ref >60.00)
Glucose, Bld: 116 mg/dL — ABNORMAL HIGH (ref 70–99)
Potassium: 3.4 mEq/L — ABNORMAL LOW (ref 3.5–5.1)
Sodium: 142 mEq/L (ref 135–145)
Total Bilirubin: 0.8 mg/dL (ref 0.2–1.2)
Total Protein: 7.4 g/dL (ref 6.0–8.3)

## 2016-11-16 LAB — CBC WITH DIFFERENTIAL/PLATELET
Basophils Absolute: 0 10*3/uL (ref 0.0–0.1)
Basophils Relative: 1 % (ref 0.0–3.0)
Eosinophils Absolute: 0.1 10*3/uL (ref 0.0–0.7)
Eosinophils Relative: 3.1 % (ref 0.0–5.0)
HCT: 45.1 % (ref 36.0–46.0)
Hemoglobin: 15.2 g/dL — ABNORMAL HIGH (ref 12.0–15.0)
Lymphocytes Relative: 54.5 % — ABNORMAL HIGH (ref 12.0–46.0)
Lymphs Abs: 2 10*3/uL (ref 0.7–4.0)
MCHC: 33.8 g/dL (ref 30.0–36.0)
MCV: 94.4 fl (ref 78.0–100.0)
Monocytes Absolute: 0.4 10*3/uL (ref 0.1–1.0)
Monocytes Relative: 10.3 % (ref 3.0–12.0)
Neutro Abs: 1.1 10*3/uL — ABNORMAL LOW (ref 1.4–7.7)
Neutrophils Relative %: 31.1 % — ABNORMAL LOW (ref 43.0–77.0)
Platelets: 167 10*3/uL (ref 150.0–400.0)
RBC: 4.77 Mil/uL (ref 3.87–5.11)
RDW: 13.3 % (ref 11.5–15.5)
WBC: 3.7 10*3/uL — ABNORMAL LOW (ref 4.0–10.5)

## 2016-11-16 LAB — LIPID PANEL
Cholesterol: 187 mg/dL (ref 0–200)
HDL: 76.5 mg/dL (ref >39.00)
LDL Cholesterol: 100 mg/dL — ABNORMAL HIGH (ref 0–99)
NonHDL: 110.12
Total CHOL/HDL Ratio: 2
Triglycerides: 52 mg/dL (ref 0.0–149.0)
VLDL: 10.4 mg/dL (ref 0.0–40.0)

## 2016-11-16 LAB — POC URINALSYSI DIPSTICK (AUTOMATED)
Bilirubin, UA: NEGATIVE
Blood, UA: NEGATIVE
Glucose, UA: NEGATIVE
Ketones, UA: NEGATIVE
Leukocytes, UA: NEGATIVE
Nitrite, UA: NEGATIVE
Protein, UA: NEGATIVE
Spec Grav, UA: 1.02 (ref 1.010–1.025)
Urobilinogen, UA: 0.2 E.U./dL
pH, UA: 6 (ref 5.0–8.0)

## 2016-11-16 LAB — HEMOGLOBIN A1C: Hgb A1c MFr Bld: 6.4 % (ref 4.6–6.5)

## 2016-11-16 MED ORDER — OLMESARTAN MEDOXOMIL 20 MG PO TABS: 20.0000 mg | ORAL_TABLET | Freq: Every day | ORAL | 2 refills | Status: DC

## 2016-11-16 NOTE — Progress Notes (Signed)
Patient ID: Angela Garner, female   DOB: 1961-02-13, 56 y.o.   MRN: [id number]     Subjective:  I acted as a Education administrator for Dr. Carollee Herter.  [name], [place]   Patient ID: Angela Garner, female    DOB: 11/23/1960, 56 y.o.   MRN: [id number]  Chief Complaint  Patient presents with  . Back Pain    Back Pain  This is a new problem. Episode onset: 2-3 months. The problem occurs intermittently. The pain is present in the lumbar spine. Radiates to: to LLQ. The symptoms are aggravated by standing. Pertinent negatives include no abdominal pain, bladder incontinence, bowel incontinence, chest pain, dysuria, leg pain, numbness, paresis, paresthesias, tingling or weakness.    Patient is in today for back pain.  Patient Care Team: Carollee Herter, Alferd Apa, DO as PCP - General   Past Medical History:  Diagnosis Date  . Allergy   . Diabetes mellitus without complication (HCC)    no meds, only check bs  . Heart murmur   . Hyperlipidemia   . Hypertension     Past Surgical History:  Procedure Laterality Date  . UTERINE FIBROID SURGERY      Family History  Problem Relation Age of Onset  . Hypertension Father   . Heart disease Father   . Diabetes Father   . Colon cancer Neg Hx   . Esophageal cancer Neg Hx   . Stomach cancer Neg Hx   . Rectal cancer Neg Hx   . Dementia Mother   . Clotting disorder Brother     Social History   Social History  . Marital status: Married    Spouse name: N/A  . Number of children: N/A  . Years of education: N/A   Occupational History  . Not on file.   Social History Main Topics  . Smoking status: Never Smoker  . Smokeless tobacco: Never Used  . Alcohol use No  . Drug use: No  . Sexual activity: Yes   Other Topics Concern  . Not on file   Social History Narrative  . No narrative on file    Outpatient Medications Prior to Visit  Medication Sig Dispense Refill  . glucose blood (ONE TOUCH TEST STRIPS) test strip Test Blood sugar twice a day 100 each 2    . multivitamin-iron-minerals-folic acid (CENTRUM) chewable tablet Chew 1 tablet by mouth daily.    Glory Rosebush DELICA LANCETS FINE MISC 1 each by Does not apply route daily. 100 each 2  . amLODipine-olmesartan (AZOR) 5-20 MG tablet TAKE ONE TABLET BY MOUTH ONCE DAILY 30 tablet 5  . ALPRAZolam (XANAX) 0.5 MG tablet Take 1 tablet (0.5 mg total) by mouth at bedtime as needed for anxiety. 30 tablet 0  . co-enzyme Q-10 30 MG capsule Take 30 mg by mouth daily.    Marland Kitchen darifenacin (ENABLEX) 15 MG 24 hr tablet Take 1 tablet (15 mg total) by mouth daily. 30 tablet 5  . Fluticasone-Salmeterol (ADVAIR DISKUS) 250-50 MCG/DOSE AEPB Inhale 1 puff into the lungs 2 (two) times daily. 1 each 3  . methocarbamol (ROBAXIN) 500 MG tablet Take 1 tablet (500 mg total) by mouth 4 (four) times daily. 30 tablet 1  . oxybutynin (DITROPAN) 5 MG tablet Take 5 mg by mouth 3 (three) times daily.    . promethazine-dextromethorphan (PROMETHAZINE-DM) 6.25-15 MG/5ML syrup Take 5 mLs by mouth 4 (four) times daily as needed. 118 mL 0  . silver sulfADIAZINE (SILVADENE) 1 % cream Apply 1 application topically  daily. 50 g 0   No facility-administered medications prior to visit.     Allergies  Allergen Reactions  . Penicillins     Unsure of reaction  . Pravachol [Pravastatin Sodium] Other (See Comments)    Muscle aches    Review of Systems  Cardiovascular: Negative for chest pain.  Gastrointestinal: Negative for abdominal pain and bowel incontinence.  Genitourinary: Negative for bladder incontinence and dysuria.  Musculoskeletal: Positive for back pain.  Neurological: Negative for tingling, weakness, numbness and paresthesias.       Objective:    Physical Exam  Constitutional: She is oriented to person, place, and time. She appears well-developed and well-nourished.  HENT:  Head: Normocephalic and atraumatic.  Eyes: Conjunctivae and EOM are normal.  Neck: Normal range of motion. Neck supple. No JVD present. Carotid  bruit is not present. No thyromegaly present.  Cardiovascular: Normal rate, regular rhythm and normal heart sounds.   No murmur heard. Pulmonary/Chest: Effort normal and breath sounds normal. No respiratory distress. She has no wheezes. She has no rales. She exhibits no tenderness.  Abdominal: There is tenderness in the suprapubic area and left lower quadrant. There is no rigidity, no rebound, no guarding, no tenderness at McBurney's point and negative Murphy's sign.  Musculoskeletal: She exhibits tenderness. She exhibits no edema.  Neurological: She is alert and oriented to person, place, and time.  Psychiatric: She has a normal mood and affect.  Nursing note and vitals reviewed.   BP 116/78 (BP Location: Left Arm, Cuff Size: Normal)   Pulse 66   Temp 98.3 F (36.8 C) (Oral)   Resp 16   Ht 5\' 7"  (1.702 m)   Wt 188 lb 6.4 oz (85.5 kg)   SpO2 98%   BMI 29.51 kg/m  Wt Readings from Last 3 Encounters:  11/16/16 188 lb 6.4 oz (85.5 kg)  07/14/16 190 lb (86.2 kg)  03/13/16 195 lb (88.5 kg)   BP Readings from Last 3 Encounters:  11/16/16 116/78  07/14/16 (!) 134/96  03/13/16 130/78     Immunization History  Administered Date(s) Administered  . Influenza, Seasonal, Injecte, Preservative Fre 05/19/2012  . Influenza,inj,Quad PF,36+ Mos 01/07/2013, 03/09/2014, 01/14/2015, 01/10/2016  . Pneumococcal Polysaccharide-23 01/07/2013    Health Maintenance  Topic Date Due  . OPHTHALMOLOGY EXAM  12/23/2015  . MAMMOGRAM  02/04/2016  . FOOT EXAM  06/02/2016  . INFLUENZA VACCINE  11/21/2016  . HEMOGLOBIN A1C  05/19/2017  . PNEUMOCOCCAL POLYSACCHARIDE VACCINE (2) 01/07/2018  . TETANUS/TDAP  02/03/2018  . PAP SMEAR  12/27/2018  . COLONOSCOPY  05/12/2023  . Hepatitis C Screening  Completed  . HIV Screening  Completed    Lab Results  Component Value Date   WBC 3.7 (L) 11/16/2016   HGB 15.2 (H) 11/16/2016   HCT 45.1 11/16/2016   PLT 167.0 11/16/2016   GLUCOSE 116 (H) 11/16/2016    CHOL 187 11/16/2016   TRIG 52.0 11/16/2016   HDL 76.50 11/16/2016   LDLDIRECT 120.9 08/02/2011   LDLCALC 100 (H) 11/16/2016   ALT 27 11/16/2016   AST 27 11/16/2016   NA 142 11/16/2016   K 3.4 (L) 11/16/2016   CL 106 11/16/2016   CREATININE 0.80 11/16/2016   BUN 12 11/16/2016   CO2 27 11/16/2016   TSH 1.68 01/10/2016   HGBA1C 6.4 11/16/2016   MICROALBUR 1.8 06/03/2015    Lab Results  Component Value Date   TSH 1.68 01/10/2016   Lab Results  Component Value Date   WBC 3.7 (  L) 11/16/2016   HGB 15.2 (H) 11/16/2016   HCT 45.1 11/16/2016   MCV 94.4 11/16/2016   PLT 167.0 11/16/2016   Lab Results  Component Value Date   NA 142 11/16/2016   K 3.4 (L) 11/16/2016   CO2 27 11/16/2016   GLUCOSE 116 (H) 11/16/2016   BUN 12 11/16/2016   CREATININE 0.80 11/16/2016   BILITOT 0.8 11/16/2016   ALKPHOS 72 11/16/2016   AST 27 11/16/2016   ALT 27 11/16/2016   PROT 7.4 11/16/2016   ALBUMIN 4.3 11/16/2016   CALCIUM 9.5 11/16/2016   GFR 95.04 11/16/2016   Lab Results  Component Value Date   CHOL 187 11/16/2016   Lab Results  Component Value Date   HDL 76.50 11/16/2016   Lab Results  Component Value Date   LDLCALC 100 (H) 11/16/2016   Lab Results  Component Value Date   TRIG 52.0 11/16/2016   Lab Results  Component Value Date   CHOLHDL 2 11/16/2016   Lab Results  Component Value Date   HGBA1C 6.4 11/16/2016         Assessment & Plan:   Problem List Items Addressed This Visit      Unprioritized   DM (diabetes mellitus) type II, controlled, with peripheral vascular disorder ([place])    Check labs today On no meds      Relevant Medications   olmesartan (BENICAR) 20 MG tablet   Essential hypertension - Primary    Well controlled, no changes to meds. Encouraged heart healthy diet such as the DASH diet and exercise as tolerated.        Relevant Medications   olmesartan (BENICAR) 20 MG tablet   Other Relevant Orders   CBC with Differential/Platelet  (Completed)   Lipid panel (Completed)   Hemoglobin A1c (Completed)   Comprehensive metabolic panel (Completed)   POCT Urinalysis Dipstick (Automated) (Completed)    Other Visit Diagnoses    Lower abdominal pain       Relevant Orders   CBC with Differential/Platelet (Completed)   Lipid panel (Completed)   Hemoglobin A1c (Completed)   Comprehensive metabolic panel (Completed)   POCT Urinalysis Dipstick (Automated) (Completed)   Left hip pain       Relevant Orders   DG HIP UNILAT WITH PELVIS 2-3 VIEWS LEFT (Completed)   Acute left-sided low back pain without sciatica       Relevant Orders   DG Lumbar Spine 2-3 Views (Completed)      I have discontinued [name]'s co-enzyme Q-10, methocarbamol, darifenacin, Fluticasone-Salmeterol, promethazine-dextromethorphan, oxybutynin, ALPRAZolam, silver sulfADIAZINE, and amLODipine-olmesartan. I am also having her start on olmesartan. Additionally, I am having her maintain her multivitamin-iron-minerals-folic acid, ONETOUCH DELICA LANCETS FINE, and glucose blood.  Meds ordered this encounter  Medications  . olmesartan (BENICAR) 20 MG tablet    Sig: Take 1 tablet (20 mg total) by mouth daily.    Dispense:  30 tablet    Refill:  2    CMA served as scribe during this visit. History, Physical and Plan performed by medical provider. Documentation and orders reviewed and attested to.  [name], DO

## 2016-11-16 NOTE — Patient Instructions (Signed)
Hip Pain The hip is the joint between the upper legs and the lower pelvis. The bones, cartilage, tendons, and muscles of your hip joint support your body and allow you to move around. Hip pain can range from a minor ache to severe pain in one or both of your hips. The pain may be felt on the inside of the hip joint near the groin, or the outside near the buttocks and upper thigh. You may also have swelling or stiffness. Follow these instructions at home: Managing pain, stiffness, and swelling   If directed, apply ice to the injured area.  Put ice in a plastic bag.  Place a towel between your skin and the bag.  Leave the ice on for 20 minutes, 2-3 times a day  Sleep with a pillow between your legs on your most comfortable side.  Avoid any activities that cause pain. General instructions   Take over-the-counter and prescription medicines only as told by your health care provider.  Do any exercises as told by your health care provider.  Record the following:  How often you have hip pain.  The location of your pain.  What the pain feels like.  What makes the pain worse.  Keep all follow-up visits as told by your health care provider. This is important. Contact a health care provider if:  You cannot put weight on your leg.  Your pain or swelling continues or gets worse after one week.  It gets harder to walk.  You have a fever. Get help right away if:  You fall.  You have a sudden increase in pain and swelling in your hip.  Your hip is red or swollen or very tender to touch. Summary  Hip pain can range from a minor ache to severe pain in one or both of your hips.  The pain may be felt on the inside of the hip joint near the groin, or the outside near the buttocks and upper thigh.  Avoid any activities that cause pain.  Record how often you have hip pain, the location of the pain, what makes it worse and what it feels like. This information is not intended to  replace advice given to you by your health care provider. Make sure you discuss any questions you have with your health care provider. Document Released: 09/27/2009 Document Revised: 03/12/2016 Document Reviewed: 03/12/2016 Elsevier Interactive Patient Education  2017 [employer] Inc[employer]  

## 2016-11-16 NOTE — Assessment & Plan Note (Signed)
Check labs today On no meds

## 2016-11-16 NOTE — Assessment & Plan Note (Signed)
Well controlled, no changes to meds. Encouraged heart healthy diet such as the DASH diet and exercise as tolerated.  °

## 2017-02-11 LAB — HM MAMMOGRAPHY

## 2017-02-21 ENCOUNTER — Ambulatory Visit (INDEPENDENT_AMBULATORY_CARE_PROVIDER_SITE_OTHER): Payer: BLUE CROSS/BLUE SHIELD | Admitting: Family Medicine

## 2017-02-21 ENCOUNTER — Encounter: Payer: Self-pay | Admitting: Family Medicine

## 2017-02-21 VITALS — BP 160/100 | HR 80 | Temp 97.7°F | Ht 68.0 in | Wt 190.0 lb

## 2017-02-21 DIAGNOSIS — I10 Essential (primary) hypertension: Secondary | ICD-10-CM | POA: Diagnosis not present

## 2017-02-21 DIAGNOSIS — E1151 Type 2 diabetes mellitus with diabetic peripheral angiopathy without gangrene: Secondary | ICD-10-CM

## 2017-02-21 DIAGNOSIS — E1165 Type 2 diabetes mellitus with hyperglycemia: Secondary | ICD-10-CM | POA: Diagnosis not present

## 2017-02-21 DIAGNOSIS — Z23 Encounter for immunization: Secondary | ICD-10-CM

## 2017-02-21 DIAGNOSIS — Z0000 Encounter for general adult medical examination without abnormal findings: Secondary | ICD-10-CM | POA: Diagnosis not present

## 2017-02-21 DIAGNOSIS — R350 Frequency of micturition: Secondary | ICD-10-CM

## 2017-02-21 LAB — COMPREHENSIVE METABOLIC PANEL
ALT: 20 U/L (ref 0–35)
AST: 21 U/L (ref 0–37)
Albumin: 4.2 g/dL (ref 3.5–5.2)
Alkaline Phosphatase: 65 U/L (ref 39–117)
BUN: 19 mg/dL (ref 6–23)
CO2: 32 mEq/L (ref 19–32)
Calcium: 9.6 mg/dL (ref 8.4–10.5)
Chloride: 107 mEq/L (ref 96–112)
Creatinine, Ser: 0.82 mg/dL (ref 0.40–1.20)
GFR: 92.28 mL/min (ref >60.00)
Glucose, Bld: 109 mg/dL — ABNORMAL HIGH (ref 70–99)
Potassium: 3.6 mEq/L (ref 3.5–5.1)
Sodium: 144 mEq/L (ref 135–145)
Total Bilirubin: 0.8 mg/dL (ref 0.2–1.2)
Total Protein: 7.1 g/dL (ref 6.0–8.3)

## 2017-02-21 LAB — CBC WITH DIFFERENTIAL/PLATELET
Basophils Absolute: 0 10*3/uL (ref 0.0–0.1)
Basophils Relative: 1.1 % (ref 0.0–3.0)
Eosinophils Absolute: 0.1 10*3/uL (ref 0.0–0.7)
Eosinophils Relative: 1.7 % (ref 0.0–5.0)
HCT: 44.4 % (ref 36.0–46.0)
Hemoglobin: 14.8 g/dL (ref 12.0–15.0)
Lymphocytes Relative: 49.7 % — ABNORMAL HIGH (ref 12.0–46.0)
Lymphs Abs: 1.9 10*3/uL (ref 0.7–4.0)
MCHC: 33.3 g/dL (ref 30.0–36.0)
MCV: 95.1 fl (ref 78.0–100.0)
Monocytes Absolute: 0.4 10*3/uL (ref 0.1–1.0)
Monocytes Relative: 11.2 % (ref 3.0–12.0)
Neutro Abs: 1.4 10*3/uL (ref 1.4–7.7)
Neutrophils Relative %: 36.3 % — ABNORMAL LOW (ref 43.0–77.0)
Platelets: 165 10*3/uL (ref 150.0–400.0)
RBC: 4.67 Mil/uL (ref 3.87–5.11)
RDW: 13.8 % (ref 11.5–15.5)
WBC: 3.8 10*3/uL — ABNORMAL LOW (ref 4.0–10.5)

## 2017-02-21 LAB — LIPID PANEL
Cholesterol: 191 mg/dL (ref 0–200)
HDL: 77.6 mg/dL (ref >39.00)
LDL Cholesterol: 103 mg/dL — ABNORMAL HIGH (ref 0–99)
NonHDL: 113.53
Total CHOL/HDL Ratio: 2
Triglycerides: 51 mg/dL (ref 0.0–149.0)
VLDL: 10.2 mg/dL (ref 0.0–40.0)

## 2017-02-21 LAB — POC URINALSYSI DIPSTICK (AUTOMATED)
Bilirubin, UA: NEGATIVE
Blood, UA: NEGATIVE
Glucose, UA: NEGATIVE
Ketones, UA: NEGATIVE
Leukocytes, UA: NEGATIVE
Nitrite, UA: NEGATIVE
Protein, UA: NEGATIVE
Spec Grav, UA: 1.01 (ref 1.010–1.025)
Urobilinogen, UA: 0.2 E.U./dL
pH, UA: 7.5 (ref 5.0–8.0)

## 2017-02-21 LAB — MICROALBUMIN / CREATININE URINE RATIO
Creatinine,U: 118.2 mg/dL
Microalb Creat Ratio: 1.6 mg/g (ref 0.0–30.0)
Microalb, Ur: 1.9 mg/dL (ref 0.0–1.9)

## 2017-02-21 LAB — HEMOGLOBIN A1C: Hgb A1c MFr Bld: 6.2 % (ref 4.6–6.5)

## 2017-02-21 LAB — TSH: TSH: 1.68 u[IU]/mL (ref 0.35–4.50)

## 2017-02-21 MED ORDER — OLMESARTAN MEDOXOMIL 40 MG PO TABS: 40.0000 mg | ORAL_TABLET | Freq: Every day | ORAL | 2 refills | Status: DC

## 2017-02-21 MED ORDER — MIRABEGRON ER 50 MG PO TB24: 50.0000 mg | ORAL_TABLET | Freq: Every day | ORAL | 2 refills | Status: DC

## 2017-02-21 NOTE — Patient Instructions (Signed)
Preventive Care 40-64 Years, Female Preventive care refers to lifestyle choices and visits with your health care provider that can promote health and wellness. What does preventive care include?  A yearly physical exam. This is also called an annual well check.  Dental exams once or twice a year.  Routine eye exams. Ask your health care provider how often you should have your eyes checked.  Personal lifestyle choices, including: ? Daily care of your teeth and gums. ? Regular physical activity. ? Eating a healthy diet. ? Avoiding tobacco and drug use. ? Limiting alcohol use. ? Practicing safe sex. ? Taking low-dose aspirin daily starting at age 58. ? Taking vitamin and mineral supplements as recommended by your health care provider. What happens during an annual well check? The services and screenings done by your health care provider during your annual well check will depend on your age, overall health, lifestyle risk factors, and family history of disease. Counseling Your health care provider may ask you questions about your:  Alcohol use.  Tobacco use.  Drug use.  Emotional well-being.  Home and relationship well-being.  Sexual activity.  Eating habits.  Work and work Statistician.  Method of birth control.  Menstrual cycle.  Pregnancy history.  Screening You may have the following tests or measurements:  Height, weight, and BMI.  Blood pressure.  Lipid and cholesterol levels. These may be checked every 5 years, or more frequently if you are over 81 years old.  Skin check.  Lung cancer screening. You may have this screening every year starting at age 78 if you have a 30-pack-year history of smoking and currently smoke or have quit within the past 15 years.  Fecal occult blood test (FOBT) of the stool. You may have this test every year starting at age 65.  Flexible sigmoidoscopy or colonoscopy. You may have a sigmoidoscopy every 5 years or a colonoscopy  every 10 years starting at age 30.  Hepatitis C blood test.  Hepatitis B blood test.  Sexually transmitted disease (STD) testing.  Diabetes screening. This is done by checking your blood sugar (glucose) after you have not eaten for a while (fasting). You may have this done every 1-3 years.  Mammogram. This may be done every 1-2 years. Talk to your health care provider about when you should start having regular mammograms. This may depend on whether you have a family history of breast cancer.  BRCA-related cancer screening. This may be done if you have a family history of breast, ovarian, tubal, or peritoneal cancers.  Pelvic exam and Pap test. This may be done every 3 years starting at age 80. Starting at age 36, this may be done every 5 years if you have a Pap test in combination with an HPV test.  Bone density scan. This is done to screen for osteoporosis. You may have this scan if you are at high risk for osteoporosis.  Discuss your test results, treatment options, and if necessary, the need for more tests with your health care provider. Vaccines Your health care provider may recommend certain vaccines, such as:  Influenza vaccine. This is recommended every year.  Tetanus, diphtheria, and acellular pertussis (Tdap, Td) vaccine. You may need a Td booster every 10 years.  Varicella vaccine. You may need this if you have not been vaccinated.  Zoster vaccine. You may need this after age 5.  Measles, mumps, and rubella (MMR) vaccine. You may need at least one dose of MMR if you were born in  1958 or later. You may also need a second dose.  Pneumococcal 13-valent conjugate (PCV13) vaccine. You may need this if you have certain conditions and were not previously vaccinated.  Pneumococcal polysaccharide (PPSV23) vaccine. You may need one or two doses if you smoke cigarettes or if you have certain conditions.  Meningococcal vaccine. You may need this if you have certain  conditions.  Hepatitis A vaccine. You may need this if you have certain conditions or if you travel or work in places where you may be exposed to hepatitis A.  Hepatitis B vaccine. You may need this if you have certain conditions or if you travel or work in places where you may be exposed to hepatitis B.  Haemophilus influenzae type b (Hib) vaccine. You may need this if you have certain conditions.  Talk to your health care provider about which screenings and vaccines you need and how often you need them. This information is not intended to replace advice given to you by your health care provider. Make sure you discuss any questions you have with your health care provider. Document Released: 05/06/2015 Document Revised: 12/28/2015 Document Reviewed: 02/08/2015 Elsevier Interactive Patient Education  2017 [employer].

## 2017-02-21 NOTE — Assessment & Plan Note (Signed)
Poorly controlled will alter medications, encouraged DASH diet, minimize caffeine and obtain adequate sleep. Report concerning symptoms and follow up as directed and as needed 

## 2017-02-21 NOTE — Progress Notes (Signed)
Subjective:     Angela Garner is a 56 y.o. female and is here for a comprehensive physical exam. The patient reports problems - frequent urination . She also c/o L low abd pain -- she mentioned this to gyn and she thought it was more muscular .    HPI HYPERTENSION   Blood pressure range-high   Chest pain- no      Dyspnea- no Lightheadedness- no   Edema- no  Other side effects - no   Medication compliance: good Low salt diet- yes   4040 DIABETES    Blood Sugar ranges-100-140 Polyuria- no New Visual problems- no  Hypoglycemic symptoms- no  Other side effects-no Medication compliance - good Last eye exam- 04/2016 Foot exam- today   HYPERLIPIDEMIA  Medication compliance- good RUQ pain- no  Muscle aches- no Other side effects-no       Social History   Social History  . Marital status: Married    Spouse name: N/A  . Number of children: N/A  . Years of education: N/A   Occupational History  . [employer]      [employer]   Social History Main Topics  . Smoking status: Never Smoker  . Smokeless tobacco: Never Used  . Alcohol use No  . Drug use: No  . Sexual activity: Yes   Other Topics Concern  . Not on file   Social History Narrative  . No narrative on file   Health Maintenance  Topic Date Due  . MAMMOGRAM  02/04/2016  . FOOT EXAM  06/02/2016  . INFLUENZA VACCINE  11/21/2016  . OPHTHALMOLOGY EXAM  05/09/2017  . HEMOGLOBIN A1C  05/19/2017  . PNEUMOCOCCAL POLYSACCHARIDE VACCINE (2) 01/07/2018  . TETANUS/TDAP  02/03/2018  . PAP SMEAR  12/27/2018  . COLONOSCOPY  05/12/2023  . Hepatitis C Screening  Completed  . HIV Screening  Completed    The following portions of the patient's history were reviewed and updated as appropriate:  She  has a past medical history of Allergy; Diabetes mellitus without complication ([place]); Heart murmur; Hyperlipidemia; and Hypertension. She  does not have any pertinent problems on file. She  has a past surgical history  that includes Uterine fibroid surgery. Her family history includes Clotting disorder in her brother; Dementia in her mother; Diabetes in her father; Heart disease in her father; Hypertension in her father. She  reports that she has never smoked. She has never used smokeless tobacco. She reports that she does not drink alcohol or use drugs. She has a current medication list which includes the following prescription(s): glucose blood, multivitamin-iron-minerals-folic acid, onetouch delica lancets fine, mirabegron er, and olmesartan. Current Outpatient Prescriptions on File Prior to Visit  Medication Sig Dispense Refill  . glucose blood (ONE TOUCH TEST STRIPS) test strip Test Blood sugar twice a day 100 each 2  . multivitamin-iron-minerals-folic acid (CENTRUM) chewable tablet Chew 1 tablet by mouth daily.    Glory Rosebush DELICA LANCETS FINE MISC 1 each by Does not apply route daily. 100 each 2   No current facility-administered medications on file prior to visit.    She is allergic to penicillins and pravachol [pravastatin sodium]..  Review of Systems Review of Systems  Constitutional: Negative for activity change, appetite change and fatigue.  HENT: Negative for hearing loss, congestion, tinnitus and ear discharge.  dentist q18m Eyes: Negative for visual disturbance (see optho q1y -- vision corrected to 20/20 with glasses).  Respiratory: Negative for cough, chest tightness and shortness of  breath.   Cardiovascular: Negative for chest pain, palpitations and leg swelling.  Gastrointestinal: Negative for abdominal pain, diarrhea, constipation and abdominal distention.  Genitourinary: Negative for urgency, frequency, decreased urine volume and difficulty urinating.  Musculoskeletal: Negative for back pain, arthralgias and gait problem.  Skin: Negative for color change, pallor and rash.  Neurological: Negative for dizziness, light-headedness, numbness and headaches.  Hematological: Negative for  adenopathy. Does not bruise/bleed easily.  Psychiatric/Behavioral: Negative for suicidal ideas, confusion, sleep disturbance, self-injury, dysphoric mood, decreased concentration and agitation.       Objective:    BP (!) 160/100   Pulse 80   Temp 97.7 F (36.5 C) (Oral)   Ht 5\' 8"  (1.727 m)   Wt 190 lb (86.2 kg)   SpO2 98%   BMI 28.89 kg/m  General appearance: alert, cooperative, appears stated age and no distress Head: Normocephalic, without obvious abnormality, atraumatic Eyes: conjunctivae/corneas clear. PERRL, EOM's intact. Fundi benign. Ears: normal TM's and external ear canals both ears Nose: Nares normal. Septum midline. Mucosa normal. No drainage or sinus tenderness. Throat: lips, mucosa, and tongue normal; teeth and gums normal Neck: no adenopathy, no carotid bruit, no JVD, supple, symmetrical, trachea midline and thyroid not enlarged, symmetric, no tenderness/mass/nodules Back: symmetric, no curvature. ROM normal. No CVA tenderness. Lungs: clear to auscultation bilaterally Breasts: gyn Heart: regular rate and rhythm, S1, S2 normal, no murmur, click, rub or gallop Abdomen: soft, non-tender; bowel sounds normal; no masses,  no organomegaly Pelvic: deferred--gyn Extremities: extremities normal, atraumatic, no cyanosis or edema Pulses: 2+ and symmetric Skin: Skin color, texture, turgor normal. No rashes or lesions Lymph nodes: Cervical, supraclavicular, and axillary nodes normal. Neurologic: Alert and oriented X 3, normal strength and tone. Normal symmetric reflexes. Normal coordination and gait    Assessment:    Healthy female exam.      Plan:    ghm utd Check labs  Pt wants to wait on shingles Flu shot given  See After Visit Summary for Counseling Recommendations    1. Need for immunization against influenza - Flu Vaccine QUAD 6+ mos IM (Fluarix)  2. Frequent urination  - CULTURE, URINE COMPREHENSIVE - POCT Urinalysis Dipstick - mirabegron ER  (MYRBETRIQ) 50 MG TB24 tablet; Take 1 tablet (50 mg total) by mouth daily.  Dispense: 30 tablet; Refill: 2  3. Essential hypertension Poorly controlled will alter medications, encouraged DASH diet, minimize caffeine and obtain adequate sleep. Report concerning symptoms and follow up as directed and as needed - olmesartan (BENICAR) 40 MG tablet; Take 1 tablet (40 mg total) by mouth daily.  Dispense: 30 tablet; Refill: 2 - CBC with Differential/Platelet - Lipid panel - Hemoglobin A1c - TSH - Comprehensive metabolic panel - Microalbumin / creatinine urine ratio - POCT Urinalysis Dipstick (Automated)  4. Preventative health care See above - CBC with Differential/Platelet - Lipid panel - Hemoglobin A1c - TSH - Comprehensive metabolic panel - Microalbumin / creatinine urine ratio - POCT Urinalysis Dipstick (Automated)  5. Type 2 diabetes mellitus with hyperglycemia, without long-term current use of insulin (HCC) Check labs Sugars are running high per pt - CBC with Differential/Platelet - Lipid panel - Hemoglobin A1c - TSH - Comprehensive metabolic panel - Microalbumin / creatinine urine ratio - POCT Urinalysis Dipstick (Automated)  6. HYPERTENSION, BENIGN ESSENTIAL   7. DM (diabetes mellitus) type II, controlled, with peripheral vascular disorder ([place])

## 2017-02-21 NOTE — Assessment & Plan Note (Signed)
hgba1c to be done, minimize simple carbs. Increase exercise as tolerated. Continue current meds  

## 2017-02-23 LAB — CULTURE, URINE COMPREHENSIVE
MICRO NUMBER:: 81227144
SPECIMEN QUALITY:: ADEQUATE

## 2017-03-01 ENCOUNTER — Encounter: Payer: Self-pay | Admitting: *Deleted

## 2017-03-01 ENCOUNTER — Telehealth: Payer: Self-pay | Admitting: *Deleted

## 2017-03-01 NOTE — Telephone Encounter (Signed)
-----   Message from [name], DO sent at 02/21/2017 10:33 AM EDT ----- Please send LS spine xray to Dr Arletta Bale

## 2017-03-01 NOTE — Telephone Encounter (Signed)
Lumbar spine faxed to Dr. Benjie Karvonen

## 2017-05-04 ENCOUNTER — Encounter: Payer: Self-pay | Admitting: Internal Medicine

## 2017-05-04 ENCOUNTER — Ambulatory Visit: Payer: BLUE CROSS/BLUE SHIELD | Admitting: Internal Medicine

## 2017-05-04 VITALS — BP 160/100 | HR 76 | Temp 98.0°F | Resp 16 | Ht 68.0 in | Wt 190.0 lb

## 2017-05-04 DIAGNOSIS — M545 Low back pain, unspecified: Secondary | ICD-10-CM

## 2017-05-04 DIAGNOSIS — M25552 Pain in left hip: Secondary | ICD-10-CM

## 2017-05-04 MED ORDER — PREDNISONE 10 MG PO TABS: ORAL_TABLET | ORAL | 0 refills | Status: DC

## 2017-05-04 MED ORDER — METHOCARBAMOL 500 MG PO TABS: 500.0000 mg | ORAL_TABLET | Freq: Every evening | ORAL | 0 refills | Status: DC | PRN

## 2017-05-04 NOTE — Patient Instructions (Signed)

## 2017-05-04 NOTE — Progress Notes (Signed)
Subjective:    Patient ID: Angela Garner, female    DOB: [birth year], 57 y.o.   MRN: [id number]  HPI  Pt presents to the clinic today with c/o left lower back pain and left hip pain. This started a few months ago, but got worse 3 days ago. She describes the pain as sharp and stabbing. She denies numbness or tingling in her legs. She denies any issues with her bowel or bladder. She has taken Advil without any relief. She denies any injury to the area. She had a normal xray of her left hip 10/2016- see results in Epic  Review of Systems      Past Medical History:  Diagnosis Date  . Allergy   . Diabetes mellitus without complication (HCC)    no meds, only check bs  . Heart murmur   . Hyperlipidemia   . Hypertension     Current Outpatient Medications  Medication Sig Dispense Refill  . glucose blood (ONE TOUCH TEST STRIPS) test strip Test Blood sugar twice a day 100 each 2  . mirabegron ER (MYRBETRIQ) 50 MG TB24 tablet Take 1 tablet (50 mg total) by mouth daily. 30 tablet 2  . multivitamin-iron-minerals-folic acid (CENTRUM) chewable tablet Chew 1 tablet by mouth daily.    Marland Kitchen olmesartan (BENICAR) 40 MG tablet Take 1 tablet (40 mg total) by mouth daily. 30 tablet 2  . ONETOUCH DELICA LANCETS FINE MISC 1 each by Does not apply route daily. 100 each 2   No current facility-administered medications for this visit.     Allergies  Allergen Reactions  . Penicillins     Unsure of reaction  . Pravachol [Pravastatin Sodium] Other (See Comments)    Muscle aches    Family History  Problem Relation Age of Onset  . Hypertension Father   . Heart disease Father   . Diabetes Father   . Dementia Mother   . Clotting disorder Brother   . Colon cancer Neg Hx   . Esophageal cancer Neg Hx   . Stomach cancer Neg Hx   . Rectal cancer Neg Hx     Social History   Socioeconomic History  . Marital status: Married    Spouse name: Not on file  . Number of children: Not on file  . Years of  education: Not on file  . Highest education level: Not on file  Social Needs  . Financial resource strain: Not on file  . Food insecurity - worry: Not on file  . Food insecurity - inability: Not on file  . Transportation needs - medical: Not on file  . Transportation needs - non-medical: Not on file  Occupational History  . Occupation: quality [employer]     Comment: [employer]  Tobacco Use  . Smoking status: Never Smoker  . Smokeless tobacco: Never Used  Substance and Sexual Activity  . Alcohol use: No  . Drug use: No  . Sexual activity: Yes  Other Topics Concern  . Not on file  Social History Narrative  . Not on file     Constitutional: Denies fever, malaise, fatigue, headache or abrupt weight changes.  Gastrointestinal: Denies abdominal pain, bloating, constipation, diarrhea or blood in the stool.  GU: Denies urgency, frequency, pain with urination, burning sensation, blood in urine, odor or discharge. Musculoskeletal: Pt reports low back pain and left hip pain .Denies decrease in range of motion, difficulty with gait, or joint swelling.   No other specific complaints in a complete review  of systems (except as listed in HPI above).  Objective:   Physical Exam BP (!) 160/100 (BP Location: Left Arm, Patient Position: Sitting, Cuff Size: Normal)   Pulse 76   Temp 98 F (36.7 C) (Oral)   Resp 16   Ht 5\' 8"  (1.727 m)   Wt 190 lb (86.2 kg)   SpO2 97%   BMI 28.89 kg/m  Wt Readings from Last 3 Encounters:  05/04/17 190 lb (86.2 kg)  02/21/17 190 lb (86.2 kg)  11/16/16 188 lb 6.4 oz (85.5 kg)    General: Appears her stated age, in NAD. Skin: Warm, dry and intact. No rashes noted. Abdomen: Soft and nontender. Normal bowel sounds. No CVA tenderness noted. Musculoskeletal: Normal flexion, rotation and lateral bending of the spine. She has pain with extension. No bony tenderness noted over the spine of the left trochanter. Pain with palpation over the left SI Joint.  Strength 5/5 BLE. She can not walk on tiptoes. She is able to walk on her heels. Her normal gait is slowed and she is hunched over. Neurological: Alert and oriented. Negative SLR on the left.   BMET    Component Value Date/Time   NA 144 02/21/2017 1044   K 3.6 02/21/2017 1044   CL 107 02/21/2017 1044   CO2 32 02/21/2017 1044   GLUCOSE 109 (H) 02/21/2017 1044   GLUCOSE 128 (H) 04/25/2006 1209   BUN 19 02/21/2017 1044   CREATININE 0.82 02/21/2017 1044   CALCIUM 9.6 02/21/2017 1044   GFRNONAA 81 11/05/2007 1141   GFRAA 98 11/05/2007 1141    Lipid Panel     Component Value Date/Time   CHOL 191 02/21/2017 1044   TRIG 51.0 02/21/2017 1044   HDL 77.60 02/21/2017 1044   CHOLHDL 2 02/21/2017 1044   VLDL 10.2 02/21/2017 1044   LDLCALC 103 (H) 02/21/2017 1044    CBC    Component Value Date/Time   WBC 3.8 (L) 02/21/2017 1044   RBC 4.67 02/21/2017 1044   HGB 14.8 02/21/2017 1044   HGB 14.1 11/06/2011 1359   HCT 44.4 02/21/2017 1044   HCT 42.1 11/06/2011 1359   PLT 165.0 02/21/2017 1044   PLT 199 11/06/2011 1359   MCV 95.1 02/21/2017 1044   MCV 92.2 11/06/2011 1359   MCH 30.9 11/06/2011 1359   MCHC 33.3 02/21/2017 1044   RDW 13.8 02/21/2017 1044   RDW 13.3 11/06/2011 1359   LYMPHSABS 1.9 02/21/2017 1044   LYMPHSABS 3.0 11/06/2011 1359   MONOABS 0.4 02/21/2017 1044   MONOABS 0.6 11/06/2011 1359   EOSABS 0.1 02/21/2017 1044   EOSABS 0.1 11/06/2011 1359   BASOSABS 0.0 02/21/2017 1044   BASOSABS 0.0 11/06/2011 1359    Hgb A1C Lab Results  Component Value Date   HGBA1C 6.2 02/21/2017             Assessment & Plan:   Acute Low Back Pain, Left Hip Pain:  No need to repeat xray at this time May eventually need xray of lumbar spine if pain does not improve eRx for Pred Taper x 9 days eRx for Robaxin 500 mg QHS Encouraged her to try heat and/ice Stretching exercises given  RTC as needed or if symptoms persist or worsen BAITY, REGINA, NP

## 2017-05-06 ENCOUNTER — Telehealth: Payer: Self-pay | Admitting: *Deleted

## 2017-05-06 NOTE — Telephone Encounter (Signed)
Angela Garner from Dr. Benjie Karvonen over at [facility] stated that patient came in with LLQ pain.  Dr. Benjie Karvonen diagnosed her with sacroiliac pain gave her NSAIDs did urine which showed small amount of blood and strep in the culture. She started her on PCN.  She did [place]. She stated patient may need a possible CT.  Notes and [place] is on your desk for review.

## 2017-05-06 NOTE — Telephone Encounter (Signed)
If pain still severe tomorrow --- pt needs ov  If improveing can wait a few days --  But should be seen this week If pain worsens --- go to ER

## 2017-05-07 ENCOUNTER — Ambulatory Visit: Payer: Self-pay

## 2017-05-07 ENCOUNTER — Ambulatory Visit (HOSPITAL_BASED_OUTPATIENT_CLINIC_OR_DEPARTMENT_OTHER)
Admission: RE | Admit: 2017-05-07 | Discharge: 2017-05-07 | Disposition: A | Discharge status: Home / Self Care | Payer: BLUE CROSS/BLUE SHIELD | Source: Ambulatory Visit | Attending: Family Medicine | Admitting: Family Medicine

## 2017-05-07 ENCOUNTER — Encounter: Payer: Self-pay | Admitting: Family Medicine

## 2017-05-07 ENCOUNTER — Ambulatory Visit: Payer: BLUE CROSS/BLUE SHIELD | Admitting: Family Medicine

## 2017-05-07 ENCOUNTER — Other Ambulatory Visit: Payer: Self-pay

## 2017-05-07 VITALS — BP 130/90 | HR 88 | Temp 98.1°F | Ht 68.0 in | Wt 194.0 lb

## 2017-05-07 DIAGNOSIS — M5442 Lumbago with sciatica, left side: Secondary | ICD-10-CM | POA: Insufficient documentation

## 2017-05-07 DIAGNOSIS — M5136 Other intervertebral disc degeneration, lumbar region: Secondary | ICD-10-CM | POA: Diagnosis not present

## 2017-05-07 DIAGNOSIS — R109 Unspecified abdominal pain: Secondary | ICD-10-CM

## 2017-05-07 HISTORY — DX: Unspecified abdominal pain: R10.9

## 2017-05-07 MED ORDER — CYCLOBENZAPRINE HCL 10 MG PO TABS
10.0000 mg | ORAL_TABLET | Freq: Three times a day (TID) | ORAL | 0 refills | Status: DC | PRN
Start: 2017-05-07 — End: 2017-05-07

## 2017-05-07 MED ORDER — TRAMADOL HCL 50 MG PO TABS: 50.0000 mg | ORAL_TABLET | Freq: Three times a day (TID) | ORAL | 0 refills | Status: DC | PRN

## 2017-05-07 MED ORDER — CYCLOBENZAPRINE HCL 10 MG PO TABS: 10.0000 mg | ORAL_TABLET | Freq: Three times a day (TID) | ORAL | 0 refills | Status: DC | PRN

## 2017-05-07 NOTE — Telephone Encounter (Signed)
Patient scheduled for today

## 2017-05-07 NOTE — Progress Notes (Signed)
Subjective:  I acted as a Education administrator for [employer], [place]   Patient ID: Angela Garner, female    DOB: [birth year], 57 y.o.   MRN: [id number]  Chief Complaint  Patient presents with  . Back Pain  . Urinary Tract Infection    has been treated for UTI     HPI  Patient is in today for back pain.  She fell alseep on the couch Monday and back has been getting worse since.  Blood was seen in the urine and pain was moving around back to front.  She was put on abx and told she might need a ct scan to r/o stone.    Pt states pred and muscle relaxer given at sat clinic has not helped.     Patient Care Team: Carollee Herter, Alferd Apa, DO as PCP - General   Past Medical History:  Diagnosis Date  . Allergy   . Diabetes mellitus without complication (HCC)    no meds, only check bs  . Heart murmur   . Hyperlipidemia   . Hypertension     Past Surgical History:  Procedure Laterality Date  . UTERINE FIBROID SURGERY      Family History  Problem Relation Age of Onset  . Hypertension Father   . Heart disease Father   . Diabetes Father   . Dementia Mother   . Clotting disorder Brother   . Colon cancer Neg Hx   . Esophageal cancer Neg Hx   . Stomach cancer Neg Hx   . Rectal cancer Neg Hx     Social History   Socioeconomic History  . Marital status: Married    Spouse name: Not on file  . Number of children: Not on file  . Years of education: Not on file  . Highest education level: Not on file  Social Needs  . Financial resource strain: Not on file  . Food insecurity - worry: Not on file  . Food insecurity - inability: Not on file  . Transportation needs - medical: Not on file  . Transportation needs - non-medical: Not on file  Occupational History  . Occupation: [employer]     Comment: [employer]  Tobacco Use  . Smoking status: Never Smoker  . Smokeless tobacco: Never Used  Substance and Sexual Activity  . Alcohol use: No  . Drug use: No  . Sexual activity: Yes    Other Topics Concern  . Not on file  Social History Narrative  . Not on file    Outpatient Medications Prior to Visit  Medication Sig Dispense Refill  . glucose blood (ONE TOUCH TEST STRIPS) test strip Test Blood sugar twice a day 100 each 2  . methocarbamol (ROBAXIN) 500 MG tablet Take 1 tablet (500 mg total) by mouth at bedtime as needed for muscle spasms. 10 tablet 0  . mirabegron ER (MYRBETRIQ) 50 MG TB24 tablet Take 1 tablet (50 mg total) by mouth daily. 30 tablet 2  . multivitamin-iron-minerals-folic acid (CENTRUM) chewable tablet Chew 1 tablet by mouth daily.    Marland Kitchen olmesartan (BENICAR) 40 MG tablet Take 1 tablet (40 mg total) by mouth daily. 30 tablet 2  . ONETOUCH DELICA LANCETS FINE MISC 1 each by Does not apply route daily. 100 each 2  . penicillin v potassium (VEETID) 250 MG tablet Take 250 mg by mouth 4 (four) times daily.    . predniSONE (DELTASONE) 10 MG tablet Take 3 tabs on days 1-3, take 2  tabs on days 4-6, take 1 tab on days 7-9 18 tablet 0  . Penicillin V Potassium (PENICILLIN VK) 1000 UNIT/ML SOLN Take by mouth once.     No facility-administered medications prior to visit.     Allergies  Allergen Reactions  . Penicillins     Unsure of reaction  . Pravachol [Pravastatin Sodium] Other (See Comments)    Muscle aches    Review of Systems  Constitutional: Negative for chills, fever and malaise/fatigue.  HENT: Negative for congestion and hearing loss.   Eyes: Negative for discharge.  Respiratory: Negative for cough, sputum production and shortness of breath.   Cardiovascular: Negative for chest pain, palpitations and leg swelling.  Gastrointestinal: Negative for abdominal pain, blood in stool, constipation, diarrhea, heartburn, nausea and vomiting.  Genitourinary: Negative for dysuria, frequency, hematuria and urgency.  Musculoskeletal: Positive for back pain. Negative for falls and myalgias.  Skin: Negative for rash.  Neurological: Negative for dizziness,  sensory change, loss of consciousness, weakness and headaches.  Endo/Heme/Allergies: Negative for environmental allergies. Does not bruise/bleed easily.  Psychiatric/Behavioral: Negative for depression and suicidal ideas. The patient is not nervous/anxious and does not have insomnia.        Objective:    Physical Exam  Constitutional: She is oriented to person, place, and time. She appears well-developed and well-nourished.  HENT:  Head: Normocephalic and atraumatic.  Eyes: Conjunctivae and EOM are normal.  Neck: Normal range of motion. Neck supple. No JVD present. Carotid bruit is not present. No thyromegaly present.  Cardiovascular: Normal rate, regular rhythm and normal heart sounds.  No murmur heard. Pulmonary/Chest: Effort normal and breath sounds normal. No respiratory distress. She has no wheezes. She has no rales. She exhibits no tenderness.  Musculoskeletal: She exhibits tenderness. She exhibits no edema.  Neurological: She is alert and oriented to person, place, and time.  Psychiatric: She has a normal mood and affect.  Nursing note and vitals reviewed.   BP 130/90   Pulse 88   Temp 98.1 F (36.7 C) (Oral)   Ht 5\' 8"  (1.727 m)   Wt 194 lb (88 kg)   SpO2 99%   BMI 29.50 kg/m  Wt Readings from Last 3 Encounters:  05/07/17 194 lb (88 kg)  05/04/17 190 lb (86.2 kg)  02/21/17 190 lb (86.2 kg)   BP Readings from Last 3 Encounters:  05/07/17 130/90  05/04/17 (!) 160/100  02/21/17 (!) 160/100     Immunization History  Administered Date(s) Administered  . Influenza, Seasonal, Injecte, Preservative Fre 05/19/2012  . Influenza,inj,Quad PF,6+ Mos 01/07/2013, 03/09/2014, 01/14/2015, 01/10/2016, 02/21/2017  . Pneumococcal Polysaccharide-23 01/07/2013    Health Maintenance  Topic Date Due  . FOOT EXAM  06/02/2016  . OPHTHALMOLOGY EXAM  05/09/2017  . HEMOGLOBIN A1C  08/21/2017  . PNEUMOCOCCAL POLYSACCHARIDE VACCINE (2) 01/07/2018  . TETANUS/TDAP  02/03/2018  .  MAMMOGRAM  02/11/2018  . PAP SMEAR  12/27/2018  . COLONOSCOPY  05/12/2023  . INFLUENZA VACCINE  Completed  . Hepatitis C Screening  Completed  . HIV Screening  Completed    Lab Results  Component Value Date   WBC 3.8 (L) 02/21/2017   HGB 14.8 02/21/2017   HCT 44.4 02/21/2017   PLT 165.0 02/21/2017   GLUCOSE 109 (H) 02/21/2017   CHOL 191 02/21/2017   TRIG 51.0 02/21/2017   HDL 77.60 02/21/2017   LDLDIRECT 120.9 08/02/2011   LDLCALC 103 (H) 02/21/2017   ALT 20 02/21/2017   AST 21 02/21/2017   NA 144  02/21/2017   K 3.6 02/21/2017   CL 107 02/21/2017   CREATININE 0.82 02/21/2017   BUN 19 02/21/2017   CO2 32 02/21/2017   TSH 1.68 02/21/2017   HGBA1C 6.2 02/21/2017   MICROALBUR 1.9 02/21/2017    Lab Results  Component Value Date   TSH 1.68 02/21/2017   Lab Results  Component Value Date   WBC 3.8 (L) 02/21/2017   HGB 14.8 02/21/2017   HCT 44.4 02/21/2017   MCV 95.1 02/21/2017   PLT 165.0 02/21/2017   Lab Results  Component Value Date   NA 144 02/21/2017   K 3.6 02/21/2017   CO2 32 02/21/2017   GLUCOSE 109 (H) 02/21/2017   BUN 19 02/21/2017   CREATININE 0.82 02/21/2017   BILITOT 0.8 02/21/2017   ALKPHOS 65 02/21/2017   AST 21 02/21/2017   ALT 20 02/21/2017   PROT 7.1 02/21/2017   ALBUMIN 4.2 02/21/2017   CALCIUM 9.6 02/21/2017   GFR 92.28 02/21/2017   Lab Results  Component Value Date   CHOL 191 02/21/2017   Lab Results  Component Value Date   HDL 77.60 02/21/2017   Lab Results  Component Value Date   LDLCALC 103 (H) 02/21/2017   Lab Results  Component Value Date   TRIG 51.0 02/21/2017   Lab Results  Component Value Date   CHOLHDL 2 02/21/2017   Lab Results  Component Value Date   HGBA1C 6.2 02/21/2017         Assessment & Plan:   Problem List Items Addressed This Visit      Unprioritized   Abdominal pain    With flank pain R/o kidney stone --- CT  Pt had hematuria in gyn office --- pain not better      Relevant Orders   CT  Abdomen Pelvis Wo Contrast   Acute left-sided low back pain with left-sided sciatica - Primary    Check xray Flexeril and ultram for pain Alt ice and heat  F/u prn       Relevant Orders   DG Lumbar Spine Complete      I have discontinued Hampton A. Adom's penicillin vk. I am also having her maintain her multivitamin-iron-minerals-folic acid, ONETOUCH DELICA LANCETS FINE, glucose blood, olmesartan, mirabegron ER, predniSONE, methocarbamol, and penicillin v potassium.  Meds ordered this encounter  Medications  . DISCONTD: cyclobenzaprine (FLEXERIL) 10 MG tablet    Sig: Take 1 tablet (10 mg total) by mouth 3 (three) times daily as needed for muscle spasms.    Dispense:  30 tablet    Refill:  0  . DISCONTD: traMADol (ULTRAM) 50 MG tablet    Sig: Take 1 tablet (50 mg total) by mouth every 8 (eight) hours as needed.    Dispense:  30 tablet    Refill:  0    CMA served as scribe during this visit. History, Physical and Plan performed by medical provider. Documentation and orders reviewed and attested to.  [name], DO

## 2017-05-07 NOTE — Patient Instructions (Signed)

## 2017-05-07 NOTE — Assessment & Plan Note (Signed)
With flank pain R/o kidney stone --- CT  Pt had hematuria in gyn office --- pain not better

## 2017-05-07 NOTE — Assessment & Plan Note (Signed)
Check xray Flexeril and ultram for pain Alt ice and heat  F/u prn

## 2017-05-07 NOTE — Telephone Encounter (Signed)
  Reason for Disposition . Numbness in a leg or foot (i.e., loss of sensation)  Answer Assessment - Initial Assessment Questions 1. ONSET: "When did the pain begin?"      Started last Thursday 2. LOCATION: "Where does it hurt?" (upper, mid or lower back)     Left side of back and goes down her left leg 3. SEVERITY: "How bad is the pain?"  (e.g., Scale 1-10; mild, moderate, or severe)   - MILD (1-3): doesn't interfere with normal activities    - MODERATE (4-7): interferes with normal activities or awakens from sleep    - SEVERE (8-10): excruciating pain, unable to do any normal activities      10 4. PATTERN: "Is the pain constant?" (e.g., yes, no; constant, intermittent)      Comes and goes 5. RADIATION: "Does the pain shoot into your legs or elsewhere?"     Shoot down left leg 6. CAUSE:  "What do you think is causing the back pain?"      Unsure 7. BACK OVERUSE:  "Any recent lifting of heavy objects, strenuous work or exercise?"     No 8. MEDICATIONS: "What have you taken so far for the pain?" (e.g., nothing, acetaminophen, NSAIDS)     Not helping - Prednisone 9. NEUROLOGIC SYMPTOMS: "Do you have any weakness, numbness, or problems with bowel/bladder control?"     B 10. OTHER SYMPTOMS: "Do you have any other symptoms?" (e.g., fever, abdominal pain, burning with urination, blood in urine)       Thigh 11. PREGNANCY: "Is there any chance you are pregnant?" (e.g., yes, no; LMP)       no  Protocols used: BACK PAIN-A-AH Pt. Seen at [facility] Saturday and prescribed medication. States she is still having pain in left back and down her left leg.

## 2017-05-08 ENCOUNTER — Other Ambulatory Visit: Payer: Self-pay | Admitting: Family Medicine

## 2017-05-08 DIAGNOSIS — R1084 Generalized abdominal pain: Secondary | ICD-10-CM

## 2017-05-09 ENCOUNTER — Ambulatory Visit: Payer: BLUE CROSS/BLUE SHIELD | Admitting: Family Medicine

## 2017-05-09 ENCOUNTER — Encounter: Payer: Self-pay | Admitting: Family Medicine

## 2017-05-09 ENCOUNTER — Ambulatory Visit: Payer: Self-pay

## 2017-05-09 VITALS — BP 120/80 | HR 74 | Temp 98.5°F | Ht 68.0 in | Wt 187.4 lb

## 2017-05-09 DIAGNOSIS — M545 Low back pain, unspecified: Secondary | ICD-10-CM

## 2017-05-09 DIAGNOSIS — M7062 Trochanteric bursitis, left hip: Secondary | ICD-10-CM | POA: Diagnosis not present

## 2017-05-09 HISTORY — DX: Trochanteric bursitis, left hip: M70.62

## 2017-05-09 HISTORY — DX: Low back pain, unspecified: M54.50

## 2017-05-09 MED ORDER — METHYLPREDNISOLONE ACETATE 40 MG/ML IJ SUSP
40.0000 mg | Freq: Once | INTRAMUSCULAR | Status: AC
  Administered 2017-05-09: 40 mg via INTRAMUSCULAR

## 2017-05-09 MED ORDER — TRAMADOL HCL 50 MG PO TABS: 50.0000 mg | ORAL_TABLET | Freq: Three times a day (TID) | ORAL | 0 refills | Status: DC | PRN

## 2017-05-09 NOTE — Telephone Encounter (Signed)
FYI. Pt has appt w/ Dr. Nani Ravens today.

## 2017-05-09 NOTE — Patient Instructions (Signed)
Ibuprofen/Advil 400-600 mg (2-3 over the counter strength tabs) every 6 hours as needed for pain.  OK to take Tylenol 1000 mg (2 extra strength tabs) or 975 mg (3 regular strength tabs) every 6 hours as needed.  Heat (pad or rice pillow in microwave) over affected area, 10-15 minutes every 2-3 hours while awake.   Ice/cold pack over area for 10-15 min every 2-3 hours while awake.  EXERCISES  RANGE OF MOTION (ROM) AND STRETCHING EXERCISES - Low Back Prain Most people with lower back pain will find that their symptoms get worse with excessive bending forward (flexion) or arching at the lower back (extension). The exercises that will help resolve your symptoms will focus on the opposite motion.  If you have pain, numbness or tingling which travels down into your buttocks, leg or foot, the goal of the therapy is for these symptoms to move closer to your back and eventually resolve. Sometimes, these leg symptoms will get better, but your lower back pain may worsen. This is often an indication of progress in your rehabilitation. Be very alert to any changes in your symptoms and the activities in which you participated in the 24 hours prior to the change. Sharing this information with your caregiver will allow him or her to most efficiently treat your condition. These exercises may help you when beginning to rehabilitate your injury. Your symptoms may resolve with or without further involvement from your physician, physical therapist or athletic trainer. While completing these exercises, remember:   Restoring tissue flexibility helps normal motion to return to the joints. This allows healthier, less painful movement and activity.  An effective stretch should be held for at least 30 seconds.  A stretch should never be painful. You should only feel a gentle lengthening or release in the stretched tissue. FLEXION RANGE OF MOTION AND STRETCHING EXERCISES:  STRETCH - Flexion, Single Knee to Chest   Lie on  a firm bed or floor with both legs extended in front of you.  Keeping one leg in contact with the floor, bring your opposite knee to your chest. Hold your leg in place by either grabbing behind your thigh or at your knee.  Pull until you feel a gentle stretch in your low back. Hold 30 seconds.  Slowly release your grasp and repeat the exercise with the opposite side. Repeat 2 times. Complete this exercise 3 times per week.   STRETCH - Flexion, Double Knee to Chest  Lie on a firm bed or floor with both legs extended in front of you.  Keeping one leg in contact with the floor, bring your opposite knee to your chest.  Tense your stomach muscles to support your back and then lift your other knee to your chest. Hold your legs in place by either grabbing behind your thighs or at your knees.  Pull both knees toward your chest until you feel a gentle stretch in your low back. Hold 30 seconds.  Tense your stomach muscles and slowly return one leg at a time to the floor. Repeat 2 times. Complete this exercise 3 times per week.   STRETCH - Low Trunk Rotation  Lie on a firm bed or floor. Keeping your legs in front of you, bend your knees so they are both pointed toward the ceiling and your feet are flat on the floor.  Extend your arms out to the side. This will stabilize your upper body by keeping your shoulders in contact with the floor.  Gently and slowly  drop both knees together to one side until you feel a gentle stretch in your low back. Hold for 30 seconds.  Tense your stomach muscles to support your lower back as you bring your knees back to the starting position. Repeat the exercise to the other side. Repeat 2 times. Complete this exercise at least 3 times per week.   EXTENSION RANGE OF MOTION AND FLEXIBILITY EXERCISES:  STRETCH - Extension, Prone on Elbows   Lie on your stomach on the floor, a bed will be too soft. Place your palms about shoulder width apart and at the height of  your head.  Place your elbows under your shoulders. If this is too painful, stack pillows under your chest.  Allow your body to relax so that your hips drop lower and make contact more completely with the floor.  Hold this position for 30 seconds.  Slowly return to lying flat on the floor. Repeat 2 times. Complete this exercise 3 times per week.   RANGE OF MOTION - Extension, Prone Press Ups  Lie on your stomach on the floor, a bed will be too soft. Place your palms about shoulder width apart and at the height of your head.  Keeping your back as relaxed as possible, slowly straighten your elbows while keeping your hips on the floor. You may adjust the placement of your hands to maximize your comfort. As you gain motion, your hands will come more underneath your shoulders.  Hold this position 30 seconds.  Slowly return to lying flat on the floor. Repeat 2 times. Complete this exercise 3 times per week.   RANGE OF MOTION- Quadruped, Neutral Spine   Assume a hands and knees position on a firm surface. Keep your hands under your shoulders and your knees under your hips. You may place padding under your knees for comfort.  Drop your head and point your tailbone toward the ground below you. This will round out your lower back like an angry cat. Hold this position for 30 seconds.  Slowly lift your head and release your tail bone so that your back sags into a large arch, like an old horse.  Hold this position for 30 seconds.  Repeat this until you feel limber in your low back.  Now, find your "sweet spot." This will be the most comfortable position somewhere between the two previous positions. This is your neutral spine. Once you have found this position, tense your stomach muscles to support your low back.  Hold this position for 30 seconds. Repeat 2 times. Complete this exercise 3 times per week.   STRENGTHENING EXERCISES - Low Back Sprain These exercises may help you when beginning  to rehabilitate your injury. These exercises should be done near your "sweet spot." This is the neutral, low-back arch, somewhere between fully rounded and fully arched, that is your least painful position. When performed in this safe range of motion, these exercises can be used for people who have either a flexion or extension based injury. These exercises may resolve your symptoms with or without further involvement from your physician, physical therapist or athletic trainer. While completing these exercises, remember:   Muscles can gain both the endurance and the strength needed for everyday activities through controlled exercises.  Complete these exercises as instructed by your physician, physical therapist or athletic trainer. Increase the resistance and repetitions only as guided.  You may experience muscle soreness or fatigue, but the pain or discomfort you are trying to eliminate should never worsen  during these exercises. If this pain does worsen, stop and make certain you are following the directions exactly. If the pain is still present after adjustments, discontinue the exercise until you can discuss the trouble with your caregiver.  STRENGTHENING - Deep Abdominals, Pelvic Tilt   Lie on a firm bed or floor. Keeping your legs in front of you, bend your knees so they are both pointed toward the ceiling and your feet are flat on the floor.  Tense your lower abdominal muscles to press your low back into the floor. This motion will rotate your pelvis so that your tail bone is scooping upwards rather than pointing at your feet or into the floor. With a gentle tension and even breathing, hold this position for 3 seconds. Repeat 2 times. Complete this exercise 3 times per week.   STRENGTHENING - Abdominals, Crunches   Lie on a firm bed or floor. Keeping your legs in front of you, bend your knees so they are both pointed toward the ceiling and your feet are flat on the floor. Cross your arms  over your chest.  Slightly tip your chin down without bending your neck.  Tense your abdominals and slowly lift your trunk high enough to just clear your shoulder blades. Lifting higher can put excessive stress on the lower back and does not further strengthen your abdominal muscles.  Control your return to the starting position. Repeat 2 times. Complete this exercise 3 times per week.   STRENGTHENING - Quadruped, Opposite UE/LE Lift   Assume a hands and knees position on a firm surface. Keep your hands under your shoulders and your knees under your hips. You may place padding under your knees for comfort.  Find your neutral spine and gently tense your abdominal muscles so that you can maintain this position. Your shoulders and hips should form a rectangle that is parallel with the floor and is not twisted.  Keeping your trunk steady, lift your right hand no higher than your shoulder and then your left leg no higher than your hip. Make sure you are not holding your breath. Hold this position for 30 seconds.  Continuing to keep your abdominal muscles tense and your back steady, slowly return to your starting position. Repeat with the opposite arm and leg. Repeat 2 times. Complete this exercise 3 times per week.   STRENGTHENING - Abdominals and Quadriceps, Straight Leg Raise   Lie on a firm bed or floor with both legs extended in front of you.  Keeping one leg in contact with the floor, bend the other knee so that your foot can rest flat on the floor.  Find your neutral spine, and tense your abdominal muscles to maintain your spinal position throughout the exercise.  Slowly lift your straight leg off the floor about 6 inches for a count of 3, making sure to not hold your breath.  Still keeping your neutral spine, slowly lower your leg all the way to the floor. Repeat this exercise with each leg 2 times. Complete this exercise 3 times per week.  POSTURE AND BODY MECHANICS CONSIDERATIONS  - Low Back Sprain Keeping correct posture when sitting, standing or completing your activities will reduce the stress put on different body tissues, allowing injured tissues a chance to heal and limiting painful experiences. The following are general guidelines for improved posture.  While reading these guidelines, remember:  The exercises prescribed by your provider will help you have the flexibility and strength to maintain correct postures.  The correct posture provides the best environment for your joints to work. All of your joints have less wear and tear when properly supported by a spine with good posture. This means you will experience a healthier, less painful body.  Correct posture must be practiced with all of your activities, especially prolonged sitting and standing. Correct posture is as important when doing repetitive low-stress activities (typing) as it is when doing a single heavy-load activity (lifting).  RESTING POSITIONS Consider which positions are most painful for you when choosing a resting position. If you have pain with flexion-based activities (sitting, bending, stooping, squatting), choose a position that allows you to rest in a less flexed posture. You would want to avoid curling into a fetal position on your side. If your pain worsens with extension-based activities (prolonged standing, working overhead), avoid resting in an extended position such as sleeping on your stomach. Most people will find more comfort when they rest with their spine in a more neutral position, neither too rounded nor too arched. Lying on a non-sagging bed on your side with a pillow between your knees, or on your back with a pillow under your knees will often provide some relief. Keep in mind, being in any one position for a prolonged period of time, no matter how correct your posture, can still lead to stiffness.  PROPER SITTING POSTURE In order to minimize stress and discomfort on your spine, you  must sit with correct posture. Sitting with good posture should be effortless for a healthy body. Returning to good posture is a gradual process. Many people can work toward this most comfortably by using various supports until they have the flexibility and strength to maintain this posture on their own. When sitting with proper posture, your ears will fall over your shoulders and your shoulders will fall over your hips. You should use the back of the chair to support your upper back. Your lower back will be in a neutral position, just slightly arched. You may place a small pillow or folded towel at the base of your lower back for  support.  When working at a desk, create an environment that supports good, upright posture. Without extra support, muscles tire, which leads to excessive strain on joints and other tissues. Keep these recommendations in mind:  CHAIR:  A chair should be able to slide under your desk when your back makes contact with the back of the chair. This allows you to work closely.  The chair's height should allow your eyes to be level with the upper part of your monitor and your hands to be slightly lower than your elbows.  BODY POSITION  Your feet should make contact with the floor. If this is not possible, use a foot rest.  Keep your ears over your shoulders. This will reduce stress on your neck and low back.  INCORRECT SITTING POSTURES  If you are feeling tired and unable to assume a healthy sitting posture, do not slouch or slump. This puts excessive strain on your back tissues, causing more damage and pain. Healthier options include:  Using more support, like a lumbar pillow.  Switching tasks to something that requires you to be upright or walking.  Talking a brief walk.  Lying down to rest in a neutral-spine position.  PROLONGED STANDING WHILE SLIGHTLY LEANING FORWARD  When completing a task that requires you to lean forward while standing in one place for a long  time, place either foot up on a stationary 2-4  inch high object to help maintain the best posture. When both feet are on the ground, the lower back tends to lose its slight inward curve. If this curve flattens (or becomes too large), then the back and your other joints will experience too much stress, tire more quickly, and can cause pain.  CORRECT STANDING POSTURES Proper standing posture should be assumed with all daily activities, even if they only take a few moments, like when brushing your teeth. As in sitting, your ears should fall over your shoulders and your shoulders should fall over your hips. You should keep a slight tension in your abdominal muscles to brace your spine. Your tailbone should point down to the ground, not behind your body, resulting in an over-extended swayback posture.   INCORRECT STANDING POSTURES  Common incorrect standing postures include a forward head, locked knees and/or an excessive swayback. WALKING Walk with an upright posture. Your ears, shoulders and hips should all line-up.  PROLONGED ACTIVITY IN A FLEXED POSITION When completing a task that requires you to bend forward at your waist or lean over a low surface, try to find a way to stabilize 3 out of 4 of your limbs. You can place a hand or elbow on your thigh or rest a knee on the surface you are reaching across. This will provide you more stability, so that your muscles do not tire as quickly. By keeping your knees relaxed, or slightly bent, you will also reduce stress across your lower back. CORRECT LIFTING TECHNIQUES  DO :  Assume a wide stance. This will provide you more stability and the opportunity to get as close as possible to the object which you are lifting.  Tense your abdominals to brace your spine. Bend at the knees and hips. Keeping your back locked in a neutral-spine position, lift using your leg muscles. Lift with your legs, keeping your back straight.  Test the weight of unknown objects  before attempting to lift them.  Try to keep your elbows locked down at your sides in order get the best strength from your shoulders when carrying an object.     Always ask for help when lifting heavy or awkward objects. INCORRECT LIFTING TECHNIQUES DO NOT:   Lock your knees when lifting, even if it is a small object.  Bend and twist. Pivot at your feet or move your feet when needing to change directions.  Assume that you can safely pick up even a paperclip without proper posture.

## 2017-05-09 NOTE — Progress Notes (Signed)
Musculoskeletal Exam  Patient: Angela Garner DOB: [birth year]  DOS: 05/09/2017  SUBJECTIVE:  Chief Complaint:   Chief Complaint  Patient presents with  . Back Pain    Angela Garner is a 57 y.o.  female for evaluation and treatment of his back pain.   Onset:  10 days ago.  She attributed it to sleeping on the couch.  Location: L lower Character:  sharp and shooting  Progression of issue:  is a little better in back, now L hip/thigh bothering the most Associated symptoms: Now having L thigh pain on outside, reports a knot Denies bowel/bladder incontinence or weakness Treatment: to date has been ice, OTC NSAIDS, acetaminophen, muscle relaxers and heat.   Neurovascular symptoms: no  ROS: Musculoskeletal/Extremities: +back pain Neurologic: no numbness, tingling no weakness   Past Medical History:  Diagnosis Date  . Allergy   . Diabetes mellitus without complication (HCC)    no meds, only check bs  . Heart murmur   . Hyperlipidemia   . Hypertension     Objective:  VITAL SIGNS: BP 120/80 (BP Location: Left Arm, Patient Position: Sitting, Cuff Size: Large)   Pulse 74   Temp 98.5 F (36.9 C) (Oral)   Ht 5\' 8"  (1.727 m)   Wt 187 lb 6 oz (85 kg)   SpO2 99%   BMI 28.49 kg/m  Constitutional: Well formed, well developed. No acute distress. HENT: Normocephalic, atraumatic.  Thorax & Lungs:  No accessory muscle use Extremities: No clubbing. No cyanosis. No edema.  Skin: Warm. Dry. No erythema. No rash.  Musculoskeletal: low back.   Normal active range of motion: yes.   Normal passive range of motion: yes Tenderness to palpation: Tender over left hip flexor, left greater trochanteric bursa, left paraspinal musculature in the lumbar region Deformity: no Ecchymosis: no Straight leg test: negative for Neurologic: Normal sensory function. No focal deficits noted. DTR's equal and symmetry in LE's. No clonus. Psychiatric: Normal mood. Age appropriate judgment and insight. Alert &  oriented x 3.    Procedure note: Greater trochanteric bursa injection Verbal consent obtained. The area of interest was palpated and demarcated with an otoscope speculum. It was cleaned with an alcohol swab. Freeze spray was used. A 27 g needle was inserted at a perpendicular angle through the area of interested. The plunger was withdrawn to ensure our placement was not in a vessel. 2 mL of 1% lidocaine without epi and 40 mg of Depo was injected. A bandaid was placed. The patient tolerated the procedure well.  There were no complications noted.   Assessment:  Greater trochanteric bursitis of left hip - Plan: PR DRAIN/INJECT LARGE JOINT/BURSA, methylPREDNISolone acetate (DEPO-MEDROL) injection 40 mg  Acute left-sided low back pain without sciatica - Plan: traMADol (ULTRAM) 50 MG tablet, methylPREDNISolone acetate (DEPO-MEDROL) injection 40 mg  Plan: Orders as above. Home stretches/exercises, heat, ice, Tylenol, NSAIDs, Tramadol.  F/u prn.  The patient voiced understanding and agreement to the plan.   [place], DO 05/09/17  12:10 PM

## 2017-05-09 NOTE — Telephone Encounter (Signed)
   Reason for Disposition . Numbness in a leg or foot (i.e., loss of sensation)  Answer Assessment - Initial Assessment Questions 1. ONSET: "When did the pain begin?"      Started x 1 week ago 2. LOCATION: "Where does it hurt?" (upper, mid or lower back)     Lower back and left side; left thigh has a "knot" 3. SEVERITY: "How bad is the pain?"  (e.g., Scale 1-10; mild, moderate, or severe)   - MILD (1-3): doesn't interfere with normal activities    - MODERATE (4-7): interferes with normal activities or awakens from sleep    - SEVERE (8-10): excruciating pain, unable to do any normal activities      9 4. PATTERN: "Is the pain constant?" (e.g., yes, no; constant, intermittent)      Constant  - "I can't sleep." 5. RADIATION: "Does the pain shoot into your legs or elsewhere?"     Legs 6. CAUSE:  "What do you think is causing the back pain?"      Unsure 7. BACK OVERUSE:  "Any recent lifting of heavy objects, strenuous work or exercise?"     No 8. MEDICATIONS: "What have you taken so far for the pain?" (e.g., nothing, acetaminophen, NSAIDS)     Tramadol did not help 9. NEUROLOGIC SYMPTOMS: "Do you have any weakness, numbness, or problems with bowel/bladder control?"     No 10. OTHER SYMPTOMS: "Do you have any other symptoms?" (e.g., fever, abdominal pain, burning with urination, blood in urine)       No 11. PREGNANCY: "Is there any chance you are pregnant?" (e.g., yes, no; LMP)       No  Protocols used: BACK PAIN-A-AH  States "This is getting worse - I couldn't sleep last night. Something is going on." Appointment made for today.

## 2017-05-13 ENCOUNTER — Ambulatory Visit (HOSPITAL_BASED_OUTPATIENT_CLINIC_OR_DEPARTMENT_OTHER)
Admission: RE | Admit: 2017-05-13 | Discharge: 2017-05-13 | Disposition: A | Discharge status: Home / Self Care | Payer: BLUE CROSS/BLUE SHIELD | Source: Ambulatory Visit | Attending: Family Medicine | Admitting: Family Medicine

## 2017-05-13 DIAGNOSIS — D259 Leiomyoma of uterus, unspecified: Secondary | ICD-10-CM | POA: Insufficient documentation

## 2017-05-13 DIAGNOSIS — R1084 Generalized abdominal pain: Secondary | ICD-10-CM

## 2017-05-14 ENCOUNTER — Telehealth: Payer: Self-pay | Admitting: Family Medicine

## 2017-05-14 ENCOUNTER — Encounter: Payer: Self-pay | Admitting: Family Medicine

## 2017-05-14 ENCOUNTER — Other Ambulatory Visit: Payer: Self-pay | Admitting: Family Medicine

## 2017-05-14 DIAGNOSIS — M545 Low back pain: Secondary | ICD-10-CM

## 2017-05-14 MED ORDER — HYDROCODONE-ACETAMINOPHEN 5-325 MG PO TABS: 1.0000 | ORAL_TABLET | Freq: Four times a day (QID) | ORAL | 0 refills | Status: DC | PRN

## 2017-05-14 NOTE — Telephone Encounter (Signed)
LMOM informing Pt that Rx has been placed at front desk for pick up at her convenience.  

## 2017-05-14 NOTE — Telephone Encounter (Signed)
Copied from [place] [id number]. Topic: General - Other >> May 14, 2017  8:18 AM Yvette Rack wrote: Reason for CRM: patient calling stating that his pain range is a 7 when he walk  the medicine Tramadol that he has taken isn't helping and he can't sleep because of the pain he just went to have CT done yesterday his blood sugar has went up to 140 this morning

## 2017-05-14 NOTE — Telephone Encounter (Signed)
vicocin 5/325 mg #20 1 po q6 h prn  F/u Thursday or Friday If pain con't with pain med -- please go to ER---  I was not able to send electronically--- it printed

## 2017-05-14 NOTE — Telephone Encounter (Signed)
LMOM informing Pt that Rx Vicodin has been placed at front desk for pick up- electronic Rx for controls not working at this time.

## 2017-05-21 ENCOUNTER — Encounter: Payer: Self-pay | Admitting: Family Medicine

## 2017-05-21 NOTE — Telephone Encounter (Signed)
flonase and otc claritin , allegra or zyrtec  mucinex or mucinex dm Or ov if she needs more

## 2017-05-22 ENCOUNTER — Other Ambulatory Visit: Payer: Self-pay | Admitting: Family Medicine

## 2017-05-22 DIAGNOSIS — I10 Essential (primary) hypertension: Secondary | ICD-10-CM

## 2017-05-24 ENCOUNTER — Encounter: Payer: Self-pay | Admitting: Family Medicine

## 2017-05-24 ENCOUNTER — Ambulatory Visit: Payer: BLUE CROSS/BLUE SHIELD | Admitting: Family Medicine

## 2017-05-24 VITALS — BP 138/90 | HR 73 | Temp 98.1°F | Resp 16 | Ht 68.0 in | Wt 188.4 lb

## 2017-05-24 DIAGNOSIS — J324 Chronic pansinusitis: Secondary | ICD-10-CM | POA: Diagnosis not present

## 2017-05-24 DIAGNOSIS — M5440 Lumbago with sciatica, unspecified side: Secondary | ICD-10-CM | POA: Diagnosis not present

## 2017-05-24 DIAGNOSIS — M5442 Lumbago with sciatica, left side: Secondary | ICD-10-CM | POA: Diagnosis not present

## 2017-05-24 DIAGNOSIS — J029 Acute pharyngitis, unspecified: Secondary | ICD-10-CM

## 2017-05-24 DIAGNOSIS — M545 Low back pain, unspecified: Secondary | ICD-10-CM

## 2017-05-24 LAB — POC URINALSYSI DIPSTICK (AUTOMATED)
Bilirubin, UA: NEGATIVE
Blood, UA: NEGATIVE
Glucose, UA: NEGATIVE
Ketones, UA: NEGATIVE
Leukocytes, UA: NEGATIVE
Nitrite, UA: NEGATIVE
Protein, UA: NEGATIVE
Spec Grav, UA: <=1.005 — AB (ref 1.010–1.025)
Urobilinogen, UA: 0.2 E.U./dL
pH, UA: 6 (ref 5.0–8.0)

## 2017-05-24 LAB — POCT RAPID STREP A (OFFICE): Rapid Strep A Screen: NEGATIVE

## 2017-05-24 MED ORDER — LORATADINE 10 MG PO TABS: 10.0000 mg | ORAL_TABLET | Freq: Every day | ORAL | 11 refills | Status: DC

## 2017-05-24 MED ORDER — AMOXICILLIN-POT CLAVULANATE 875-125 MG PO TABS: 1.0000 | ORAL_TABLET | Freq: Two times a day (BID) | ORAL | 0 refills | Status: DC

## 2017-05-24 MED ORDER — FLUTICASONE PROPIONATE 50 MCG/ACT NA SUSP
2.0000 | Freq: Every day | NASAL | 6 refills | Status: DC
Start: 2017-05-24 — End: 2018-10-06

## 2017-05-24 NOTE — Patient Instructions (Signed)

## 2017-05-24 NOTE — Progress Notes (Signed)
Patient ID: Angela Garner, female   DOB: [birth year], 57 y.o.   MRN: [id number]    Subjective:  I acted as a Education administrator for Dr. Carollee Garner.  Angela Garner, Angela Garner   Patient ID: Angela Garner, female    DOB: 03-26-1961, 57 y.o.   MRN: [id number]  Chief Complaint  Patient presents with  . left thigh pain and swelling  . Sore Throat    HPI  Patient is in today for left thigh pain and sore throat.    Pt still c/o low back pain.  Previous xray reviewed.  Pain now radiates down L leg to knee.   Pt also c/o sore throat and sneezing ,  No cough, no fever.  Taking coricidin and left over penicillin --- slightly better.   Patient Care Team: Angela Garner, Alferd Apa, DO as PCP - General   Past Medical History:  Diagnosis Date  . Allergy   . Diabetes mellitus without complication (HCC)    no meds, only check bs  . Heart murmur   . Hyperlipidemia   . Hypertension     Past Surgical History:  Procedure Laterality Date  . UTERINE FIBROID SURGERY      Family History  Problem Relation Age of Onset  . Hypertension Father   . Heart disease Father   . Diabetes Father   . Dementia Mother   . Clotting disorder Brother   . Colon cancer Neg Hx   . Esophageal cancer Neg Hx   . Stomach cancer Neg Hx   . Rectal cancer Neg Hx     Social History   Socioeconomic History  . Marital status: Married    Spouse name: Not on file  . Number of children: Not on file  . Years of education: Not on file  . Highest education level: Not on file  Social Needs  . Financial resource strain: Not on file  . Food insecurity - worry: Not on file  . Food insecurity - inability: Not on file  . Transportation needs - medical: Not on file  . Transportation needs - non-medical: Not on file  Occupational History  . Occupation: quality conrol     Comment: [employer]  Tobacco Use  . Smoking status: Never Smoker  . Smokeless tobacco: Never Used  Substance and Sexual Activity  . Alcohol use: No  . Drug use: No  . Sexual  activity: Yes  Other Topics Concern  . Not on file  Social History Narrative  . Not on file    Outpatient Medications Prior to Visit  Medication Sig Dispense Refill  . cyclobenzaprine (FLEXERIL) 10 MG tablet Take 1 tablet (10 mg total) by mouth 3 (three) times daily as needed for muscle spasms. 30 tablet 0  . glucose blood (ONE TOUCH TEST STRIPS) test strip Test Blood sugar twice a day 100 each 2  . HYDROcodone-acetaminophen (NORCO/VICODIN) 5-325 MG tablet Take 1 tablet by mouth every 6 (six) hours as needed for moderate pain. 20 tablet 0  . methocarbamol (ROBAXIN) 500 MG tablet Take 1 tablet (500 mg total) by mouth at bedtime as needed for muscle spasms. 10 tablet 0  . mirabegron ER (MYRBETRIQ) 50 MG TB24 tablet Take 1 tablet (50 mg total) by mouth daily. 30 tablet 2  . multivitamin-iron-minerals-folic acid (CENTRUM) chewable tablet Chew 1 tablet by mouth daily.    Marland Kitchen olmesartan (BENICAR) 40 MG tablet TAKE 1 TABLET BY MOUTH ONCE DAILY 90 tablet 0  . [place]  1 each by Does not apply route daily. 100 each 2  . traMADol (ULTRAM) 50 MG tablet Take 1 tablet (50 mg total) by mouth every 8 (eight) hours as needed. 30 tablet 0   No facility-administered medications prior to visit.     Allergies  Allergen Reactions  . Pravachol [Pravastatin Sodium] Other (See Comments)    Muscle aches    Review of Systems  Constitutional: Negative for fever and malaise/fatigue.  HENT: Positive for sore throat. Negative for congestion, ear discharge, ear pain, hearing loss, nosebleeds, sinus pain and tinnitus.   Eyes: Negative for blurred vision.  Respiratory: Negative for cough and shortness of breath.   Cardiovascular: Negative for chest pain, palpitations and leg swelling.  Gastrointestinal: Negative for vomiting.  Musculoskeletal: Negative for back pain.       Left thigh pain   Skin: Negative for rash.  Neurological: Negative for loss of consciousness and headaches.         Objective:    Physical Exam  Constitutional: She is oriented to person, place, and time. She appears well-developed and well-nourished.  HENT:  Head: Normocephalic and atraumatic.  Right Ear: Hearing, tympanic membrane, external ear and ear canal normal.  Left Ear: Hearing, tympanic membrane, external ear and ear canal normal.  Nose: Rhinorrhea present. Right sinus exhibits maxillary sinus tenderness and frontal sinus tenderness. Left sinus exhibits maxillary sinus tenderness and frontal sinus tenderness.  Mouth/Throat: Posterior oropharyngeal erythema present.  + PND + errythema  Eyes: Conjunctivae and EOM are normal. Right eye exhibits no discharge. Left eye exhibits no discharge.  Neck: Normal range of motion. Neck supple. No JVD present. Carotid bruit is not present. No thyromegaly present.  Cardiovascular: Normal rate, regular rhythm and normal heart sounds.  No murmur heard. Pulmonary/Chest: Effort normal and breath sounds normal. No respiratory distress. She has no wheezes. She has no rales. She exhibits no tenderness.  Musculoskeletal: She exhibits no edema.       Left hip: She exhibits decreased strength and tenderness.  Dec strength r hip flexor and r dorsiflexion R foot  dtr-- = and b/l   Lymphadenopathy:    She has no cervical adenopathy.  Neurological: She is alert and oriented to person, place, and time.  Psychiatric: She has a normal mood and affect.  Nursing note and vitals reviewed.   BP 138/90   Pulse 73   Temp 98.1 F (36.7 C) (Oral)   Resp 16   Ht 5\' 8"  (1.727 m)   Wt 188 lb 6.4 oz (85.5 kg)   SpO2 98%   BMI 28.65 kg/m  Wt Readings from Last 3 Encounters:  05/24/17 188 lb 6.4 oz (85.5 kg)  05/09/17 187 lb 6 oz (85 kg)  05/07/17 194 lb (88 kg)   BP Readings from Last 3 Encounters:  05/24/17 138/90  05/09/17 120/80  05/07/17 130/90     Immunization History  Administered Date(s) Administered  . Influenza, Seasonal, Injecte, Preservative Fre  05/19/2012  . Influenza,inj,Quad PF,6+ Mos 01/07/2013, 03/09/2014, 01/14/2015, 01/10/2016, 02/21/2017  . Pneumococcal Polysaccharide-23 01/07/2013    Health Maintenance  Topic Date Due  . FOOT EXAM  06/02/2016  . OPHTHALMOLOGY EXAM  05/09/2017  . HEMOGLOBIN A1C  08/21/2017  . PNEUMOCOCCAL POLYSACCHARIDE VACCINE (2) 01/07/2018  . TETANUS/TDAP  02/03/2018  . MAMMOGRAM  02/11/2018  . PAP SMEAR  12/27/2018  . COLONOSCOPY  05/12/2023  . INFLUENZA VACCINE  Completed  . Hepatitis C Screening  Completed  . HIV Screening  Completed  Lab Results  Component Value Date   WBC 3.8 (L) 02/21/2017   HGB 14.8 02/21/2017   HCT 44.4 02/21/2017   PLT 165.0 02/21/2017   GLUCOSE 109 (H) 02/21/2017   CHOL 191 02/21/2017   TRIG 51.0 02/21/2017   HDL 77.60 02/21/2017   LDLDIRECT 120.9 08/02/2011   LDLCALC 103 (H) 02/21/2017   ALT 20 02/21/2017   AST 21 02/21/2017   NA 144 02/21/2017   K 3.6 02/21/2017   CL 107 02/21/2017   CREATININE 0.82 02/21/2017   BUN 19 02/21/2017   CO2 32 02/21/2017   TSH 1.68 02/21/2017   HGBA1C 6.2 02/21/2017   MICROALBUR 1.9 02/21/2017    Lab Results  Component Value Date   TSH 1.68 02/21/2017   Lab Results  Component Value Date   WBC 3.8 (L) 02/21/2017   HGB 14.8 02/21/2017   HCT 44.4 02/21/2017   MCV 95.1 02/21/2017   PLT 165.0 02/21/2017   Lab Results  Component Value Date   NA 144 02/21/2017   K 3.6 02/21/2017   CO2 32 02/21/2017   GLUCOSE 109 (H) 02/21/2017   BUN 19 02/21/2017   CREATININE 0.82 02/21/2017   BILITOT 0.8 02/21/2017   ALKPHOS 65 02/21/2017   AST 21 02/21/2017   ALT 20 02/21/2017   PROT 7.1 02/21/2017   ALBUMIN 4.2 02/21/2017   CALCIUM 9.6 02/21/2017   GFR 92.28 02/21/2017   Lab Results  Component Value Date   CHOL 191 02/21/2017   Lab Results  Component Value Date   HDL 77.60 02/21/2017   Lab Results  Component Value Date   LDLCALC 103 (H) 02/21/2017   Lab Results  Component Value Date   TRIG 51.0 02/21/2017     Lab Results  Component Value Date   CHOLHDL 2 02/21/2017   Lab Results  Component Value Date   HGBA1C 6.2 02/21/2017         Assessment & Plan:   Problem List Items Addressed This Visit      Unprioritized   Acute left-sided low back pain with left-sided sciatica    con't pain med and muscle relaxer per orders Mri Alt heat and ice  F/u 2 week s or sooner        Other Visit Diagnoses    Sore throat    -  Primary   Relevant Orders   POCT rapid strep A (Completed)   Culture, Group A Strep   Low back pain with radiation       Relevant Orders   MR Lumbar Spine Wo Contrast   POCT Urinalysis Dipstick (Automated) (Completed)   Pansinusitis, unspecified chronicity       Relevant Medications   amoxicillin-clavulanate (AUGMENTIN) 875-125 MG tablet   fluticasone (FLONASE) 50 MCG/ACT nasal spray   loratadine (CLARITIN) 10 MG tablet      I am having Hawraa A. Adom start on amoxicillin-clavulanate, fluticasone, and loratadine. I am also having her maintain her multivitamin-iron-minerals-folic acid, ONETOUCH DELICA LANCETS FINE, glucose blood, mirabegron ER, methocarbamol, cyclobenzaprine, traMADol, HYDROcodone-acetaminophen, and olmesartan.  Meds ordered this encounter  Medications  . amoxicillin-clavulanate (AUGMENTIN) 875-125 MG tablet    Sig: Take 1 tablet by mouth 2 (two) times daily.    Dispense:  20 tablet    Refill:  0  . fluticasone (FLONASE) 50 MCG/ACT nasal spray    Sig: Place 2 sprays into both nostrils daily.    Dispense:  16 g    Refill:  6  . loratadine (CLARITIN) 10 MG  tablet    Sig: Take 1 tablet (10 mg total) by mouth daily.    Dispense:  30 tablet    Refill:  11    CMA served as scribe during this visit. History, Physical and Plan performed by medical provider. Documentation and orders reviewed and attested to.  [name], DO

## 2017-05-24 NOTE — Assessment & Plan Note (Signed)
con't pain med and muscle relaxer per orders Mri Alt heat and ice  F/u 2 week s or sooner

## 2017-05-24 NOTE — Telephone Encounter (Signed)
She needs ov We can see her today

## 2017-05-26 LAB — CULTURE, GROUP A STREP
MICRO NUMBER:: 90139805
SPECIMEN QUALITY:: ADEQUATE

## 2017-05-28 ENCOUNTER — Ambulatory Visit (HOSPITAL_COMMUNITY)
Admission: RE | Admit: 2017-05-28 | Discharge: 2017-05-28 | Disposition: A | Discharge status: Home / Self Care | Payer: BLUE CROSS/BLUE SHIELD | Source: Ambulatory Visit | Attending: Family Medicine | Admitting: Family Medicine

## 2017-05-28 ENCOUNTER — Other Ambulatory Visit: Payer: Self-pay | Admitting: Family Medicine

## 2017-05-28 DIAGNOSIS — M48061 Spinal stenosis, lumbar region without neurogenic claudication: Secondary | ICD-10-CM | POA: Diagnosis not present

## 2017-05-28 DIAGNOSIS — M4807 Spinal stenosis, lumbosacral region: Secondary | ICD-10-CM | POA: Insufficient documentation

## 2017-05-28 DIAGNOSIS — M5126 Other intervertebral disc displacement, lumbar region: Secondary | ICD-10-CM | POA: Insufficient documentation

## 2017-05-28 DIAGNOSIS — M545 Low back pain, unspecified: Secondary | ICD-10-CM

## 2017-05-28 DIAGNOSIS — M5440 Lumbago with sciatica, unspecified side: Secondary | ICD-10-CM | POA: Insufficient documentation

## 2017-06-07 DIAGNOSIS — M5127 Other intervertebral disc displacement, lumbosacral region: Secondary | ICD-10-CM | POA: Diagnosis not present

## 2017-06-07 DIAGNOSIS — M5416 Radiculopathy, lumbar region: Secondary | ICD-10-CM | POA: Diagnosis not present

## 2017-07-02 DIAGNOSIS — M5416 Radiculopathy, lumbar region: Secondary | ICD-10-CM | POA: Diagnosis not present

## 2017-08-08 DIAGNOSIS — M5127 Other intervertebral disc displacement, lumbosacral region: Secondary | ICD-10-CM | POA: Diagnosis not present

## 2017-08-21 ENCOUNTER — Other Ambulatory Visit: Payer: Self-pay | Admitting: Family Medicine

## 2017-08-21 DIAGNOSIS — M5416 Radiculopathy, lumbar region: Secondary | ICD-10-CM | POA: Diagnosis not present

## 2017-08-21 DIAGNOSIS — I10 Essential (primary) hypertension: Secondary | ICD-10-CM

## 2017-08-21 DIAGNOSIS — M5126 Other intervertebral disc displacement, lumbar region: Secondary | ICD-10-CM | POA: Diagnosis not present

## 2017-08-21 DIAGNOSIS — M5127 Other intervertebral disc displacement, lumbosacral region: Secondary | ICD-10-CM | POA: Diagnosis not present

## 2017-08-27 ENCOUNTER — Ambulatory Visit: Payer: BLUE CROSS/BLUE SHIELD | Admitting: Family Medicine

## 2017-08-27 ENCOUNTER — Ambulatory Visit (HOSPITAL_BASED_OUTPATIENT_CLINIC_OR_DEPARTMENT_OTHER)
Admission: RE | Admit: 2017-08-27 | Discharge: 2017-08-27 | Disposition: A | Discharge status: Home / Self Care | Payer: BLUE CROSS/BLUE SHIELD | Source: Ambulatory Visit | Attending: Family Medicine | Admitting: Family Medicine

## 2017-08-27 ENCOUNTER — Encounter: Payer: Self-pay | Admitting: Family Medicine

## 2017-08-27 ENCOUNTER — Telehealth: Payer: Self-pay | Admitting: Family Medicine

## 2017-08-27 VITALS — BP 156/106 | HR 75 | Temp 98.0°F | Resp 16 | Ht 68.0 in | Wt 191.4 lb

## 2017-08-27 DIAGNOSIS — M47812 Spondylosis without myelopathy or radiculopathy, cervical region: Secondary | ICD-10-CM | POA: Diagnosis not present

## 2017-08-27 DIAGNOSIS — R202 Paresthesia of skin: Secondary | ICD-10-CM | POA: Diagnosis not present

## 2017-08-27 DIAGNOSIS — E1165 Type 2 diabetes mellitus with hyperglycemia: Secondary | ICD-10-CM | POA: Diagnosis not present

## 2017-08-27 DIAGNOSIS — I10 Essential (primary) hypertension: Secondary | ICD-10-CM

## 2017-08-27 DIAGNOSIS — R200 Anesthesia of skin: Secondary | ICD-10-CM | POA: Insufficient documentation

## 2017-08-27 DIAGNOSIS — M542 Cervicalgia: Secondary | ICD-10-CM | POA: Diagnosis not present

## 2017-08-27 LAB — COMPREHENSIVE METABOLIC PANEL
ALT: 16 U/L (ref 0–35)
AST: 18 U/L (ref 0–37)
Albumin: 4.1 g/dL (ref 3.5–5.2)
Alkaline Phosphatase: 67 U/L (ref 39–117)
BUN: 10 mg/dL (ref 6–23)
CO2: 30 mEq/L (ref 19–32)
Calcium: 9.4 mg/dL (ref 8.4–10.5)
Chloride: 104 mEq/L (ref 96–112)
Creatinine, Ser: 0.72 mg/dL (ref 0.40–1.20)
GFR: 107.03 mL/min (ref >60.00)
Glucose, Bld: 135 mg/dL — ABNORMAL HIGH (ref 70–99)
Potassium: 3.7 mEq/L (ref 3.5–5.1)
Sodium: 142 mEq/L (ref 135–145)
Total Bilirubin: 0.8 mg/dL (ref 0.2–1.2)
Total Protein: 7.2 g/dL (ref 6.0–8.3)

## 2017-08-27 LAB — HEMOGLOBIN A1C: Hgb A1c MFr Bld: 6.5 % (ref 4.6–6.5)

## 2017-08-27 LAB — LIPID PANEL
Cholesterol: 205 mg/dL — ABNORMAL HIGH (ref 0–200)
HDL: 74 mg/dL (ref >39.00)
LDL Cholesterol: 115 mg/dL — ABNORMAL HIGH (ref 0–99)
NonHDL: 130.81
Total CHOL/HDL Ratio: 3
Triglycerides: 77 mg/dL (ref 0.0–149.0)
VLDL: 15.4 mg/dL (ref 0.0–40.0)

## 2017-08-27 MED ORDER — AMLODIPINE BESYLATE 5 MG PO TABS: 5.0000 mg | ORAL_TABLET | Freq: Every day | ORAL | 3 refills | Status: DC

## 2017-08-27 NOTE — Patient Instructions (Signed)

## 2017-08-27 NOTE — Progress Notes (Signed)
Patient ID: Angela Garner, female    DOB: 16-Oct-1960  Age: 57 y.o. MRN: [id number]    Subjective:  Subjective  HPI Angela Garner presents for bp check -- she ran out of her meds -- the pharmacy gave her 3 pills for Fri, sat, sun.  She states even when she was taking it it was high  HYPERTENSION   Blood pressure range-not checking   Chest pain- no      Dyspnea- no Lightheadedness- no   Edema- no  Other side effects - no   Medication compliance: poor Low salt diet- yew     DIABETES    Blood Sugar ranges-140s   Polyuria- no New Visual problems- no  Hypoglycemic symptoms- no  Other side effects-no Medication compliance - na Last eye exam- due  Foot exam- today   HYPERLIPIDEMIA  Medication compliance- na RUQ pain- no  Muscle aches- no Other side effects-no  Diabetic Foot Exam - Simple   Simple Foot Form Diabetic Foot exam was performed with the following findings:  Yes 08/27/2017 10:28 AM  Visual Inspection No deformities, no ulcerations, no other skin breakdown bilaterally:  Yes Sensation Testing Intact to touch and monofilament testing bilaterally:  Yes Pulse Check Posterior Tibialis and Dorsalis pulse intact bilaterally:  Yes Comments      Review of Systems  Constitutional: Negative for fever.  HENT: Negative for congestion.   Respiratory: Negative for shortness of breath.   Cardiovascular: Negative for chest pain, palpitations and leg swelling.  Gastrointestinal: Negative for abdominal pain, blood in stool and nausea.  Genitourinary: Negative for dysuria and frequency.  Musculoskeletal: Positive for arthralgias and myalgias.  Skin: Negative for rash.  Allergic/Immunologic: Negative for environmental allergies.  Neurological: Positive for numbness. Negative for dizziness and headaches.  Psychiatric/Behavioral: The patient is not nervous/anxious.     History Past Medical History:  Diagnosis Date  . Allergy   . Diabetes mellitus without complication (HCC)    no  meds, only check bs  . Heart murmur   . Hyperlipidemia   . Hypertension     She has a past surgical history that includes Uterine fibroid surgery.   Her family history includes Clotting disorder in her brother; Dementia in her mother; Diabetes in her father; Heart disease in her father; Hypertension in her father.She reports that she has never smoked. She has never used smokeless tobacco. She reports that she does not drink alcohol or use drugs.  Current Outpatient Medications on File Prior to Visit  Medication Sig Dispense Refill  . diclofenac (VOLTAREN) 75 MG EC tablet Take 75 mg by mouth 2 (two) times daily.    . fluticasone (FLONASE) 50 MCG/ACT nasal spray Place 2 sprays into both nostrils daily. 16 g 6  . gabapentin (NEURONTIN) 300 MG capsule Take 300 mg by mouth 3 (three) times daily.    Marland Kitchen glucose blood (ONE TOUCH TEST STRIPS) test strip Test Blood sugar twice a day 100 each 2  . loratadine (CLARITIN) 10 MG tablet Take 1 tablet (10 mg total) by mouth daily. 30 tablet 11  . mirabegron ER (MYRBETRIQ) 50 MG TB24 tablet Take 1 tablet (50 mg total) by mouth daily. 30 tablet 2  . multivitamin-iron-minerals-folic acid (CENTRUM) chewable tablet Chew 1 tablet by mouth daily.    Marland Kitchen olmesartan (BENICAR) 40 MG tablet TAKE 1 TABLET BY MOUTH ONCE DAILY 90 tablet 4  . ONETOUCH DELICA LANCETS FINE MISC 1 each by Does not apply route daily. 100 each 2  No current facility-administered medications on file prior to visit.      Objective:  Objective  Physical Exam  Constitutional: She is oriented to person, place, and time. She appears well-developed and well-nourished.  HENT:  Head: Normocephalic and atraumatic.  Eyes: Conjunctivae and EOM are normal.  Neck: Normal range of motion. Neck supple. No JVD present. Carotid bruit is not present. No thyromegaly present.  Cardiovascular: Normal rate, regular rhythm and normal heart sounds.  No murmur heard. Pulmonary/Chest: Effort normal and breath  sounds normal. No respiratory distress. She has no wheezes. She has no rales. She exhibits no tenderness.  Musculoskeletal: She exhibits no edema.  Neurological: She is alert and oriented to person, place, and time.  Psychiatric: She has a normal mood and affect.  Nursing note and vitals reviewed.  BP (!) 156/106 (BP Location: Right Arm, Cuff Size: Normal)   Pulse 75   Temp 98 F (36.7 C) (Oral)   Resp 16   Ht 5\' 8"  (1.727 m)   Wt 191 lb 6.4 oz (86.8 kg)   SpO2 98%   BMI 29.10 kg/m  Wt Readings from Last 3 Encounters:  08/27/17 191 lb 6.4 oz (86.8 kg)  05/24/17 188 lb 6.4 oz (85.5 kg)  05/09/17 187 lb 6 oz (85 kg)     Lab Results  Component Value Date   WBC 3.8 (L) 02/21/2017   HGB 14.8 02/21/2017   HCT 44.4 02/21/2017   PLT 165.0 02/21/2017   GLUCOSE 135 (H) 08/27/2017   CHOL 205 (H) 08/27/2017   TRIG 77.0 08/27/2017   HDL 74.00 08/27/2017   LDLDIRECT 120.9 08/02/2011   LDLCALC 115 (H) 08/27/2017   ALT 16 08/27/2017   AST 18 08/27/2017   NA 142 08/27/2017   K 3.7 08/27/2017   CL 104 08/27/2017   CREATININE 0.72 08/27/2017   BUN 10 08/27/2017   CO2 30 08/27/2017   TSH 1.68 02/21/2017   HGBA1C 6.5 08/27/2017   MICROALBUR 1.9 02/21/2017    Mr Lumbar Spine Wo Contrast  Result Date: 05/28/2017 CLINICAL DATA:  Low back pain with left leg pain EXAM: MRI LUMBAR SPINE WITHOUT CONTRAST TECHNIQUE: Multiplanar, multisequence MR imaging of the lumbar spine was performed. No intravenous contrast was administered. COMPARISON:  Lumbar radiographs 05/07/2017 FINDINGS: Segmentation:  Normal Alignment:  Mild retrolisthesis L3-4.  Mild anterolisthesis L4-5 Vertebrae:  Negative for fracture or mass. Conus medullaris and cauda equina: Conus extends to the L1-2 level. Conus and cauda equina appear normal. Paraspinal and other soft tissues: Negative for mass or adenopathy. Disc levels: L1-2: Negative L2-3: Extruded disc fragment on the left with upgoing disc material. Impingement of the  left L2 nerve root under the pedicle. Bilateral facet hypertrophy and mild spinal stenosis. L3-4: Small central disc protrusion. Mild facet degeneration. No significant stenosis L4-5: Small central disc protrusion. Disc bulging and facet degeneration. Mild spinal stenosis. Mild foraminal stenosis bilaterally L5-S1: Mild disc and facet degeneration. Mild subarticular stenosis bilaterally. IMPRESSION: Extruded disc fragment on the left L2-3 with impingement of the left L2 nerve root. Mild spinal stenosis Mild spinal stenosis and mild foraminal stenosis bilaterally L4-5. Mild subarticular stenosis bilaterally L5-S1. Electronically Signed   By: Franchot Gallo M.D.   On: 05/28/2017 14:21     Assessment & Plan:  Plan  I have discontinued Angela Garner methocarbamol, cyclobenzaprine, traMADol, HYDROcodone-acetaminophen, and amoxicillin-clavulanate. I am also having her start on amLODipine. Additionally, I am having her maintain her multivitamin-iron-minerals-folic acid, ONETOUCH DELICA LANCETS FINE, glucose blood, mirabegron  ER, fluticasone, loratadine, olmesartan, diclofenac, and gabapentin.  Meds ordered this encounter  Medications  . amLODipine (NORVASC) 5 MG tablet    Sig: Take 1 tablet (5 mg total) by mouth daily.    Dispense:  30 tablet    Refill:  3    Problem List Items Addressed This Visit      Unprioritized   Essential hypertension - Primary    Poorly controlled will alter medications, encouraged DASH diet, minimize caffeine and obtain adequate sleep. Report concerning symptoms and follow up as directed and as needed      Relevant Medications   amLODipine (NORVASC) 5 MG tablet   Other Relevant Orders   Lipid panel (Completed)   Hemoglobin A1c (Completed)   Comprehensive metabolic panel (Completed)   Hyperglycemia    Check labs        Other Visit Diagnoses    Numbness and tingling in left arm       Relevant Orders   DG Cervical Spine Complete (Completed)      Follow-up:  Return in about 6 months (around 02/27/2018) for annual exam, fasting.  [name], DO

## 2017-08-27 NOTE — Telephone Encounter (Signed)
Copied from [place] [id number]. Topic: Quick Communication - Rx Refill/Question >> Aug 27, 2017  3:26 PM Synthia Innocent wrote: Medication: olmesartan (BENICAR) 40 MG tablet, Requesting 20mg , 40mg  is on back order. Will have 40mg  tomorrow, but will need 20mg  for tonight. Patient is completely out Has the patient contacted their pharmacy? Yes.   (Agent: If no, request that the patient contact the pharmacy for the refill.) Preferred Pharmacy (with phone number or street name): [facility] [phone] Agent: Please be advised that RX refills may take up to 3 business days. We ask that you follow-up with your pharmacy.

## 2017-08-28 NOTE — Assessment & Plan Note (Signed)
Check labs 

## 2017-08-28 NOTE — Assessment & Plan Note (Signed)
Poorly controlled will alter medications, encouraged DASH diet, minimize caffeine and obtain adequate sleep. Report concerning symptoms and follow up as directed and as needed 

## 2017-09-04 ENCOUNTER — Encounter: Payer: Self-pay | Admitting: Family Medicine

## 2017-09-04 DIAGNOSIS — M5127 Other intervertebral disc displacement, lumbosacral region: Secondary | ICD-10-CM | POA: Diagnosis not present

## 2017-09-04 DIAGNOSIS — M545 Low back pain: Secondary | ICD-10-CM | POA: Diagnosis not present

## 2017-09-09 ENCOUNTER — Other Ambulatory Visit: Payer: Self-pay | Admitting: *Deleted

## 2017-09-09 ENCOUNTER — Encounter: Payer: Self-pay | Admitting: *Deleted

## 2017-09-09 MED ORDER — SIMVASTATIN 20 MG PO TABS: 20.0000 mg | ORAL_TABLET | Freq: Every day | ORAL | 2 refills | Status: DC

## 2017-09-11 DIAGNOSIS — M545 Low back pain: Secondary | ICD-10-CM | POA: Diagnosis not present

## 2017-09-11 DIAGNOSIS — M5127 Other intervertebral disc displacement, lumbosacral region: Secondary | ICD-10-CM | POA: Diagnosis not present

## 2017-11-19 ENCOUNTER — Encounter: Payer: Self-pay | Admitting: Family Medicine

## 2017-11-19 ENCOUNTER — Ambulatory Visit: Payer: BLUE CROSS/BLUE SHIELD | Admitting: Family Medicine

## 2017-11-19 VITALS — BP 135/82 | HR 67 | Temp 98.4°F | Resp 18 | Wt 191.6 lb

## 2017-11-19 DIAGNOSIS — R002 Palpitations: Secondary | ICD-10-CM | POA: Diagnosis not present

## 2017-11-19 DIAGNOSIS — I83892 Varicose veins of left lower extremities with other complications: Secondary | ICD-10-CM

## 2017-11-19 MED ORDER — NONFORMULARY OR COMPOUNDED ITEM: 0 refills | Status: AC

## 2017-11-19 NOTE — Progress Notes (Deleted)
Subjective:  I acted as a Education administrator for Dr. Curt Jews, RMA  Patient ID: Angela Garner, female    DOB: Jun 09, 1960, 57 y.o.   MRN: [id number]  Chief Complaint  Patient presents with  . Knee Pain    Left knee pain in the back. Possible vericose pain. Tried ibuprofen helped some.     HPI  Patient is in today for an acute visit for left knee pain. She denies any injury. States she thinks it could've been her varicose veins.    Patient Care Team: Carollee Herter, Alferd Apa, DO as PCP - General   Past Medical History:  Diagnosis Date  . Allergy   . Diabetes mellitus without complication (HCC)    no meds, only check bs  . Heart murmur   . Hyperlipidemia   . Hypertension     Past Surgical History:  Procedure Laterality Date  . UTERINE FIBROID SURGERY      Family History  Problem Relation Age of Onset  . Hypertension Father   . Heart disease Father   . Diabetes Father   . Dementia Mother   . Clotting disorder Brother   . Colon cancer Neg Hx   . Esophageal cancer Neg Hx   . Stomach cancer Neg Hx   . Rectal cancer Neg Hx     Social History   Socioeconomic History  . Marital status: Legally Separated    Spouse name: Not on file  . Number of children: Not on file  . Years of education: Not on file  . Highest education level: Not on file  Occupational History  . 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 Financial resource strain: Not on file  . Food insecurity:    Worry: Not on file    Inability: Not on file  . Transportation needs:    Medical: Not on file    Non-medical: Not on file  Tobacco Use  . Smoking status: Never Smoker  . Smokeless tobacco: Never Used  Substance and Sexual Activity  . Alcohol use: No  . Drug use: No  . Sexual activity: Yes  Lifestyle  . Physical activity:    Days per week: Not on file    Minutes per session: Not on file  . Stress: Not on file  Relationships  . Social connections:    Talks on phone:  Not on file    Gets together: Not on file    Attends religious service: Not on file    Active member of club or organization: Not on file    Attends meetings of clubs or organizations: Not on file    Relationship status: Not on file  . Intimate partner violence:    Fear of current or ex partner: Not on file    Emotionally abused: Not on file    Physically abused: Not on file    Forced sexual activity: Not on file  Other Topics Concern  . Not on file  Social History Narrative  . Not on file    Outpatient Medications Prior to Visit  Medication Sig Dispense Refill  . amLODipine (NORVASC) 5 MG tablet Take 1 tablet (5 mg total) by mouth daily. 30 tablet 3  . diclofenac (VOLTAREN) 75 MG EC tablet Take 75 mg by mouth 2 (two) times daily.    . fluticasone (FLONASE) 50 MCG/ACT nasal spray Place 2 sprays into both nostrils daily. 16 g 6  . gabapentin (NEURONTIN)  300 MG capsule Take 300 mg by mouth 3 (three) times daily.    Marland Kitchen glucose blood (ONE TOUCH TEST STRIPS) test strip Test Blood sugar twice a day 100 each 2  . loratadine (CLARITIN) 10 MG tablet Take 1 tablet (10 mg total) by mouth daily. 30 tablet 11  . mirabegron ER (MYRBETRIQ) 50 MG TB24 tablet Take 1 tablet (50 mg total) by mouth daily. 30 tablet 2  . multivitamin-iron-minerals-folic acid (CENTRUM) chewable tablet Chew 1 tablet by mouth daily.    Marland Kitchen olmesartan (BENICAR) 40 MG tablet TAKE 1 TABLET BY MOUTH ONCE DAILY 90 tablet 4  . ONETOUCH DELICA LANCETS FINE MISC 1 each by Does not apply route daily. 100 each 2  . simvastatin (ZOCOR) 20 MG tablet Take 1 tablet (20 mg total) by mouth at bedtime. 30 tablet 2   No facility-administered medications prior to visit.     Allergies  Allergen Reactions  . Pravachol [Pravastatin Sodium] Other (See Comments)    Muscle aches    ROS     Objective:    Physical Exam  There were no vitals taken for this visit. Wt Readings from Last 3 Encounters:  08/27/17 191 lb 6.4 oz (86.8 kg)    05/24/17 188 lb 6.4 oz (85.5 kg)  05/09/17 187 lb 6 oz (85 kg)   BP Readings from Last 3 Encounters:  08/27/17 (!) 156/106  05/24/17 138/90  05/09/17 120/80     Immunization History  Administered Date(s) Administered  . Influenza, Seasonal, Injecte, Preservative Fre 05/19/2012  . Influenza,inj,Quad PF,6+ Mos 01/07/2013, 03/09/2014, 01/14/2015, 01/10/2016, 02/21/2017  . Pneumococcal Polysaccharide-23 01/07/2013    Health Maintenance  Topic Date Due  . OPHTHALMOLOGY EXAM  05/09/2017  . INFLUENZA VACCINE  11/21/2017  . PNEUMOCOCCAL POLYSACCHARIDE VACCINE (2) 01/07/2018  . TETANUS/TDAP  02/03/2018  . MAMMOGRAM  02/11/2018  . HEMOGLOBIN A1C  02/27/2018  . FOOT EXAM  08/28/2018  . PAP SMEAR  12/27/2018  . COLONOSCOPY  05/12/2023  . Hepatitis C Screening  Completed  . HIV Screening  Completed    Lab Results  Component Value Date   WBC 3.8 (L) 02/21/2017   HGB 14.8 02/21/2017   HCT 44.4 02/21/2017   PLT 165.0 02/21/2017   GLUCOSE 135 (H) 08/27/2017   CHOL 205 (H) 08/27/2017   TRIG 77.0 08/27/2017   HDL 74.00 08/27/2017   LDLDIRECT 120.9 08/02/2011   LDLCALC 115 (H) 08/27/2017   ALT 16 08/27/2017   AST 18 08/27/2017   NA 142 08/27/2017   K 3.7 08/27/2017   CL 104 08/27/2017   CREATININE 0.72 08/27/2017   BUN 10 08/27/2017   CO2 30 08/27/2017   TSH 1.68 02/21/2017   HGBA1C 6.5 08/27/2017   MICROALBUR 1.9 02/21/2017    Lab Results  Component Value Date   TSH 1.68 02/21/2017   Lab Results  Component Value Date   WBC 3.8 (L) 02/21/2017   HGB 14.8 02/21/2017   HCT 44.4 02/21/2017   MCV 95.1 02/21/2017   PLT 165.0 02/21/2017   Lab Results  Component Value Date   NA 142 08/27/2017   K 3.7 08/27/2017   CO2 30 08/27/2017   GLUCOSE 135 (H) 08/27/2017   BUN 10 08/27/2017   CREATININE 0.72 08/27/2017   BILITOT 0.8 08/27/2017   ALKPHOS 67 08/27/2017   AST 18 08/27/2017   ALT 16 08/27/2017   PROT 7.2 08/27/2017   ALBUMIN 4.1 08/27/2017   CALCIUM 9.4  08/27/2017   GFR 107.03 08/27/2017  Lab Results  Component Value Date   CHOL 205 (H) 08/27/2017   Lab Results  Component Value Date   HDL 74.00 08/27/2017   Lab Results  Component Value Date   LDLCALC 115 (H) 08/27/2017   Lab Results  Component Value Date   TRIG 77.0 08/27/2017   Lab Results  Component Value Date   CHOLHDL 3 08/27/2017   Lab Results  Component Value Date   HGBA1C 6.5 08/27/2017         Assessment & Plan:   Problem List Items Addressed This Visit    None      I am having Shatyra A. Adom maintain her multivitamin-iron-minerals-folic acid, ONETOUCH DELICA LANCETS FINE, glucose blood, mirabegron ER, fluticasone, loratadine, olmesartan, diclofenac, gabapentin, amLODipine, and simvastatin.  No orders of the defined types were placed in this encounter.   {PROVIDER TO DELETE} Magdalene Molly, [place]

## 2017-11-19 NOTE — Progress Notes (Signed)
Patient ID: Angela Garner, female    DOB: 1960-09-25  Age: 57 y.o. MRN: [id number]    Subjective:  Subjective  HPI Angela Garner presents for c/o pain behind l knee.  Angela Garner thinks its her varicose veins.  No chest pain,   No sob.  + palpitations esp when laying down .    No calf pain.    Review of Systems  Constitutional: Negative for activity change, appetite change, fatigue and unexpected weight change.  Respiratory: Negative for cough and shortness of breath.   Cardiovascular: Positive for palpitations. Negative for chest pain.  Musculoskeletal: Positive for joint swelling.  Psychiatric/Behavioral: Negative for behavioral problems and dysphoric mood. The patient is not nervous/anxious.     History Past Medical History:  Diagnosis Date  . Allergy   . Diabetes mellitus without complication (HCC)    no meds, only check bs  . Heart murmur   . Hyperlipidemia   . Hypertension     Angela Garner has a past surgical history that includes Uterine fibroid surgery.   Her family history includes Clotting disorder in her brother; Dementia in her mother; Diabetes in her father; Heart disease in her father; Hypertension in her father.Angela Garner reports that Angela Garner has never smoked. Angela Garner has never used smokeless tobacco. Angela Garner reports that Angela Garner does not drink alcohol or use drugs.  Current Outpatient Medications on File Prior to Visit  Medication Sig Dispense Refill  . amLODipine (NORVASC) 5 MG tablet Take 1 tablet (5 mg total) by mouth daily. 30 tablet 3  . diclofenac (VOLTAREN) 75 MG EC tablet Take 75 mg by mouth 2 (two) times daily.    . fluticasone (FLONASE) 50 MCG/ACT nasal spray Place 2 sprays into both nostrils daily. 16 g 6  . gabapentin (NEURONTIN) 300 MG capsule Take 300 mg by mouth 3 (three) times daily.    Marland Kitchen glucose blood (ONE TOUCH TEST STRIPS) test strip Test Blood sugar twice a day 100 each 2  . loratadine (CLARITIN) 10 MG tablet Take 1 tablet (10 mg total) by mouth daily. 30 tablet 11  . mirabegron ER  (MYRBETRIQ) 50 MG TB24 tablet Take 1 tablet (50 mg total) by mouth daily. 30 tablet 2  . multivitamin-iron-minerals-folic acid (CENTRUM) chewable tablet Chew 1 tablet by mouth daily.    Marland Kitchen olmesartan (BENICAR) 40 MG tablet TAKE 1 TABLET BY MOUTH ONCE DAILY 90 tablet 4  . ONETOUCH DELICA LANCETS FINE MISC 1 each by Does not apply route daily. 100 each 2  . simvastatin (ZOCOR) 20 MG tablet Take 1 tablet (20 mg total) by mouth at bedtime. 30 tablet 2   No current facility-administered medications on file prior to visit.      Objective:  Objective  Physical Exam  Constitutional: Angela Garner is oriented to person, place, and time. Angela Garner appears well-developed and well-nourished.  HENT:  Head: Normocephalic and atraumatic.  Eyes: Conjunctivae and EOM are normal.  Neck: Normal range of motion. Neck supple. No JVD present. Carotid bruit is not present. No thyromegaly present.  Cardiovascular: Normal rate, regular rhythm and normal heart sounds.  No murmur heard. Pulmonary/Chest: Effort normal and breath sounds normal. No respiratory distress. Angela Garner has no wheezes. Angela Garner has no rales. Angela Garner exhibits no tenderness.  Musculoskeletal: Angela Garner exhibits tenderness. Angela Garner exhibits no edema.       Left lower leg: Angela Garner exhibits no tenderness.       Legs: Neurological: Angela Garner is alert and oriented to person, place, and time.  Psychiatric: Angela Garner has a normal  mood and affect.  Nursing note and vitals reviewed.  BP 135/82 (BP Location: Left Arm, Patient Position: Sitting, Cuff Size: Normal)   Pulse 67   Temp 98.4 F (36.9 C) (Oral)   Resp 18   Wt 191 lb 9.6 oz (86.9 kg)   SpO2 97%   BMI 29.13 kg/m  Wt Readings from Last 3 Encounters:  11/19/17 191 lb 9.6 oz (86.9 kg)  08/27/17 191 lb 6.4 oz (86.8 kg)  05/24/17 188 lb 6.4 oz (85.5 kg)     Lab Results  Component Value Date   WBC 3.8 (L) 02/21/2017   HGB 14.8 02/21/2017   HCT 44.4 02/21/2017   PLT 165.0 02/21/2017   GLUCOSE 135 (H) 08/27/2017   CHOL 205 (H) 08/27/2017     TRIG 77.0 08/27/2017   HDL 74.00 08/27/2017   LDLDIRECT 120.9 08/02/2011   LDLCALC 115 (H) 08/27/2017   ALT 16 08/27/2017   AST 18 08/27/2017   NA 142 08/27/2017   K 3.7 08/27/2017   CL 104 08/27/2017   CREATININE 0.72 08/27/2017   BUN 10 08/27/2017   CO2 30 08/27/2017   TSH 1.68 02/21/2017   HGBA1C 6.5 08/27/2017   MICROALBUR 1.9 02/21/2017    Dg Cervical Spine Complete  Result Date: 08/27/2017 CLINICAL DATA:  Chronic neck pain with left arm radiculopathy and numbness. EXAM: CERVICAL SPINE - COMPLETE 4+ VIEW COMPARISON:  None. FINDINGS: The lateral view is diagnostic to the C7 level. There is no acute fracture or subluxation. Vertebral body heights are preserved. Straightening of the normal cervical lordosis. Alignment is normal. Mild disc height loss, anterior endplate spurring, and uncovertebral hypertrophy at C5-C6. Remaining intervertebral disc spaces are maintained. Neural foramina are widely patent.Normal prevertebral soft tissues. IMPRESSION: Mild degenerative changes at C5-C6. Electronically Signed   By: Titus Dubin M.D.   On: 08/27/2017 15:49     Assessment & Plan:  Plan  I am having Angela Garner start on NONFORMULARY OR COMPOUNDED ITEM. I am also having her maintain her multivitamin-iron-minerals-folic acid, ONETOUCH DELICA LANCETS FINE, glucose blood, mirabegron ER, fluticasone, loratadine, olmesartan, diclofenac, gabapentin, amLODipine, and simvastatin.  Meds ordered this encounter  Medications  . NONFORMULARY OR COMPOUNDED ITEM    Sig: Compression stocking -- thigh high  20-30 mm/hg #1  Dx varicose veint    Dispense:  1 each    Refill:  0    Problem List Items Addressed This Visit    None    Visit Diagnoses    Symptomatic varicose veins, left    -  Primary   Relevant Medications   NONFORMULARY OR COMPOUNDED ITEM   Other Relevant Orders   US Venous Img Lower Unilateral Left   Ambulatory referral to Vascular Surgery   Palpitations       Relevant Orders    EKG 12-Lead (Completed)                       Pt did not want to do monitor or referral -- Angela Garner will call back if symptoms perist   Follow-up: Return if symptoms worsen or fail to improve.  [name], DO

## 2017-11-19 NOTE — Patient Instructions (Signed)
Varicose Veins Varicose veins are veins that have become enlarged and twisted. They are usually seen in the legs but can occur in other parts of the body as well. What are the causes? This condition is the result of valves in the veins not working properly. Valves in the veins help to return blood from the leg to the heart. If these valves are damaged, blood flows backward and backs up into the veins in the leg near the skin. This causes the veins to become larger. What increases the risk? People who are on their feet a lot, who are pregnant, or who are overweight are more likely to develop varicose veins. What are the signs or symptoms?  Bulging, twisted-appearing, bluish veins, most commonly found on the legs.  Leg pain or a feeling of heaviness. These symptoms may be worse at the end of the day.  Leg swelling.  Changes in skin color. How is this diagnosed? A health care provider can usually diagnose varicose veins by examining your legs. Your health care provider may also recommend an ultrasound of your leg veins. How is this treated? Most varicose veins can be treated at home.However, other treatments are available for people who have persistent symptoms or want to improve the cosmetic appearance of the varicose veins. These treatment options include:  Sclerotherapy. A solution is injected into the vein to close it off.  Laser treatment. A laser is used to heat the vein to close it off.  Radiofrequency vein ablation. An electrical current produced by radio waves is used to close off the vein.  Phlebectomy. The vein is surgically removed through small incisions made over the varicose vein.  Vein ligation and stripping. The vein is surgically removed through incisions made over the varicose vein after the vein has been tied (ligated). Follow these instructions at home:   Do not stand or sit in one position for long periods of time. Do not sit with your legs crossed. Rest with your  legs raised during the day.  Wear compression stockings as directed by your health care provider. These stockings help to prevent blood clots and reduce swelling in your legs.  Do not wear other tight, encircling garments around your legs, pelvis, or waist.  Walk as much as possible to increase blood flow.  Raise the foot of your bed at night with 2-inch blocks.  If you get a cut in the skin over the vein and the vein bleeds, lie down with your leg raised and press on it with a clean cloth until the bleeding stops. Then place a bandage (dressing) on the cut. See your health care provider if it continues to bleed. Contact a health care provider if:  The skin around your ankle starts to break down.  You have pain, redness, tenderness, or hard swelling in your leg over a vein.  You are uncomfortable because of leg pain. This information is not intended to replace advice given to you by your health care provider. Make sure you discuss any questions you have with your health care provider. Document Released: 01/17/2005 Document Revised: 09/15/2015 Document Reviewed: 10/11/2015 Elsevier Interactive Patient Education  2017 [employer]  

## 2017-12-10 ENCOUNTER — Other Ambulatory Visit: Payer: Self-pay | Admitting: *Deleted

## 2017-12-10 ENCOUNTER — Other Ambulatory Visit (INDEPENDENT_AMBULATORY_CARE_PROVIDER_SITE_OTHER): Payer: BLUE CROSS/BLUE SHIELD

## 2017-12-10 DIAGNOSIS — E1151 Type 2 diabetes mellitus with diabetic peripheral angiopathy without gangrene: Secondary | ICD-10-CM | POA: Diagnosis not present

## 2017-12-10 DIAGNOSIS — E785 Hyperlipidemia, unspecified: Secondary | ICD-10-CM

## 2017-12-10 LAB — COMPREHENSIVE METABOLIC PANEL
ALT: 32 U/L (ref 0–35)
AST: 30 U/L (ref 0–37)
Albumin: 4.3 g/dL (ref 3.5–5.2)
Alkaline Phosphatase: 74 U/L (ref 39–117)
BUN: 19 mg/dL (ref 6–23)
CO2: 30 mEq/L (ref 19–32)
Calcium: 9.6 mg/dL (ref 8.4–10.5)
Chloride: 106 mEq/L (ref 96–112)
Creatinine, Ser: 0.89 mg/dL (ref 0.40–1.20)
GFR: 83.72 mL/min (ref >60.00)
Glucose, Bld: 128 mg/dL — ABNORMAL HIGH (ref 70–99)
Potassium: 3.6 mEq/L (ref 3.5–5.1)
Sodium: 144 mEq/L (ref 135–145)
Total Bilirubin: 0.9 mg/dL (ref 0.2–1.2)
Total Protein: 7.1 g/dL (ref 6.0–8.3)

## 2017-12-10 LAB — LIPID PANEL
Cholesterol: 198 mg/dL (ref 0–200)
HDL: 72 mg/dL (ref >39.00)
LDL Cholesterol: 115 mg/dL — ABNORMAL HIGH (ref 0–99)
NonHDL: 126.22
Total CHOL/HDL Ratio: 3
Triglycerides: 58 mg/dL (ref 0.0–149.0)
VLDL: 11.6 mg/dL (ref 0.0–40.0)

## 2017-12-10 LAB — HEMOGLOBIN A1C: Hgb A1c MFr Bld: 6.4 % (ref 4.6–6.5)

## 2017-12-13 ENCOUNTER — Telehealth: Payer: Self-pay | Admitting: *Deleted

## 2017-12-13 DIAGNOSIS — E1151 Type 2 diabetes mellitus with diabetic peripheral angiopathy without gangrene: Secondary | ICD-10-CM

## 2017-12-13 DIAGNOSIS — E785 Hyperlipidemia, unspecified: Secondary | ICD-10-CM

## 2017-12-13 NOTE — Telephone Encounter (Signed)
Pt returned call and was notified of below results. Lab appt scheduled for 03/13/18 and future orders entered. Pt thinks that zocor is contributing to the leg pain she has been having. States she missed some doses and the pain was better. Is there another medication she can try for her cholesterol?

## 2017-12-13 NOTE — Telephone Encounter (Signed)
livalo 2 mg #30 1 po qhs, 2 refills---- she can have a discount card as well

## 2017-12-13 NOTE — Telephone Encounter (Signed)
-----   Message from [name], DO sent at 12/11/2017 11:39 AM EDT ----- Dm controlled Cholesterol--- LDL goal < 70,  HDL >40,  TG < 150.  Diet and exercise will increase HDL and decrease LDL and TG.  Fish,  Fish Oil, Flaxseed oil will also help increase the HDL and decrease Triglycerides.   Recheck labs in 3 months Increase zocor 40 mg #30 1 po qhs Lipid cmp, hgba1c.

## 2017-12-16 MED ORDER — PITAVASTATIN CALCIUM 2 MG PO TABS: 1.0000 | ORAL_TABLET | Freq: Every day | ORAL | 2 refills | Status: DC

## 2017-12-16 NOTE — Telephone Encounter (Signed)
Left detailed message on voicemail regarding below recommendation and that we do not currently have a discount card available in our office but pt may search for the discount card on the internet and let us know if she has any questions / concerns. Otherwise check with her pharmacy this evening for the new prescription and to stop the simvastatin.

## 2018-03-10 ENCOUNTER — Telehealth: Payer: Self-pay | Admitting: *Deleted

## 2018-03-10 NOTE — Telephone Encounter (Signed)
Copied from [place] [id number]. Topic: Appointment Scheduling - Scheduling Inquiry for Clinic >> Mar 10, 2018  8:51 AM Scherrie Gerlach wrote: Pt has fasting lab appt on 11/21, which is a repeat 3 mo lab order.  However, pt does not know why her cpe was not scheduled at that time. First CPE is 2 /2020.  Pt wants to know if she could come in this year for her cpe with Dr Etter Sjogren?

## 2018-03-11 NOTE — Telephone Encounter (Signed)
Left voicemail message we don't have any available times this year to schedule cpe with [facility] and if patient wants to see another provider that will be okay. I also saw she had upcoming appt with the lab so she can either make cpe appt on that day or call us back to schedule.

## 2018-03-11 NOTE — Telephone Encounter (Signed)
Can you check on this patient for cpe?

## 2018-03-12 DIAGNOSIS — Z01419 Encounter for gynecological examination (general) (routine) without abnormal findings: Secondary | ICD-10-CM | POA: Diagnosis not present

## 2018-03-12 DIAGNOSIS — Z1231 Encounter for screening mammogram for malignant neoplasm of breast: Secondary | ICD-10-CM | POA: Diagnosis not present

## 2018-03-12 DIAGNOSIS — Z6829 Body mass index (BMI) 29.0-29.9, adult: Secondary | ICD-10-CM | POA: Diagnosis not present

## 2018-03-13 ENCOUNTER — Other Ambulatory Visit: Payer: BLUE CROSS/BLUE SHIELD

## 2018-04-08 ENCOUNTER — Ambulatory Visit (INDEPENDENT_AMBULATORY_CARE_PROVIDER_SITE_OTHER): Payer: BLUE CROSS/BLUE SHIELD | Admitting: Family Medicine

## 2018-04-08 ENCOUNTER — Encounter: Payer: Self-pay | Admitting: Family Medicine

## 2018-04-08 VITALS — BP 142/100 | HR 63 | Temp 98.3°F | Ht 68.0 in | Wt 192.0 lb

## 2018-04-08 DIAGNOSIS — E1151 Type 2 diabetes mellitus with diabetic peripheral angiopathy without gangrene: Secondary | ICD-10-CM

## 2018-04-08 DIAGNOSIS — Z23 Encounter for immunization: Secondary | ICD-10-CM

## 2018-04-08 DIAGNOSIS — Z0000 Encounter for general adult medical examination without abnormal findings: Secondary | ICD-10-CM

## 2018-04-08 LAB — MICROALBUMIN / CREATININE URINE RATIO
Creatinine,U: 68.3 mg/dL
Microalb Creat Ratio: 1.5 mg/g (ref 0.0–30.0)
Microalb, Ur: 1 mg/dL (ref 0.0–1.9)

## 2018-04-08 NOTE — Addendum Note (Signed)
Addended by: Sharon Seller B on: 04/08/2018 08:49 AM   Modules accepted: Orders

## 2018-04-08 NOTE — Patient Instructions (Signed)
Follow up with Dr. Etter Sjogren as originally planned.  Keep the diet clean and stay active.  Let us know if you need anything.  Knee Exercises It is normal to feel mild stretching, pulling, tightness, or discomfort as you do these exercises, but you should stop right away if you feel sudden pain or your pain gets worse. STRETCHING AND RANGE OF MOTION EXERCISES  These exercises warm up your muscles and joints and improve the movement and flexibility of your knee. These exercises also help to relieve pain, numbness, and tingling. Exercise A: Knee Extension, Prone  1. Lie on your abdomen on a bed. 2. Place your left / right knee just beyond the edge of the surface so your knee is not on the bed. You can put a towel under your left / right thigh just above your knee for comfort. 3. Relax your leg muscles and allow gravity to straighten your knee. You should feel a stretch behind your left / right knee. 4. Hold this position for 30 seconds. 5. Scoot up so your knee is supported between repetitions. Repeat 2 times. Complete this stretch 3 times per week. Exercise B: Knee Flexion, Active    1. Lie on your back with both knees straight. If this causes back discomfort, bend your left / right knee so your foot is flat on the floor. 2. Slowly slide your left / right heel back toward your buttocks until you feel a gentle stretch in the front of your knee or thigh. 3. Hold this position for 30 seconds. 4. Slowly slide your left / right heel back to the starting position. Repeat 2 times. Complete this exercise 3 times per week. Exercise C: Quadriceps, Prone    1. Lie on your abdomen on a firm surface, such as a bed or padded floor. 2. Bend your left / right knee and hold your ankle. If you cannot reach your ankle or pant leg, loop a belt around your foot and grab the belt instead. 3. Gently pull your heel toward your buttocks. Your knee should not slide out to the side. You should feel a stretch in the  front of your thigh and knee. 4. Hold this position for 30 seconds. Repeat 2 times. Complete this stretch 3 times per week. Exercise D: Hamstring, Supine  1. Lie on your back. 2. Loop a belt or towel over the ball of your left / right foot. The ball of your foot is on the walking surface, right under your toes. 3. Straighten your left / right knee and slowly pull on the belt to raise your leg until you feel a gentle stretch behind your knee. ? Do not let your left / right knee bend while you do this. ? Keep your other leg flat on the floor. 4. Hold this position for 30 seconds. Repeat 2 times. Complete this stretch 3 times per week. STRENGTHENING EXERCISES  These exercises build strength and endurance in your knee. Endurance is the ability to use your muscles for a long time, even after they get tired. Exercise E: Quadriceps, Isometric    1. Lie on your back with your left / right leg extended and your other knee bent. Put a rolled towel or small pillow under your knee if told by your health care provider. 2. Slowly tense the muscles in the front of your left / right thigh. You should see your kneecap slide up toward your hip or see increased dimpling just above the knee. This motion will  push the back of the knee toward the floor. 3. For 3 seconds, keep the muscle as tight as you can without increasing your pain. 4. Relax the muscles slowly and completely. Repeat for 10 total reps Repeat 2 ti mes. Complete this exercise 3 times per week. Exercise F: Straight Leg Raises - Quadriceps  1. Lie on your back with your left / right leg extended and your other knee bent. 2. Tense the muscles in the front of your left / right thigh. You should see your kneecap slide up or see increased dimpling just above the knee. Your thigh may even shake a bit. 3. Keep these muscles tight as you raise your leg 4-6 inches (10-15 cm) off the floor. Do not let your knee bend. 4. Hold this position for 3  seconds. 5. Keep these muscles tense as you lower your leg. 6. Relax your muscles slowly and completely after each repetition. 10 total reps. Repeat 2 times. Complete this exercise 3 times per week.  Exercise G: Hamstring Curls    If told by your health care provider, do this exercise while wearing ankle weights. Begin with 5 lb weights (optional). Then increase the weight by 1 lb (0.5 kg) increments. Do not wear ankle weights that are more than 20 lbs to start with. 1. Lie on your abdomen with your legs straight. 2. Bend your left / right knee as far as you can without feeling pain. Keep your hips flat against the floor. 3. Hold this position for 3 seconds. 4. Slowly lower your leg to the starting position. Repeat for 10 reps.  Repeat 2 times. Complete this exercise 3 times per week. Exercise H: Squats (Quadriceps)  1. Stand in front of a table, with your feet and knees pointing straight ahead. You may rest your hands on the table for balance but not for support. 2. Slowly bend your knees and lower your hips like you are going to sit in a chair. ? Keep your weight over your heels, not over your toes. ? Keep your lower legs upright so they are parallel with the table legs. ? Do not let your hips go lower than your knees. ? Do not bend lower than told by your health care provider. ? If your knee pain increases, do not bend as low. 3. Hold the squat position for 1 second. 4. Slowly push with your legs to return to standing. Do not use your hands to pull yourself to standing. Repeat 2 times. Complete this exercise 3 times per week. Exercise I: Wall Slides (Quadriceps)    1. Lean your back against a smooth wall or door while you walk your feet out 18-24 inches (46-61 cm) from it. 2. Place your feet hip-width apart. 3. Slowly slide down the wall or door until your knees Repeat 2 times. Complete this exercise every other day. 4. Exercise K: Straight Leg Raises - Hip Abductors  1. Lie on  your side with your left / right leg in the top position. Lie so your head, shoulder, knee, and hip line up. You may bend your bottom knee to help you keep your balance. 2. Roll your hips slightly forward so your hips are stacked directly over each other and your left / right knee is facing forward. 3. Leading with your heel, lift your top leg 4-6 inches (10-15 cm). You should feel the muscles in your outer hip lifting. ? Do not let your foot drift forward. ? Do not let your knee  roll toward the ceiling. 4. Hold this position for 3 seconds. 5. Slowly return your leg to the starting position. 6. Let your muscles relax completely after each repetition. 10 total reps. Repeat 2 times. Complete this exercise 3 times per week. Exercise J: Straight Leg Raises - Hip Extensors  1. Lie on your abdomen on a firm surface. You can put a pillow under your hips if that is more comfortable. 2. Tense the muscles in your buttocks and lift your left / right leg about 4-6 inches (10-15 cm). Keep your knee straight as you lift your leg. 3. Hold this position for 3 seconds. 4. Slowly lower your leg to the starting position. 5. Let your leg relax completely after each repetition. Repeat 2 times. Complete this exercise 3 times per week. Document Released: 02/21/2005 Document Revised: 01/02/2016 Document Reviewed: 02/13/2015 Elsevier Interactive Patient Education  2017 [employer].

## 2018-04-08 NOTE — Progress Notes (Signed)
Chief Complaint  Patient presents with  . Annual Exam     Well Woman Angela Garner is here for a complete physical.   Her last physical was >1 year ago.  Current diet: in general, a "healthy" diet. Current exercise: Lifts wts, cardio sometimes. Weight is stable and she denies daytime fatigue. No LMP recorded. Patient is postmenopausal. Seatbelt? Yes  Health Maintenance Pap/HPV- Yes Mammogram- Yes Tetanus- Yes Hep C screening- Yes HIV screening- Yes  Past Medical History:  Diagnosis Date  . Allergy   . Diabetes mellitus without complication (HCC)    no meds, only check bs  . Heart murmur   . Hyperlipidemia   . Hypertension     Past Surgical History:  Procedure Laterality Date  . UTERINE FIBROID SURGERY     Medications  Current Outpatient Medications on File Prior to Visit  Medication Sig Dispense Refill  . amLODipine (NORVASC) 5 MG tablet Take 1 tablet (5 mg total) by mouth daily. 30 tablet 3  . diclofenac (VOLTAREN) 75 MG EC tablet Take 75 mg by mouth 2 (two) times daily.    . fluticasone (FLONASE) 50 MCG/ACT nasal spray Place 2 sprays into both nostrils daily. 16 g 6  . gabapentin (NEURONTIN) 300 MG capsule Take 300 mg by mouth 3 (three) times daily.    Marland Kitchen glucose blood (ONE TOUCH TEST STRIPS) test strip Test Blood sugar twice a day 100 each 2  . loratadine (CLARITIN) 10 MG tablet Take 1 tablet (10 mg total) by mouth daily. 30 tablet 11  . mirabegron ER (MYRBETRIQ) 50 MG TB24 tablet Take 1 tablet (50 mg total) by mouth daily. 30 tablet 2  . multivitamin-iron-minerals-folic acid (CENTRUM) chewable tablet Chew 1 tablet by mouth daily.    . NONFORMULARY OR COMPOUNDED ITEM Compression stocking -- thigh high  20-30 mm/hg #1  Dx varicose veint 1 each 0  . olmesartan (BENICAR) 40 MG tablet TAKE 1 TABLET BY MOUTH ONCE DAILY 90 tablet 4  . ONETOUCH DELICA LANCETS FINE MISC 1 each by Does not apply route daily. 100 each 2  . Pitavastatin Calcium (LIVALO) 2 MG TABS Take 1 tablet (2  mg total) by mouth at bedtime. 30 tablet 2   Allergies Allergies  Allergen Reactions  . Pravachol [Pravastatin Sodium] Other (See Comments)    Muscle aches  . Simvastatin     "leg pains"   Review of Systems: Constitutional:  no unexpected weight changes Eye:  no recent significant change in vision Ear/Nose/Mouth/Throat:  Ears:  no tinnitus or vertigo and no recent change in hearing Nose/Mouth/Throat:  no complaints of nasal congestion, no sore throat Cardiovascular: no chest pain Respiratory:  no cough and no shortness of breath Gastrointestinal:  no abdominal pain, no change in bowel habits GU:  Female: negative for dysuria or pelvic pain Musculoskeletal/Extremities: +chronic back pain; otherwise no pain of the joints Integumentary (Skin/Breast):  no abnormal skin lesions reported Neurologic:  no headaches Endocrine:  denies fatigue Hematologic/Lymphatic:  No areas of easy bleeding  Exam BP (!) 142/100 (BP Location: Left Arm, Patient Position: Sitting, Cuff Size: Large)   Pulse 63   Temp 98.3 F (36.8 C) (Oral)   Ht 5\' 8"  (1.727 m)   Wt 192 lb (87.1 kg)   SpO2 95%   BMI 29.19 kg/m  General:  well developed, well nourished, in no apparent distress Skin:  no significant moles, warts, or growths Head:  no masses, lesions, or tenderness Eyes:  pupils equal and round, sclera  anicteric without injection Ears:  canals without lesions, TMs shiny without retraction, no obvious effusion, no erythema Nose:  nares patent, septum midline, mucosa normal, and no drainage or sinus tenderness Throat/Pharynx:  lips and gingiva without lesion; tongue and uvula midline; non-inflamed pharynx; no exudates or postnasal drainage Neck: neck supple without adenopathy, thyromegaly, or masses Lungs:  clear to auscultation, breath sounds equal bilaterally, no respiratory distress Cardio:  regular rate and rhythm, no bruits, no LE edema Abdomen:  abdomen soft, nontender; bowel sounds normal; no  masses or organomegaly Genital: Defer to GYN Musculoskeletal:  symmetrical muscle groups noted without atrophy or deformity; +pain with R knee extension Extremities:  no clubbing, cyanosis, or edema, no deformities, no skin discoloration Neuro:  gait normal; deep tendon reflexes normal and symmetric Psych: well oriented with normal range of affect and appropriate judgment/insight  Assessment and Plan  Well adult exam  DM (diabetes mellitus) type II, controlled, with peripheral vascular disorder ([place]) - Plan: Microalbumin / creatinine urine ratio   Well 57 y.o. female. Counseled on diet and exercise. BP reck is still high. F/u in 2 weeks for nurse visit.  Other orders as above. PCP has ordered labs ahead of time.  Flu shot today. UTD w other HM.  Follow up as originally scheduled w Dr. Etter Garner. The patient voiced understanding and agreement to the plan.  [place], DO 04/08/18 8:32 AM

## 2018-04-08 NOTE — Progress Notes (Signed)
Pre visit review using our clinic review tool, if applicable. No additional management support is needed unless otherwise documented below in the visit note. 

## 2018-04-21 ENCOUNTER — Other Ambulatory Visit (INDEPENDENT_AMBULATORY_CARE_PROVIDER_SITE_OTHER): Payer: BLUE CROSS/BLUE SHIELD

## 2018-04-21 ENCOUNTER — Ambulatory Visit (INDEPENDENT_AMBULATORY_CARE_PROVIDER_SITE_OTHER): Payer: BLUE CROSS/BLUE SHIELD | Admitting: Family

## 2018-04-21 VITALS — BP 158/101 | HR 69

## 2018-04-21 DIAGNOSIS — I10 Essential (primary) hypertension: Secondary | ICD-10-CM

## 2018-04-21 DIAGNOSIS — E785 Hyperlipidemia, unspecified: Secondary | ICD-10-CM

## 2018-04-21 DIAGNOSIS — E1151 Type 2 diabetes mellitus with diabetic peripheral angiopathy without gangrene: Secondary | ICD-10-CM | POA: Diagnosis not present

## 2018-04-21 LAB — COMPREHENSIVE METABOLIC PANEL
ALT: 25 U/L (ref 0–35)
AST: 19 U/L (ref 0–37)
Albumin: 4.2 g/dL (ref 3.5–5.2)
Alkaline Phosphatase: 95 U/L (ref 39–117)
BUN: 11 mg/dL (ref 6–23)
CO2: 32 mEq/L (ref 19–32)
Calcium: 9.5 mg/dL (ref 8.4–10.5)
Chloride: 104 mEq/L (ref 96–112)
Creatinine, Ser: 0.79 mg/dL (ref 0.40–1.20)
GFR: 95.94 mL/min (ref >60.00)
Glucose, Bld: 126 mg/dL — ABNORMAL HIGH (ref 70–99)
Potassium: 4 mEq/L (ref 3.5–5.1)
Sodium: 141 mEq/L (ref 135–145)
Total Bilirubin: 0.7 mg/dL (ref 0.2–1.2)
Total Protein: 6.7 g/dL (ref 6.0–8.3)

## 2018-04-21 LAB — LIPID PANEL
Cholesterol: 148 mg/dL (ref 0–200)
HDL: 67 mg/dL (ref >39.00)
LDL Cholesterol: 71 mg/dL (ref 0–99)
NonHDL: 81.01
Total CHOL/HDL Ratio: 2
Triglycerides: 48 mg/dL (ref 0.0–149.0)
VLDL: 9.6 mg/dL (ref 0.0–40.0)

## 2018-04-21 LAB — HEMOGLOBIN A1C: Hgb A1c MFr Bld: 6.7 % — ABNORMAL HIGH (ref 4.6–6.5)

## 2018-04-21 NOTE — Progress Notes (Signed)
Note reviewed and agree.   Nance Pear NP

## 2018-04-21 NOTE — Progress Notes (Signed)
Pre visit review using our clinic tool,if applicable. No additional management support is needed unless otherwise documented below in the visit note.   Pt here for Blood pressure check per order from Dr. Deloris Ping.  Pt currently takes: Amlodipine 5 mg,Olmesartan 40 mg daily.   Pt reports compliance with medication.  BP today @ = 158/101 HR = 69  Pt advised per Debbrah Alar, NP to restart BP medication and return on Friday for BP check. Appointment scheduled .

## 2018-04-25 ENCOUNTER — Ambulatory Visit (INDEPENDENT_AMBULATORY_CARE_PROVIDER_SITE_OTHER): Payer: BLUE CROSS/BLUE SHIELD | Admitting: Family Medicine

## 2018-04-25 ENCOUNTER — Other Ambulatory Visit: Payer: Self-pay

## 2018-04-25 VITALS — BP 140/96 | HR 67 | Temp 97.8°F | Ht 68.0 in | Wt 188.4 lb

## 2018-04-25 DIAGNOSIS — I10 Essential (primary) hypertension: Secondary | ICD-10-CM | POA: Diagnosis not present

## 2018-04-25 MED ORDER — AMLODIPINE BESYLATE 5 MG PO TABS: 5.0000 mg | ORAL_TABLET | Freq: Every day | ORAL | 3 refills | Status: DC

## 2018-04-25 NOTE — Progress Notes (Signed)
Pre visit review using our clinic tool,if applicable. No additional management support is needed unless otherwise documented below in the visit note.   Pt here for Blood pressure check per order from Debbrah Alar, NP.   :   Pt reports compliance with medication states she only takes Olmesartan 40 mg. Amlodipine 5 mg ordered as well.    BP today  = 152/97 HR = 65  Pt advised per Dr. Carollee Herter to restart Amlodipine and see her in 2-3 weeks.

## 2018-04-25 NOTE — Progress Notes (Signed)
Angela Woolum R Lowne Chase, DO 

## 2018-04-29 ENCOUNTER — Other Ambulatory Visit: Payer: Self-pay | Admitting: *Deleted

## 2018-04-29 MED ORDER — METFORMIN HCL 500 MG PO TABS: 500.0000 mg | ORAL_TABLET | Freq: Every day | ORAL | 2 refills | Status: DC

## 2018-05-08 ENCOUNTER — Ambulatory Visit: Payer: BLUE CROSS/BLUE SHIELD | Admitting: Family Medicine

## 2018-05-08 ENCOUNTER — Encounter: Payer: Self-pay | Admitting: Family Medicine

## 2018-05-08 VITALS — BP 140/96 | HR 67 | Temp 97.8°F | Ht 68.0 in | Wt 188.4 lb

## 2018-05-08 DIAGNOSIS — I10 Essential (primary) hypertension: Secondary | ICD-10-CM | POA: Diagnosis not present

## 2018-05-08 MED ORDER — OLMESARTAN MEDOXOMIL 40 MG PO TABS: 40.0000 mg | ORAL_TABLET | Freq: Every day | ORAL | 3 refills | Status: DC

## 2018-05-08 NOTE — Assessment & Plan Note (Signed)
Well controlled, no changes to meds. Encouraged heart healthy diet such as the DASH diet and exercise as tolerated.  °

## 2018-05-08 NOTE — Addendum Note (Signed)
Addended by: Marrion Coy on: 05/08/2018 08:59 AM   Modules accepted: Orders

## 2018-05-08 NOTE — Patient Instructions (Signed)
DASH Eating Plan  DASH stands for "Dietary Approaches to Stop Hypertension." The DASH eating plan is a healthy eating plan that has been shown to reduce high blood pressure (hypertension). It may also reduce your risk for type 2 diabetes, heart disease, and stroke. The DASH eating plan may also help with weight loss.  What are tips for following this plan?    General guidelines   Avoid eating more than 2,300 mg (milligrams) of salt (sodium) a day. If you have hypertension, you may need to reduce your sodium intake to 1,500 mg a day.   Limit alcohol intake to no more than 1 drink a day for nonpregnant women and 2 drinks a day for men. One drink equals 12 oz of beer, 5 oz of wine, or 1 oz of hard liquor.   Work with your health care provider to maintain a healthy body weight or to lose weight. Ask what an ideal weight is for you.   Get at least 30 minutes of exercise that causes your heart to beat faster (aerobic exercise) most days of the week. Activities may include walking, swimming, or biking.   Work with your health care provider or diet and nutrition specialist (dietitian) to adjust your eating plan to your individual calorie needs.  Reading food labels     Check food labels for the amount of sodium per serving. Choose foods with less than 5 percent of the Daily Value of sodium. Generally, foods with less than 300 mg of sodium per serving fit into this eating plan.   To find whole grains, look for the word "whole" as the first word in the ingredient list.  Shopping   Buy products labeled as "low-sodium" or "no salt added."   Buy fresh foods. Avoid canned foods and premade or frozen meals.  Cooking   Avoid adding salt when cooking. Use salt-free seasonings or herbs instead of table salt or sea salt. Check with your health care provider or pharmacist before using salt substitutes.   Do not fry foods. Cook foods using healthy methods such as baking, boiling, grilling, and broiling instead.   Cook with  heart-healthy oils, such as olive, canola, soybean, or sunflower oil.  Meal planning   Eat a balanced diet that includes:  ? 5 or more servings of fruits and vegetables each day. At each meal, try to fill half of your plate with fruits and vegetables.  ? Up to 6-8 servings of whole grains each day.  ? Less than 6 oz of lean meat, poultry, or fish each day. A 3-oz serving of meat is about the same size as a deck of cards. One egg equals 1 oz.  ? 2 servings of low-fat dairy each day.  ? A serving of nuts, seeds, or beans 5 times each week.  ? Heart-healthy fats. Healthy fats called Omega-3 fatty acids are found in foods such as flaxseeds and coldwater fish, like sardines, salmon, and mackerel.   Limit how much you eat of the following:  ? Canned or prepackaged foods.  ? Food that is high in trans fat, such as fried foods.  ? Food that is high in saturated fat, such as fatty meat.  ? Sweets, desserts, sugary drinks, and other foods with added sugar.  ? Full-fat dairy products.   Do not salt foods before eating.   Try to eat at least 2 vegetarian meals each week.   Eat more home-cooked food and less restaurant, buffet, and fast food.     When eating at a restaurant, ask that your food be prepared with less salt or no salt, if possible.  What foods are recommended?  The items listed may not be a complete list. Talk with your dietitian about what dietary choices are best for you.  Grains  Whole-grain or whole-wheat bread. Whole-grain or whole-wheat pasta. Brown rice. Oatmeal. Quinoa. Bulgur. Whole-grain and low-sodium cereals. Pita bread. Low-fat, low-sodium crackers. Whole-wheat flour tortillas.  Vegetables  Fresh or frozen vegetables (raw, steamed, roasted, or grilled). Low-sodium or reduced-sodium tomato and vegetable juice. Low-sodium or reduced-sodium tomato sauce and tomato paste. Low-sodium or reduced-sodium canned vegetables.  Fruits  All fresh, dried, or frozen fruit. Canned fruit in natural juice (without  added sugar).  Meat and other protein foods  Skinless chicken or turkey. Ground chicken or turkey. Pork with fat trimmed off. Fish and seafood. Egg whites. Dried beans, peas, or lentils. Unsalted nuts, nut butters, and seeds. Unsalted canned beans. Lean cuts of beef with fat trimmed off. Low-sodium, lean deli meat.  Dairy  Low-fat (1%) or fat-free (skim) milk. Fat-free, low-fat, or reduced-fat cheeses. Nonfat, low-sodium ricotta or cottage cheese. Low-fat or nonfat yogurt. Low-fat, low-sodium cheese.  Fats and oils  Soft margarine without trans fats. Vegetable oil. Low-fat, reduced-fat, or light mayonnaise and salad dressings (reduced-sodium). Canola, safflower, olive, soybean, and sunflower oils. Avocado.  Seasoning and other foods  Herbs. Spices. Seasoning mixes without salt. Unsalted popcorn and pretzels. Fat-free sweets.  What foods are not recommended?  The items listed may not be a complete list. Talk with your dietitian about what dietary choices are best for you.  Grains  Baked goods made with fat, such as croissants, muffins, or some breads. Dry pasta or rice meal packs.  Vegetables  Creamed or fried vegetables. Vegetables in a cheese sauce. Regular canned vegetables (not low-sodium or reduced-sodium). Regular canned tomato sauce and paste (not low-sodium or reduced-sodium). Regular tomato and vegetable juice (not low-sodium or reduced-sodium). Pickles. Olives.  Fruits  Canned fruit in a light or heavy syrup. Fried fruit. Fruit in cream or butter sauce.  Meat and other protein foods  Fatty cuts of meat. Ribs. Fried meat. Bacon. Sausage. Bologna and other processed lunch meats. Salami. Fatback. Hotdogs. Bratwurst. Salted nuts and seeds. Canned beans with added salt. Canned or smoked fish. Whole eggs or egg yolks. Chicken or turkey with skin.  Dairy  Whole or 2% milk, cream, and half-and-half. Whole or full-fat cream cheese. Whole-fat or sweetened yogurt. Full-fat cheese. Nondairy creamers. Whipped toppings.  Processed cheese and cheese spreads.  Fats and oils  Butter. Stick margarine. Lard. Shortening. Ghee. Bacon fat. Tropical oils, such as coconut, palm kernel, or palm oil.  Seasoning and other foods  Salted popcorn and pretzels. Onion salt, garlic salt, seasoned salt, table salt, and sea salt. Worcestershire sauce. Tartar sauce. Barbecue sauce. Teriyaki sauce. Soy sauce, including reduced-sodium. Steak sauce. Canned and packaged gravies. Fish sauce. Oyster sauce. Cocktail sauce. Horseradish that you find on the shelf. Ketchup. Mustard. Meat flavorings and tenderizers. Bouillon cubes. Hot sauce and Tabasco sauce. Premade or packaged marinades. Premade or packaged taco seasonings. Relishes. Regular salad dressings.  Where to find more information:   National Heart, Lung, and Blood Institute: www.nhlbi.nih.gov   American Heart Association: www.heart.org  Summary   The DASH eating plan is a healthy eating plan that has been shown to reduce high blood pressure (hypertension). It may also reduce your risk for type 2 diabetes, heart disease, and stroke.   With the   DASH eating plan, you should limit salt (sodium) intake to 2,300 mg a day. If you have hypertension, you may need to reduce your sodium intake to 1,500 mg a day.   When on the DASH eating plan, aim to eat more fresh fruits and vegetables, whole grains, lean proteins, low-fat dairy, and heart-healthy fats.   Work with your health care provider or diet and nutrition specialist (dietitian) to adjust your eating plan to your individual calorie needs.  This information is not intended to replace advice given to you by your health care provider. Make sure you discuss any questions you have with your health care provider.  Document Released: 03/29/2011 Document Revised: 04/02/2016 Document Reviewed: 04/02/2016  Elsevier Interactive Patient Education  2019 [employer]

## 2018-05-08 NOTE — Progress Notes (Signed)
Patient ID: Angela Garner, female    DOB: [birth year]  Age: 58 y.o. MRN: [id number]    Subjective:  Subjective  HPI Angela Garner presents for bp f/u.  No complaints.    Review of Systems  Constitutional: Negative for appetite change, diaphoresis, fatigue and unexpected weight change.  Eyes: Negative for pain, redness and visual disturbance.  Respiratory: Negative for cough, chest tightness, shortness of breath and wheezing.   Cardiovascular: Negative for chest pain, palpitations and leg swelling.  Endocrine: Negative for cold intolerance, heat intolerance, polydipsia, polyphagia and polyuria.  Genitourinary: Negative for difficulty urinating, dysuria and frequency.  Neurological: Negative for dizziness, light-headedness, numbness and headaches.    History Past Medical History:  Diagnosis Date  . Allergy   . Diabetes mellitus without complication (HCC)    no meds, only check bs  . Heart murmur   . Hyperlipidemia   . Hypertension     She has a past surgical history that includes Uterine fibroid surgery.   Her family history includes Clotting disorder in her brother; Dementia in her mother; Diabetes in her father; Heart disease in her father; Hypertension in her father.She reports that she has never smoked. She has never used smokeless tobacco. She reports that she does not drink alcohol or use drugs.  Current Outpatient Medications on File Prior to Visit  Medication Sig Dispense Refill  . amLODipine (NORVASC) 5 MG tablet Take 1 tablet (5 mg total) by mouth daily. 90 tablet 3  . diclofenac (VOLTAREN) 75 MG EC tablet Take 75 mg by mouth 2 (two) times daily.    . fluticasone (FLONASE) 50 MCG/ACT nasal spray Place 2 sprays into both nostrils daily. 16 g 6  . gabapentin (NEURONTIN) 300 MG capsule Take 300 mg by mouth 3 (three) times daily.    Marland Kitchen glucose blood (ONE TOUCH TEST STRIPS) test strip Test Blood sugar twice a day 100 each 2  . loratadine (CLARITIN) 10 MG tablet Take 1 tablet (10 mg  total) by mouth daily. 30 tablet 11  . metFORMIN (GLUCOPHAGE) 500 MG tablet Take 1 tablet (500 mg total) by mouth daily. 30 tablet 2  . mirabegron ER (MYRBETRIQ) 50 MG TB24 tablet Take 1 tablet (50 mg total) by mouth daily. 30 tablet 2  . multivitamin-iron-minerals-folic acid (CENTRUM) chewable tablet Chew 1 tablet by mouth daily.    . NONFORMULARY OR COMPOUNDED ITEM Compression stocking -- thigh high  20-30 mm/hg #1  Dx varicose veint 1 each 0  . ONETOUCH DELICA LANCETS FINE MISC 1 each by Does not apply route daily. 100 each 2  . Pitavastatin Calcium (LIVALO) 2 MG TABS Take 1 tablet (2 mg total) by mouth at bedtime. (Patient not taking: Reported on 05/08/2018) 30 tablet 2   No current facility-administered medications on file prior to visit.      Objective:  Objective  Physical Exam Vitals signs and nursing note reviewed.  Constitutional:      Appearance: She is well-developed.  HENT:     Head: Normocephalic and atraumatic.  Eyes:     Conjunctiva/sclera: Conjunctivae normal.  Neck:     Musculoskeletal: Normal range of motion and neck supple.     Thyroid: No thyromegaly.     Vascular: No carotid bruit or JVD.  Cardiovascular:     Rate and Rhythm: Normal rate and regular rhythm.     Heart sounds: Murmur present.  Pulmonary:     Effort: Pulmonary effort is normal. No respiratory distress.  Breath sounds: Normal breath sounds. No wheezing or rales.  Chest:     Chest wall: No tenderness.  Neurological:     Mental Status: She is alert and oriented to person, place, and time.    BP (!) 140/96 (BP Location: Left Arm, Cuff Size: Normal)   Pulse 67   Temp 97.8 F (36.6 C) (Oral)   Ht 5\' 8"  (1.727 m)   Wt 188 lb 6.4 oz (85.5 kg)   SpO2 99%   BMI 28.65 kg/m  Wt Readings from Last 3 Encounters:  05/08/18 188 lb 6.4 oz (85.5 kg)  05/08/18 188 lb 6.4 oz (85.5 kg)  04/08/18 192 lb (87.1 kg)     Lab Results  Component Value Date   WBC 3.8 (L) 02/21/2017   HGB 14.8  02/21/2017   HCT 44.4 02/21/2017   PLT 165.0 02/21/2017   GLUCOSE 126 (H) 04/21/2018   CHOL 148 04/21/2018   TRIG 48.0 04/21/2018   HDL 67.00 04/21/2018   LDLDIRECT 120.9 08/02/2011   LDLCALC 71 04/21/2018   ALT 25 04/21/2018   AST 19 04/21/2018   NA 141 04/21/2018   K 4.0 04/21/2018   CL 104 04/21/2018   CREATININE 0.79 04/21/2018   BUN 11 04/21/2018   CO2 32 04/21/2018   TSH 1.68 02/21/2017   HGBA1C 6.7 (H) 04/21/2018   MICROALBUR 1.0 04/08/2018    Dg Cervical Spine Complete  Result Date: 08/27/2017 CLINICAL DATA:  Chronic neck pain with left arm radiculopathy and numbness. EXAM: CERVICAL SPINE - COMPLETE 4+ VIEW COMPARISON:  None. FINDINGS: The lateral view is diagnostic to the C7 level. There is no acute fracture or subluxation. Vertebral body heights are preserved. Straightening of the normal cervical lordosis. Alignment is normal. Mild disc height loss, anterior endplate spurring, and uncovertebral hypertrophy at C5-C6. Remaining intervertebral disc spaces are maintained. Neural foramina are widely patent.Normal prevertebral soft tissues. IMPRESSION: Mild degenerative changes at C5-C6. Electronically Signed   By: Titus Dubin M.D.   On: 08/27/2017 15:49     Assessment & Plan:  Plan  I have changed Loyalty A. Garner's olmesartan. I am also having her maintain her multivitamin-iron-minerals-folic acid, ONETOUCH DELICA LANCETS FINE, glucose blood, mirabegron ER, fluticasone, loratadine, diclofenac, gabapentin, NONFORMULARY OR COMPOUNDED ITEM, Pitavastatin Calcium, amLODipine, and metFORMIN.  Meds ordered this encounter  Medications  . olmesartan (BENICAR) 40 MG tablet    Sig: Take 1 tablet (40 mg total) by mouth daily.    Dispense:  90 tablet    Refill:  3    Problem List Items Addressed This Visit      Unprioritized   Essential hypertension - Primary    Well controlled, no changes to meds. Encouraged heart healthy diet such as the DASH diet and exercise as tolerated.        Relevant Medications   olmesartan (BENICAR) 40 MG tablet      Follow-up: Return in about 3 months (around 08/07/2018), or if symptoms worsen or fail to improve, for hypertension, hyperlipidemia, diabetes II.  Angela Held, DO

## 2018-07-28 ENCOUNTER — Other Ambulatory Visit: Payer: Self-pay

## 2018-07-28 ENCOUNTER — Ambulatory Visit (INDEPENDENT_AMBULATORY_CARE_PROVIDER_SITE_OTHER): Payer: BLUE CROSS/BLUE SHIELD | Admitting: Family Medicine

## 2018-07-28 ENCOUNTER — Encounter: Payer: Self-pay | Admitting: Family Medicine

## 2018-07-28 ENCOUNTER — Telehealth: Payer: Self-pay | Admitting: *Deleted

## 2018-07-28 DIAGNOSIS — I10 Essential (primary) hypertension: Secondary | ICD-10-CM | POA: Diagnosis not present

## 2018-07-28 DIAGNOSIS — E1165 Type 2 diabetes mellitus with hyperglycemia: Secondary | ICD-10-CM

## 2018-07-28 DIAGNOSIS — E1169 Type 2 diabetes mellitus with other specified complication: Secondary | ICD-10-CM | POA: Diagnosis not present

## 2018-07-28 DIAGNOSIS — E785 Hyperlipidemia, unspecified: Secondary | ICD-10-CM

## 2018-07-28 DIAGNOSIS — M5441 Lumbago with sciatica, right side: Secondary | ICD-10-CM

## 2018-07-28 MED ORDER — AMLODIPINE BESYLATE 5 MG PO TABS: 5.0000 mg | ORAL_TABLET | Freq: Every day | ORAL | 3 refills | Status: DC

## 2018-07-28 MED ORDER — DICLOFENAC SODIUM 75 MG PO TBEC: 75.0000 mg | DELAYED_RELEASE_TABLET | Freq: Two times a day (BID) | ORAL | 1 refills | Status: DC

## 2018-07-28 NOTE — Telephone Encounter (Signed)
Left message on machine for patient to call back for lab appointment and nurse visit for bp check this week.

## 2018-07-28 NOTE — Progress Notes (Signed)
Virtual Visit via Video Note  I connected with Angela Garner on 07/28/18 at  8:45 AM EDT by a video enabled telemedicine application and verified that I am speaking with the correct person using two identifiers.   I discussed the limitations of evaluation and management by telemedicine and the availability of in person appointments. The patient expressed understanding and agreed to proceed.  History of Present Illness: Pt is home needing f/u on cm , chol and bp.  Pt felt that the metformin was causing palpitations so she stopped it a week ago .  The palp did improve some.  She also c/o leg cramps and needs a refill on her diclofenac for her back pain.    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 Type 2 diabetes mellitus with hyperglycemia, unspecified whether long term insulin use (HCC) Pt stopped metformin due to palp-- check labs  - Hemoglobin A1c; Future - Comprehensive metabolic panel; Future  2. Hyperlipidemia associated with type 2 diabetes mellitus ([place]) Tolerating statin, encouraged heart healthy diet, avoid trans fats, minimize simple carbs and saturated fats. Increase exercise as tolerated - Lipid panel; Future - Comprehensive metabolic panel; Future  3.  Essential hypertension .running slightly high -- will bring pt in for labs and bp check  - Comprehensive metabolic panel; Future - amLODipine (NORVASC) 5 MG tablet; Take 1 tablet (5 mg total) by mouth daily.  Dispense: 90 tablet; Refill: 3  4. Acute right-sided low back pain with right-sided sciatica Stable con't prn voltaren  - diclofenac (VOLTAREN) 75 MG EC tablet; Take 1 tablet (75 mg total) by mouth 2 (two) times daily.  Dispense: 60 tablet; Refill: 1  5. Palpitations-- improved off metformin Encouraged pt to make ov if occurs again  Will check labs  [name], DO

## 2018-08-06 ENCOUNTER — Ambulatory Visit (INDEPENDENT_AMBULATORY_CARE_PROVIDER_SITE_OTHER): Payer: BLUE CROSS/BLUE SHIELD | Admitting: Family Medicine

## 2018-08-06 ENCOUNTER — Other Ambulatory Visit (INDEPENDENT_AMBULATORY_CARE_PROVIDER_SITE_OTHER): Payer: BLUE CROSS/BLUE SHIELD

## 2018-08-06 ENCOUNTER — Other Ambulatory Visit: Payer: Self-pay

## 2018-08-06 DIAGNOSIS — I10 Essential (primary) hypertension: Secondary | ICD-10-CM

## 2018-08-06 DIAGNOSIS — E785 Hyperlipidemia, unspecified: Secondary | ICD-10-CM

## 2018-08-06 DIAGNOSIS — E1169 Type 2 diabetes mellitus with other specified complication: Secondary | ICD-10-CM

## 2018-08-06 DIAGNOSIS — E1165 Type 2 diabetes mellitus with hyperglycemia: Secondary | ICD-10-CM

## 2018-08-06 LAB — LIPID PANEL
Cholesterol: 217 mg/dL — ABNORMAL HIGH (ref 0–200)
HDL: 78.7 mg/dL (ref >39.00)
LDL Cholesterol: 128 mg/dL — ABNORMAL HIGH (ref 0–99)
NonHDL: 138.13
Total CHOL/HDL Ratio: 3
Triglycerides: 51 mg/dL (ref 0.0–149.0)
VLDL: 10.2 mg/dL (ref 0.0–40.0)

## 2018-08-06 LAB — COMPREHENSIVE METABOLIC PANEL
ALT: 21 U/L (ref 0–35)
AST: 21 U/L (ref 0–37)
Albumin: 4.6 g/dL (ref 3.5–5.2)
Alkaline Phosphatase: 77 U/L (ref 39–117)
BUN: 10 mg/dL (ref 6–23)
CO2: 31 mEq/L (ref 19–32)
Calcium: 9.8 mg/dL (ref 8.4–10.5)
Chloride: 101 mEq/L (ref 96–112)
Creatinine, Ser: 0.73 mg/dL (ref 0.40–1.20)
GFR: 98.79 mL/min (ref >60.00)
Glucose, Bld: 144 mg/dL — ABNORMAL HIGH (ref 70–99)
Potassium: 3.4 mEq/L — ABNORMAL LOW (ref 3.5–5.1)
Sodium: 140 mEq/L (ref 135–145)
Total Bilirubin: 0.9 mg/dL (ref 0.2–1.2)
Total Protein: 7.5 g/dL (ref 6.0–8.3)

## 2018-08-06 LAB — HEMOGLOBIN A1C: Hgb A1c MFr Bld: 6.6 % — ABNORMAL HIGH (ref 4.6–6.5)

## 2018-08-06 NOTE — Progress Notes (Signed)
Pre visit review using our clinic tool,if applicable. No additional management support is needed unless otherwise documented below in the visit note.   Pt here for Blood pressure check per order from Dr. Carollee Herter dated 07/28/18 Virtual visit  Pt currently takes: Amlodipine 5 mg daily and Benicar 40 mg daily.    Pt reports compliance with medication. States she took Ibuprophen for back pain early this am. Patient had lab work today.  BP today  = 137/89 HR = 70  Pt advised per Dr. Nani Ravens to continue medication as ordered and return for visit with Dr. Carollee Herter in 1 month. Patient agreed.

## 2018-08-06 NOTE — Progress Notes (Signed)
Noted. Agree with above.  Odell, DO 08/06/18 9:44 AM

## 2018-08-07 ENCOUNTER — Other Ambulatory Visit: Payer: Self-pay | Admitting: Family Medicine

## 2018-08-07 DIAGNOSIS — E785 Hyperlipidemia, unspecified: Secondary | ICD-10-CM

## 2018-08-07 DIAGNOSIS — E1169 Type 2 diabetes mellitus with other specified complication: Secondary | ICD-10-CM

## 2018-08-07 DIAGNOSIS — E1165 Type 2 diabetes mellitus with hyperglycemia: Secondary | ICD-10-CM

## 2018-08-07 MED ORDER — PITAVASTATIN CALCIUM 4 MG PO TABS: 4.0000 mg | ORAL_TABLET | Freq: Every day | ORAL | 1 refills | Status: DC

## 2018-10-06 ENCOUNTER — Encounter: Payer: Self-pay | Admitting: Family Medicine

## 2018-10-06 ENCOUNTER — Other Ambulatory Visit: Payer: Self-pay

## 2018-10-06 ENCOUNTER — Ambulatory Visit (INDEPENDENT_AMBULATORY_CARE_PROVIDER_SITE_OTHER): Payer: BC Managed Care – PPO | Admitting: Family Medicine

## 2018-10-06 DIAGNOSIS — I10 Essential (primary) hypertension: Secondary | ICD-10-CM | POA: Diagnosis not present

## 2018-10-06 MED ORDER — AMLODIPINE BESYLATE 5 MG PO TABS: ORAL_TABLET | ORAL | 3 refills | Status: DC

## 2018-10-06 NOTE — Progress Notes (Signed)
Virtual Visit via Video Note  I connected with Angela Garner on 10/06/18 at  9:15 AM EDT by a video enabled telemedicine application and verified that I am speaking with the correct person using two identifiers.  Location: Patient: home Provider: home   I discussed the limitations of evaluation and management by telemedicine and the availability of in person appointments. The patient expressed understanding and agreed to proceed.  History of Present Illness: Pt is home and c/o elevated bp and back pain.  She is also having some palpitations   She is walking   It is getting better  Palpitations have eased also  Past Medical History:  Diagnosis Date  . Allergy   . Diabetes mellitus without complication (HCC)    no meds, only check bs  . Heart murmur   . Hyperlipidemia   . Hypertension    Current Outpatient Medications on File Prior to Visit  Medication Sig Dispense Refill  . amLODipine (NORVASC) 5 MG tablet Take 1 tablet (5 mg total) by mouth daily. 90 tablet 3  . diclofenac (VOLTAREN) 75 MG EC tablet Take 1 tablet (75 mg total) by mouth 2 (two) times daily. 60 tablet 1  . loratadine (CLARITIN) 10 MG tablet Take 1 tablet (10 mg total) by mouth daily. 30 tablet 11  . multivitamin-iron-minerals-folic acid (CENTRUM) chewable tablet Chew 1 tablet by mouth daily.    . NONFORMULARY OR COMPOUNDED ITEM Compression stocking -- thigh high  20-30 mm/hg #1  Dx varicose veint 1 each 0  . olmesartan (BENICAR) 40 MG tablet Take 1 tablet (40 mg total) by mouth daily. 90 tablet 3  . Pitavastatin Calcium (LIVALO) 4 MG TABS Take 1 tablet (4 mg total) by mouth at bedtime. 90 tablet 1   No current facility-administered medications on file prior to visit.     Observations/Objective: Today's Vitals   10/06/18 0837  BP: (!) 141/95  Pulse: 60  Temp: 98.5 F (36.9 C)  TempSrc: Oral   There is no height or weight on file to calculate BMI.  Repeat bp while we were on the phone--- 164/90  Assessment  and Plan: 1. Essential hypertension Increase norvasc to 10 mg ,  Con' benicar F/u 1-2 weeks or sooner prn   - amLODipine (NORVASC) 5 MG tablet; 2 po qd  Dispense: 180 tablet; Refill: 3  .Follow Up Instructions:    I discussed the assessment and treatment plan with the patient. The patient was provided an opportunity to ask questions and all were answered. The patient agreed with the plan and demonstrated an understanding of the instructions.   The patient was advised to call back or seek an in-person evaluation if the symptoms worsen or if the condition fails to improve as anticipated.  I provided 15 minutes of non-face-to-face time during this encounter.   [name], DO

## 2018-10-23 ENCOUNTER — Ambulatory Visit: Payer: BC Managed Care – PPO | Admitting: Family Medicine

## 2018-10-23 ENCOUNTER — Encounter: Payer: Self-pay | Admitting: Family Medicine

## 2018-10-23 ENCOUNTER — Other Ambulatory Visit: Payer: Self-pay

## 2018-10-23 VITALS — BP 128/85 | HR 66 | Temp 98.3°F | Resp 18 | Ht 68.0 in | Wt 187.2 lb

## 2018-10-23 DIAGNOSIS — E785 Hyperlipidemia, unspecified: Secondary | ICD-10-CM

## 2018-10-23 DIAGNOSIS — E1165 Type 2 diabetes mellitus with hyperglycemia: Secondary | ICD-10-CM

## 2018-10-23 DIAGNOSIS — I10 Essential (primary) hypertension: Secondary | ICD-10-CM | POA: Diagnosis not present

## 2018-10-23 DIAGNOSIS — N3941 Urge incontinence: Secondary | ICD-10-CM

## 2018-10-23 DIAGNOSIS — R350 Frequency of micturition: Secondary | ICD-10-CM | POA: Diagnosis not present

## 2018-10-23 DIAGNOSIS — E1169 Type 2 diabetes mellitus with other specified complication: Secondary | ICD-10-CM

## 2018-10-23 LAB — COMPREHENSIVE METABOLIC PANEL
ALT: 19 U/L (ref 0–35)
AST: 20 U/L (ref 0–37)
Albumin: 4.5 g/dL (ref 3.5–5.2)
Alkaline Phosphatase: 73 U/L (ref 39–117)
BUN: 11 mg/dL (ref 6–23)
CO2: 31 mEq/L (ref 19–32)
Calcium: 9.3 mg/dL (ref 8.4–10.5)
Chloride: 104 mEq/L (ref 96–112)
Creatinine, Ser: 0.79 mg/dL (ref 0.40–1.20)
GFR: 90.11 mL/min (ref >60.00)
Glucose, Bld: 126 mg/dL — ABNORMAL HIGH (ref 70–99)
Potassium: 3.7 mEq/L (ref 3.5–5.1)
Sodium: 140 mEq/L (ref 135–145)
Total Bilirubin: 0.7 mg/dL (ref 0.2–1.2)
Total Protein: 7 g/dL (ref 6.0–8.3)

## 2018-10-23 LAB — LIPID PANEL
Cholesterol: 148 mg/dL (ref 0–200)
HDL: 76.7 mg/dL (ref >39.00)
LDL Cholesterol: 62 mg/dL (ref 0–99)
NonHDL: 71.76
Total CHOL/HDL Ratio: 2
Triglycerides: 50 mg/dL (ref 0.0–149.0)
VLDL: 10 mg/dL (ref 0.0–40.0)

## 2018-10-23 LAB — MICROALBUMIN / CREATININE URINE RATIO
Creatinine,U: 225.3 mg/dL
Microalb Creat Ratio: 1.1 mg/g (ref 0.0–30.0)
Microalb, Ur: 2.4 mg/dL — ABNORMAL HIGH (ref 0.0–1.9)

## 2018-10-23 LAB — HEMOGLOBIN A1C: Hgb A1c MFr Bld: 6.6 % — ABNORMAL HIGH (ref 4.6–6.5)

## 2018-10-23 LAB — POC URINALSYSI DIPSTICK (AUTOMATED)
Blood, UA: NEGATIVE
Glucose, UA: NEGATIVE
Leukocytes, UA: NEGATIVE
Nitrite, UA: NEGATIVE
Protein, UA: POSITIVE — AB
Spec Grav, UA: <=1.005 — AB (ref 1.010–1.025)
Urobilinogen, UA: 1 E.U./dL
pH, UA: 8 (ref 5.0–8.0)

## 2018-10-23 MED ORDER — MIRABEGRON ER 50 MG PO TB24: 50.0000 mg | ORAL_TABLET | Freq: Every day | ORAL | 6 refills | Status: DC

## 2018-10-23 MED ORDER — HYDROCHLOROTHIAZIDE 25 MG PO TABS: 25.0000 mg | ORAL_TABLET | Freq: Every day | ORAL | 3 refills | Status: DC

## 2018-10-23 NOTE — Assessment & Plan Note (Signed)
Well controlled, no changes to meds. Encouraged heart healthy diet such as the DASH diet and exercise as tolerated.  °

## 2018-10-23 NOTE — Progress Notes (Signed)
Patient ID: Angela Garner, female    DOB: 04-02-1961  Age: 58 y.o. MRN: [id number]    Subjective:  Subjective  HPI Angela Garner presents for f/u bp    She is c/o urinary frequency and states she tried something for incontinence one time but it caused so much dry mouth etc she had to stop it.    Review of Systems  Constitutional: Negative for appetite change, diaphoresis, fatigue and unexpected weight change.  Eyes: Negative for pain, redness and visual disturbance.  Respiratory: Negative for cough, chest tightness, shortness of breath and wheezing.   Cardiovascular: Negative for chest pain, palpitations and leg swelling.  Endocrine: Negative for cold intolerance, heat intolerance, polydipsia, polyphagia and polyuria.  Genitourinary: Positive for flank pain, frequency and urgency. Negative for difficulty urinating and dysuria.  Neurological: Negative for dizziness, light-headedness, numbness and headaches.    History Past Medical History:  Diagnosis Date  . Allergy   . Diabetes mellitus without complication (HCC)    no meds, only check bs  . Heart murmur   . Hyperlipidemia   . Hypertension     She has a past surgical history that includes Uterine fibroid surgery.   Her family history includes Clotting disorder in her brother; Dementia in her mother; Diabetes in her father; Heart disease in her father; Hypertension in her father.She reports that she has never smoked. She has never used smokeless tobacco. She reports that she does not drink alcohol or use drugs.  Current Outpatient Medications on File Prior to Visit  Medication Sig Dispense Refill  . amLODipine (NORVASC) 5 MG tablet 2 po qd 180 tablet 3  . diclofenac (VOLTAREN) 75 MG EC tablet Take 1 tablet (75 mg total) by mouth 2 (two) times daily. 60 tablet 1  . loratadine (CLARITIN) 10 MG tablet Take 1 tablet (10 mg total) by mouth daily. 30 tablet 11  . multivitamin-iron-minerals-folic acid (CENTRUM) chewable tablet Chew 1 tablet  by mouth daily.    . NONFORMULARY OR COMPOUNDED ITEM Compression stocking -- thigh high  20-30 mm/hg #1  Dx varicose veint 1 each 0  . olmesartan (BENICAR) 40 MG tablet Take 1 tablet (40 mg total) by mouth daily. 90 tablet 3  . Pitavastatin Calcium (LIVALO) 4 MG TABS Take 1 tablet (4 mg total) by mouth at bedtime. 90 tablet 1   No current facility-administered medications on file prior to visit.      Objective:  Objective  Physical Exam Vitals signs and nursing note reviewed.  Constitutional:      Appearance: She is well-developed.  HENT:     Head: Normocephalic and atraumatic.  Eyes:     Conjunctiva/sclera: Conjunctivae normal.  Neck:     Musculoskeletal: Normal range of motion and neck supple.     Thyroid: No thyromegaly.     Vascular: No carotid bruit or JVD.  Cardiovascular:     Rate and Rhythm: Normal rate and regular rhythm.     Heart sounds: Normal heart sounds. No murmur.  Pulmonary:     Effort: Pulmonary effort is normal. No respiratory distress.     Breath sounds: Normal breath sounds. No wheezing or rales.  Chest:     Chest wall: No tenderness.  Neurological:     Mental Status: She is alert and oriented to person, place, and time.    BP 128/85   Pulse 66   Temp 98.3 F (36.8 C) (Oral)   Resp 18   Ht 5\' 8"  (1.727 m)  Wt 187 lb 3.2 oz (84.9 kg)   SpO2 100%   BMI 28.46 kg/m  Wt Readings from Last 3 Encounters:  10/23/18 187 lb 3.2 oz (84.9 kg)  05/08/18 188 lb 6.4 oz (85.5 kg)  05/08/18 188 lb 6.4 oz (85.5 kg)     Lab Results  Component Value Date   WBC 3.8 (L) 02/21/2017   HGB 14.8 02/21/2017   HCT 44.4 02/21/2017   PLT 165.0 02/21/2017   GLUCOSE 144 (H) 08/06/2018   CHOL 217 (H) 08/06/2018   TRIG 51.0 08/06/2018   HDL 78.70 08/06/2018   LDLDIRECT 120.9 08/02/2011   LDLCALC 128 (H) 08/06/2018   ALT 21 08/06/2018   AST 21 08/06/2018   NA 140 08/06/2018   K 3.4 (L) 08/06/2018   CL 101 08/06/2018   CREATININE 0.73 08/06/2018   BUN 10  08/06/2018   CO2 31 08/06/2018   TSH 1.68 02/21/2017   HGBA1C 6.6 (H) 08/06/2018   MICROALBUR 1.0 04/08/2018    Dg Cervical Spine Complete  Result Date: 08/27/2017 CLINICAL DATA:  Chronic neck pain with left arm radiculopathy and numbness. EXAM: CERVICAL SPINE - COMPLETE 4+ VIEW COMPARISON:  None. FINDINGS: The lateral view is diagnostic to the C7 level. There is no acute fracture or subluxation. Vertebral body heights are preserved. Straightening of the normal cervical lordosis. Alignment is normal. Mild disc height loss, anterior endplate spurring, and uncovertebral hypertrophy at C5-C6. Remaining intervertebral disc spaces are maintained. Neural foramina are widely patent.Normal prevertebral soft tissues. IMPRESSION: Mild degenerative changes at C5-C6. Electronically Signed   By: Titus Dubin M.D.   On: 08/27/2017 15:49     Assessment & Plan:  Plan  I have discontinued Reylynn A. Garner's hydrochlorothiazide. I am also having her start on mirabegron ER. Additionally, I am having her maintain her multivitamin-iron-minerals-folic acid, loratadine, NONFORMULARY OR COMPOUNDED ITEM, olmesartan, diclofenac, Pitavastatin Calcium, and amLODipine.  Meds ordered this encounter  Medications  . DISCONTD: hydrochlorothiazide (HYDRODIURIL) 25 MG tablet    Sig: Take 1 tablet (25 mg total) by mouth daily.    Dispense:  90 tablet    Refill:  3  . mirabegron ER (MYRBETRIQ) 50 MG TB24 tablet    Sig: Take 1 tablet (50 mg total) by mouth daily.    Dispense:  30 tablet    Refill:  6    Problem List Items Addressed This Visit      Unprioritized   Essential hypertension - Primary    Well controlled, no changes to meds. Encouraged heart healthy diet such as the DASH diet and exercise as tolerated.        Urinary frequency    Incontinence myrbetriq  Check urine culture and ancillary       Relevant Orders   POCT Urinalysis Dipstick (Automated) (Completed)   Urine cytology ancillary only([facility])    Other Visit Diagnoses    Urge incontinence of urine       Relevant Medications   mirabegron ER (MYRBETRIQ) 50 MG TB24 tablet   Controlled type 2 diabetes mellitus with hyperglycemia, without long-term current use of insulin ([place])       Hyperlipidemia associated with type 2 diabetes mellitus ([place])          Follow-up: Return in about 3 months (around 01/23/2019), or if symptoms worsen or fail to improve, for hypertension, hyperlipidemia.  [name], DO

## 2018-10-23 NOTE — Assessment & Plan Note (Signed)
Incontinence myrbetriq  Check urine culture and ancillary

## 2018-10-23 NOTE — Patient Instructions (Signed)

## 2018-10-26 ENCOUNTER — Other Ambulatory Visit: Payer: Self-pay | Admitting: Family Medicine

## 2018-10-26 DIAGNOSIS — I10 Essential (primary) hypertension: Secondary | ICD-10-CM

## 2018-10-26 DIAGNOSIS — E1169 Type 2 diabetes mellitus with other specified complication: Secondary | ICD-10-CM

## 2018-10-26 DIAGNOSIS — E785 Hyperlipidemia, unspecified: Secondary | ICD-10-CM

## 2018-10-26 DIAGNOSIS — E1165 Type 2 diabetes mellitus with hyperglycemia: Secondary | ICD-10-CM

## 2018-11-04 IMAGING — DX DG CERVICAL SPINE COMPLETE 4+V
7 series · 7 of 7 positions shown · non-contrast
Comparison: None.

CLINICAL DATA: Chronic neck pain with left arm radiculopathy and
numbness.

EXAM:
CERVICAL SPINE - COMPLETE 4+ VIEW

[c-spine lat]
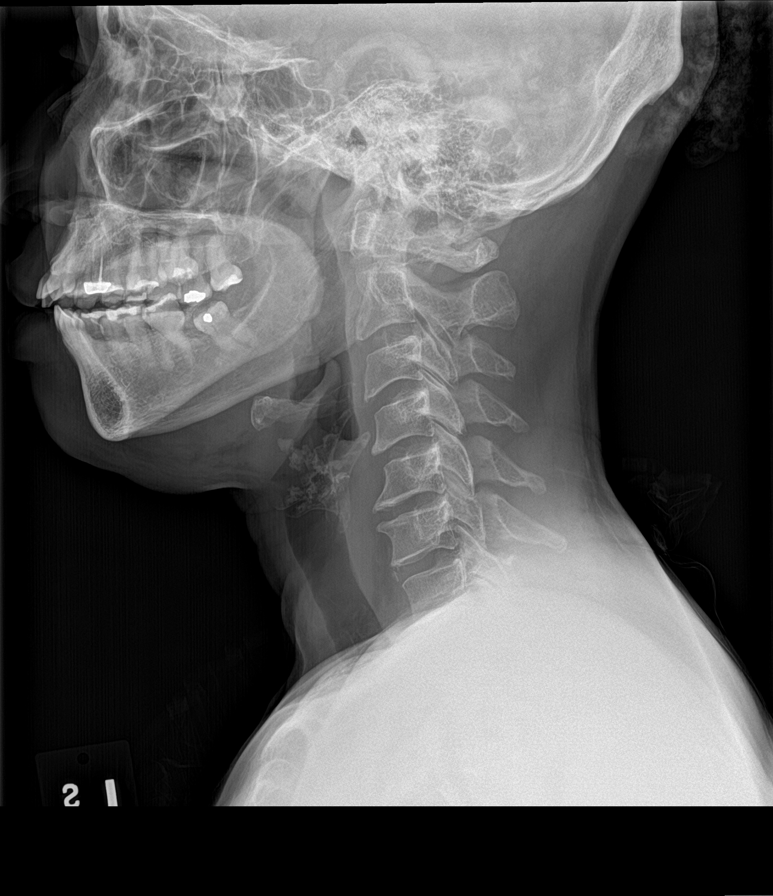

[c-spine obl (1 of 2)]
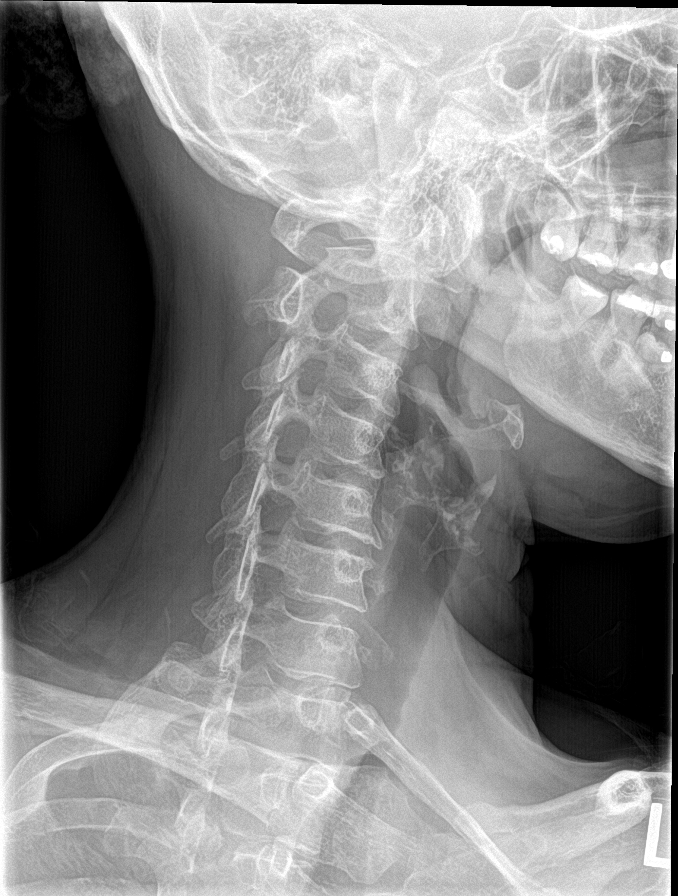

[c-spine obl (2 of 2)]
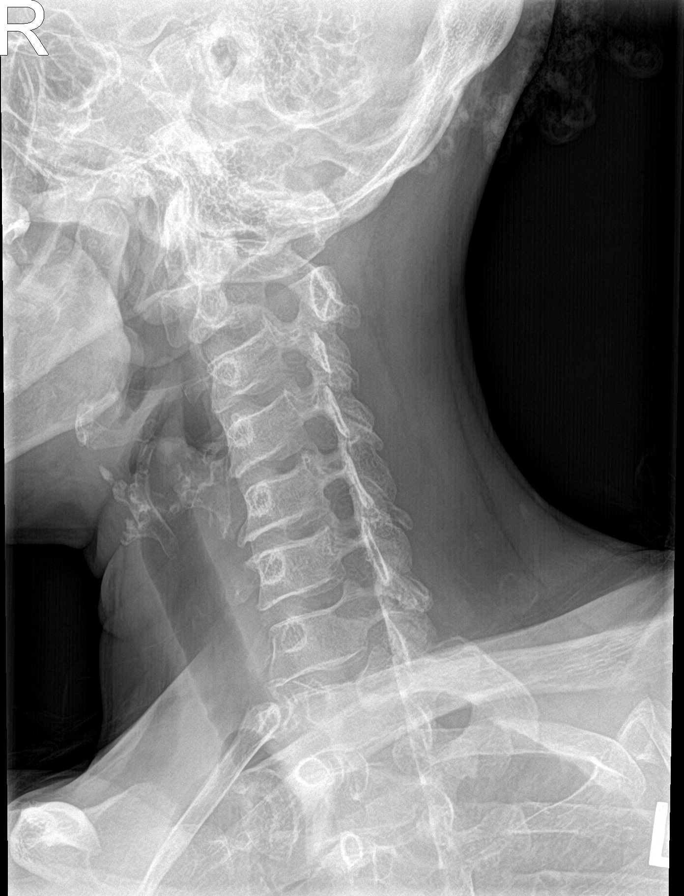

[c-spine ap]
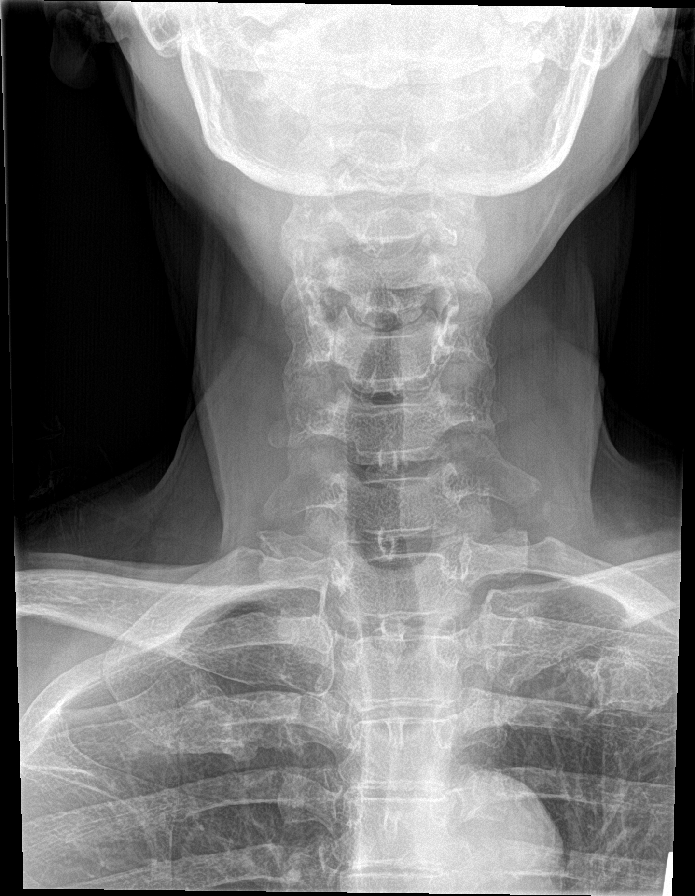

[c-spine open mouth]
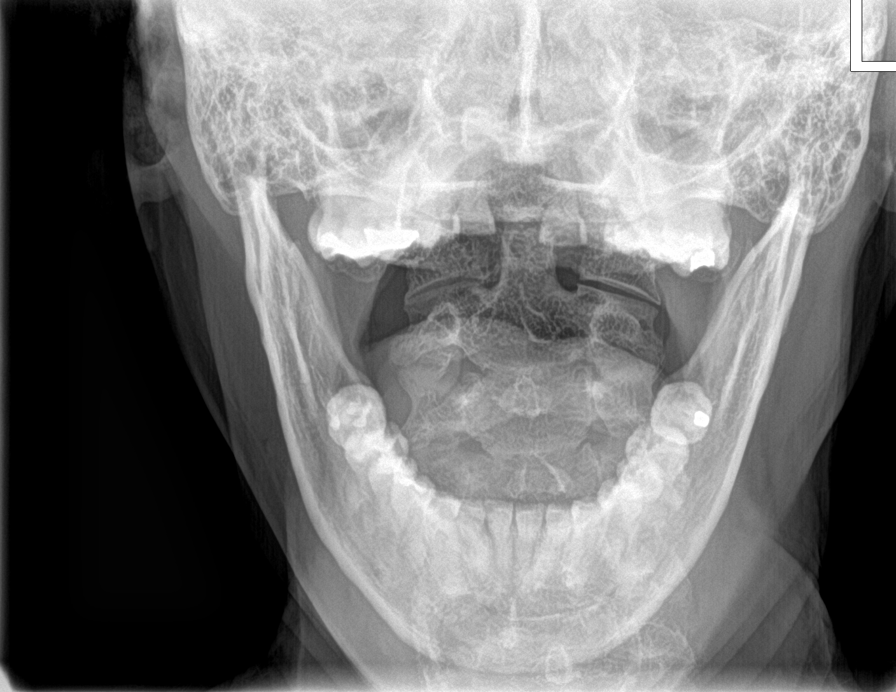

[c-spine swimmers]
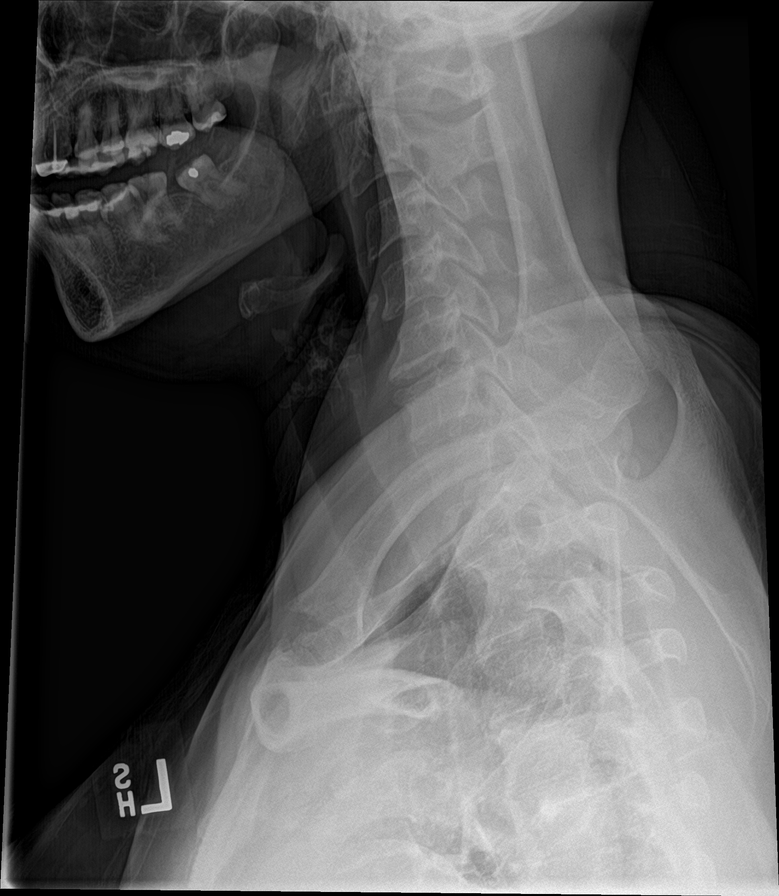

[[person_name]]
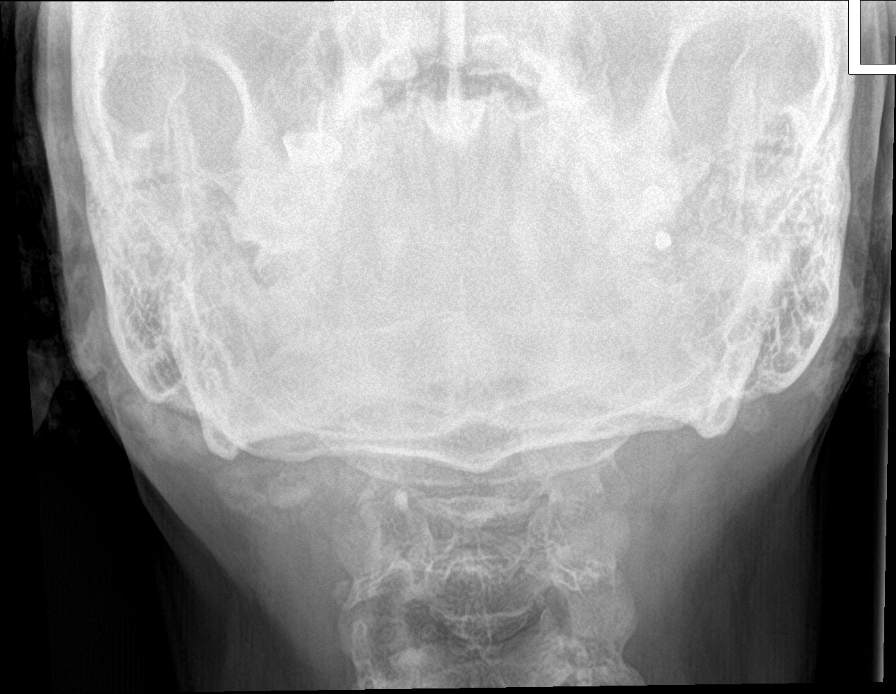

[7 of 7 positions shown; findings below may reference images not displayed]

FINDINGS: The lateral view is diagnostic to the C7 level. There is no acute
fracture or subluxation. Vertebral body heights are preserved.
Straightening of the normal cervical lordosis. Alignment is normal.
Mild disc height loss, anterior endplate spurring, and uncovertebral
hypertrophy at C5-C6. Remaining intervertebral disc spaces are
maintained. Neural foramina are widely patent.Normal prevertebral
soft tissues.
IMPRESSION: Mild degenerative changes at C5-C6.

## 2018-11-11 ENCOUNTER — Other Ambulatory Visit: Payer: BLUE CROSS/BLUE SHIELD

## 2018-11-11 ENCOUNTER — Other Ambulatory Visit: Payer: Self-pay

## 2018-11-11 ENCOUNTER — Ambulatory Visit: Payer: BC Managed Care – PPO | Admitting: Family Medicine

## 2018-11-11 ENCOUNTER — Encounter: Payer: Self-pay | Admitting: Family Medicine

## 2018-11-11 ENCOUNTER — Ambulatory Visit (HOSPITAL_BASED_OUTPATIENT_CLINIC_OR_DEPARTMENT_OTHER)
Admission: RE | Admit: 2018-11-11 | Discharge: 2018-11-11 | Disposition: A | Discharge status: Home / Self Care | Payer: BC Managed Care – PPO | Source: Ambulatory Visit | Attending: Family Medicine | Admitting: Family Medicine

## 2018-11-11 VITALS — BP 126/90 | HR 68 | Temp 98.6°F | Resp 18 | Ht 68.0 in | Wt 186.6 lb

## 2018-11-11 DIAGNOSIS — R350 Frequency of micturition: Secondary | ICD-10-CM | POA: Diagnosis not present

## 2018-11-11 DIAGNOSIS — I83813 Varicose veins of bilateral lower extremities with pain: Secondary | ICD-10-CM | POA: Diagnosis not present

## 2018-11-11 DIAGNOSIS — M25561 Pain in right knee: Secondary | ICD-10-CM | POA: Diagnosis not present

## 2018-11-11 DIAGNOSIS — R32 Unspecified urinary incontinence: Secondary | ICD-10-CM

## 2018-11-11 DIAGNOSIS — G8929 Other chronic pain: Secondary | ICD-10-CM

## 2018-11-11 HISTORY — DX: Other chronic pain: G89.29

## 2018-11-11 HISTORY — DX: Varicose veins of bilateral lower extremities with pain: I83.813

## 2018-11-11 HISTORY — DX: Unspecified urinary incontinence: R32

## 2018-11-11 LAB — POC URINALSYSI DIPSTICK (AUTOMATED)
Bilirubin, UA: NEGATIVE
Blood, UA: NEGATIVE
Glucose, UA: NEGATIVE
Ketones, UA: NEGATIVE
Leukocytes, UA: NEGATIVE
Nitrite, UA: NEGATIVE
Protein, UA: POSITIVE — AB
Spec Grav, UA: <=1.005 — AB (ref 1.010–1.025)
Urobilinogen, UA: 0.2 E.U./dL
pH, UA: 8 (ref 5.0–8.0)

## 2018-11-11 NOTE — Assessment & Plan Note (Signed)
Check xray Consider ortho

## 2018-11-11 NOTE — Assessment & Plan Note (Signed)
Wear compression hose Refer to vascular

## 2018-11-11 NOTE — Progress Notes (Signed)
Patient ID: Angela Garner, female    DOB: August 01, 1960  Age: 58 y.o. MRN: [id number]    Subjective:  Subjective  HPI Angela Garner presents for varicose veins in both legs -- she has been wearing compression stockings off and on but its hard in the heat Pt also c/o urinary frequency and the myrbetriq was too much.  She does not want another med but would like to see urology. She is also c/o R knee pain that has gotten worse.  No known injury symptoms for years   Review of Systems  Constitutional: Negative for appetite change, diaphoresis, fatigue and unexpected weight change.  Eyes: Negative for pain, redness and visual disturbance.  Respiratory: Negative for cough, chest tightness, shortness of breath and wheezing.   Cardiovascular: Negative for chest pain, palpitations and leg swelling.  Endocrine: Negative for cold intolerance, heat intolerance, polydipsia, polyphagia and polyuria.  Genitourinary: Positive for frequency. Negative for difficulty urinating and dysuria.  Musculoskeletal: Positive for gait problem and joint swelling.  Neurological: Negative for dizziness, light-headedness, numbness and headaches.    History Past Medical History:  Diagnosis Date  . Allergy   . Diabetes mellitus without complication (HCC)    no meds, only check bs  . Heart murmur   . Hyperlipidemia   . Hypertension     She has a past surgical history that includes Uterine fibroid surgery.   Her family history includes Clotting disorder in her brother; Dementia in her mother; Diabetes in her father; Heart disease in her father; Hypertension in her father.She reports that she has never smoked. She has never used smokeless tobacco. She reports that she does not drink alcohol or use drugs.  Current Outpatient Medications on File Prior to Visit  Medication Sig Dispense Refill  . amLODipine (NORVASC) 5 MG tablet 2 po qd 180 tablet 3  . diclofenac (VOLTAREN) 75 MG EC tablet Take 1 tablet (75 mg total) by mouth 2  (two) times daily. 60 tablet 1  . loratadine (CLARITIN) 10 MG tablet Take 1 tablet (10 mg total) by mouth daily. 30 tablet 11  . multivitamin-iron-minerals-folic acid (CENTRUM) chewable tablet Chew 1 tablet by mouth daily.    . NONFORMULARY OR COMPOUNDED ITEM Compression stocking -- thigh high  20-30 mm/hg #1  Dx varicose veint 1 each 0  . olmesartan (BENICAR) 40 MG tablet Take 1 tablet (40 mg total) by mouth daily. 90 tablet 3  . Pitavastatin Calcium (LIVALO) 4 MG TABS Take 1 tablet (4 mg total) by mouth at bedtime. 90 tablet 1   No current facility-administered medications on file prior to visit.      Objective:  Objective  Physical Exam Vitals signs and nursing note reviewed.  Constitutional:      Appearance: She is well-developed.  HENT:     Head: Normocephalic and atraumatic.  Eyes:     Conjunctiva/sclera: Conjunctivae normal.  Neck:     Musculoskeletal: Normal range of motion and neck supple.     Thyroid: No thyromegaly.     Vascular: No carotid bruit or JVD.  Cardiovascular:     Rate and Rhythm: Normal rate and regular rhythm.     Heart sounds: Normal heart sounds. No murmur.  Pulmonary:     Effort: Pulmonary effort is normal. No respiratory distress.     Breath sounds: Normal breath sounds. No wheezing or rales.  Chest:     Chest wall: No tenderness.  Musculoskeletal:     Right knee: She exhibits swelling. She  exhibits normal range of motion. Tenderness found. Lateral joint line tenderness noted.  Skin:      Neurological:     Mental Status: She is alert and oriented to person, place, and time.    BP 126/90 (BP Location: Left Arm, Patient Position: Sitting, Cuff Size: Normal)   Pulse 68   Temp 98.6 F (37 C) (Oral)   Resp 18   Ht 5\' 8"  (1.727 m)   Wt 186 lb 9.6 oz (84.6 kg)   SpO2 100%   BMI 28.37 kg/m  Wt Readings from Last 3 Encounters:  11/11/18 186 lb 9.6 oz (84.6 kg)  10/23/18 187 lb 3.2 oz (84.9 kg)  05/08/18 188 lb 6.4 oz (85.5 kg)     Lab  Results  Component Value Date   WBC 3.8 (L) 02/21/2017   HGB 14.8 02/21/2017   HCT 44.4 02/21/2017   PLT 165.0 02/21/2017   GLUCOSE 126 (H) 10/23/2018   CHOL 148 10/23/2018   TRIG 50.0 10/23/2018   HDL 76.70 10/23/2018   LDLDIRECT 120.9 08/02/2011   LDLCALC 62 10/23/2018   ALT 19 10/23/2018   AST 20 10/23/2018   NA 140 10/23/2018   K 3.7 10/23/2018   CL 104 10/23/2018   CREATININE 0.79 10/23/2018   BUN 11 10/23/2018   CO2 31 10/23/2018   TSH 1.68 02/21/2017   HGBA1C 6.6 (H) 10/23/2018   MICROALBUR 2.4 (H) 10/23/2018    Dg Cervical Spine Complete  Result Date: 08/27/2017 CLINICAL DATA:  Chronic neck pain with left arm radiculopathy and numbness. EXAM: CERVICAL SPINE - COMPLETE 4+ VIEW COMPARISON:  None. FINDINGS: The lateral view is diagnostic to the C7 level. There is no acute fracture or subluxation. Vertebral body heights are preserved. Straightening of the normal cervical lordosis. Alignment is normal. Mild disc height loss, anterior endplate spurring, and uncovertebral hypertrophy at C5-C6. Remaining intervertebral disc spaces are maintained. Neural foramina are widely patent.Normal prevertebral soft tissues. IMPRESSION: Mild degenerative changes at C5-C6. Electronically Signed   By: Titus Dubin M.D.   On: 08/27/2017 15:49     Assessment & Plan:  Plan            I have discontinued Angela Garner's mirabegron ER. I am also having her maintain her multivitamin-iron-minerals-folic acid, loratadine, NONFORMULARY OR COMPOUNDED ITEM, olmesartan, diclofenac, Pitavastatin Calcium, and amLODipine.  No orders of the defined types were placed in this encounter.   Problem List Items Addressed This Visit      Unprioritized   Chronic pain of right knee    Check xray Consider ortho      Relevant Orders   DG Knee Complete 4 Views Right   Urinary frequency   Relevant Orders   POCT Urinalysis Dipstick (Automated) (Completed)   Urinary incontinence   Relevant Orders    Ambulatory referral to Urology   Varicose 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 - Primary    Wear compression hose Refer to vascular       Relevant Orders   Ambulatory referral to Vascular Surgery      Follow-up: No follow-ups on file.  [name], DO

## 2018-11-17 DIAGNOSIS — R351 Nocturia: Secondary | ICD-10-CM | POA: Diagnosis not present

## 2018-11-17 DIAGNOSIS — N3281 Overactive bladder: Secondary | ICD-10-CM | POA: Diagnosis not present

## 2018-11-17 DIAGNOSIS — N952 Postmenopausal atrophic vaginitis: Secondary | ICD-10-CM | POA: Diagnosis not present

## 2018-11-17 DIAGNOSIS — R300 Dysuria: Secondary | ICD-10-CM | POA: Diagnosis not present

## 2018-12-22 ENCOUNTER — Encounter: Payer: Self-pay | Admitting: Internal Medicine

## 2018-12-22 ENCOUNTER — Ambulatory Visit: Payer: BC Managed Care – PPO | Admitting: Internal Medicine

## 2018-12-22 ENCOUNTER — Other Ambulatory Visit: Payer: Self-pay

## 2018-12-22 VITALS — BP 127/89 | HR 66 | Temp 97.4°F | Resp 16 | Ht 68.0 in | Wt 184.0 lb

## 2018-12-22 DIAGNOSIS — R599 Enlarged lymph nodes, unspecified: Secondary | ICD-10-CM | POA: Diagnosis not present

## 2018-12-22 DIAGNOSIS — I10 Essential (primary) hypertension: Secondary | ICD-10-CM

## 2018-12-22 NOTE — Patient Instructions (Signed)
  GO TO THE FRONT DESK Schedule your next appointment   to see your primary doctor in 2 months    Check the  blood pressure weekly BP GOAL is between 110/65 and  135/85. If it is consistently higher or lower, let me know

## 2018-12-22 NOTE — Progress Notes (Signed)
Subjective:    Patient ID: Angela Garner, female    DOB: 10/02/1960, 57 y.o.   MRN: [id number]  DOS:  12/22/2018 Type of visit - description: Acute Noted that a couple of lymph nodes at the submandibular area, one was noted a month ago and the other was 4 days ago.  They are slightly TTP.  Her BP was 157/97 few days ago, today is normal.     Wt Readings from Last 3 Encounters:  12/22/18 184 lb (83.5 kg)  11/11/18 186 lb 9.6 oz (84.6 kg)  10/23/18 187 lb 3.2 oz (84.9 kg)   BP Readings from Last 3 Encounters:  12/22/18 127/89  11/11/18 126/90  10/23/18 128/85    Review of Systems Denies any fever chills No recent URI symptoms such as runny nose, sore throat, sinus congestion. No cough or sputum production Weight has decreased a little, on purpose, she is eating healthier No night sweats  Past Medical History:  Diagnosis Date  . Allergy   . Diabetes mellitus without complication (HCC)    no meds, only check bs  . Heart murmur   . Hyperlipidemia   . Hypertension     Past Surgical History:  Procedure Laterality Date  . UTERINE FIBROID SURGERY      Social History   Socioeconomic History  . Marital status: Legally Separated    Spouse name: Not on file  . Number of children: Not on file  . Years of education: Not on file  . Highest education level: Not on file  Occupational History  . 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 Financial resource strain: Not on file  . Food insecurity    Worry: Not on file    Inability: Not on file  . Transportation needs    Medical: Not on file    Non-medical: Not on file  Tobacco Use  . Smoking status: Never Smoker  . Smokeless tobacco: Never Used  Substance and Sexual Activity  . Alcohol use: No  . Drug use: No  . Sexual activity: Yes  Lifestyle  . Physical activity    Days per week: Not on file    Minutes per session: Not on file  . Stress: Not on file  Relationships  . Social  Herbalist on phone: Not on file    Gets together: Not on file    Attends religious service: Not on file    Active member of club or organization: Not on file    Attends meetings of clubs or organizations: Not on file    Relationship status: Not on file  . Intimate partner violence    Fear of current or ex partner: Not on file    Emotionally abused: Not on file    Physically abused: Not on file    Forced sexual activity: Not on file  Other Topics Concern  . Not on file  Social History Narrative  . Not on file      Allergies as of 12/22/2018      Reactions   Metformin And Related    Pravachol [pravastatin Sodium] Other (See Comments)   Muscle aches   Simvastatin    "leg pains"      Medication List       Accurate as of December 22, 2018  1:17 PM. If you have any questions, ask your nurse or doctor.        amLODipine 5  MG tablet Commonly known as: NORVASC 2 po qd   diclofenac 75 MG EC tablet Commonly known as: VOLTAREN Take 1 tablet (75 mg total) by mouth 2 (two) times daily.   hydrochlorothiazide 25 MG tablet Commonly known as: HYDRODIURIL Take 25 mg by mouth daily.   loratadine 10 MG tablet Commonly known as: CLARITIN Take 1 tablet (10 mg total) by mouth daily.   multivitamin-iron-minerals-folic acid chewable tablet Chew 1 tablet by mouth daily.   NONFORMULARY OR COMPOUNDED ITEM Compression stocking -- thigh high  20-30 mm/hg #1  Dx varicose veint   olmesartan 40 MG tablet Commonly known as: BENICAR Take 1 tablet (40 mg total) by mouth daily.   Pitavastatin Calcium 4 MG Tabs Commonly known as: Livalo Take 1 tablet (4 mg total) by mouth at bedtime.   Yuvafem 10 MCG Tabs vaginal tablet Generic drug: Estradiol Place 1 tablet vaginally 2 (two) times a week.           Objective:   Physical Exam Neck:     BP 127/89 (BP Location: Left Arm, Patient Position: Sitting, Cuff Size: Small)   Pulse 66   Temp (!) 97.4 F (36.3 C) (Temporal)    Resp 16   Ht 5\' 8"  (1.727 m)   Wt 184 lb (83.5 kg)   SpO2 100%   BMI 27.98 kg/m  General:   Well developed, NAD, BMI noted. HEENT:  Normocephalic . Face symmetric, atraumatic. Mouth: Gums are normal to palpation with no obvious abscess or abnormality. Neck: No thyromegaly.  See graphic Axillary areas: No lumps or lymph nodes Lungs:  CTA B Normal respiratory effort, no intercostal retractions, no accessory muscle use. Heart: RRR,  no murmur.  No pretibial edema bilaterally  Skin: Not pale. Not jaundice Neurologic:  alert & oriented X3.  Speech normal, gait appropriate for age and unassisted Psych--  Cognition and judgment appear intact.  Cooperative with normal attention span and concentration.  Behavior appropriate. No anxious or depressed appearing.      Assessment    58 year old female, PMH includes HTN, high cholesterol, DM,  Lymphadenopathies, submandibular As described above,the patient never smoked, the 2 lymph nodes have benign features.  No obvious etiology, recommend observation.  RTC 2 months to be checked by PCP HTN: BP was elevated one time, recommend to continue monitoring.

## 2018-12-22 NOTE — Progress Notes (Signed)
Pre visit review using our clinic review tool, if applicable. No additional management support is needed unless otherwise documented below in the visit note. 

## 2018-12-30 DIAGNOSIS — N952 Postmenopausal atrophic vaginitis: Secondary | ICD-10-CM | POA: Diagnosis not present

## 2019-01-12 ENCOUNTER — Other Ambulatory Visit: Payer: Self-pay

## 2019-01-12 DIAGNOSIS — I83893 Varicose veins of bilateral lower extremities with other complications: Secondary | ICD-10-CM

## 2019-01-13 ENCOUNTER — Ambulatory Visit: Payer: BC Managed Care – PPO | Admitting: Vascular Surgery

## 2019-01-13 ENCOUNTER — Encounter: Payer: Self-pay | Admitting: Vascular Surgery

## 2019-01-13 ENCOUNTER — Ambulatory Visit (HOSPITAL_COMMUNITY)
Admission: RE | Admit: 2019-01-13 | Discharge: 2019-01-13 | Disposition: A | Discharge status: Home / Self Care | Payer: BC Managed Care – PPO | Source: Ambulatory Visit | Attending: Vascular Surgery | Admitting: Vascular Surgery

## 2019-01-13 ENCOUNTER — Other Ambulatory Visit: Payer: Self-pay

## 2019-01-13 DIAGNOSIS — I872 Venous insufficiency (chronic) (peripheral): Secondary | ICD-10-CM | POA: Diagnosis not present

## 2019-01-13 DIAGNOSIS — I83893 Varicose veins of bilateral lower extremities with other complications: Secondary | ICD-10-CM

## 2019-01-13 HISTORY — DX: Venous insufficiency (chronic) (peripheral): I87.2

## 2019-01-13 NOTE — Progress Notes (Signed)
Patient name: Angela Garner MRN: [id number] DOB: [birth year] Sex: female  REASON FOR CONSULT: Evaluate varicose veins with pain  HPI: Angela Garner is a 58 y.o. female, with history of hypertension hyperlipidemia that presents for evaluation of varicose veins with pain.  Patient states she noticed varicosities to both lower extremities for years.  She has no pain in her left lower extremity.  Specifically has been having pain in her right lower extremity for the last 3 years.  Mostly this is due to a patch of varicosities on her lateral knee on the right side.  She does wear compression and states wears thigh-high and knee-high at other times.  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  No history of DVT.  No history of venous interventions.  Past Medical History:  Diagnosis Date  . Allergy   . Diabetes mellitus without complication (HCC)    no meds, only check bs  . Heart murmur   . Hyperlipidemia   . Hypertension     Past Surgical History:  Procedure Laterality Date  . UTERINE FIBROID SURGERY      Family History  Problem Relation Age of Onset  . Hypertension Father   . Heart disease Father   . Diabetes Father   . Dementia Mother   . Clotting disorder Brother   . Colon cancer Neg Hx   . Esophageal cancer Neg Hx   . Stomach cancer Neg Hx   . Rectal cancer Neg Hx     SOCIAL HISTORY: Social History   Socioeconomic History  . Marital status: Legally Separated    Spouse name: Not on file  . Number of children: Not on file  . Years of education: Not on file  . Highest education level: Not on file  Occupational History  . 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 Financial resource strain: Not on file  . Food insecurity    Worry: Not on file    Inability: Not on file  . Transportation needs    Medical: Not on file    Non-medical: Not on file  Tobacco Use  . Smoking status: Never Smoker  .  Smokeless tobacco: Never Used  Substance and Sexual Activity  . Alcohol use: No  . Drug use: No  . Sexual activity: Yes  Lifestyle  . Physical activity    Days per week: Not on file    Minutes per session: Not on file  . Stress: Not on file  Relationships  . Social Herbalist on phone: Not on file    Gets together: Not on file    Attends religious service: Not on file    Active member of club or organization: Not on file    Attends meetings of clubs or organizations: Not on file    Relationship status: Not on file  . Intimate partner violence    Fear of current or ex partner: Not on file    Emotionally abused: Not on file    Physically abused: Not on file    Forced sexual activity: Not on file  Other Topics Concern  . Not on file  Social History Narrative  . Not on file    Allergies  Allergen Reactions  . Metformin And Related   . Pravachol [Pravastatin Sodium] Other (See Comments)    Muscle aches  . Simvastatin     "  leg pains"    Current Outpatient Medications  Medication Sig Dispense Refill  . amLODipine (NORVASC) 5 MG tablet 2 po qd 180 tablet 3  . diclofenac (VOLTAREN) 75 MG EC tablet Take 1 tablet (75 mg total) by mouth 2 (two) times daily. 60 tablet 1  . hydrochlorothiazide (HYDRODIURIL) 25 MG tablet Take 25 mg by mouth daily.    Marland Kitchen loratadine (CLARITIN) 10 MG tablet Take 1 tablet (10 mg total) by mouth daily. 30 tablet 11  . multivitamin-iron-minerals-folic acid (CENTRUM) chewable tablet Chew 1 tablet by mouth daily.    . NONFORMULARY OR COMPOUNDED ITEM Compression stocking -- thigh high  20-30 mm/hg #1  Dx varicose veint 1 each 0  . olmesartan (BENICAR) 40 MG tablet Take 1 tablet (40 mg total) by mouth daily. 90 tablet 3  . Pitavastatin Calcium (LIVALO) 4 MG TABS Take 1 tablet (4 mg total) by mouth at bedtime. 90 tablet 1   No current facility-administered medications for this visit.     REVIEW OF SYSTEMS:  [X]  denotes positive finding, [ ]   denotes negative finding Cardiac  Comments:  Chest pain or chest pressure:    Shortness of breath upon exertion:    Short of breath when lying flat:    Irregular heart rhythm:        Vascular    Pain in calf, thigh, or hip brought on by ambulation:    Pain in feet at night that wakes you up from your sleep:     Blood clot in your veins:    Leg swelling:         Pulmonary    Oxygen at home:    Productive cough:     Wheezing:         Neurologic    Sudden weakness in arms or legs:     Sudden numbness in arms or legs:     Sudden onset of difficulty speaking or slurred speech:    Temporary loss of vision in one eye:     Problems with dizziness:         Gastrointestinal    Blood in stool:     Vomited blood:         Genitourinary    Burning when urinating:     Blood in urine:        Psychiatric    Major depression:         Hematologic    Bleeding problems:    Problems with blood clotting too easily:        Skin    Rashes or ulcers:        Constitutional    Fever or chills:      PHYSICAL EXAM: Vitals:   01/13/19 1347  BP: 119/83  Pulse: 83  Resp: 14  Temp: 97.8 F (36.6 C)  TempSrc: Temporal  SpO2: 97%  Weight: 184 lb (83.5 kg)  Height: 5\' 8"  (1.727 m)    GENERAL: The patient is a well-nourished female, in no acute distress. The vital signs are documented above. CARDIAC: There is a regular rate and rhythm.  VASCULAR:  2+ palpable radial pulse bilaterally 2+ palpable femoral pulse bilaterally 2+ palpable dorsalis pedis pulse bilaterally Large patch of spider and varicose veins lateral right knee Other sporadic spider veins along both thighs PULMONARY: There is good air exchange bilaterally without wheezing or rales. ABDOMEN: Soft and non-tender with normal pitched bowel sounds.  MUSCULOSKELETAL: There are no major deformities or cyanosis. NEUROLOGIC: No focal weakness  or paresthesias are detected. SKIN: There are no ulcers or rashes noted.   DATA:    Lower extremity venous reflux study shows abnormal reflux times at the right greater saphenofemoral junction (focal segment) and the left great saphenous vein in the proximal calf.  No evidence of DVT  Assessment/Plan:  58 year old female presents for evaluation of bilateral lower extremity varicose veins.  I have discussed in detail that since she has no symptoms in her left lower extremity there is no role for intervention at this time.  All of her symptoms are in the right leg and she only has a focal area of superficial reflux at the saphenofemoral junction on duplex and would not qualify for endovenous ablation given the short segment.  However I did discuss that we could offer her sclerotherapy of this lateral patch on her right knee where she has a cluster of spider/varicose veins that are botherwsome.  Discussed that this would likely not be covered by insurance company and would be an out-of-pocket expense.  She would like to think about this and will call to schedule.  In the meantime I will keep her in thigh-high compression to both of her lower extremities.  Discussed that we could always reimage her in the future to see if her reflux progresses and she would then qualify for more aggressive intervention.   Marty Heck, MD [facility] Office: [phone] Pager: [phone]

## 2019-01-19 ENCOUNTER — Other Ambulatory Visit: Payer: Self-pay

## 2019-01-19 ENCOUNTER — Ambulatory Visit (HOSPITAL_BASED_OUTPATIENT_CLINIC_OR_DEPARTMENT_OTHER)
Admission: RE | Admit: 2019-01-19 | Discharge: 2019-01-19 | Disposition: A | Discharge status: Home / Self Care | Payer: BC Managed Care – PPO | Source: Ambulatory Visit | Attending: Family Medicine | Admitting: Family Medicine

## 2019-01-19 ENCOUNTER — Encounter: Payer: Self-pay | Admitting: Family Medicine

## 2019-01-19 ENCOUNTER — Ambulatory Visit: Payer: BC Managed Care – PPO | Admitting: Family Medicine

## 2019-01-19 VITALS — BP 120/80 | HR 62 | Temp 97.6°F | Resp 18 | Ht 68.0 in | Wt 180.0 lb

## 2019-01-19 DIAGNOSIS — Z23 Encounter for immunization: Secondary | ICD-10-CM | POA: Diagnosis not present

## 2019-01-19 DIAGNOSIS — R599 Enlarged lymph nodes, unspecified: Secondary | ICD-10-CM

## 2019-01-19 DIAGNOSIS — E1165 Type 2 diabetes mellitus with hyperglycemia: Secondary | ICD-10-CM

## 2019-01-19 DIAGNOSIS — E1169 Type 2 diabetes mellitus with other specified complication: Secondary | ICD-10-CM | POA: Diagnosis not present

## 2019-01-19 DIAGNOSIS — E1151 Type 2 diabetes mellitus with diabetic peripheral angiopathy without gangrene: Secondary | ICD-10-CM

## 2019-01-19 DIAGNOSIS — I10 Essential (primary) hypertension: Secondary | ICD-10-CM | POA: Diagnosis not present

## 2019-01-19 DIAGNOSIS — R221 Localized swelling, mass and lump, neck: Secondary | ICD-10-CM | POA: Diagnosis not present

## 2019-01-19 DIAGNOSIS — E785 Hyperlipidemia, unspecified: Secondary | ICD-10-CM | POA: Diagnosis not present

## 2019-01-19 LAB — LIPID PANEL
Cholesterol: 143 mg/dL (ref 0–200)
HDL: 79.1 mg/dL (ref >39.00)
LDL Cholesterol: 55 mg/dL (ref 0–99)
NonHDL: 63.78
Total CHOL/HDL Ratio: 2
Triglycerides: 42 mg/dL (ref 0.0–149.0)
VLDL: 8.4 mg/dL (ref 0.0–40.0)

## 2019-01-19 LAB — MICROALBUMIN / CREATININE URINE RATIO
Creatinine,U: 139.5 mg/dL
Microalb Creat Ratio: 5.4 mg/g (ref 0.0–30.0)
Microalb, Ur: 7.5 mg/dL — ABNORMAL HIGH (ref 0.0–1.9)

## 2019-01-19 LAB — COMPREHENSIVE METABOLIC PANEL
ALT: 21 U/L (ref 0–35)
AST: 24 U/L (ref 0–37)
Albumin: 4.4 g/dL (ref 3.5–5.2)
Alkaline Phosphatase: 60 U/L (ref 39–117)
BUN: 11 mg/dL (ref 6–23)
CO2: 33 mEq/L — ABNORMAL HIGH (ref 19–32)
Calcium: 9.7 mg/dL (ref 8.4–10.5)
Chloride: 102 mEq/L (ref 96–112)
Creatinine, Ser: 0.79 mg/dL (ref 0.40–1.20)
GFR: 90.04 mL/min (ref >60.00)
Glucose, Bld: 125 mg/dL — ABNORMAL HIGH (ref 70–99)
Potassium: 3.4 mEq/L — ABNORMAL LOW (ref 3.5–5.1)
Sodium: 143 mEq/L (ref 135–145)
Total Bilirubin: 0.8 mg/dL (ref 0.2–1.2)
Total Protein: 7.1 g/dL (ref 6.0–8.3)

## 2019-01-19 LAB — HEMOGLOBIN A1C: Hgb A1c MFr Bld: 6.7 % — ABNORMAL HIGH (ref 4.6–6.5)

## 2019-01-19 NOTE — Progress Notes (Signed)
Patient ID: Angela Garner, female    DOB: 01-Dec-1960  Age: 58 y.o. MRN: [id number]    Subjective:  Subjective  HPI Angela Garner presents for f/u dm, bp and cholesterol.  HYPERTENSION   Blood pressure range-lower at home then here   Chest pain- no      Dyspnea- no Lightheadedness- no   Edema- no  Other side effects - no   Medication compliance: good Low salt diet- yes    DIABETES    Blood Sugar ranges-good per pt   Polyuria- no New Visual problems- no  Hypoglycemic symptoms- no  Other side effects-no Medication compliance - good Last eye exam- due Foot exam-today   HYPERLIPIDEMIA  Medication compliance- good RUQ pain- no  Muscle aches- no Other side effects-no     Review of Systems  Constitutional: Negative for activity change, appetite change, chills, fatigue, fever and unexpected weight change.  HENT: Negative for congestion and hearing loss.   Eyes: Negative for discharge.  Respiratory: Negative for cough and shortness of breath.   Cardiovascular: Negative for chest pain, palpitations and leg swelling.  Gastrointestinal: Negative for abdominal pain, blood in stool, constipation, diarrhea, nausea and vomiting.  Genitourinary: Negative for dysuria, frequency, hematuria and urgency.  Musculoskeletal: Negative for back pain and myalgias.  Skin: Negative for rash.  Allergic/Immunologic: Negative for environmental allergies.  Neurological: Negative for dizziness, weakness and headaches.  Hematological: Does not bruise/bleed easily.  Psychiatric/Behavioral: Negative for behavioral problems, dysphoric mood and suicidal ideas. The patient is not nervous/anxious.     History Past Medical History:  Diagnosis Date  . Allergy   . Diabetes mellitus without complication (HCC)    no meds, only check bs  . Heart murmur   . Hyperlipidemia   . Hypertension     She has a past surgical history that includes Uterine fibroid surgery.   Her family history includes Clotting  disorder in her brother; Dementia in her mother; Diabetes in her father; Heart disease in her father; Hypertension in her father.She reports that she has never smoked. She has never used smokeless tobacco. She reports that she does not drink alcohol or use drugs.  Current Outpatient Medications on File Prior to Visit  Medication Sig Dispense Refill  . amLODipine (NORVASC) 5 MG tablet 2 po qd 180 tablet 3  . diclofenac (VOLTAREN) 75 MG EC tablet Take 1 tablet (75 mg total) by mouth 2 (two) times daily. 60 tablet 1  . hydrochlorothiazide (HYDRODIURIL) 25 MG tablet Take 25 mg by mouth daily.    Marland Kitchen loratadine (CLARITIN) 10 MG tablet Take 1 tablet (10 mg total) by mouth daily. 30 tablet 11  . multivitamin-iron-minerals-folic acid (CENTRUM) chewable tablet Chew 1 tablet by mouth daily.    . NONFORMULARY OR COMPOUNDED ITEM Compression stocking -- thigh high  20-30 mm/hg #1  Dx varicose veint 1 each 0  . olmesartan (BENICAR) 40 MG tablet Take 1 tablet (40 mg total) by mouth daily. 90 tablet 3  . Pitavastatin Calcium (LIVALO) 4 MG TABS Take 1 tablet (4 mg total) by mouth at bedtime. 90 tablet 1   No current facility-administered medications on file prior to visit.      Objective:  Objective  Physical Exam Vitals signs and nursing note reviewed.  Constitutional:      Appearance: She is well-developed.  HENT:     Head: Normocephalic and atraumatic.  Eyes:     Conjunctiva/sclera: Conjunctivae normal.  Neck:     Musculoskeletal: Normal range of  motion and neck supple.     Thyroid: No thyromegaly.     Vascular: No carotid bruit or JVD.  Cardiovascular:     Rate and Rhythm: Normal rate and regular rhythm.     Heart sounds: Normal heart sounds. No murmur.  Pulmonary:     Effort: Pulmonary effort is normal. No respiratory distress.     Breath sounds: Normal breath sounds. No wheezing or rales.  Chest:     Chest wall: No tenderness.  Neurological:     Mental Status: She is alert and oriented to  person, place, and time.    Diabetic Foot Exam - Simple   Simple Foot Form Diabetic Foot exam was performed with the following findings: Yes 01/19/2019  9:03 AM  Visual Inspection No deformities, no ulcerations, no other skin breakdown bilaterally: Yes Sensation Testing Intact to touch and monofilament testing bilaterally: Yes Pulse Check Posterior Tibialis and Dorsalis pulse intact bilaterally: Yes Comments     BP 120/80 (BP Location: Right Arm, Patient Position: Sitting, Cuff Size: Normal)   Pulse 62   Temp 97.6 F (36.4 C) (Temporal)   Resp 18   Ht 5\' 8"  (1.727 m)   Wt 180 lb (81.6 kg)   SpO2 99%   BMI 27.37 kg/m  Wt Readings from Last 3 Encounters:  01/19/19 180 lb (81.6 kg)  01/13/19 184 lb (83.5 kg)  12/22/18 184 lb (83.5 kg)     Lab Results  Component Value Date   WBC 3.8 (L) 02/21/2017   HGB 14.8 02/21/2017   HCT 44.4 02/21/2017   PLT 165.0 02/21/2017   GLUCOSE 126 (H) 10/23/2018   CHOL 148 10/23/2018   TRIG 50.0 10/23/2018   HDL 76.70 10/23/2018   LDLDIRECT 120.9 08/02/2011   LDLCALC 62 10/23/2018   ALT 19 10/23/2018   AST 20 10/23/2018   NA 140 10/23/2018   K 3.7 10/23/2018   CL 104 10/23/2018   CREATININE 0.79 10/23/2018   BUN 11 10/23/2018   CO2 31 10/23/2018   TSH 1.68 02/21/2017   HGBA1C 6.6 (H) 10/23/2018   MICROALBUR 2.4 (H) 10/23/2018    Vas [place] Lower Extremity Venous Reflux  Result Date: 01/13/2019  Lower Venous Reflux Study Indications: Pain, and varicosities.  Performing Technologist: Ralene Cork RVT  Examination Guidelines: A complete evaluation includes B-mode imaging, spectral Doppler, color Doppler, and power Doppler as needed of all accessible portions of each vessel. Bilateral testing is considered an integral part of a complete examination. Limited examinations for reoccurring indications may be performed as noted. The reflux portion of the exam is performed with the patient in reverse Trendelenburg.  Venous Reflux Times Normal  value < 0.5 sec +------------------------------+----------+---------+                               Right (ms)Left (ms) +------------------------------+----------+---------+ GSV at Saphenofemoral junction1995.00             +------------------------------+----------+---------+ GSV prox calf                           2076.00   +------------------------------+----------+---------+ proximal anterior vein                  873.00    +------------------------------+----------+---------+ +------------------------------+----------+--------------+ VEIN DIAMETERS:               Right (cm)Left (cm)      +------------------------------+----------+--------------+ GSV at [place]  0.721          +------------------------------+----------+--------------+ GSV at prox thigh             0.373     0.318          +------------------------------+----------+--------------+ GSV at mid thigh              0.228     0.213          +------------------------------+----------+--------------+ GSV at distal thigh           0.302     0.364          +------------------------------+----------+--------------+ GSV at knee                   0.298     not visualized +------------------------------+----------+--------------+ GSV prox calf                 0.268     0.281          +------------------------------+----------+--------------+ SSV origin                    0.318     0.403          +------------------------------+----------+--------------+ SSV prox                      0.143     0.171          +------------------------------+----------+--------------+ SSV mid                       0.127     0.191          +------------------------------+----------+--------------+   Summary: Right: Abnormal reflux times were noted in the great saphenous vein at the saphenofemoral junction. There is no evidence of deep vein thrombosis in the lower extremity.There is no evidence of  superficial venous thrombosis. A cystic structure is found in the popliteal fossa. Left: Abnormal reflux times were noted in the great saphenous vein at the proximal calf, and in the anterior accessory vein in the proximal thigh. There is no evidence of deep vein thrombosis in the lower extremity.Successful vein closure.  *See table(s) above for measurements and observations. Electronically signed by Monica Martinez MD on 01/13/2019 at 4:35:49 PM.    Final      Assessment & Plan:  Plan  I am having Angela Garner maintain her multivitamin-iron-minerals-folic acid, loratadine, NONFORMULARY OR COMPOUNDED ITEM, olmesartan, diclofenac, Pitavastatin Calcium, amLODipine, and hydrochlorothiazide.  No orders of the defined types were placed in this encounter.   Problem List Items Addressed This Visit      Unprioritized   DM (diabetes mellitus) type II, controlled, with peripheral vascular disorder ([place])    hgba1c to be checked , minimize simple carbs. Increase exercise as tolerated. Continue current meds       Essential hypertension    Well controlled, no changes to meds. Encouraged heart healthy diet such as the DASH diet and exercise as tolerated.       Relevant Orders   Microalbumin / creatinine urine ratio   Comprehensive metabolic panel   Hyperlipidemia    Encouraged heart healthy diet, increase exercise, avoid trans fats, consider a krill oil cap daily       Other Visit Diagnoses    Uncontrolled type 2 diabetes mellitus with hyperglycemia ([place])    -  Primary   Relevant Orders   Hemoglobin A1c   Microalbumin / creatinine urine ratio   Hyperlipidemia  associated with type 2 diabetes mellitus (HCC)       Relevant Orders   Lipid panel   Comprehensive metabolic panel   Enlarged lymph node       Relevant Orders   US SOFT TISSUE HEAD & NECK (NON-THYROID)   Need for influenza vaccination       Relevant Orders   Flu Vaccine QUAD 36+ mos IM (Completed)      Follow-up: Return in  about 3 months (around 04/20/2019).  [name], DO

## 2019-01-19 NOTE — Assessment & Plan Note (Signed)
Well controlled, no changes to meds. Encouraged heart healthy diet such as the DASH diet and exercise as tolerated.  °

## 2019-01-19 NOTE — Patient Instructions (Addendum)
Get debrox over the counter for the ear wax     Carbohydrate Counting for Diabetes Mellitus, Adult  Carbohydrate counting is a method of keeping track of how many carbohydrates you eat. Eating carbohydrates naturally increases the amount of sugar (glucose) in the blood. Counting how many carbohydrates you eat helps keep your blood glucose within normal limits, which helps you manage your diabetes (diabetes mellitus). It is important to know how many carbohydrates you can safely have in each meal. This is different for every person. A diet and nutrition specialist (registered dietitian) can help you make a meal plan and calculate how many carbohydrates you should have at each meal and snack. Carbohydrates are found in the following foods:  Grains, such as breads and cereals.  Dried beans and soy products.  Starchy vegetables, such as potatoes, peas, and corn.  Fruit and fruit juices.  Milk and yogurt.  Sweets and snack foods, such as cake, cookies, candy, chips, and soft drinks. How do I count carbohydrates? There are two ways to count carbohydrates in food. You can use either of the methods or a combination of both. Reading "Nutrition Facts" on packaged food The "Nutrition Facts" list is included on the labels of almost all packaged foods and beverages in the [place] It includes:  The serving size.  Information about nutrients in each serving, including the grams (g) of carbohydrate per serving. To use the "Nutrition Facts":  Decide how many servings you will have.  Multiply the number of servings by the number of carbohydrates per serving.  The resulting number is the total amount of carbohydrates that you will be having. Learning standard serving sizes of other foods When you eat carbohydrate foods that are not packaged or do not include "Nutrition Facts" on the label, you need to measure the servings in order to count the amount of carbohydrates:   Measure the foods that you will eat with a food scale or measuring cup, if needed.  Decide how many standard-size servings you will eat.  Multiply the number of servings by 15. Most carbohydrate-rich foods have about 15 g of carbohydrates per serving. ? For example, if you eat 8 oz (170 g) of strawberries, you will have eaten 2 servings and 30 g of carbohydrates (2 servings x 15 g = 30 g).  For foods that have more than one food mixed, such as soups and casseroles, you must count the carbohydrates in each food that is included. The following list contains standard serving sizes of common carbohydrate-rich foods. Each of these servings has about 15 g of carbohydrates:   hamburger bun or  English muffin.   oz (15 mL) syrup.   oz (14 g) jelly.  1 slice of bread.  1 six-inch tortilla.  3 oz (85 g) cooked rice or pasta.  4 oz (113 g) cooked dried beans.  4 oz (113 g) starchy vegetable, such as peas, corn, or potatoes.  4 oz (113 g) hot cereal.  4 oz (113 g) mashed potatoes or  of a large baked potato.  4 oz (113 g) canned or frozen fruit.  4 oz (120 mL) fruit juice.  4-6 crackers.  6 chicken nuggets.  6 oz (170 g) unsweetened dry cereal.  6 oz (170 g) plain fat-free yogurt or yogurt sweetened with artificial sweeteners.  8 oz (240 mL) milk.  8 oz (170 g) fresh fruit or one small piece of  fruit.  24 oz (680 g) popped popcorn. Example of carbohydrate counting Sample meal  3 oz (85 g) chicken breast.  6 oz (170 g) brown rice.  4 oz (113 g) corn.  8 oz (240 mL) milk.  8 oz (170 g) strawberries with sugar-free whipped topping. Carbohydrate calculation 1. Identify the foods that contain carbohydrates: ? Rice. ? Corn. ? Milk. ? Strawberries. 2. Calculate how many servings you have of each food: ? 2 servings rice. ? 1 serving corn. ? 1 serving milk. ? 1 serving strawberries. 3. Multiply each number of servings by 15 g: ? 2 servings rice x 15 g = 30 g. ?  1 serving corn x 15 g = 15 g. ? 1 serving milk x 15 g = 15 g. ? 1 serving strawberries x 15 g = 15 g. 4. Add together all of the amounts to find the total grams of carbohydrates eaten: ? 30 g + 15 g + 15 g + 15 g = 75 g of carbohydrates total. Summary  Carbohydrate counting is a method of keeping track of how many carbohydrates you eat.  Eating carbohydrates naturally increases the amount of sugar (glucose) in the blood.  Counting how many carbohydrates you eat helps keep your blood glucose within normal limits, which helps you manage your diabetes.  A diet and nutrition specialist (registered dietitian) can help you make a meal plan and calculate how many carbohydrates you should have at each meal and snack. This information is not intended to replace advice given to you by your health care provider. Make sure you discuss any questions you have with your health care provider. Document Released: 04/09/2005 Document Revised: 11/01/2016 Document Reviewed: 09/21/2015 Elsevier Patient Education  2020 [employer].

## 2019-01-19 NOTE — Assessment & Plan Note (Signed)
Encouraged heart healthy diet, increase exercise, avoid trans fats, consider a krill oil cap daily 

## 2019-01-19 NOTE — Assessment & Plan Note (Signed)
hgba1c to be checked, minimize simple carbs. Increase exercise as tolerated. Continue current meds  

## 2019-01-20 ENCOUNTER — Other Ambulatory Visit: Payer: Self-pay | Admitting: Family Medicine

## 2019-01-20 DIAGNOSIS — R809 Proteinuria, unspecified: Secondary | ICD-10-CM

## 2019-01-21 ENCOUNTER — Telehealth: Payer: Self-pay | Admitting: *Deleted

## 2019-01-21 NOTE — Telephone Encounter (Signed)
Spoke with pt and explained 24 hour urine collection requirements and that she could pick up kit in our office Monday thru Friday from 8am to 4:30pm. Kit is waiting for pick up in the lab.   Copied from [place] [id number]. Topic: General - Inquiry >> Jan 21, 2019 11:39 AM Richardo Priest, NT wrote: Reason for CRM: Patient called in stating she saw she is supposed to give urine sample within 24 hours and was not informed of when to give this or made aware of her results. Please advise as she saw message through mychart.

## 2019-01-23 ENCOUNTER — Ambulatory Visit: Payer: BC Managed Care – PPO | Admitting: Family Medicine

## 2019-01-26 ENCOUNTER — Other Ambulatory Visit: Payer: BC Managed Care – PPO

## 2019-01-26 ENCOUNTER — Other Ambulatory Visit: Payer: Self-pay

## 2019-01-26 DIAGNOSIS — I10 Essential (primary) hypertension: Secondary | ICD-10-CM

## 2019-01-26 DIAGNOSIS — E785 Hyperlipidemia, unspecified: Secondary | ICD-10-CM

## 2019-01-26 DIAGNOSIS — E1169 Type 2 diabetes mellitus with other specified complication: Secondary | ICD-10-CM

## 2019-01-26 DIAGNOSIS — R809 Proteinuria, unspecified: Secondary | ICD-10-CM | POA: Diagnosis not present

## 2019-01-26 DIAGNOSIS — E1165 Type 2 diabetes mellitus with hyperglycemia: Secondary | ICD-10-CM

## 2019-01-27 LAB — PROTEIN, URINE, 24 HOUR: Protein, 24H Urine: 100 mg/24 h (ref 0–149)

## 2019-01-28 ENCOUNTER — Other Ambulatory Visit: Payer: Self-pay

## 2019-01-28 DIAGNOSIS — R809 Proteinuria, unspecified: Secondary | ICD-10-CM

## 2019-01-29 ENCOUNTER — Telehealth: Payer: Self-pay

## 2019-01-29 NOTE — Telephone Encounter (Signed)
Urine was normal --- 24 hour urine She did not complaint of urinary symptoms at time of visit ---  So which symptoms are no better?

## 2019-01-29 NOTE — Telephone Encounter (Signed)
Copied from [place] [id number]. Topic: General - Other >> Jan 29, 2019 10:31 AM Rayann Heman wrote: Reason for CRM: pt called and stated that she would like a call back from the nurse for urine results. Pt states that she is not feeling better and would like to know what to do. Please advise

## 2019-01-29 NOTE — Telephone Encounter (Signed)
Patient states that she feels dehydrated.  Advised about lab results.  She would like ov to discuss next step.

## 2019-01-30 ENCOUNTER — Other Ambulatory Visit: Payer: Self-pay

## 2019-01-30 ENCOUNTER — Ambulatory Visit: Payer: BC Managed Care – PPO | Admitting: Family Medicine

## 2019-01-30 ENCOUNTER — Encounter: Payer: Self-pay | Admitting: Family Medicine

## 2019-01-30 VITALS — BP 122/90 | HR 68 | Temp 97.0°F | Resp 16 | Ht 68.0 in | Wt 184.0 lb

## 2019-01-30 DIAGNOSIS — R5383 Other fatigue: Secondary | ICD-10-CM | POA: Diagnosis not present

## 2019-01-30 DIAGNOSIS — R350 Frequency of micturition: Secondary | ICD-10-CM

## 2019-01-30 DIAGNOSIS — I10 Essential (primary) hypertension: Secondary | ICD-10-CM

## 2019-01-30 DIAGNOSIS — E1169 Type 2 diabetes mellitus with other specified complication: Secondary | ICD-10-CM

## 2019-01-30 DIAGNOSIS — E1165 Type 2 diabetes mellitus with hyperglycemia: Secondary | ICD-10-CM | POA: Diagnosis not present

## 2019-01-30 DIAGNOSIS — R739 Hyperglycemia, unspecified: Secondary | ICD-10-CM | POA: Diagnosis not present

## 2019-01-30 DIAGNOSIS — E785 Hyperlipidemia, unspecified: Secondary | ICD-10-CM

## 2019-01-30 DIAGNOSIS — D649 Anemia, unspecified: Secondary | ICD-10-CM

## 2019-01-30 HISTORY — DX: Type 2 diabetes mellitus with hyperglycemia: E11.65

## 2019-01-30 LAB — POC URINALSYSI DIPSTICK (AUTOMATED)
Bilirubin, UA: NEGATIVE
Blood, UA: NEGATIVE
Glucose, UA: NEGATIVE
Ketones, UA: NEGATIVE
Leukocytes, UA: NEGATIVE
Nitrite, UA: NEGATIVE
Protein, UA: NEGATIVE
Spec Grav, UA: 1.01 (ref 1.010–1.025)
Urobilinogen, UA: 0.2 E.U./dL
pH, UA: 7 (ref 5.0–8.0)

## 2019-01-30 MED ORDER — LIVALO 4 MG PO TABS: 4.0000 mg | ORAL_TABLET | Freq: Every day | ORAL | 1 refills | Status: DC

## 2019-01-30 MED ORDER — RYBELSUS 3 MG PO TABS: 1.0000 | ORAL_TABLET | Freq: Every day | ORAL | 0 refills | Status: DC

## 2019-01-30 NOTE — Assessment & Plan Note (Addendum)
Lab Results  Component Value Date   HGBA1C 6.7 (H) 01/19/2019   llast a1c was good but sugars seem to be running higher  Metformin made her sick Try rybelsus 3 mg qd  Recheck labs 3 months

## 2019-01-30 NOTE — Progress Notes (Signed)
Patient ID: Angela Garner, female    DOB: 21-Jul-1960  Age: 58 y.o. MRN: [id number]    Subjective:  Subjective  HPI Angela Garner presents for c/o fatigue, high bs and she feels dehydrated.  thristy all the time.    She also c/o frequency of urine and thinks she might have a uti.   Lab Results  Component Value Date   HGBA1C 6.7 (H) 01/19/2019     Review of Systems  Constitutional: Negative for appetite change, diaphoresis, fatigue and unexpected weight change.  Eyes: Negative for pain, redness and visual disturbance.  Respiratory: Negative for cough, chest tightness, shortness of breath and wheezing.   Cardiovascular: Negative for chest pain, palpitations and leg swelling.  Endocrine: Positive for polydipsia. Negative for cold intolerance, heat intolerance, polyphagia and polyuria.  Genitourinary: Negative for difficulty urinating, dysuria and frequency.  Neurological: Negative for dizziness, light-headedness, numbness and headaches.    History Past Medical History:  Diagnosis Date  . Allergy   . Diabetes mellitus without complication (HCC)    no meds, only check bs  . Heart murmur   . Hyperlipidemia   . Hypertension     She has a past surgical history that includes Uterine fibroid surgery.   Her family history includes Clotting disorder in her brother; Dementia in her mother; Diabetes in her father; Heart disease in her father; Hypertension in her father.She reports that she has never smoked. She has never used smokeless tobacco. She reports that she does not drink alcohol or use drugs.  Current Outpatient Medications on File Prior to Visit  Medication Sig Dispense Refill  . amLODipine (NORVASC) 5 MG tablet 2 po qd 180 tablet 3  . diclofenac (VOLTAREN) 75 MG EC tablet Take 1 tablet (75 mg total) by mouth 2 (two) times daily. 60 tablet 1  . hydrochlorothiazide (HYDRODIURIL) 25 MG tablet Take 25 mg by mouth daily.    Marland Kitchen loratadine (CLARITIN) 10 MG tablet Take 1 tablet (10 mg total)  by mouth daily. 30 tablet 11  . multivitamin-iron-minerals-folic acid (CENTRUM) chewable tablet Chew 1 tablet by mouth daily.    . NONFORMULARY OR COMPOUNDED ITEM Compression stocking -- thigh high  20-30 mm/hg #1  Dx varicose veint 1 each 0  . olmesartan (BENICAR) 40 MG tablet Take 1 tablet (40 mg total) by mouth daily. 90 tablet 3   No current facility-administered medications on file prior to visit.      Objective:  Objective  Physical Exam Vitals signs and nursing note reviewed.  Constitutional:      Appearance: She is well-developed.  HENT:     Head: Normocephalic and atraumatic.  Eyes:     Conjunctiva/sclera: Conjunctivae normal.  Neck:     Musculoskeletal: Normal range of motion and neck supple.     Thyroid: No thyromegaly.     Vascular: No carotid bruit or JVD.  Cardiovascular:     Rate and Rhythm: Normal rate and regular rhythm.     Heart sounds: Normal heart sounds. No murmur.  Pulmonary:     Effort: Pulmonary effort is normal. No respiratory distress.     Breath sounds: Normal breath sounds. No wheezing or rales.  Chest:     Chest wall: No tenderness.  Neurological:     Mental Status: She is alert and oriented to person, place, and time.    BP 122/90 (BP Location: Left Arm, Patient Position: Sitting, Cuff Size: Normal)   Pulse 68   Temp (!) 97 F (36.1 C) (  Temporal)   Resp 16   Ht 5\' 8"  (1.727 m)   Wt 184 lb (83.5 kg)   SpO2 98%   BMI 27.98 kg/m  Wt Readings from Last 3 Encounters:  01/30/19 184 lb (83.5 kg)  01/19/19 180 lb (81.6 kg)  01/13/19 184 lb (83.5 kg)     Lab Results  Component Value Date   WBC 3.8 (L) 02/21/2017   HGB 14.8 02/21/2017   HCT 44.4 02/21/2017   PLT 165.0 02/21/2017   GLUCOSE 125 (H) 01/19/2019   CHOL 143 01/19/2019   TRIG 42.0 01/19/2019   HDL 79.10 01/19/2019   LDLDIRECT 120.9 08/02/2011   LDLCALC 55 01/19/2019   ALT 21 01/19/2019   AST 24 01/19/2019   NA 143 01/19/2019   K 3.4 (L) 01/19/2019   CL 102 01/19/2019    CREATININE 0.79 01/19/2019   BUN 11 01/19/2019   CO2 33 (H) 01/19/2019   TSH 1.68 02/21/2017   HGBA1C 6.7 (H) 01/19/2019   MICROALBUR 7.5 (H) 01/19/2019    US Soft Tissue Head & Neck (non-thyroid)  Result Date: 01/20/2019 CLINICAL DATA:  58 year old female with mass just below the chin discovered 1-2 months ago. EXAM: ULTRASOUND OF HEAD/NECK SOFT TISSUES TECHNIQUE: Ultrasound examination of the head and neck soft tissues was performed in the area of clinical concern. COMPARISON:  Thyroid ultrasound 01/08/2013. FINDINGS: Grayscale and brief color Doppler imaging in the area of clinical concern demonstrates multiple level 1 lymph nodes measuring less than 10 millimeter short axis, and with normal fatty hila (image 7). The largest nodes are 9 millimeters short axis. None of the nodes appear hypervascular on color Doppler. And a fairly symmetric appearance of left versus right level 1 nodes is demonstrated. A single image of the thyroid is also included, and appears stable to that in 2014. IMPRESSION: Physiologic appearing level 1 lymph nodes identified in the area of clinical concern. Recommend follow-up by clinical exam with repeat Neck Ultrasound or alternatively Neck CT (IV contrast preferred) if the area enlarges or becomes painful. Electronically Signed   By: Genevie Ann M.D.   On: 01/20/2019 20:33     Assessment & Plan:  Plan  I am having Angela Garner start on Rybelsus. I am also having her maintain her multivitamin-iron-minerals-folic acid, loratadine, NONFORMULARY OR COMPOUNDED ITEM, olmesartan, diclofenac, amLODipine, and hydrochlorothiazide.  Meds ordered this encounter  Medications  . Semaglutide (RYBELSUS) 3 MG TABS    Sig: Take 1 tablet by mouth daily.    Dispense:  30 tablet    Refill:  0    Problem List Items Addressed This Visit      Unprioritized   HYPERTENSION, BENIGN ESSENTIAL    Well controlled, no changes to meds. Encouraged heart healthy diet such as the DASH diet and  exercise as tolerated.       Uncontrolled type 2 diabetes mellitus with hyperglycemia (HCC)    Lab Results  Component Value Date   HGBA1C 6.7 (H) 01/19/2019   llast a1c was good but sugars seem to be running higher  Metformin made her sick Try rybelsus 3 mg qd  Recheck labs 3 months      Relevant Medications   Semaglutide (RYBELSUS) 3 MG TABS   Other Relevant Orders   Comprehensive metabolic panel   Urinary frequency    Check urine today Maybe from dm        Other Visit Diagnoses    Anemia, unspecified type    -  Primary  Relevant Orders   CBC with Differential/Platelet   B12 and Folate Panel   IBC + Ferritin   Ferritin   Iron Binding Cap (TIBC)([facility])   Fatigue, unspecified type       Relevant Orders   Thyroid Panel With TSH   B12 and Folate Panel   Frequency of urination       Relevant Orders   POCT Urinalysis Dipstick (Automated) (Completed)      Follow-up: Return if symptoms worsen or fail to improve.  [name], DO

## 2019-01-30 NOTE — Patient Instructions (Signed)
Microalbumin Test Why am I having this test? Albumin is a protein in the body that helps regulate how much fluid is in the blood. Normally, when the kidneys filter waste from the bloodstream, waste leaves the body through urine and important substances like albumin remain in the blood. If the kidneys are damaged, they may no longer be able to keep albumin in the blood. This causes small amounts of albumin (microalbumin, MA) in the urine. You may have this test:  If you have signs of kidney damage, such as foamy urine or swelling in the hands, feet, abdomen, or face.  To help evaluate kidney function after other kidney test results have been normal.  To monitor treatment for diabetes (diabetes mellitus), especially if your blood sugar (glucose) has been poorly controlled or you have signs of kidney damage. MA in the urine is a common complication of diabetes.  To monitor for complications caused by high blood pressure (hypertension).  To help diagnose cardiovascular disease or a heart attack. What is being tested? This test measures the amount of MA in your urine. You may have your creatinine level tested at the same time as MA. Creatinine is a waste product that the kidneys filter out of the blood. Testing creatinine levels provides more information about kidney function. What kind of sample is taken?  A urine sample is required for this test. You may be asked to collect urine samples at home over a period of 24 hours. How do I collect samples at home? When collecting a urine sample at home, make sure you:  Use supplies and instructions that you received from the lab.  Collect urine only in the germ-free (sterile) cup that you received from the lab.  Do not let any toilet paper or stool (feces) get into the cup.  Refrigerate the sample until you can return it to the lab.  Return the sample(s) to the lab as instructed. How do I prepare for this test? Tell your health care provider  about all medicines you are taking, including vitamins, herbs, eye drops, creams, and over-the-counter medicines. Follow instructions from your health care provider about changing or stopping your regular medicines. This is especially important if you are taking diabetes medicines or blood thinners. How are the results reported? Your test results will be reported as a value that indicates how much MA is in your urine. This may be given as:  Milligrams of MA per deciliter of urine (mg/dL).  Milligrams of MA per gram of creatinine (mg/g creatinine), if creatinine was also tested. Your health care provider will compare your results to normal ranges that were established after testing a large group of people (reference ranges). Reference ranges may vary among labs and hospitals. For this test, common reference ranges are:  MA only: 0-2 mg/dL.  MA and creatinine: ? Men: 0-17 mg/g creatinine. ? Women: 0-25 mg/g creatinine. What do the results mean? A result that is within the reference range means that you have a normal amount of MA in your urine. This may mean that:  Your kidneys are functioning normally.  Your diabetes or hypertension treatment is working effectively. Results that are higher than the reference range may mean that you have:  Poorly controlled diabetes. Your treatment plan may need to be adjusted.  Narrowing of the arteries due to plaque buildup (atherosclerosis).  Kidney damage due to hypertension or diabetes (nephropathy). Talk with your health care provider about what your results mean. Questions to ask your health care  provider Ask your health care provider, or the department that is doing the test:  When will my results be ready?  How will I get my results?  What are my treatment options?  What other tests do I need?  What are my next steps? Summary  Albumin is a protein that helps regulate how much fluid is in your blood.  If your kidneys are damaged, they  may no longer be able to keep albumin in your blood. This causes small amounts of albumin (microalbumin, MA) in your urine.  High MA levels may indicate kidney damage from diabetes or hypertension (nephropathy).  Talk with your health care provider about what your results mean. This information is not intended to replace advice given to you by your health care provider. Make sure you discuss any questions you have with your health care provider. Document Released: 05/12/2004 Document Revised: 03/22/2017 Document Reviewed: 12/26/2016 Elsevier Patient Education  2020 [employer].

## 2019-01-30 NOTE — Assessment & Plan Note (Signed)
Well controlled, no changes to meds. Encouraged heart healthy diet such as the DASH diet and exercise as tolerated.  °

## 2019-01-30 NOTE — Assessment & Plan Note (Signed)
Check urine today Maybe from dm

## 2019-01-31 LAB — CBC WITH DIFFERENTIAL/PLATELET
Absolute Monocytes: 586 cells/uL (ref 200–950)
Basophils Absolute: 62 cells/uL (ref 0–200)
Basophils Relative: 1.3 %
Eosinophils Absolute: 53 cells/uL (ref 15–500)
Eosinophils Relative: 1.1 %
HCT: 41.4 % (ref 35.0–45.0)
Hemoglobin: 13.9 g/dL (ref 11.7–15.5)
Lymphs Abs: 2424 cells/uL (ref 850–3900)
MCH: 32.3 pg (ref 27.0–33.0)
MCHC: 33.6 g/dL (ref 32.0–36.0)
MCV: 96.1 fL (ref 80.0–100.0)
MPV: 11.4 fL (ref 7.5–12.5)
Monocytes Relative: 12.2 %
Neutro Abs: 1675 cells/uL (ref 1500–7800)
Neutrophils Relative %: 34.9 %
Platelets: 173 10*3/uL (ref 140–400)
RBC: 4.31 10*6/uL (ref 3.80–5.10)
RDW: 13.1 % (ref 11.0–15.0)
Total Lymphocyte: 50.5 %
WBC: 4.8 10*3/uL (ref 3.8–10.8)

## 2019-01-31 LAB — THYROID PANEL WITH TSH
Free Thyroxine Index: 2.4 (ref 1.4–3.8)
T3 Uptake: 32 % (ref 22–35)
T4, Total: 7.5 ug/dL (ref 5.1–11.9)
TSH: 1.31 mIU/L (ref 0.40–4.50)

## 2019-01-31 LAB — COMPREHENSIVE METABOLIC PANEL
AG Ratio: 1.8 (calc) (ref 1.0–2.5)
ALT: 15 U/L (ref 6–29)
AST: 20 U/L (ref 10–35)
Albumin: 4.4 g/dL (ref 3.6–5.1)
Alkaline phosphatase (APISO): 52 U/L (ref 37–153)
BUN: 15 mg/dL (ref 7–25)
CO2: 29 mmol/L (ref 20–32)
Calcium: 10 mg/dL (ref 8.6–10.4)
Chloride: 99 mmol/L (ref 98–110)
Creat: 0.97 mg/dL (ref 0.50–1.05)
Globulin: 2.5 g/dL (calc) (ref 1.9–3.7)
Glucose, Bld: 117 mg/dL — ABNORMAL HIGH (ref 65–99)
Potassium: 3.6 mmol/L (ref 3.5–5.3)
Sodium: 137 mmol/L (ref 135–146)
Total Bilirubin: 0.6 mg/dL (ref 0.2–1.2)
Total Protein: 6.9 g/dL (ref 6.1–8.1)

## 2019-01-31 LAB — IRON, TOTAL/TOTAL IRON BINDING CAP
%SAT: 30 % (calc) (ref 16–45)
Iron: 103 ug/dL (ref 45–160)
TIBC: 339 mcg/dL (calc) (ref 250–450)

## 2019-01-31 LAB — B12 AND FOLATE PANEL
Folate: >24 ng/mL
Vitamin B-12: 617 pg/mL (ref 200–1100)

## 2019-01-31 LAB — FERRITIN: Ferritin: 189 ng/mL (ref 16–232)

## 2019-03-06 DIAGNOSIS — N3281 Overactive bladder: Secondary | ICD-10-CM | POA: Diagnosis not present

## 2019-03-06 DIAGNOSIS — N952 Postmenopausal atrophic vaginitis: Secondary | ICD-10-CM | POA: Diagnosis not present

## 2019-03-06 DIAGNOSIS — R350 Frequency of micturition: Secondary | ICD-10-CM | POA: Diagnosis not present

## 2019-03-23 DIAGNOSIS — Z1231 Encounter for screening mammogram for malignant neoplasm of breast: Secondary | ICD-10-CM | POA: Diagnosis not present

## 2019-03-23 DIAGNOSIS — Z01419 Encounter for gynecological examination (general) (routine) without abnormal findings: Secondary | ICD-10-CM | POA: Diagnosis not present

## 2019-03-23 DIAGNOSIS — Z6828 Body mass index (BMI) 28.0-28.9, adult: Secondary | ICD-10-CM | POA: Diagnosis not present

## 2019-03-23 LAB — HM MAMMOGRAPHY

## 2019-04-20 ENCOUNTER — Encounter: Payer: Self-pay | Admitting: Family Medicine

## 2019-04-20 ENCOUNTER — Other Ambulatory Visit: Payer: Self-pay | Admitting: Family Medicine

## 2019-04-20 ENCOUNTER — Ambulatory Visit: Payer: BC Managed Care – PPO | Admitting: Family Medicine

## 2019-04-20 ENCOUNTER — Other Ambulatory Visit: Payer: Self-pay

## 2019-04-20 VITALS — BP 142/92 | HR 74 | Temp 96.3°F | Resp 16 | Ht 68.0 in | Wt 178.0 lb

## 2019-04-20 DIAGNOSIS — I10 Essential (primary) hypertension: Secondary | ICD-10-CM | POA: Diagnosis not present

## 2019-04-20 DIAGNOSIS — R809 Proteinuria, unspecified: Secondary | ICD-10-CM

## 2019-04-20 DIAGNOSIS — E1151 Type 2 diabetes mellitus with diabetic peripheral angiopathy without gangrene: Secondary | ICD-10-CM

## 2019-04-20 DIAGNOSIS — E785 Hyperlipidemia, unspecified: Secondary | ICD-10-CM

## 2019-04-20 DIAGNOSIS — E1165 Type 2 diabetes mellitus with hyperglycemia: Secondary | ICD-10-CM

## 2019-04-20 DIAGNOSIS — E1169 Type 2 diabetes mellitus with other specified complication: Secondary | ICD-10-CM | POA: Diagnosis not present

## 2019-04-20 DIAGNOSIS — Z23 Encounter for immunization: Secondary | ICD-10-CM | POA: Diagnosis not present

## 2019-04-20 LAB — COMPREHENSIVE METABOLIC PANEL
ALT: 18 U/L (ref 0–35)
AST: 21 U/L (ref 0–37)
Albumin: 4.6 g/dL (ref 3.5–5.2)
Alkaline Phosphatase: 60 U/L (ref 39–117)
BUN: 13 mg/dL (ref 6–23)
CO2: 29 mEq/L (ref 19–32)
Calcium: 9.8 mg/dL (ref 8.4–10.5)
Chloride: 104 mEq/L (ref 96–112)
Creatinine, Ser: 0.79 mg/dL (ref 0.40–1.20)
GFR: 89.96 mL/min (ref >60.00)
Glucose, Bld: 131 mg/dL — ABNORMAL HIGH (ref 70–99)
Potassium: 3.7 mEq/L (ref 3.5–5.1)
Sodium: 140 mEq/L (ref 135–145)
Total Bilirubin: 0.5 mg/dL (ref 0.2–1.2)
Total Protein: 7.3 g/dL (ref 6.0–8.3)

## 2019-04-20 LAB — LIPID PANEL
Cholesterol: 212 mg/dL — ABNORMAL HIGH (ref 0–200)
HDL: 80.2 mg/dL (ref >39.00)
LDL Cholesterol: 121 mg/dL — ABNORMAL HIGH (ref 0–99)
NonHDL: 131.47
Total CHOL/HDL Ratio: 3
Triglycerides: 51 mg/dL (ref 0.0–149.0)
VLDL: 10.2 mg/dL (ref 0.0–40.0)

## 2019-04-20 LAB — MICROALBUMIN / CREATININE URINE RATIO
Creatinine,U: 210.1 mg/dL
Microalb Creat Ratio: 7.7 mg/g (ref 0.0–30.0)
Microalb, Ur: 16.2 mg/dL — ABNORMAL HIGH (ref 0.0–1.9)

## 2019-04-20 LAB — CBC WITH DIFFERENTIAL/PLATELET
Basophils Absolute: 0.1 10*3/uL (ref 0.0–0.1)
Basophils Relative: 1.1 % (ref 0.0–3.0)
Eosinophils Absolute: 0.1 10*3/uL (ref 0.0–0.7)
Eosinophils Relative: 1.7 % (ref 0.0–5.0)
HCT: 44.7 % (ref 36.0–46.0)
Hemoglobin: 15.1 g/dL — ABNORMAL HIGH (ref 12.0–15.0)
Lymphocytes Relative: 47.8 % — ABNORMAL HIGH (ref 12.0–46.0)
Lymphs Abs: 2.3 10*3/uL (ref 0.7–4.0)
MCHC: 33.8 g/dL (ref 30.0–36.0)
MCV: 95.2 fl (ref 78.0–100.0)
Monocytes Absolute: 0.4 10*3/uL (ref 0.1–1.0)
Monocytes Relative: 9 % (ref 3.0–12.0)
Neutro Abs: 1.9 10*3/uL (ref 1.4–7.7)
Neutrophils Relative %: 40.4 % — ABNORMAL LOW (ref 43.0–77.0)
Platelets: 181 10*3/uL (ref 150.0–400.0)
RBC: 4.7 Mil/uL (ref 3.87–5.11)
RDW: 13.3 % (ref 11.5–15.5)
WBC: 4.8 10*3/uL (ref 4.0–10.5)

## 2019-04-20 LAB — HEMOGLOBIN A1C: Hgb A1c MFr Bld: 6.5 % (ref 4.6–6.5)

## 2019-04-20 MED ORDER — AMLODIPINE BESYLATE 5 MG PO TABS: ORAL_TABLET | ORAL | 3 refills | Status: DC

## 2019-04-20 MED ORDER — OLMESARTAN MEDOXOMIL 40 MG PO TABS: 40.0000 mg | ORAL_TABLET | Freq: Every day | ORAL | 3 refills | Status: DC

## 2019-04-20 NOTE — Assessment & Plan Note (Signed)
Well controlled, no changes to meds. Encouraged heart healthy diet such as the DASH diet and exercise as tolerated.  °

## 2019-04-20 NOTE — Patient Instructions (Signed)
Carbohydrate Counting for Diabetes Mellitus, Adult  Carbohydrate counting is a method of keeping track of how many carbohydrates you eat. Eating carbohydrates naturally increases the amount of sugar (glucose) in the blood. Counting how many carbohydrates you eat helps keep your blood glucose within normal limits, which helps you manage your diabetes (diabetes mellitus). It is important to know how many carbohydrates you can safely have in each meal. This is different for every person. A diet and nutrition specialist (registered dietitian) can help you make a meal plan and calculate how many carbohydrates you should have at each meal and snack. Carbohydrates are found in the following foods:  Grains, such as breads and cereals.  Dried beans and soy products.  Starchy vegetables, such as potatoes, peas, and corn.  Fruit and fruit juices.  Milk and yogurt.  Sweets and snack foods, such as cake, cookies, candy, chips, and soft drinks. How do I count carbohydrates? There are two ways to count carbohydrates in food. You can use either of the methods or a combination of both. Reading "Nutrition Facts" on packaged food The "Nutrition Facts" list is included on the labels of almost all packaged foods and beverages in the [place] It includes:  The serving size.  Information about nutrients in each serving, including the grams (g) of carbohydrate per serving. To use the "Nutrition Facts":  Decide how many servings you will have.  Multiply the number of servings by the number of carbohydrates per serving.  The resulting number is the total amount of carbohydrates that you will be having. Learning standard serving sizes of other foods When you eat carbohydrate foods that are not packaged or do not include "Nutrition Facts" on the label, you need to measure the servings in order to count the amount of carbohydrates:  Measure the foods that you will eat with a food scale or measuring cup, if needed.   Decide how many standard-size servings you will eat.  Multiply the number of servings by 15. Most carbohydrate-rich foods have about 15 g of carbohydrates per serving. ? For example, if you eat 8 oz (170 g) of strawberries, you will have eaten 2 servings and 30 g of carbohydrates (2 servings x 15 g = 30 g).  For foods that have more than one food mixed, such as soups and casseroles, you must count the carbohydrates in each food that is included. The following list contains standard serving sizes of common carbohydrate-rich foods. Each of these servings has about 15 g of carbohydrates:   hamburger bun or  English muffin.   oz (15 mL) syrup.   oz (14 g) jelly.  1 slice of bread.  1 six-inch tortilla.  3 oz (85 g) cooked rice or pasta.  4 oz (113 g) cooked dried beans.  4 oz (113 g) starchy vegetable, such as peas, corn, or potatoes.  4 oz (113 g) hot cereal.  4 oz (113 g) mashed potatoes or  of a large baked potato.  4 oz (113 g) canned or frozen fruit.  4 oz (120 mL) fruit juice.  4-6 crackers.  6 chicken nuggets.  6 oz (170 g) unsweetened dry cereal.  6 oz (170 g) plain fat-free yogurt or yogurt sweetened with artificial sweeteners.  8 oz (240 mL) milk.  8 oz (170 g) fresh fruit or one small piece of fruit.  24 oz (680 g) popped popcorn. Example of carbohydrate counting Sample meal  3 oz (85 g) chicken breast.  6 oz (170 g)   brown rice.  4 oz (113 g) corn.  8 oz (240 mL) milk.  8 oz (170 g) strawberries with sugar-free whipped topping. Carbohydrate calculation 1. Identify the foods that contain carbohydrates: ? Rice. ? Corn. ? Milk. ? Strawberries. 2. Calculate how many servings you have of each food: ? 2 servings rice. ? 1 serving corn. ? 1 serving milk. ? 1 serving strawberries. 3. Multiply each number of servings by 15 g: ? 2 servings rice x 15 g = 30 g. ? 1 serving corn x 15 g = 15 g. ? 1 serving milk x 15 g = 15 g. ? 1 serving  strawberries x 15 g = 15 g. 4. Add together all of the amounts to find the total grams of carbohydrates eaten: ? 30 g + 15 g + 15 g + 15 g = 75 g of carbohydrates total. Summary  Carbohydrate counting is a method of keeping track of how many carbohydrates you eat.  Eating carbohydrates naturally increases the amount of sugar (glucose) in the blood.  Counting how many carbohydrates you eat helps keep your blood glucose within normal limits, which helps you manage your diabetes.  A diet and nutrition specialist (registered dietitian) can help you make a meal plan and calculate how many carbohydrates you should have at each meal and snack. This information is not intended to replace advice given to you by your health care provider. Make sure you discuss any questions you have with your health care provider. Document Released: 04/09/2005 Document Revised: 11/01/2016 Document Reviewed: 09/21/2015 Elsevier Patient Education  2020 [employer]  

## 2019-04-20 NOTE — Progress Notes (Signed)
Patient ID: Angela Garner, female    DOB: [birth year]  Age: 58 y.o. MRN: [id number]    Subjective:  Subjective  HPI Angela Garner presents for f/u chol , dm and bp.  HYPERTENSION   Blood pressure range-good per patient   Chest pain- no      Dyspnea- no Lightheadedness- no   Edema- no  Other side effects - no   Medication compliance:  Low salt diet- yes    DIABETES    Blood Sugar ranges-103--120  Polyuria- no New Visual problems- no  Hypoglycemic symptoms- no  Other side effects-no Medication compliance - good Last eye exam- due Foot exam- today   HYPERLIPIDEMIA  Medication compliance- poor  RUQ pain- no  Muscle aches- yes Other side effects-no       Review of Systems  Constitutional: Negative for appetite change, diaphoresis, fatigue and unexpected weight change.  Eyes: Negative for pain, redness and visual disturbance.  Respiratory: Negative for cough, chest tightness, shortness of breath and wheezing.   Cardiovascular: Negative for chest pain, palpitations and leg swelling.  Endocrine: Negative for cold intolerance, heat intolerance, polydipsia, polyphagia and polyuria.  Genitourinary: Negative for difficulty urinating, dysuria and frequency.  Neurological: Negative for dizziness, light-headedness, numbness and headaches.    History Past Medical History:  Diagnosis Date  . Allergy   . Diabetes mellitus without complication (HCC)    no meds, only check bs  . Heart murmur   . Hyperlipidemia   . Hypertension     She has a past surgical history that includes Uterine fibroid surgery.   Her family history includes Clotting disorder in her brother; Dementia in her mother; Diabetes in her father; Heart disease in her father; Hypertension in her father.She reports that she has never smoked. She has never used smokeless tobacco. She reports that she does not drink alcohol or use drugs.  Current Outpatient Medications on File Prior to Visit  Medication Sig Dispense  Refill  . diclofenac (VOLTAREN) 75 MG EC tablet Take 1 tablet (75 mg total) by mouth 2 (two) times daily. 60 tablet 1  . loratadine (CLARITIN) 10 MG tablet Take 1 tablet (10 mg total) by mouth daily. 30 tablet 11  . multivitamin-iron-minerals-folic acid (CENTRUM) chewable tablet Chew 1 tablet by mouth daily.    . NONFORMULARY OR COMPOUNDED ITEM Compression stocking -- thigh high  20-30 mm/hg #1  Dx varicose veint 1 each 0  . hydrochlorothiazide (HYDRODIURIL) 25 MG tablet Take 12.5 mg by mouth daily.     . Pitavastatin Calcium (LIVALO) 4 MG TABS Take 1 tablet (4 mg total) by mouth at bedtime. (Patient not taking: Reported on 04/20/2019) 90 tablet 1   No current facility-administered medications on file prior to visit.     Objective:  Objective  Physical Exam Vitals and nursing note reviewed.  Constitutional:      Appearance: She is well-developed.  HENT:     Head: Normocephalic and atraumatic.  Eyes:     Conjunctiva/sclera: Conjunctivae normal.  Neck:     Thyroid: No thyromegaly.     Vascular: No carotid bruit or JVD.  Cardiovascular:     Rate and Rhythm: Normal rate and regular rhythm.     Heart sounds: Normal heart sounds. No murmur.  Pulmonary:     Effort: Pulmonary effort is normal. No respiratory distress.     Breath sounds: Normal breath sounds. No wheezing or rales.  Chest:     Chest wall: No tenderness.  Musculoskeletal:  Cervical back: Normal range of motion and neck supple.  Neurological:     Mental Status: She is alert and oriented to person, place, and time.    Diabetic Foot Exam - Simple   No data filed      BP (!) 142/92 (BP Location: Left Arm, Patient Position: Sitting, Cuff Size: Normal)   Pulse 74   Temp (!) 96.3 F (35.7 C) (Temporal)   Resp 16   Ht 5\' 8"  (1.727 m)   Wt 178 lb (80.7 kg)   SpO2 100%   BMI 27.06 kg/m  Wt Readings from Last 3 Encounters:  04/20/19 178 lb (80.7 kg)  01/30/19 184 lb (83.5 kg)  01/19/19 180 lb (81.6 kg)      Lab Results  Component Value Date   WBC 4.8 01/30/2019   HGB 13.9 01/30/2019   HCT 41.4 01/30/2019   PLT 173 01/30/2019   GLUCOSE 117 (H) 01/30/2019   CHOL 143 01/19/2019   TRIG 42.0 01/19/2019   HDL 79.10 01/19/2019   LDLDIRECT 120.9 08/02/2011   LDLCALC 55 01/19/2019   ALT 15 01/30/2019   AST 20 01/30/2019   NA 137 01/30/2019   K 3.6 01/30/2019   CL 99 01/30/2019   CREATININE 0.97 01/30/2019   BUN 15 01/30/2019   CO2 29 01/30/2019   TSH 1.31 01/30/2019   HGBA1C 6.7 (H) 01/19/2019   MICROALBUR 7.5 (H) 01/19/2019    US SOFT TISSUE HEAD & NECK (NON-THYROID)  Result Date: 01/20/2019 CLINICAL DATA:  58 year old female with mass just below the chin discovered 1-2 months ago. EXAM: ULTRASOUND OF HEAD/NECK SOFT TISSUES TECHNIQUE: Ultrasound examination of the head and neck soft tissues was performed in the area of clinical concern. COMPARISON:  Thyroid ultrasound 01/08/2013. FINDINGS: Grayscale and brief color Doppler imaging in the area of clinical concern demonstrates multiple level 1 lymph nodes measuring less than 10 millimeter short axis, and with normal fatty hila (image 7). The largest nodes are 9 millimeters short axis. None of the nodes appear hypervascular on color Doppler. And a fairly symmetric appearance of left versus right level 1 nodes is demonstrated. A single image of the thyroid is also included, and appears stable to that in 2014. IMPRESSION: Physiologic appearing level 1 lymph nodes identified in the area of clinical concern. Recommend follow-up by clinical exam with repeat Neck Ultrasound or alternatively Neck CT (IV contrast preferred) if the area enlarges or becomes painful. Electronically Signed   By: Genevie Ann M.D.   On: 01/20/2019 20:33     Assessment & Plan:  Plan  I have discontinued [name]'s Rybelsus. I am also having her maintain her multivitamin-iron-minerals-folic acid, loratadine, NONFORMULARY OR COMPOUNDED ITEM, diclofenac, hydrochlorothiazide,  Livalo, olmesartan, and amLODipine.  Meds ordered this encounter  Medications  . olmesartan (BENICAR) 40 MG tablet    Sig: Take 1 tablet (40 mg total) by mouth daily.    Dispense:  90 tablet    Refill:  3  . amLODipine (NORVASC) 5 MG tablet    Sig: 2 po qd    Dispense:  180 tablet    Refill:  3    Problem List Items Addressed This Visit      Unprioritized   DM (diabetes mellitus) type II, controlled, with peripheral vascular disorder ([place])    hgba1c to be checked, minimize simple carbs. Increase exercise as tolerated. Continue current meds       Relevant Medications   olmesartan (BENICAR) 40 MG tablet   amLODipine (NORVASC)  5 MG tablet   Essential hypertension    Well controlled, no changes to meds. Encouraged heart healthy diet such as the DASH diet and exercise as tolerated.       Relevant Medications   olmesartan (BENICAR) 40 MG tablet   amLODipine (NORVASC) 5 MG tablet   Other Relevant Orders   Lipid panel   Hemoglobin A1c   Comprehensive metabolic panel   CBC with Differential   Uncontrolled type 2 diabetes mellitus with hyperglycemia (HCC)   Relevant Medications   olmesartan (BENICAR) 40 MG tablet   Other Relevant Orders   Lipid panel   Hemoglobin A1c   Comprehensive metabolic panel   CBC with Differential   Microalbumin / creatinine urine ratio    Other Visit Diagnoses    Hyperlipidemia associated with type 2 diabetes mellitus ([place])    -  Primary   Relevant Medications   olmesartan (BENICAR) 40 MG tablet   Other Relevant Orders   Lipid panel   Comprehensive metabolic panel   Need for pneumococcal vaccination       Relevant Orders   Pneumococcal vaccine 23      Follow-up: Return in about 6 months (around 10/19/2019), or if symptoms worsen or fail to improve, for annual exam, fasting.  [name], DO

## 2019-04-20 NOTE — Assessment & Plan Note (Signed)
hgba1c to be checked, minimize simple carbs. Increase exercise as tolerated. Continue current meds  

## 2019-07-20 ENCOUNTER — Ambulatory Visit: Payer: BC Managed Care – PPO | Admitting: Family Medicine

## 2019-07-20 ENCOUNTER — Encounter: Payer: Self-pay | Admitting: Family Medicine

## 2019-07-20 ENCOUNTER — Other Ambulatory Visit: Payer: Self-pay

## 2019-07-20 VITALS — BP 136/98 | HR 77 | Temp 97.5°F | Resp 18 | Ht 68.0 in | Wt 185.0 lb

## 2019-07-20 DIAGNOSIS — I10 Essential (primary) hypertension: Secondary | ICD-10-CM

## 2019-07-20 DIAGNOSIS — E1169 Type 2 diabetes mellitus with other specified complication: Secondary | ICD-10-CM | POA: Diagnosis not present

## 2019-07-20 DIAGNOSIS — E785 Hyperlipidemia, unspecified: Secondary | ICD-10-CM | POA: Diagnosis not present

## 2019-07-20 DIAGNOSIS — E1151 Type 2 diabetes mellitus with diabetic peripheral angiopathy without gangrene: Secondary | ICD-10-CM

## 2019-07-20 DIAGNOSIS — E1165 Type 2 diabetes mellitus with hyperglycemia: Secondary | ICD-10-CM

## 2019-07-20 LAB — COMPREHENSIVE METABOLIC PANEL
ALT: 19 U/L (ref 0–35)
AST: 22 U/L (ref 0–37)
Albumin: 4.5 g/dL (ref 3.5–5.2)
Alkaline Phosphatase: 69 U/L (ref 39–117)
BUN: 12 mg/dL (ref 6–23)
CO2: 29 mEq/L (ref 19–32)
Calcium: 9.6 mg/dL (ref 8.4–10.5)
Chloride: 102 mEq/L (ref 96–112)
Creatinine, Ser: 0.8 mg/dL (ref 0.40–1.20)
GFR: 88.59 mL/min (ref >60.00)
Glucose, Bld: 132 mg/dL — ABNORMAL HIGH (ref 70–99)
Potassium: 3.7 mEq/L (ref 3.5–5.1)
Sodium: 140 mEq/L (ref 135–145)
Total Bilirubin: 0.9 mg/dL (ref 0.2–1.2)
Total Protein: 7.1 g/dL (ref 6.0–8.3)

## 2019-07-20 LAB — LIPID PANEL
Cholesterol: 161 mg/dL (ref 0–200)
HDL: 77.4 mg/dL (ref >39.00)
LDL Cholesterol: 75 mg/dL (ref 0–99)
NonHDL: 83.58
Total CHOL/HDL Ratio: 2
Triglycerides: 44 mg/dL (ref 0.0–149.0)
VLDL: 8.8 mg/dL (ref 0.0–40.0)

## 2019-07-20 LAB — HEMOGLOBIN A1C: Hgb A1c MFr Bld: 6.6 % — ABNORMAL HIGH (ref 4.6–6.5)

## 2019-07-20 MED ORDER — AMLODIPINE BESYLATE 10 MG PO TABS: 10.0000 mg | ORAL_TABLET | Freq: Every day | ORAL | 3 refills | Status: DC

## 2019-07-20 NOTE — Assessment & Plan Note (Signed)
hgba1c to be checked, minimize simple carbs. Increase exercise as tolerated. Continue current meds  

## 2019-07-20 NOTE — Assessment & Plan Note (Signed)
Poorly controlled will alter medications, encouraged DASH diet, minimize caffeine and obtain adequate sleep. Report concerning symptoms and follow up as directed and as needed 

## 2019-07-20 NOTE — Assessment & Plan Note (Signed)
Tolerating statin, encouraged heart healthy diet, avoid trans fats, minimize simple carbs and saturated fats. Increase exercise as tolerated 

## 2019-07-20 NOTE — Patient Instructions (Signed)
DASH Eating Plan DASH stands for "Dietary Approaches to Stop Hypertension." The DASH eating plan is a healthy eating plan that has been shown to reduce high blood pressure (hypertension). It may also reduce your risk for type 2 diabetes, heart disease, and stroke. The DASH eating plan may also help with weight loss. What are tips for following this plan?  General guidelines  Avoid eating more than 2,300 mg (milligrams) of salt (sodium) a day. If you have hypertension, you may need to reduce your sodium intake to 1,500 mg a day.  Limit alcohol intake to no more than 1 drink a day for nonpregnant women and 2 drinks a day for men. One drink equals 12 oz of beer, 5 oz of wine, or 1 oz of hard liquor.  Work with your health care provider to maintain a healthy body weight or to lose weight. Ask what an ideal weight is for you.  Get at least 30 minutes of exercise that causes your heart to beat faster (aerobic exercise) most days of the week. Activities may include walking, swimming, or biking.  Work with your health care provider or diet and nutrition specialist (dietitian) to adjust your eating plan to your individual calorie needs. Reading food labels   Check food labels for the amount of sodium per serving. Choose foods with less than 5 percent of the Daily Value of sodium. Generally, foods with less than 300 mg of sodium per serving fit into this eating plan.  To find whole grains, look for the word "whole" as the first word in the ingredient list. Shopping  Buy products labeled as "low-sodium" or "no salt added."  Buy fresh foods. Avoid canned foods and premade or frozen meals. Cooking  Avoid adding salt when cooking. Use salt-free seasonings or herbs instead of table salt or sea salt. Check with your health care provider or pharmacist before using salt substitutes.  Do not fry foods. Cook foods using healthy methods such as baking, boiling, grilling, and broiling instead.  Cook with  heart-healthy oils, such as olive, canola, soybean, or sunflower oil. Meal planning  Eat a balanced diet that includes: ? 5 or more servings of fruits and vegetables each day. At each meal, try to fill half of your plate with fruits and vegetables. ? Up to 6-8 servings of whole grains each day. ? Less than 6 oz of lean meat, poultry, or fish each day. A 3-oz serving of meat is about the same size as a deck of cards. One egg equals 1 oz. ? 2 servings of low-fat dairy each day. ? A serving of nuts, seeds, or beans 5 times each week. ? Heart-healthy fats. Healthy fats called Omega-3 fatty acids are found in foods such as flaxseeds and coldwater fish, like sardines, salmon, and mackerel.  Limit how much you eat of the following: ? Canned or prepackaged foods. ? Food that is high in trans fat, such as fried foods. ? Food that is high in saturated fat, such as fatty meat. ? Sweets, desserts, sugary drinks, and other foods with added sugar. ? Full-fat dairy products.  Do not salt foods before eating.  Try to eat at least 2 vegetarian meals each week.  Eat more home-cooked food and less restaurant, buffet, and fast food.  When eating at a restaurant, ask that your food be prepared with less salt or no salt, if possible. What foods are recommended? The items listed may not be a complete list. Talk with your dietitian about   what dietary choices are best for you. Grains Whole-grain or whole-wheat bread. Whole-grain or whole-wheat pasta. Brown rice. Oatmeal. Quinoa. Bulgur. Whole-grain and low-sodium cereals. Pita bread. Low-fat, low-sodium crackers. Whole-wheat flour tortillas. Vegetables Fresh or frozen vegetables (raw, steamed, roasted, or grilled). Low-sodium or reduced-sodium tomato and vegetable juice. Low-sodium or reduced-sodium tomato sauce and tomato paste. Low-sodium or reduced-sodium canned vegetables. Fruits All fresh, dried, or frozen fruit. Canned fruit in natural juice (without  added sugar). Meat and other protein foods Skinless chicken or turkey. Ground chicken or turkey. Pork with fat trimmed off. Fish and seafood. Egg whites. Dried beans, peas, or lentils. Unsalted nuts, nut butters, and seeds. Unsalted canned beans. Lean cuts of beef with fat trimmed off. Low-sodium, lean deli meat. Dairy Low-fat (1%) or fat-free (skim) milk. Fat-free, low-fat, or reduced-fat cheeses. Nonfat, low-sodium ricotta or cottage cheese. Low-fat or nonfat yogurt. Low-fat, low-sodium cheese. Fats and oils Soft margarine without trans fats. Vegetable oil. Low-fat, reduced-fat, or light mayonnaise and salad dressings (reduced-sodium). Canola, safflower, olive, soybean, and sunflower oils. Avocado. Seasoning and other foods Herbs. Spices. Seasoning mixes without salt. Unsalted popcorn and pretzels. Fat-free sweets. What foods are not recommended? The items listed may not be a complete list. Talk with your dietitian about what dietary choices are best for you. Grains Baked goods made with fat, such as croissants, muffins, or some breads. Dry pasta or rice meal packs. Vegetables Creamed or fried vegetables. Vegetables in a cheese sauce. Regular canned vegetables (not low-sodium or reduced-sodium). Regular canned tomato sauce and paste (not low-sodium or reduced-sodium). Regular tomato and vegetable juice (not low-sodium or reduced-sodium). Pickles. Olives. Fruits Canned fruit in a light or heavy syrup. Fried fruit. Fruit in cream or butter sauce. Meat and other protein foods Fatty cuts of meat. Ribs. Fried meat. Bacon. Sausage. Bologna and other processed lunch meats. Salami. Fatback. Hotdogs. Bratwurst. Salted nuts and seeds. Canned beans with added salt. Canned or smoked fish. Whole eggs or egg yolks. Chicken or turkey with skin. Dairy Whole or 2% milk, cream, and half-and-half. Whole or full-fat cream cheese. Whole-fat or sweetened yogurt. Full-fat cheese. Nondairy creamers. Whipped toppings.  Processed cheese and cheese spreads. Fats and oils Butter. Stick margarine. Lard. Shortening. Ghee. Bacon fat. Tropical oils, such as coconut, palm kernel, or palm oil. Seasoning and other foods Salted popcorn and pretzels. Onion salt, garlic salt, seasoned salt, table salt, and sea salt. Worcestershire sauce. Tartar sauce. Barbecue sauce. Teriyaki sauce. Soy sauce, including reduced-sodium. Steak sauce. Canned and packaged gravies. Fish sauce. Oyster sauce. Cocktail sauce. Horseradish that you find on the shelf. Ketchup. Mustard. Meat flavorings and tenderizers. Bouillon cubes. Hot sauce and Tabasco sauce. Premade or packaged marinades. Premade or packaged taco seasonings. Relishes. Regular salad dressings. Where to find more information:  National Heart, Lung, and Blood Institute: www.nhlbi.nih.gov  American Heart Association: www.heart.org Summary  The DASH eating plan is a healthy eating plan that has been shown to reduce high blood pressure (hypertension). It may also reduce your risk for type 2 diabetes, heart disease, and stroke.  With the DASH eating plan, you should limit salt (sodium) intake to 2,300 mg a day. If you have hypertension, you may need to reduce your sodium intake to 1,500 mg a day.  When on the DASH eating plan, aim to eat more fresh fruits and vegetables, whole grains, lean proteins, low-fat dairy, and heart-healthy fats.  Work with your health care provider or diet and nutrition specialist (dietitian) to adjust your eating plan to your   individual calorie needs. This information is not intended to replace advice given to you by your health care provider. Make sure you discuss any questions you have with your health care provider. Document Revised: 03/22/2017 Document Reviewed: 04/02/2016 Elsevier Patient Education  2020 Elsevier Inc.  

## 2019-07-20 NOTE — Progress Notes (Signed)
Patient ID: Angela Garner, female    DOB: 1960/08/23  Age: 59 y.o. MRN: [id number]    Subjective:  Subjective  HPI Angela Garner presents for f/u cholesterol and bp.     She stopped the hctz because she was struggling with frequent urination   HYPERTENSION   Blood pressure range-running high  Chest pain- no      Dyspnea- no Lightheadedness- no   Edema- no  Other side effects - no   Medication compliance: good Low salt diet- yes    DIABETES    Blood Sugar ranges-running high  Polyuria- no New Visual problems- no  Hypoglycemic symptoms- no  Other side effects-no Medication compliance - good Last eye exam- last month Foot exam- 12/20   HYPERLIPIDEMIA  Medication compliance- good RUQ pain- no  Muscle aches- no Other side effects-no     Review of Systems  Constitutional: Negative for appetite change, diaphoresis, fatigue and unexpected weight change.  Eyes: Negative for pain, redness and visual disturbance.  Respiratory: Negative for cough, chest tightness, shortness of breath and wheezing.   Cardiovascular: Negative for chest pain, palpitations and leg swelling.  Endocrine: Negative for cold intolerance, heat intolerance, polydipsia, polyphagia and polyuria.  Genitourinary: Negative for difficulty urinating, dysuria and frequency.  Neurological: Negative for dizziness, light-headedness, numbness and headaches.    History Past Medical History:  Diagnosis Date  . Allergy   . Diabetes mellitus without complication (HCC)    no meds, only check bs  . Heart murmur   . Hyperlipidemia   . Hypertension     She has a past surgical history that includes Uterine fibroid surgery.   Her family history includes Clotting disorder in her brother; Dementia in her mother; Diabetes in her father; Heart disease in her father; Hypertension in her father.She reports that she has never smoked. She has never used smokeless tobacco. She reports that she does not drink alcohol or use  drugs.  Current Outpatient Medications on File Prior to Visit  Medication Sig Dispense Refill  . diclofenac (VOLTAREN) 75 MG EC tablet Take 1 tablet (75 mg total) by mouth 2 (two) times daily. 60 tablet 1  . loratadine (CLARITIN) 10 MG tablet Take 1 tablet (10 mg total) by mouth daily. 30 tablet 11  . mirabegron ER (MYRBETRIQ) 25 MG TB24 tablet Take 25 mg by mouth daily.    . multivitamin-iron-minerals-folic acid (CENTRUM) chewable tablet Chew 1 tablet by mouth daily.    . NONFORMULARY OR COMPOUNDED ITEM Compression stocking -- thigh high  20-30 mm/hg #1  Dx varicose veint 1 each 0  . olmesartan (BENICAR) 40 MG tablet Take 1 tablet (40 mg total) by mouth daily. 90 tablet 3  . Pitavastatin Calcium (LIVALO) 4 MG TABS Take 1 tablet (4 mg total) by mouth at bedtime. 90 tablet 1   No current facility-administered medications on file prior to visit.     Objective:  Objective  Physical Exam Vitals and nursing note reviewed.  Constitutional:      Appearance: She is well-developed.  HENT:     Head: Normocephalic and atraumatic.  Eyes:     Conjunctiva/sclera: Conjunctivae normal.  Neck:     Thyroid: No thyromegaly.     Vascular: No carotid bruit or JVD.  Cardiovascular:     Rate and Rhythm: Normal rate and regular rhythm.     Heart sounds: Normal heart sounds. No murmur.  Pulmonary:     Effort: Pulmonary effort is normal. No respiratory distress.  Breath sounds: Normal breath sounds. No wheezing or rales.  Chest:     Chest wall: No tenderness.  Musculoskeletal:     Cervical back: Normal range of motion and neck supple.  Neurological:     Mental Status: She is alert and oriented to person, place, and time.    BP (!) 136/98 (BP Location: Left Arm, Patient Position: Sitting, Cuff Size: Normal)   Pulse 77   Temp (!) 97.5 F (36.4 C) (Temporal)   Resp 18   Ht 5\' 8"  (1.727 m)   Wt 185 lb (83.9 kg)   SpO2 98%   BMI 28.13 kg/m  Wt Readings from Last 3 Encounters:  07/20/19 185  lb (83.9 kg)  04/20/19 178 lb (80.7 kg)  01/30/19 184 lb (83.5 kg)     Lab Results  Component Value Date   WBC 4.8 04/20/2019   HGB 15.1 (H) 04/20/2019   HCT 44.7 04/20/2019   PLT 181.0 04/20/2019   GLUCOSE 131 (H) 04/20/2019   CHOL 212 (H) 04/20/2019   TRIG 51.0 04/20/2019   HDL 80.20 04/20/2019   LDLDIRECT 120.9 08/02/2011   LDLCALC 121 (H) 04/20/2019   ALT 18 04/20/2019   AST 21 04/20/2019   NA 140 04/20/2019   K 3.7 04/20/2019   CL 104 04/20/2019   CREATININE 0.79 04/20/2019   BUN 13 04/20/2019   CO2 29 04/20/2019   TSH 1.31 01/30/2019   HGBA1C 6.5 04/20/2019   MICROALBUR 16.2 (H) 04/20/2019    US SOFT TISSUE HEAD & NECK (NON-THYROID)  Result Date: 01/20/2019 CLINICAL DATA:  59 year old female with mass just below the chin discovered 1-2 months ago. EXAM: ULTRASOUND OF HEAD/NECK SOFT TISSUES TECHNIQUE: Ultrasound examination of the head and neck soft tissues was performed in the area of clinical concern. COMPARISON:  Thyroid ultrasound 01/08/2013. FINDINGS: Grayscale and brief color Doppler imaging in the area of clinical concern demonstrates multiple level 1 lymph nodes measuring less than 10 millimeter short axis, and with normal fatty hila (image 7). The largest nodes are 9 millimeters short axis. None of the nodes appear hypervascular on color Doppler. And a fairly symmetric appearance of left versus right level 1 nodes is demonstrated. A single image of the thyroid is also included, and appears stable to that in 2014. IMPRESSION: Physiologic appearing level 1 lymph nodes identified in the area of clinical concern. Recommend follow-up by clinical exam with repeat Neck Ultrasound or alternatively Neck CT (IV contrast preferred) if the area enlarges or becomes painful. Electronically Signed   By: Genevie Ann M.D.   On: 01/20/2019 20:33     Assessment & Plan:  Plan  I have discontinued Gwendlyon A. Garner's hydrochlorothiazide and amLODipine. I am also having her start on amLODipine.  Additionally, I am having her maintain her multivitamin-iron-minerals-folic acid, loratadine, NONFORMULARY OR COMPOUNDED ITEM, diclofenac, Livalo, olmesartan, and mirabegron ER.  Meds ordered this encounter  Medications  . amLODipine (NORVASC) 10 MG tablet    Sig: Take 1 tablet (10 mg total) by mouth daily.    Dispense:  90 tablet    Refill:  3    Problem List Items Addressed This Visit      Unprioritized   DM (diabetes mellitus) type II, controlled, with peripheral vascular disorder ([place])    hgba1c to be checked , minimize simple carbs. Increase exercise as tolerated. Continue current meds       Relevant Medications   amLODipine (NORVASC) 10 MG tablet   Other Relevant Orders   Hemoglobin  A1c   Essential hypertension    Poorly controlled will alter medications, encouraged DASH diet, minimize caffeine and obtain adequate sleep. Report concerning symptoms and follow up as directed and as needed      Relevant Medications   amLODipine (NORVASC) 10 MG tablet   Hyperlipidemia - Primary    Tolerating statin, encouraged heart healthy diet, avoid trans fats, minimize simple carbs and saturated fats. Increase exercise as tolerated      Relevant Medications   amLODipine (NORVASC) 10 MG tablet   Other Relevant Orders   Lipid panel   Comprehensive metabolic panel   Uncontrolled type 2 diabetes mellitus with hyperglycemia ([place])    Other Visit Diagnoses    Hyperlipidemia associated with type 2 diabetes mellitus ([place])   (Chronic)        Follow-up: No follow-ups on file.  [name], DO

## 2019-08-18 DIAGNOSIS — R351 Nocturia: Secondary | ICD-10-CM | POA: Diagnosis not present

## 2019-08-18 DIAGNOSIS — R82998 Other abnormal findings in urine: Secondary | ICD-10-CM | POA: Diagnosis not present

## 2019-09-02 ENCOUNTER — Other Ambulatory Visit: Payer: Self-pay | Admitting: Family Medicine

## 2019-09-02 DIAGNOSIS — E1169 Type 2 diabetes mellitus with other specified complication: Secondary | ICD-10-CM

## 2019-09-02 DIAGNOSIS — E785 Hyperlipidemia, unspecified: Secondary | ICD-10-CM

## 2019-10-01 DIAGNOSIS — R351 Nocturia: Secondary | ICD-10-CM | POA: Diagnosis not present

## 2019-10-01 DIAGNOSIS — R3915 Urgency of urination: Secondary | ICD-10-CM | POA: Diagnosis not present

## 2019-10-01 DIAGNOSIS — R350 Frequency of micturition: Secondary | ICD-10-CM | POA: Diagnosis not present

## 2019-10-19 ENCOUNTER — Other Ambulatory Visit: Payer: Self-pay

## 2019-10-19 ENCOUNTER — Ambulatory Visit: Payer: BC Managed Care – PPO | Admitting: Family Medicine

## 2019-10-19 ENCOUNTER — Encounter: Payer: Self-pay | Admitting: Family Medicine

## 2019-10-19 VITALS — BP 118/90 | HR 68 | Temp 97.2°F | Resp 18 | Ht 68.0 in | Wt 185.8 lb

## 2019-10-19 DIAGNOSIS — E119 Type 2 diabetes mellitus without complications: Secondary | ICD-10-CM

## 2019-10-19 DIAGNOSIS — E1169 Type 2 diabetes mellitus with other specified complication: Secondary | ICD-10-CM

## 2019-10-19 DIAGNOSIS — E785 Hyperlipidemia, unspecified: Secondary | ICD-10-CM

## 2019-10-19 DIAGNOSIS — I10 Essential (primary) hypertension: Secondary | ICD-10-CM

## 2019-10-19 DIAGNOSIS — E1151 Type 2 diabetes mellitus with diabetic peripheral angiopathy without gangrene: Secondary | ICD-10-CM

## 2019-10-19 DIAGNOSIS — R600 Localized edema: Secondary | ICD-10-CM

## 2019-10-19 HISTORY — DX: Type 2 diabetes mellitus with other specified complication: E11.69

## 2019-10-19 HISTORY — DX: Localized edema: R60.0

## 2019-10-19 LAB — HEMOGLOBIN A1C: Hgb A1c MFr Bld: 6.7 % — ABNORMAL HIGH (ref 4.6–6.5)

## 2019-10-19 LAB — LIPID PANEL
Cholesterol: 145 mg/dL (ref 0–200)
HDL: 70.5 mg/dL (ref >39.00)
LDL Cholesterol: 66 mg/dL (ref 0–99)
NonHDL: 74.91
Total CHOL/HDL Ratio: 2
Triglycerides: 47 mg/dL (ref 0.0–149.0)
VLDL: 9.4 mg/dL (ref 0.0–40.0)

## 2019-10-19 LAB — COMPREHENSIVE METABOLIC PANEL
ALT: 20 U/L (ref 0–35)
AST: 22 U/L (ref 0–37)
Albumin: 4.4 g/dL (ref 3.5–5.2)
Alkaline Phosphatase: 76 U/L (ref 39–117)
BUN: 15 mg/dL (ref 6–23)
CO2: 31 mEq/L (ref 19–32)
Calcium: 9.5 mg/dL (ref 8.4–10.5)
Chloride: 101 mEq/L (ref 96–112)
Creatinine, Ser: 0.83 mg/dL (ref 0.40–1.20)
GFR: 84.83 mL/min (ref >60.00)
Glucose, Bld: 122 mg/dL — ABNORMAL HIGH (ref 70–99)
Potassium: 3.6 mEq/L (ref 3.5–5.1)
Sodium: 139 mEq/L (ref 135–145)
Total Bilirubin: 0.7 mg/dL (ref 0.2–1.2)
Total Protein: 7 g/dL (ref 6.0–8.3)

## 2019-10-19 LAB — MICROALBUMIN / CREATININE URINE RATIO
Creatinine,U: 141.2 mg/dL
Microalb Creat Ratio: 3.5 mg/g (ref 0.0–30.0)
Microalb, Ur: 5 mg/dL — ABNORMAL HIGH (ref 0.0–1.9)

## 2019-10-19 MED ORDER — HYDROCHLOROTHIAZIDE 25 MG PO TABS: ORAL_TABLET | ORAL | 3 refills | Status: DC

## 2019-10-19 NOTE — Patient Instructions (Signed)

## 2019-10-19 NOTE — Progress Notes (Signed)
Patient ID: Angela Garner, female    DOB: 03-16-1961  Age: 59 y.o. MRN: [id number]    Subjective:  Subjective  HPI Angela Garner presents for dm , htn and chol f/u   HPI HYPERTENSION   Blood pressure range-not checking   Chest pain- no      Dyspnea- no Lightheadedness- no   Edema- yes  Other side effects - no   Medication compliance: good Low salt diet- yes    DIABETES    Blood Sugar ranges-101-124  Polyuria- no New Visual problems- no  Hypoglycemic symptoms- no  Other side effects-no Medication compliance - good Last eye exam- 04/2019 Foot exam- today   HYPERLIPIDEMIA  Medication compliance- no RUQ pain- no  Muscle aches- no Other side effects-no        Review of Systems  Constitutional: Negative for activity change, appetite change, diaphoresis, fatigue and unexpected weight change.  Eyes: Negative for pain, redness and visual disturbance.  Respiratory: Negative for cough, chest tightness, shortness of breath and wheezing.   Cardiovascular: Negative for chest pain, palpitations and leg swelling.  Endocrine: Negative for cold intolerance, heat intolerance, polydipsia, polyphagia and polyuria.  Genitourinary: Negative for difficulty urinating, dysuria and frequency.  Neurological: Negative for dizziness, light-headedness, numbness and headaches.  Psychiatric/Behavioral: Negative for behavioral problems and dysphoric mood. The patient is not nervous/anxious.     History Past Medical History:  Diagnosis Date  . Allergy   . Diabetes mellitus without complication (HCC)    no meds, only check bs  . Heart murmur   . Hyperlipidemia   . Hypertension     She has a past surgical history that includes Uterine fibroid surgery.   Her family history includes Clotting disorder in her brother; Dementia in her mother; Diabetes in her father; Heart disease in her father; Hypertension in her father.She reports that she has never smoked. She has never used smokeless tobacco. She  reports that she does not drink alcohol and does not use drugs.  Current Outpatient Medications on File Prior to Visit  Medication Sig Dispense Refill  . amLODipine (NORVASC) 10 MG tablet Take 1 tablet (10 mg total) by mouth daily. 90 tablet 3  . diclofenac (VOLTAREN) 75 MG EC tablet Take 1 tablet (75 mg total) by mouth 2 (two) times daily. 60 tablet 1  . LIVALO 4 MG TABS TAKE 1 TABLET BY MOUTH AT BEDTIME 90 tablet 0  . loratadine (CLARITIN) 10 MG tablet Take 1 tablet (10 mg total) by mouth daily. 30 tablet 11  . multivitamin-iron-minerals-folic acid (CENTRUM) chewable tablet Chew 1 tablet by mouth daily.    . NONFORMULARY OR COMPOUNDED ITEM Compression stocking -- thigh high  20-30 mm/hg #1  Dx varicose veint 1 each 0  . olmesartan (BENICAR) 40 MG tablet Take 1 tablet (40 mg total) by mouth daily. 90 tablet 3  . mirabegron ER (MYRBETRIQ) 25 MG TB24 tablet Take 25 mg by mouth daily. (Patient not taking: Reported on 10/19/2019)     No current facility-administered medications on file prior to visit.     Objective:  Objective  Physical Exam Vitals and nursing note reviewed.  Constitutional:      Appearance: She is well-developed.  HENT:     Head: Normocephalic and atraumatic.  Eyes:     Conjunctiva/sclera: Conjunctivae normal.  Neck:     Thyroid: No thyromegaly.     Vascular: No carotid bruit or JVD.  Cardiovascular:     Rate and Rhythm: Normal rate and  regular rhythm.     Heart sounds: Normal heart sounds. No murmur heard.   Pulmonary:     Effort: Pulmonary effort is normal. No respiratory distress.     Breath sounds: Normal breath sounds. No wheezing or rales.  Chest:     Chest wall: No tenderness.  Musculoskeletal:     Cervical back: Normal range of motion and neck supple.  Neurological:     Mental Status: She is alert and oriented to person, place, and time.    BP 118/90 (BP Location: Right Arm, Patient Position: Sitting, Cuff Size: Normal)   Pulse 68   Temp (!) 97.2 F  (36.2 C) (Temporal)   Resp 18   Ht 5\' 8"  (1.727 m)   Wt 185 lb 12.8 oz (84.3 kg)   SpO2 99%   BMI 28.25 kg/m  Wt Readings from Last 3 Encounters:  10/19/19 185 lb 12.8 oz (84.3 kg)  07/20/19 185 lb (83.9 kg)  04/20/19 178 lb (80.7 kg)     Lab Results  Component Value Date   WBC 4.8 04/20/2019   HGB 15.1 (H) 04/20/2019   HCT 44.7 04/20/2019   PLT 181.0 04/20/2019   GLUCOSE 132 (H) 07/20/2019   CHOL 161 07/20/2019   TRIG 44.0 07/20/2019   HDL 77.40 07/20/2019   LDLDIRECT 120.9 08/02/2011   LDLCALC 75 07/20/2019   ALT 19 07/20/2019   AST 22 07/20/2019   NA 140 07/20/2019   K 3.7 07/20/2019   CL 102 07/20/2019   CREATININE 0.80 07/20/2019   BUN 12 07/20/2019   CO2 29 07/20/2019   TSH 1.31 01/30/2019   HGBA1C 6.6 (H) 07/20/2019   MICROALBUR 16.2 (H) 04/20/2019    US SOFT TISSUE HEAD & NECK (NON-THYROID)  Result Date: 01/20/2019 CLINICAL DATA:  59 year old female with mass just below the chin discovered 1-2 months ago. EXAM: ULTRASOUND OF HEAD/NECK SOFT TISSUES TECHNIQUE: Ultrasound examination of the head and neck soft tissues was performed in the area of clinical concern. COMPARISON:  Thyroid ultrasound 01/08/2013. FINDINGS: Grayscale and brief color Doppler imaging in the area of clinical concern demonstrates multiple level 1 lymph nodes measuring less than 10 millimeter short axis, and with normal fatty hila (image 7). The largest nodes are 9 millimeters short axis. None of the nodes appear hypervascular on color Doppler. And a fairly symmetric appearance of left versus right level 1 nodes is demonstrated. A single image of the thyroid is also included, and appears stable to that in 2014. IMPRESSION: Physiologic appearing level 1 lymph nodes identified in the area of clinical concern. Recommend follow-up by clinical exam with repeat Neck Ultrasound or alternatively Neck CT (IV contrast preferred) if the area enlarges or becomes painful. Electronically Signed   By: Genevie Ann M.D.    On: 01/20/2019 20:33     Assessment & Plan:  Plan  I am having Angela Garner start on hydrochlorothiazide. I am also having her maintain her multivitamin-iron-minerals-folic acid, loratadine, NONFORMULARY OR COMPOUNDED ITEM, diclofenac, olmesartan, mirabegron ER, amLODipine, and Livalo.  Meds ordered this encounter  Medications  . hydrochlorothiazide (HYDRODIURIL) 25 MG tablet    Sig: 1/2 po qd    Dispense:  45 tablet    Refill:  3    Problem List Items Addressed This Visit      Unprioritized   Bilateral lower extremity edema    Pt just started hctz con't compression socks Check labs       Relevant Medications   hydrochlorothiazide (HYDRODIURIL) 25 MG  tablet   DM (diabetes mellitus) type II, controlled, with peripheral vascular disorder ([place])    hgba1c to be checked , minimize simple carbs. Increase exercise as tolerated. Continue current meds      Relevant Medications   hydrochlorothiazide (HYDRODIURIL) 25 MG tablet   Essential hypertension    Well controlled, no changes to meds. Encouraged heart healthy diet such as the DASH diet and exercise as tolerated.       Relevant Medications   hydrochlorothiazide (HYDRODIURIL) 25 MG tablet   Other Relevant Orders   Lipid panel   Hemoglobin A1c   Comprehensive metabolic panel   Microalbumin / creatinine urine ratio   Hyperlipidemia associated with type 2 diabetes mellitus ([place])    Encouraged heart healthy diet, increase exercise, avoid trans fats, consider a krill oil cap daily      Relevant Orders   Lipid panel   Hemoglobin A1c   Comprehensive metabolic panel   Microalbumin / creatinine urine ratio    Other Visit Diagnoses    Diet-controlled diabetes mellitus ([place])    -  Primary   Relevant Orders   Lipid panel   Hemoglobin A1c   Comprehensive metabolic panel   Microalbumin / creatinine urine ratio      Follow-up: Return in about 6 months (around 04/19/2020), or if symptoms worsen or fail to improve, for annual  exam, fasting.  [name], DO

## 2019-10-19 NOTE — Assessment & Plan Note (Signed)
Well controlled, no changes to meds. Encouraged heart healthy diet such as the DASH diet and exercise as tolerated.  °

## 2019-10-19 NOTE — Assessment & Plan Note (Signed)
Encouraged heart healthy diet, increase exercise, avoid trans fats, consider a krill oil cap daily 

## 2019-10-19 NOTE — Assessment & Plan Note (Signed)
hgba1c to be checked, minimize simple carbs. Increase exercise as tolerated. Continue current meds  

## 2019-10-19 NOTE — Assessment & Plan Note (Signed)
Pt just started hctz con't compression socks Check labs

## 2019-12-01 DIAGNOSIS — R3915 Urgency of urination: Secondary | ICD-10-CM | POA: Diagnosis not present

## 2019-12-08 DIAGNOSIS — R350 Frequency of micturition: Secondary | ICD-10-CM | POA: Diagnosis not present

## 2019-12-08 DIAGNOSIS — R3915 Urgency of urination: Secondary | ICD-10-CM | POA: Diagnosis not present

## 2019-12-11 ENCOUNTER — Other Ambulatory Visit: Payer: Self-pay | Admitting: Family Medicine

## 2019-12-11 DIAGNOSIS — E1169 Type 2 diabetes mellitus with other specified complication: Secondary | ICD-10-CM

## 2019-12-11 DIAGNOSIS — E785 Hyperlipidemia, unspecified: Secondary | ICD-10-CM

## 2019-12-15 DIAGNOSIS — N3941 Urge incontinence: Secondary | ICD-10-CM | POA: Diagnosis not present

## 2019-12-22 DIAGNOSIS — R3915 Urgency of urination: Secondary | ICD-10-CM | POA: Diagnosis not present

## 2019-12-22 DIAGNOSIS — R350 Frequency of micturition: Secondary | ICD-10-CM | POA: Diagnosis not present

## 2019-12-29 DIAGNOSIS — R3915 Urgency of urination: Secondary | ICD-10-CM | POA: Diagnosis not present

## 2019-12-29 DIAGNOSIS — R350 Frequency of micturition: Secondary | ICD-10-CM | POA: Diagnosis not present

## 2020-01-04 ENCOUNTER — Other Ambulatory Visit: Payer: Self-pay

## 2020-01-04 ENCOUNTER — Ambulatory Visit: Payer: BC Managed Care – PPO | Admitting: Family Medicine

## 2020-01-04 VITALS — BP 118/78 | HR 71 | Temp 97.8°F | Resp 18 | Ht 68.0 in | Wt 191.8 lb

## 2020-01-04 DIAGNOSIS — I10 Essential (primary) hypertension: Secondary | ICD-10-CM | POA: Diagnosis not present

## 2020-01-04 DIAGNOSIS — E1165 Type 2 diabetes mellitus with hyperglycemia: Secondary | ICD-10-CM

## 2020-01-04 DIAGNOSIS — E1151 Type 2 diabetes mellitus with diabetic peripheral angiopathy without gangrene: Secondary | ICD-10-CM

## 2020-01-04 DIAGNOSIS — R350 Frequency of micturition: Secondary | ICD-10-CM

## 2020-01-04 LAB — POC URINALSYSI DIPSTICK (AUTOMATED)
Bilirubin, UA: NEGATIVE
Blood, UA: NEGATIVE
Glucose, UA: NEGATIVE
Ketones, UA: NEGATIVE
Leukocytes, UA: NEGATIVE
Nitrite, UA: NEGATIVE
Protein, UA: NEGATIVE
Spec Grav, UA: 1.015 (ref 1.010–1.025)
Urobilinogen, UA: 0.2 E.U./dL
pH, UA: 6 (ref 5.0–8.0)

## 2020-01-04 LAB — GLUCOSE, POCT (MANUAL RESULT ENTRY): POC Glucose: 136 mg/dl — AB (ref 70–99)

## 2020-01-04 MED ORDER — AMLODIPINE BESYLATE 5 MG PO TABS: 5.0000 mg | ORAL_TABLET | Freq: Every day | ORAL | 3 refills | Status: DC

## 2020-01-04 MED ORDER — RYBELSUS 3 MG PO TABS: 1.0000 | ORAL_TABLET | Freq: Every day | ORAL | 0 refills | Status: DC

## 2020-01-04 NOTE — Patient Instructions (Signed)
Carbohydrate Counting for Diabetes Mellitus, Adult  Carbohydrate counting is a method of keeping track of how many carbohydrates you eat. Eating carbohydrates naturally increases the amount of sugar (glucose) in the blood. Counting how many carbohydrates you eat helps keep your blood glucose within normal limits, which helps you manage your diabetes (diabetes mellitus). It is important to know how many carbohydrates you can safely have in each meal. This is different for every person. A diet and nutrition specialist (registered dietitian) can help you make a meal plan and calculate how many carbohydrates you should have at each meal and snack. Carbohydrates are found in the following foods:  Grains, such as breads and cereals.  Dried beans and soy products.  Starchy vegetables, such as potatoes, peas, and corn.  Fruit and fruit juices.  Milk and yogurt.  Sweets and snack foods, such as cake, cookies, candy, chips, and soft drinks. How do I count carbohydrates? There are two ways to count carbohydrates in food. You can use either of the methods or a combination of both. Reading "Nutrition Facts" on packaged food The "Nutrition Facts" list is included on the labels of almost all packaged foods and beverages in the [place] It includes:  The serving size.  Information about nutrients in each serving, including the grams (g) of carbohydrate per serving. To use the "Nutrition Facts":  Decide how many servings you will have.  Multiply the number of servings by the number of carbohydrates per serving.  The resulting number is the total amount of carbohydrates that you will be having. Learning standard serving sizes of other foods When you eat carbohydrate foods that are not packaged or do not include "Nutrition Facts" on the label, you need to measure the servings in order to count the amount of carbohydrates:  Measure the foods that you will eat with a food scale or measuring cup, if  needed.  Decide how many standard-size servings you will eat.  Multiply the number of servings by 15. Most carbohydrate-rich foods have about 15 g of carbohydrates per serving. ? For example, if you eat 8 oz (170 g) of strawberries, you will have eaten 2 servings and 30 g of carbohydrates (2 servings x 15 g = 30 g).  For foods that have more than one food mixed, such as soups and casseroles, you must count the carbohydrates in each food that is included. The following list contains standard serving sizes of common carbohydrate-rich foods. Each of these servings has about 15 g of carbohydrates:   hamburger bun or  English muffin.   oz (15 mL) syrup.   oz (14 g) jelly.  1 slice of bread.  1 six-inch tortilla.  3 oz (85 g) cooked rice or pasta.  4 oz (113 g) cooked dried beans.  4 oz (113 g) starchy vegetable, such as peas, corn, or potatoes.  4 oz (113 g) hot cereal.  4 oz (113 g) mashed potatoes or  of a large baked potato.  4 oz (113 g) canned or frozen fruit.  4 oz (120 mL) fruit juice.  4-6 crackers.  6 chicken nuggets.  6 oz (170 g) unsweetened dry cereal.  6 oz (170 g) plain fat-free yogurt or yogurt sweetened with artificial sweeteners.  8 oz (240 mL) milk.  8 oz (170 g) fresh fruit or one small piece of fruit.  24 oz (680 g) popped popcorn. Example of carbohydrate counting Sample meal  3 oz (85 g) chicken breast.  6 oz (170 g)   brown rice.  4 oz (113 g) corn.  8 oz (240 mL) milk.  8 oz (170 g) strawberries with sugar-free whipped topping. Carbohydrate calculation 1. Identify the foods that contain carbohydrates: ? Rice. ? Corn. ? Milk. ? Strawberries. 2. Calculate how many servings you have of each food: ? 2 servings rice. ? 1 serving corn. ? 1 serving milk. ? 1 serving strawberries. 3. Multiply each number of servings by 15 g: ? 2 servings rice x 15 g = 30 g. ? 1 serving corn x 15 g = 15 g. ? 1 serving milk x 15 g = 15 g. ? 1  serving strawberries x 15 g = 15 g. 4. Add together all of the amounts to find the total grams of carbohydrates eaten: ? 30 g + 15 g + 15 g + 15 g = 75 g of carbohydrates total. Summary  Carbohydrate counting is a method of keeping track of how many carbohydrates you eat.  Eating carbohydrates naturally increases the amount of sugar (glucose) in the blood.  Counting how many carbohydrates you eat helps keep your blood glucose within normal limits, which helps you manage your diabetes.  A diet and nutrition specialist (registered dietitian) can help you make a meal plan and calculate how many carbohydrates you should have at each meal and snack. This information is not intended to replace advice given to you by your health care provider. Make sure you discuss any questions you have with your health care provider. Document Revised: 11/01/2016 Document Reviewed: 09/21/2015 Elsevier Patient Education  2020 Elsevier Inc.  

## 2020-01-04 NOTE — Assessment & Plan Note (Signed)
Running low  Dec norvasc 5 mg daily

## 2020-01-04 NOTE — Progress Notes (Signed)
Patient ID: Angela Garner, female    DOB: [birth year]  Age: 59 y.o. MRN: [id number]    Subjective:  Subjective  HPI Angela Garner presents for concerns about her blood sugars ---- running 150-156 regularly and she has urinary frequency   She is working with urology   Review of Systems  Constitutional: Negative for appetite change, diaphoresis, fatigue and unexpected weight change.  Eyes: Negative for pain, redness and visual disturbance.  Respiratory: Negative for cough, chest tightness, shortness of breath and wheezing.   Cardiovascular: Negative for chest pain, palpitations and leg swelling.  Endocrine: Negative for cold intolerance, heat intolerance, polydipsia, polyphagia and polyuria.  Genitourinary: Negative for difficulty urinating, dysuria and frequency.  Neurological: Negative for dizziness, light-headedness, numbness and headaches.    History Past Medical History:  Diagnosis Date  . Allergy   . Diabetes mellitus without complication (HCC)    no meds, only check bs  . Heart murmur   . Hyperlipidemia   . Hypertension     She has a past surgical history that includes Uterine fibroid surgery.   Her family history includes Clotting disorder in her brother; Dementia in her mother; Diabetes in her father; Heart disease in her father; Hypertension in her father.She reports that she has never smoked. She has never used smokeless tobacco. She reports that she does not drink alcohol and does not use drugs.  Current Outpatient Medications on File Prior to Visit  Medication Sig Dispense Refill  . diclofenac (VOLTAREN) 75 MG EC tablet Take 1 tablet (75 mg total) by mouth 2 (two) times daily. 60 tablet 1  . hydrochlorothiazide (HYDRODIURIL) 25 MG tablet 1/2 po qd 45 tablet 3  . loratadine (CLARITIN) 10 MG tablet Take 1 tablet (10 mg total) by mouth daily. 30 tablet 11  . multivitamin-iron-minerals-folic acid (CENTRUM) chewable tablet Chew 1 tablet by mouth daily.    . NONFORMULARY OR  COMPOUNDED ITEM Compression stocking -- thigh high  20-30 mm/hg #1  Dx varicose veint 1 each 0  . olmesartan (BENICAR) 40 MG tablet Take 1 tablet (40 mg total) by mouth daily. 90 tablet 3  . Pitavastatin Calcium (LIVALO) 4 MG TABS Take 1 tablet (4 mg total) by mouth at bedtime. 90 tablet 1  . mirabegron ER (MYRBETRIQ) 25 MG TB24 tablet Take 25 mg by mouth daily. (Patient not taking: Reported on 10/19/2019)     No current facility-administered medications on file prior to visit.     Objective:  Objective  Physical Exam Vitals and nursing note reviewed.  Constitutional:      Appearance: She is well-developed.  HENT:     Head: Normocephalic and atraumatic.  Eyes:     Conjunctiva/sclera: Conjunctivae normal.  Neck:     Thyroid: No thyromegaly.     Vascular: No carotid bruit or JVD.  Cardiovascular:     Rate and Rhythm: Normal rate and regular rhythm.     Heart sounds: Normal heart sounds. No murmur heard.   Pulmonary:     Effort: Pulmonary effort is normal. No respiratory distress.     Breath sounds: Normal breath sounds. No wheezing or rales.  Chest:     Chest wall: No tenderness.  Musculoskeletal:     Cervical back: Normal range of motion and neck supple.  Neurological:     Mental Status: She is alert and oriented to person, place, and time.    BP 118/78 (BP Location: Right Arm, Patient Position: Sitting, Cuff Size: Normal)   Pulse 71  Temp 97.8 F (36.6 C) (Oral)   Resp 18   Ht 5\' 8"  (1.727 m)   Wt 191 lb 12.8 oz (87 kg)   SpO2 99%   BMI 29.16 kg/m  Wt Readings from Last 3 Encounters:  01/04/20 191 lb 12.8 oz (87 kg)  10/19/19 185 lb 12.8 oz (84.3 kg)  07/20/19 185 lb (83.9 kg)     Lab Results  Component Value Date   WBC 4.8 04/20/2019   HGB 15.1 (H) 04/20/2019   HCT 44.7 04/20/2019   PLT 181.0 04/20/2019   GLUCOSE 122 (H) 10/19/2019   CHOL 145 10/19/2019   TRIG 47.0 10/19/2019   HDL 70.50 10/19/2019   LDLDIRECT 120.9 08/02/2011   LDLCALC 66 10/19/2019     ALT 20 10/19/2019   AST 22 10/19/2019   NA 139 10/19/2019   K 3.6 10/19/2019   CL 101 10/19/2019   CREATININE 0.83 10/19/2019   BUN 15 10/19/2019   CO2 31 10/19/2019   TSH 1.31 01/30/2019   HGBA1C 6.7 (H) 10/19/2019   MICROALBUR 5.0 (H) 10/19/2019    US SOFT TISSUE HEAD & NECK (NON-THYROID)  Result Date: 01/20/2019 CLINICAL DATA:  59 year old female with mass just below the chin discovered 1-2 months ago. EXAM: ULTRASOUND OF HEAD/NECK SOFT TISSUES TECHNIQUE: Ultrasound examination of the head and neck soft tissues was performed in the area of clinical concern. COMPARISON:  Thyroid ultrasound 01/08/2013. FINDINGS: Grayscale and brief color Doppler imaging in the area of clinical concern demonstrates multiple level 1 lymph nodes measuring less than 10 millimeter short axis, and with normal fatty hila (image 7). The largest nodes are 9 millimeters short axis. None of the nodes appear hypervascular on color Doppler. And a fairly symmetric appearance of left versus right level 1 nodes is demonstrated. A single image of the thyroid is also included, and appears stable to that in 2014. IMPRESSION: Physiologic appearing level 1 lymph nodes identified in the area of clinical concern. Recommend follow-up by clinical exam with repeat Neck Ultrasound or alternatively Neck CT (IV contrast preferred) if the area enlarges or becomes painful. Electronically Signed   By: Genevie Ann M.D.   On: 01/20/2019 20:33     Assessment & Plan:  Plan  I have discontinued [name]'s amLODipine. I am also having her start on amLODipine and Rybelsus. Additionally, I am having her maintain her multivitamin-iron-minerals-folic acid, loratadine, NONFORMULARY OR COMPOUNDED ITEM, diclofenac, olmesartan, mirabegron ER, hydrochlorothiazide, and Livalo.  Meds ordered this encounter  Medications  . amLODipine (NORVASC) 5 MG tablet    Sig: Take 1 tablet (5 mg total) by mouth daily.    Dispense:  90 tablet    Refill:  3  .  Semaglutide (RYBELSUS) 3 MG TABS    Sig: Take 1 tablet by mouth daily.    Dispense:  30 tablet    Refill:  0    Problem List Items Addressed This Visit      Unprioritized   DM (diabetes mellitus) type II, controlled, with peripheral vascular disorder ([place])    ua normal  bs running 150-156 Lab Results  Component Value Date   HGBA1C 6.7 (H) 10/19/2019         Relevant Medications   amLODipine (NORVASC) 5 MG tablet   Semaglutide (RYBELSUS) 3 MG TABS   Other Relevant Orders   POCT glucose (manual entry) (Completed)   Essential hypertension    Running low  Dec norvasc 5 mg daily       Relevant  Medications   amLODipine (NORVASC) 5 MG tablet   Uncontrolled type 2 diabetes mellitus with hyperglycemia (HCC)   Relevant Medications   Semaglutide (RYBELSUS) 3 MG TABS   Other Relevant Orders   Urine Culture   Urinary frequency - Primary   Relevant Orders   POCT Urinalysis Dipstick (Automated) (Completed)   Urine Culture      Follow-up: Return in about 3 months (around 04/04/2020), or if symptoms worsen or fail to improve, for hypertension, hyperlipidemia, diabetes II.  [name], DO

## 2020-01-04 NOTE — Assessment & Plan Note (Signed)
ua normal  bs running 150-156 Lab Results  Component Value Date   HGBA1C 6.7 (H) 10/19/2019

## 2020-01-05 ENCOUNTER — Encounter: Payer: Self-pay | Admitting: Family Medicine

## 2020-01-05 DIAGNOSIS — R3915 Urgency of urination: Secondary | ICD-10-CM | POA: Diagnosis not present

## 2020-01-05 DIAGNOSIS — R350 Frequency of micturition: Secondary | ICD-10-CM | POA: Diagnosis not present

## 2020-01-05 LAB — URINE CULTURE
MICRO NUMBER:: 10942120
SPECIMEN QUALITY:: ADEQUATE

## 2020-01-12 DIAGNOSIS — R3915 Urgency of urination: Secondary | ICD-10-CM | POA: Diagnosis not present

## 2020-01-12 DIAGNOSIS — R350 Frequency of micturition: Secondary | ICD-10-CM | POA: Diagnosis not present

## 2020-01-26 DIAGNOSIS — R350 Frequency of micturition: Secondary | ICD-10-CM | POA: Diagnosis not present

## 2020-01-26 DIAGNOSIS — R3915 Urgency of urination: Secondary | ICD-10-CM | POA: Diagnosis not present

## 2020-01-29 ENCOUNTER — Telehealth: Payer: Self-pay | Admitting: Family Medicine

## 2020-01-29 NOTE — Telephone Encounter (Signed)
Caller ; Tijah Call Back # [phone]  Per patient medication (Semaglutide (RYBELSUS) 3 MG TABS [[id number]] ) is not working . Patient would like to if Dr Etter Sjogren would like her to get medication another month to work for change medication to something that will help her better.  Please Advise

## 2020-02-01 NOTE — Telephone Encounter (Signed)
Please advise 

## 2020-02-01 NOTE — Telephone Encounter (Signed)
Mg is loading dose---- increase to 7 mg for next rx  #30

## 2020-02-03 ENCOUNTER — Telehealth: Payer: Self-pay | Admitting: Family Medicine

## 2020-02-03 MED ORDER — RYBELSUS 7 MG PO TABS: 7.0000 mg | ORAL_TABLET | Freq: Every day | ORAL | 0 refills | Status: DC

## 2020-02-03 NOTE — Telephone Encounter (Signed)
Notified pt and sent 30 day supply to pharmacy.

## 2020-02-03 NOTE — Addendum Note (Signed)
Addended by: Kelle Darting A on: 02/03/2020 02:53 PM   Modules accepted: Orders

## 2020-02-03 NOTE — Telephone Encounter (Signed)
See 01/29/20 phone note.

## 2020-02-03 NOTE — Telephone Encounter (Signed)
Caller name: Kenadie Call back number: [phone]   Patient is calling to give [place] an update on Rybelsus. Patient doesn't think is really working her number are running around [phone] (fasting). Please advise

## 2020-03-01 ENCOUNTER — Telehealth: Payer: Self-pay | Admitting: Family Medicine

## 2020-03-01 MED ORDER — RYBELSUS 7 MG PO TABS: 7.0000 mg | ORAL_TABLET | Freq: Every day | ORAL | 2 refills | Status: DC

## 2020-03-01 NOTE — Telephone Encounter (Signed)
Semaglutide (RYBELSUS) 7 MG TABS  Patient would like to know if Dr Etter Sjogren will need to see her before renewing medication .   Please advise

## 2020-03-01 NOTE — Telephone Encounter (Signed)
Refill sent.

## 2020-03-22 ENCOUNTER — Encounter: Payer: Self-pay | Admitting: Family Medicine

## 2020-03-22 ENCOUNTER — Ambulatory Visit: Payer: BC Managed Care – PPO | Admitting: Family Medicine

## 2020-03-22 ENCOUNTER — Other Ambulatory Visit: Payer: Self-pay

## 2020-03-22 VITALS — BP 112/90 | HR 69 | Temp 98.9°F | Resp 18 | Ht 68.0 in | Wt 189.2 lb

## 2020-03-22 DIAGNOSIS — I10 Essential (primary) hypertension: Secondary | ICD-10-CM

## 2020-03-22 DIAGNOSIS — E559 Vitamin D deficiency, unspecified: Secondary | ICD-10-CM

## 2020-03-22 DIAGNOSIS — R200 Anesthesia of skin: Secondary | ICD-10-CM

## 2020-03-22 DIAGNOSIS — E1165 Type 2 diabetes mellitus with hyperglycemia: Secondary | ICD-10-CM | POA: Diagnosis not present

## 2020-03-22 DIAGNOSIS — Z23 Encounter for immunization: Secondary | ICD-10-CM | POA: Diagnosis not present

## 2020-03-22 DIAGNOSIS — E785 Hyperlipidemia, unspecified: Secondary | ICD-10-CM

## 2020-03-22 DIAGNOSIS — E1151 Type 2 diabetes mellitus with diabetic peripheral angiopathy without gangrene: Secondary | ICD-10-CM

## 2020-03-22 DIAGNOSIS — E1169 Type 2 diabetes mellitus with other specified complication: Secondary | ICD-10-CM | POA: Diagnosis not present

## 2020-03-22 HISTORY — DX: Anesthesia of skin: R20.0

## 2020-03-22 HISTORY — DX: Essential (primary) hypertension: I10

## 2020-03-22 HISTORY — DX: Vitamin D deficiency, unspecified: E55.9

## 2020-03-22 LAB — LIPID PANEL
Cholesterol: 138 mg/dL (ref 0–200)
HDL: 72.8 mg/dL (ref >39.00)
LDL Cholesterol: 57 mg/dL (ref 0–99)
NonHDL: 64.95
Total CHOL/HDL Ratio: 2
Triglycerides: 38 mg/dL (ref 0.0–149.0)
VLDL: 7.6 mg/dL (ref 0.0–40.0)

## 2020-03-22 LAB — COMPREHENSIVE METABOLIC PANEL
ALT: 26 U/L (ref 0–35)
AST: 24 U/L (ref 0–37)
Albumin: 4.4 g/dL (ref 3.5–5.2)
Alkaline Phosphatase: 73 U/L (ref 39–117)
BUN: 9 mg/dL (ref 6–23)
CO2: 34 mEq/L — ABNORMAL HIGH (ref 19–32)
Calcium: 9.4 mg/dL (ref 8.4–10.5)
Chloride: 104 mEq/L (ref 96–112)
Creatinine, Ser: 0.77 mg/dL (ref 0.40–1.20)
GFR: 83.81 mL/min (ref >60.00)
Glucose, Bld: 95 mg/dL (ref 70–99)
Potassium: 3.7 mEq/L (ref 3.5–5.1)
Sodium: 142 mEq/L (ref 135–145)
Total Bilirubin: 0.7 mg/dL (ref 0.2–1.2)
Total Protein: 7.1 g/dL (ref 6.0–8.3)

## 2020-03-22 LAB — MICROALBUMIN / CREATININE URINE RATIO
Creatinine,U: 151.5 mg/dL
Microalb Creat Ratio: 1.1 mg/g (ref 0.0–30.0)
Microalb, Ur: 1.7 mg/dL (ref 0.0–1.9)

## 2020-03-22 LAB — VITAMIN D 25 HYDROXY (VIT D DEFICIENCY, FRACTURES): VITD: 44.39 ng/mL (ref 30.00–100.00)

## 2020-03-22 LAB — HEMOGLOBIN A1C: Hgb A1c MFr Bld: 6.1 % (ref 4.6–6.5)

## 2020-03-22 NOTE — Assessment & Plan Note (Signed)
Check labs today.

## 2020-03-22 NOTE — Assessment & Plan Note (Signed)
Seems to have gotten better Pt will contact us if any symptoms return

## 2020-03-22 NOTE — Assessment & Plan Note (Signed)
Well controlled, no changes to meds. Encouraged heart healthy diet such as the DASH diet and exercise as tolerated.  °

## 2020-03-22 NOTE — Patient Instructions (Signed)
DASH Eating Plan DASH stands for "Dietary Approaches to Stop Hypertension." The DASH eating plan is a healthy eating plan that has been shown to reduce high blood pressure (hypertension). It may also reduce your risk for type 2 diabetes, heart disease, and stroke. The DASH eating plan may also help with weight loss. What are tips for following this plan?  General guidelines  Avoid eating more than 2,300 mg (milligrams) of salt (sodium) a day. If you have hypertension, you may need to reduce your sodium intake to 1,500 mg a day.  Limit alcohol intake to no more than 1 drink a day for nonpregnant women and 2 drinks a day for men. One drink equals 12 oz of beer, 5 oz of wine, or 1 oz of hard liquor.  Work with your health care provider to maintain a healthy body weight or to lose weight. Ask what an ideal weight is for you.  Get at least 30 minutes of exercise that causes your heart to beat faster (aerobic exercise) most days of the week. Activities may include walking, swimming, or biking.  Work with your health care provider or diet and nutrition specialist (dietitian) to adjust your eating plan to your individual calorie needs. Reading food labels   Check food labels for the amount of sodium per serving. Choose foods with less than 5 percent of the Daily Value of sodium. Generally, foods with less than 300 mg of sodium per serving fit into this eating plan.  To find whole grains, look for the word "whole" as the first word in the ingredient list. Shopping  Buy products labeled as "low-sodium" or "no salt added."  Buy fresh foods. Avoid canned foods and premade or frozen meals. Cooking  Avoid adding salt when cooking. Use salt-free seasonings or herbs instead of table salt or sea salt. Check with your health care provider or pharmacist before using salt substitutes.  Do not fry foods. Cook foods using healthy methods such as baking, boiling, grilling, and broiling instead.  Cook with  heart-healthy oils, such as olive, canola, soybean, or sunflower oil. Meal planning  Eat a balanced diet that includes: ? 5 or more servings of fruits and vegetables each day. At each meal, try to fill half of your plate with fruits and vegetables. ? Up to 6-8 servings of whole grains each day. ? Less than 6 oz of lean meat, poultry, or fish each day. A 3-oz serving of meat is about the same size as a deck of cards. One egg equals 1 oz. ? 2 servings of low-fat dairy each day. ? A serving of nuts, seeds, or beans 5 times each week. ? Heart-healthy fats. Healthy fats called Omega-3 fatty acids are found in foods such as flaxseeds and coldwater fish, like sardines, salmon, and mackerel.  Limit how much you eat of the following: ? Canned or prepackaged foods. ? Food that is high in trans fat, such as fried foods. ? Food that is high in saturated fat, such as fatty meat. ? Sweets, desserts, sugary drinks, and other foods with added sugar. ? Full-fat dairy products.  Do not salt foods before eating.  Try to eat at least 2 vegetarian meals each week.  Eat more home-cooked food and less restaurant, buffet, and fast food.  When eating at a restaurant, ask that your food be prepared with less salt or no salt, if possible. What foods are recommended? The items listed may not be a complete list. Talk with your dietitian about   what dietary choices are best for you. Grains Whole-grain or whole-wheat bread. Whole-grain or whole-wheat pasta. Brown rice. Oatmeal. Quinoa. Bulgur. Whole-grain and low-sodium cereals. Pita bread. Low-fat, low-sodium crackers. Whole-wheat flour tortillas. Vegetables Fresh or frozen vegetables (raw, steamed, roasted, or grilled). Low-sodium or reduced-sodium tomato and vegetable juice. Low-sodium or reduced-sodium tomato sauce and tomato paste. Low-sodium or reduced-sodium canned vegetables. Fruits All fresh, dried, or frozen fruit. Canned fruit in natural juice (without  added sugar). Meat and other protein foods Skinless chicken or turkey. Ground chicken or turkey. Pork with fat trimmed off. Fish and seafood. Egg whites. Dried beans, peas, or lentils. Unsalted nuts, nut butters, and seeds. Unsalted canned beans. Lean cuts of beef with fat trimmed off. Low-sodium, lean deli meat. Dairy Low-fat (1%) or fat-free (skim) milk. Fat-free, low-fat, or reduced-fat cheeses. Nonfat, low-sodium ricotta or cottage cheese. Low-fat or nonfat yogurt. Low-fat, low-sodium cheese. Fats and oils Soft margarine without trans fats. Vegetable oil. Low-fat, reduced-fat, or light mayonnaise and salad dressings (reduced-sodium). Canola, safflower, olive, soybean, and sunflower oils. Avocado. Seasoning and other foods Herbs. Spices. Seasoning mixes without salt. Unsalted popcorn and pretzels. Fat-free sweets. What foods are not recommended? The items listed may not be a complete list. Talk with your dietitian about what dietary choices are best for you. Grains Baked goods made with fat, such as croissants, muffins, or some breads. Dry pasta or rice meal packs. Vegetables Creamed or fried vegetables. Vegetables in a cheese sauce. Regular canned vegetables (not low-sodium or reduced-sodium). Regular canned tomato sauce and paste (not low-sodium or reduced-sodium). Regular tomato and vegetable juice (not low-sodium or reduced-sodium). Pickles. Olives. Fruits Canned fruit in a light or heavy syrup. Fried fruit. Fruit in cream or butter sauce. Meat and other protein foods Fatty cuts of meat. Ribs. Fried meat. Bacon. Sausage. Bologna and other processed lunch meats. Salami. Fatback. Hotdogs. Bratwurst. Salted nuts and seeds. Canned beans with added salt. Canned or smoked fish. Whole eggs or egg yolks. Chicken or turkey with skin. Dairy Whole or 2% milk, cream, and half-and-half. Whole or full-fat cream cheese. Whole-fat or sweetened yogurt. Full-fat cheese. Nondairy creamers. Whipped toppings.  Processed cheese and cheese spreads. Fats and oils Butter. Stick margarine. Lard. Shortening. Ghee. Bacon fat. Tropical oils, such as coconut, palm kernel, or palm oil. Seasoning and other foods Salted popcorn and pretzels. Onion salt, garlic salt, seasoned salt, table salt, and sea salt. Worcestershire sauce. Tartar sauce. Barbecue sauce. Teriyaki sauce. Soy sauce, including reduced-sodium. Steak sauce. Canned and packaged gravies. Fish sauce. Oyster sauce. Cocktail sauce. Horseradish that you find on the shelf. Ketchup. Mustard. Meat flavorings and tenderizers. Bouillon cubes. Hot sauce and Tabasco sauce. Premade or packaged marinades. Premade or packaged taco seasonings. Relishes. Regular salad dressings. Where to find more information:  National Heart, Lung, and Blood Institute: www.nhlbi.nih.gov  American Heart Association: www.heart.org Summary  The DASH eating plan is a healthy eating plan that has been shown to reduce high blood pressure (hypertension). It may also reduce your risk for type 2 diabetes, heart disease, and stroke.  With the DASH eating plan, you should limit salt (sodium) intake to 2,300 mg a day. If you have hypertension, you may need to reduce your sodium intake to 1,500 mg a day.  When on the DASH eating plan, aim to eat more fresh fruits and vegetables, whole grains, lean proteins, low-fat dairy, and heart-healthy fats.  Work with your health care provider or diet and nutrition specialist (dietitian) to adjust your eating plan to your   individual calorie needs. This information is not intended to replace advice given to you by your health care provider. Make sure you discuss any questions you have with your health care provider. Document Revised: 03/22/2017 Document Reviewed: 04/02/2016 Elsevier Patient Education  2020 Elsevier Inc.  

## 2020-03-22 NOTE — Progress Notes (Signed)
Patient ID: Angela Garner, female    DOB: Mar 15, 1961  Age: 59 y.o. MRN: [id number]    Subjective:  Subjective  HPI Angela Garner presents for numbness in both arms esp in am x 2 weeks but it is better today.  She thought she may have slept on them wrong.  No injury.  Pt is also f/u dm, chol and bp.   No other complaints   Review of Systems  Constitutional: Negative for appetite change, diaphoresis, fatigue and unexpected weight change.  Eyes: Negative for pain, redness and visual disturbance.  Respiratory: Negative for cough, chest tightness, shortness of breath and wheezing.   Cardiovascular: Negative for chest pain, palpitations and leg swelling.  Endocrine: Negative for cold intolerance, heat intolerance, polydipsia, polyphagia and polyuria.  Genitourinary: Negative for difficulty urinating, dysuria and frequency.  Neurological: Positive for numbness. Negative for dizziness, light-headedness and headaches.    History Past Medical History:  Diagnosis Date  . Allergy   . Diabetes mellitus without complication (HCC)    no meds, only check bs  . Heart murmur   . Hyperlipidemia   . Hypertension     She has a past surgical history that includes Uterine fibroid surgery.   Her family history includes Clotting disorder in her brother; Dementia in her mother; Diabetes in her father; Heart disease in her father; Hypertension in her father.She reports that she has never smoked. She has never used smokeless tobacco. She reports that she does not drink alcohol and does not use drugs.  Current Outpatient Medications on File Prior to Visit  Medication Sig Dispense Refill  . amLODipine (NORVASC) 5 MG tablet Take 1 tablet (5 mg total) by mouth daily. 90 tablet 3  . diclofenac (VOLTAREN) 75 MG EC tablet Take 1 tablet (75 mg total) by mouth 2 (two) times daily. 60 tablet 1  . hydrochlorothiazide (HYDRODIURIL) 25 MG tablet 1/2 po qd 45 tablet 3  . loratadine (CLARITIN) 10 MG tablet Take 1 tablet (10 mg  total) by mouth daily. 30 tablet 11  . multivitamin-iron-minerals-folic acid (CENTRUM) chewable tablet Chew 1 tablet by mouth daily.    . NONFORMULARY OR COMPOUNDED ITEM Compression stocking -- thigh high  20-30 mm/hg #1  Dx varicose veint 1 each 0  . olmesartan (BENICAR) 40 MG tablet Take 1 tablet (40 mg total) by mouth daily. 90 tablet 3  . Pitavastatin Calcium (LIVALO) 4 MG TABS Take 1 tablet (4 mg total) by mouth at bedtime. 90 tablet 1  . Semaglutide (RYBELSUS) 7 MG TABS Take 7 mg by mouth daily. 30 tablet 2  . mirabegron ER (MYRBETRIQ) 25 MG TB24 tablet Take 25 mg by mouth daily. (Patient not taking: Reported on 10/19/2019)     No current facility-administered medications on file prior to visit.     Objective:  Objective  Physical Exam Vitals and nursing note reviewed.  Constitutional:      Appearance: She is well-developed.  HENT:     Head: Normocephalic and atraumatic.  Eyes:     Conjunctiva/sclera: Conjunctivae normal.  Neck:     Thyroid: No thyromegaly.     Vascular: No carotid bruit or JVD.  Cardiovascular:     Rate and Rhythm: Normal rate and regular rhythm.     Heart sounds: Normal heart sounds. No murmur heard.   Pulmonary:     Effort: Pulmonary effort is normal. No respiratory distress.     Breath sounds: Normal breath sounds. No wheezing or rales.  Chest:  Chest wall: No tenderness.  Musculoskeletal:     Cervical back: Normal range of motion and neck supple.  Skin:    Findings: No erythema, lesion or rash.  Neurological:     General: No focal deficit present.     Mental Status: She is alert and oriented to person, place, and time.     Motor: No weakness.     Gait: Gait normal.     Deep Tendon Reflexes: Reflexes normal.  Psychiatric:        Mood and Affect: Mood normal.        Thought Content: Thought content normal.    Diabetic Foot Exam - Simple   Simple Foot Form Diabetic Foot exam was performed with the following findings: Yes 03/22/2020  9:04 AM   Visual Inspection No deformities, no ulcerations, no other skin breakdown bilaterally: Yes Sensation Testing Intact to touch and monofilament testing bilaterally: Yes Pulse Check Posterior Tibialis and Dorsalis pulse intact bilaterally: Yes Comments     BP 112/90 (BP Location: Right Arm, Patient Position: Sitting, Cuff Size: Normal)   Pulse 69   Temp 98.9 F (37.2 C) (Oral)   Resp 18   Ht 5\' 8"  (1.727 m)   Wt 189 lb 3.2 oz (85.8 kg)   SpO2 100%   BMI 28.77 kg/m  Wt Readings from Last 3 Encounters:  03/22/20 189 lb 3.2 oz (85.8 kg)  01/04/20 191 lb 12.8 oz (87 kg)  10/19/19 185 lb 12.8 oz (84.3 kg)     Lab Results  Component Value Date   WBC 4.8 04/20/2019   HGB 15.1 (H) 04/20/2019   HCT 44.7 04/20/2019   PLT 181.0 04/20/2019   GLUCOSE 122 (H) 10/19/2019   CHOL 145 10/19/2019   TRIG 47.0 10/19/2019   HDL 70.50 10/19/2019   LDLDIRECT 120.9 08/02/2011   LDLCALC 66 10/19/2019   ALT 20 10/19/2019   AST 22 10/19/2019   NA 139 10/19/2019   K 3.6 10/19/2019   CL 101 10/19/2019   CREATININE 0.83 10/19/2019   BUN 15 10/19/2019   CO2 31 10/19/2019   TSH 1.31 01/30/2019   HGBA1C 6.7 (H) 10/19/2019   MICROALBUR 5.0 (H) 10/19/2019    US SOFT TISSUE HEAD & NECK (NON-THYROID)  Result Date: 01/20/2019 CLINICAL DATA:  59 year old female with mass just below the chin discovered 1-2 months ago. EXAM: ULTRASOUND OF HEAD/NECK SOFT TISSUES TECHNIQUE: Ultrasound examination of the head and neck soft tissues was performed in the area of clinical concern. COMPARISON:  Thyroid ultrasound 01/08/2013. FINDINGS: Grayscale and brief color Doppler imaging in the area of clinical concern demonstrates multiple level 1 lymph nodes measuring less than 10 millimeter short axis, and with normal fatty hila (image 7). The largest nodes are 9 millimeters short axis. None of the nodes appear hypervascular on color Doppler. And a fairly symmetric appearance of left versus right level 1 nodes is  demonstrated. A single image of the thyroid is also included, and appears stable to that in 2014. IMPRESSION: Physiologic appearing level 1 lymph nodes identified in the area of clinical concern. Recommend follow-up by clinical exam with repeat Neck Ultrasound or alternatively Neck CT (IV contrast preferred) if the area enlarges or becomes painful. Electronically Signed   By: Genevie Ann M.D.   On: 01/20/2019 20:33     Assessment & Plan:  Plan  I am having Angela Garner maintain her multivitamin-iron-minerals-folic acid, loratadine, NONFORMULARY OR COMPOUNDED ITEM, diclofenac, olmesartan, mirabegron ER, hydrochlorothiazide, Livalo, amLODipine, and Rybelsus.  No orders of the defined types were placed in this encounter.   Problem List Items Addressed This Visit      Unprioritized   Hyperlipidemia associated with type 2 diabetes mellitus ([place])   Relevant Orders   Lipid panel   Hemoglobin A1c   Comprehensive metabolic panel   Microalbumin / creatinine urine ratio   Vitamin D (25 hydroxy)   Uncontrolled type 2 diabetes mellitus with hyperglycemia (HCC)   Relevant Orders   Lipid panel   Hemoglobin A1c   Comprehensive metabolic panel   Microalbumin / creatinine urine ratio   Vitamin D (25 hydroxy)    Other Visit Diagnoses    Arm numbness    -  Primary   Relevant Orders   DG Cervical Spine Complete   Primary hypertension       Relevant Orders   Lipid panel   Hemoglobin A1c   Comprehensive metabolic panel   Microalbumin / creatinine urine ratio   Vitamin D (25 hydroxy)   Vitamin D deficiency       Relevant Orders   Vitamin D (25 hydroxy)   Need for influenza vaccination       Relevant Orders   Flu Vaccine QUAD 36+ mos IM (Completed)      Follow-up: Return in about 6 months (around 09/19/2020), or if symptoms worsen or fail to improve, for annual exam, fasting.  [name], DO

## 2020-03-22 NOTE — Assessment & Plan Note (Signed)
Tolerating statin, encouraged heart healthy diet, avoid trans fats, minimize simple carbs and saturated fats. Increase exercise as tolerated 

## 2020-04-04 ENCOUNTER — Other Ambulatory Visit: Payer: Self-pay | Admitting: Family Medicine

## 2020-04-04 DIAGNOSIS — I10 Essential (primary) hypertension: Secondary | ICD-10-CM

## 2020-04-11 LAB — HM MAMMOGRAPHY

## 2020-04-26 ENCOUNTER — Encounter: Payer: Self-pay | Admitting: Family Medicine

## 2020-05-02 ENCOUNTER — Telehealth: Payer: Self-pay | Admitting: Family Medicine

## 2020-05-02 NOTE — Telephone Encounter (Signed)
Pitavastatin Calcium (LIVALO) 4 MG TABS [[id number]]     Patient states medication is $84 and she cannot pay that.   {Please advise

## 2020-05-02 NOTE — Telephone Encounter (Signed)
Pt has tried and not been able to tolerate both Pravastatin 20 mg and Simvastatin 20.   Please advise.

## 2020-05-02 NOTE — Telephone Encounter (Signed)
Try crestor 5 mg 1 po mwf  #12 2 refills  Recheck labs 3 months

## 2020-05-03 MED ORDER — ROSUVASTATIN CALCIUM 5 MG PO TABS: ORAL_TABLET | ORAL | 2 refills | Status: DC

## 2020-05-03 NOTE — Telephone Encounter (Signed)
Called and left a voicemail.

## 2020-05-03 NOTE — Telephone Encounter (Signed)
Patient is returning a call. °

## 2020-05-03 NOTE — Addendum Note (Signed)
Addended by: Jeronimo Greaves on: 05/03/2020 02:47 PM   Modules accepted: Orders

## 2020-05-03 NOTE — Telephone Encounter (Signed)
Left patient another vm on both the cell and home phones.

## 2020-05-03 NOTE — Telephone Encounter (Signed)
Spoke with pt notified about medication change , sent medication in as well.

## 2020-05-19 ENCOUNTER — Encounter: Payer: Self-pay | Admitting: Family Medicine

## 2020-05-19 ENCOUNTER — Other Ambulatory Visit: Payer: Self-pay

## 2020-05-19 ENCOUNTER — Ambulatory Visit (INDEPENDENT_AMBULATORY_CARE_PROVIDER_SITE_OTHER): Payer: BC Managed Care – PPO | Admitting: Family Medicine

## 2020-05-19 VITALS — BP 120/88 | HR 70 | Temp 98.9°F | Resp 18 | Ht 68.0 in | Wt 182.0 lb

## 2020-05-19 DIAGNOSIS — E1165 Type 2 diabetes mellitus with hyperglycemia: Secondary | ICD-10-CM

## 2020-05-19 DIAGNOSIS — E1169 Type 2 diabetes mellitus with other specified complication: Secondary | ICD-10-CM

## 2020-05-19 DIAGNOSIS — Z0000 Encounter for general adult medical examination without abnormal findings: Secondary | ICD-10-CM

## 2020-05-19 DIAGNOSIS — E785 Hyperlipidemia, unspecified: Secondary | ICD-10-CM

## 2020-05-19 DIAGNOSIS — I10 Essential (primary) hypertension: Secondary | ICD-10-CM | POA: Diagnosis not present

## 2020-05-19 LAB — COMPREHENSIVE METABOLIC PANEL
ALT: 22 U/L (ref 0–35)
AST: 23 U/L (ref 0–37)
Albumin: 4.5 g/dL (ref 3.5–5.2)
Alkaline Phosphatase: 62 U/L (ref 39–117)
BUN: 10 mg/dL (ref 6–23)
CO2: 33 mEq/L — ABNORMAL HIGH (ref 19–32)
Calcium: 9.7 mg/dL (ref 8.4–10.5)
Chloride: 103 mEq/L (ref 96–112)
Creatinine, Ser: 0.77 mg/dL (ref 0.40–1.20)
GFR: 83.72 mL/min (ref >60.00)
Glucose, Bld: 101 mg/dL — ABNORMAL HIGH (ref 70–99)
Potassium: 3.6 mEq/L (ref 3.5–5.1)
Sodium: 142 mEq/L (ref 135–145)
Total Bilirubin: 0.8 mg/dL (ref 0.2–1.2)
Total Protein: 7.3 g/dL (ref 6.0–8.3)

## 2020-05-19 LAB — CBC WITH DIFFERENTIAL/PLATELET
Basophils Absolute: 0 10*3/uL (ref 0.0–0.1)
Basophils Relative: 0.9 % (ref 0.0–3.0)
Eosinophils Absolute: 0.1 10*3/uL (ref 0.0–0.7)
Eosinophils Relative: 2.5 % (ref 0.0–5.0)
HCT: 42.9 % (ref 36.0–46.0)
Hemoglobin: 14.4 g/dL (ref 12.0–15.0)
Lymphocytes Relative: 53 % — ABNORMAL HIGH (ref 12.0–46.0)
Lymphs Abs: 1.9 10*3/uL (ref 0.7–4.0)
MCHC: 33.7 g/dL (ref 30.0–36.0)
MCV: 94.5 fl (ref 78.0–100.0)
Monocytes Absolute: 0.4 10*3/uL (ref 0.1–1.0)
Monocytes Relative: 11.1 % (ref 3.0–12.0)
Neutro Abs: 1.2 10*3/uL — ABNORMAL LOW (ref 1.4–7.7)
Neutrophils Relative %: 32.5 % — ABNORMAL LOW (ref 43.0–77.0)
Platelets: 181 10*3/uL (ref 150.0–400.0)
RBC: 4.54 Mil/uL (ref 3.87–5.11)
RDW: 13.3 % (ref 11.5–15.5)
WBC: 3.6 10*3/uL — ABNORMAL LOW (ref 4.0–10.5)

## 2020-05-19 LAB — LIPID PANEL
Cholesterol: 162 mg/dL (ref 0–200)
HDL: 74.4 mg/dL (ref >39.00)
LDL Cholesterol: 77 mg/dL (ref 0–99)
NonHDL: 88
Total CHOL/HDL Ratio: 2
Triglycerides: 54 mg/dL (ref 0.0–149.0)
VLDL: 10.8 mg/dL (ref 0.0–40.0)

## 2020-05-19 LAB — MICROALBUMIN / CREATININE URINE RATIO
Creatinine,U: 164.7 mg/dL
Microalb Creat Ratio: 4.4 mg/g (ref 0.0–30.0)
Microalb, Ur: 7.2 mg/dL — ABNORMAL HIGH (ref 0.0–1.9)

## 2020-05-19 LAB — TSH: TSH: 1.86 u[IU]/mL (ref 0.35–4.50)

## 2020-05-19 LAB — HEMOGLOBIN A1C: Hgb A1c MFr Bld: 6.1 % (ref 4.6–6.5)

## 2020-05-19 NOTE — Progress Notes (Signed)
Subjective:     Angela Garner is a 60 y.o. female and is here for a comprehensive physical exam. The patient reports no problems. F/u on bp, chol and dm as well  HPI HYPERTENSION   Blood pressure range-not checking   Chest pain- no      Dyspnea- no Lightheadedness- no   Edema- no  Other side effects - no   Medication compliance: good Low salt diet- yes    DIABETES    Blood Sugar ranges-not checking   Polyuria- no New Visual problems- no  Hypoglycemic symptoms- no  Other side effects-no Medication compliance - good Last eye exam- due    HYPERLIPIDEMIA  Medication compliance- good RUQ pain- no  Muscle aches- no Other side effects-no     Social History   Socioeconomic History  . Marital status: Legally Separated    Spouse name: Not on file  . Number of children: Not on file  . Years of education: Not on file  . Highest education level: Not on file  Occupational History  . Occupation: quality conrol     Comment: [employer]  Tobacco Use  . Smoking status: Never Smoker  . Smokeless tobacco: Never Used  Substance and Sexual Activity  . Alcohol use: No  . Drug use: No  . Sexual activity: Yes  Other Topics Concern  . Not on file  Social History Narrative  . Not on file   Social Determinants of Health   Financial Resource Strain: Not on file  Food Insecurity: Not on file  Transportation Needs: Not on file  Physical Activity: Not on file  Stress: Not on file  Social Connections: Not on file  Intimate Partner Violence: Not on file   Health Maintenance  Topic Date Due  . PAP SMEAR-Modifier  12/27/2018  . OPHTHALMOLOGY EXAM  04/08/2019  . COVID-19 Vaccine (3 - Booster for Pfizer series) 11/23/2019  . HEMOGLOBIN A1C  09/19/2020  . FOOT EXAM  03/22/2021  . MAMMOGRAM  04/11/2021  . COLONOSCOPY (Pts 45-27yrs Insurance coverage will need to be confirmed)  05/12/2023  . TETANUS/TDAP  12/23/2027  . INFLUENZA VACCINE  Completed  . PNEUMOCOCCAL POLYSACCHARIDE  VACCINE AGE 69-64 HIGH RISK  Completed  . Hepatitis C Screening  Completed  . HIV Screening  Completed    The following portions of the patient's history were reviewed and updated as appropriate:  She  has a past medical history of Allergy, Diabetes mellitus without complication ([place]), Heart murmur, Hyperlipidemia, and Hypertension. She does not have any pertinent problems on file. She  has a past surgical history that includes Uterine fibroid surgery. Her family history includes Clotting disorder in her brother; Dementia in her mother; Diabetes in her father and sister; Heart disease in her father; Hypertension in her father. She  reports that she has never smoked. She has never used smokeless tobacco. She reports that she does not drink alcohol and does not use drugs. She has a current medication list which includes the following prescription(s): amlodipine, diclofenac, hydrochlorothiazide, loratadine, multivitamin-iron-minerals-folic acid, NONFORMULARY OR COMPOUNDED ITEM, olmesartan, rosuvastatin, and rybelsus. Current Outpatient Medications on File Prior to Visit  Medication Sig Dispense Refill  . amLODipine (NORVASC) 5 MG tablet Take 1 tablet (5 mg total) by mouth daily. 90 tablet 3  . diclofenac (VOLTAREN) 75 MG EC tablet Take 1 tablet (75 mg total) by mouth 2 (two) times daily. 60 tablet 1  . hydrochlorothiazide (HYDRODIURIL) 25 MG tablet 1/2 po qd 45 tablet 3  .  loratadine (CLARITIN) 10 MG tablet Take 1 tablet (10 mg total) by mouth daily. 30 tablet 11  . multivitamin-iron-minerals-folic acid (CENTRUM) chewable tablet Chew 1 tablet by mouth daily.    . NONFORMULARY OR COMPOUNDED ITEM Compression stocking -- thigh high  20-30 mm/hg #1  Dx varicose veint 1 each 0  . olmesartan (BENICAR) 40 MG tablet Take 1 tablet by mouth once daily 90 tablet 0  . rosuvastatin (CRESTOR) 5 MG tablet Take 1 tablet by mouth , MWF 90 tablet 2  . Semaglutide (RYBELSUS) 7 MG TABS Take 7 mg by mouth daily. 30  tablet 2   No current facility-administered medications on file prior to visit.   She is allergic to metformin and related, pravachol [pravastatin sodium], and simvastatin..  Review of Systems Review of Systems  Constitutional: Negative for activity change, appetite change and fatigue.  HENT: Negative for hearing loss, congestion, tinnitus and ear discharge.  dentist q13m Eyes: Negative for visual disturbance (see optho q1y -- vision corrected to 20/20 with glasses).  Respiratory: Negative for cough, chest tightness and shortness of breath.   Cardiovascular: Negative for chest pain, palpitations and leg swelling.  Gastrointestinal: Negative for abdominal pain, diarrhea, constipation and abdominal distention.  Genitourinary: Negative for urgency, frequency, decreased urine volume and difficulty urinating.  Musculoskeletal: Negative for back pain, arthralgias and gait problem.  Skin: Negative for color change, pallor and rash.  Neurological: Negative for dizziness, light-headedness, numbness and headaches.  Hematological: Negative for adenopathy. Does not bruise/bleed easily.  Psychiatric/Behavioral: Negative for suicidal ideas, confusion, sleep disturbance, self-injury, dysphoric mood, decreased concentration and agitation.       Objective:    BP 120/88 (BP Location: Right Arm, Patient Position: Sitting, Cuff Size: Normal)   Pulse 70   Temp 98.9 F (37.2 C) (Oral)   Resp 18   Ht 5\' 8"  (1.727 m)   Wt 182 lb (82.6 kg)   SpO2 100%   BMI 27.67 kg/m  General appearance: alert, cooperative, appears stated age and no distress Head: Normocephalic, without obvious abnormality, atraumatic Eyes: negative findings: lids and lashes normal, conjunctivae and sclerae normal and pupils equal, round, reactive to light and accomodation Ears: normal TM's and external ear canals both ears Neck: no adenopathy, no carotid bruit, no JVD, supple, symmetrical, trachea midline and thyroid not enlarged,  symmetric, no tenderness/mass/nodules Back: symmetric, no curvature. ROM normal. No CVA tenderness. Lungs: clear to auscultation bilaterally Breasts: gyn Heart: regular rate and rhythm, S1, S2 normal, no murmur, click, rub or gallop Abdomen: soft, non-tender; bowel sounds normal; no masses,  no organomegaly Pelvic: deferred--gyn Extremities: extremities normal, atraumatic, no cyanosis or edema Pulses: 2+ and symmetric Skin: Skin color, texture, turgor normal. No rashes or lesions Lymph nodes: Cervical, supraclavicular, and axillary nodes normal. Neurologic: Alert and oriented X 3, normal strength and tone. Normal symmetric reflexes. Normal coordination and gait    Assessment:    Healthy female exam.      Plan:    ghm utd Check labs  See After Visit Summary for Counseling Recommendations    1. Uncontrolled type 2 diabetes mellitus with hyperglycemia ([place]) hgba1c to be checked, minimize simple carbs. Increase exercise as tolerated. Continue current meds  - Lipid panel - Hemoglobin A1c - Comprehensive metabolic panel - Microalbumin / creatinine urine ratio  2. Hyperlipidemia associated with type 2 diabetes mellitus ([place]) Tolerating statin, encouraged heart healthy diet, avoid trans fats, minimize simple carbs and saturated fats. Increase exercise as tolerated - Lipid panel - Comprehensive metabolic  panel - Microalbumin / creatinine urine ratio  3. Primary hypertension Well controlled, no changes to meds. Encouraged heart healthy diet such as the DASH diet and exercise as tolerated.  - Lipid panel - Hemoglobin A1c - TSH - CBC with Differential/Platelet - Comprehensive metabolic panel - Microalbumin / creatinine urine ratio  4. Preventative health care ghm utd Check labs See AVs

## 2020-05-19 NOTE — Patient Instructions (Signed)
Preventive Care 84-61 Years Old, Female Preventive care refers to lifestyle choices and visits with your health care provider that can promote health and wellness. This includes:  A yearly physical exam. This is also called an annual wellness visit.  Regular dental and eye exams.  Immunizations.  Screening for certain conditions.  Healthy lifestyle choices, such as: ? Eating a healthy diet. ? Getting regular exercise. ? Not using drugs or products that contain nicotine and tobacco. ? Limiting alcohol use. What can I expect for my preventive care visit? Physical exam Your health care provider will check your:  Height and weight. These may be used to calculate your BMI (body mass index). BMI is a measurement that tells if you are at a healthy weight.  Heart rate and blood pressure.  Body temperature.  Skin for abnormal spots. Counseling Your health care provider may ask you questions about your:  Past medical problems.  Family's medical history.  Alcohol, tobacco, and drug use.  Emotional well-being.  Home life and relationship well-being.  Sexual activity.  Diet, exercise, and sleep habits.  Work and work Statistician.  Access to firearms.  Method of birth control.  Menstrual cycle.  Pregnancy history. What immunizations do I need? Vaccines are usually given at various ages, according to a schedule. Your health care provider will recommend vaccines for you based on your age, medical history, and lifestyle or other factors, such as travel or where you work.   What tests do I need? Blood tests  Lipid and cholesterol levels. These may be checked every 5 years, or more often if you are over 3 years old.  Hepatitis C test.  Hepatitis B test. Screening  Lung cancer screening. You may have this screening every year starting at age 73 if you have a 30-pack-year history of smoking and currently smoke or have quit within the past 15 years.  Colorectal cancer  screening. ? All adults should have this screening starting at age 52 and continuing until age 17. ? Your health care provider may recommend screening at age 49 if you are at increased risk. ? You will have tests every 1-10 years, depending on your results and the type of screening test.  Diabetes screening. ? This is done by checking your blood sugar (glucose) after you have not eaten for a while (fasting). ? You may have this done every 1-3 years.  Mammogram. ? This may be done every 1-2 years. ? Talk with your health care provider about when you should start having regular mammograms. This may depend on whether you have a family history of breast cancer.  BRCA-related cancer screening. This may be done if you have a family history of breast, ovarian, tubal, or peritoneal cancers.  Pelvic exam and Pap test. ? This may be done every 3 years starting at age 10. ? Starting at age 11, this may be done every 5 years if you have a Pap test in combination with an HPV test. Other tests  STD (sexually transmitted disease) testing, if you are at risk.  Bone density scan. This is done to screen for osteoporosis. You may have this scan if you are at high risk for osteoporosis. Talk with your health care provider about your test results, treatment options, and if necessary, the need for more tests. Follow these instructions at home: Eating and drinking  Eat a diet that includes fresh fruits and vegetables, whole grains, lean protein, and low-fat dairy products.  Take vitamin and mineral supplements  as recommended by your health care provider.  Do not drink alcohol if: ? Your health care provider tells you not to drink. ? You are pregnant, may be pregnant, or are planning to become pregnant.  If you drink alcohol: ? Limit how much you have to 0-1 drink a day. ? Be aware of how much alcohol is in your drink. In the [place] one drink equals one 12 oz bottle of beer (355 mL), one 5 oz glass of  wine (148 mL), or one 1 oz glass of hard liquor (44 mL).   Lifestyle  Take daily care of your teeth and gums. Brush your teeth every morning and night with fluoride toothpaste. Floss one time each day.  Stay active. Exercise for at least 30 minutes 5 or more days each week.  Do not use any products that contain nicotine or tobacco, such as cigarettes, e-cigarettes, and chewing tobacco. If you need help quitting, ask your health care provider.  Do not use drugs.  If you are sexually active, practice safe sex. Use a condom or other form of protection to prevent STIs (sexually transmitted infections).  If you do not wish to become pregnant, use a form of birth control. If you plan to become pregnant, see your health care provider for a prepregnancy visit.  If told by your health care provider, take low-dose aspirin daily starting at age 50.  Find healthy ways to cope with stress, such as: ? Meditation, yoga, or listening to music. ? Journaling. ? Talking to a trusted person. ? Spending time with friends and family. Safety  Always wear your seat belt while driving or riding in a vehicle.  Do not drive: ? If you have been drinking alcohol. Do not ride with someone who has been drinking. ? When you are tired or distracted. ? While texting.  Wear a helmet and other protective equipment during sports activities.  If you have firearms in your house, make sure you follow all gun safety procedures. What's next?  Visit your health care provider once a year for an annual wellness visit.  Ask your health care provider how often you should have your eyes and teeth checked.  Stay up to date on all vaccines. This information is not intended to replace advice given to you by your health care provider. Make sure you discuss any questions you have with your health care provider. Document Revised: 01/12/2020 Document Reviewed: 12/19/2017 Elsevier Patient Education  2021 Elsevier Inc.  

## 2020-06-03 ENCOUNTER — Other Ambulatory Visit: Payer: Self-pay | Admitting: Family Medicine

## 2020-08-07 ENCOUNTER — Other Ambulatory Visit: Payer: Self-pay | Admitting: Family Medicine

## 2020-08-07 DIAGNOSIS — I10 Essential (primary) hypertension: Secondary | ICD-10-CM

## 2020-09-16 ENCOUNTER — Ambulatory Visit: Payer: BC Managed Care – PPO | Admitting: Family Medicine

## 2020-09-20 ENCOUNTER — Ambulatory Visit: Payer: BC Managed Care – PPO | Admitting: Family Medicine

## 2020-09-20 ENCOUNTER — Encounter: Payer: Self-pay | Admitting: Family Medicine

## 2020-09-20 ENCOUNTER — Other Ambulatory Visit: Payer: Self-pay

## 2020-09-20 VITALS — BP 106/80 | HR 67 | Temp 98.4°F | Resp 18 | Ht 68.0 in | Wt 185.0 lb

## 2020-09-20 DIAGNOSIS — I10 Essential (primary) hypertension: Secondary | ICD-10-CM

## 2020-09-20 DIAGNOSIS — R5383 Other fatigue: Secondary | ICD-10-CM

## 2020-09-20 DIAGNOSIS — E1169 Type 2 diabetes mellitus with other specified complication: Secondary | ICD-10-CM

## 2020-09-20 DIAGNOSIS — E1122 Type 2 diabetes mellitus with diabetic chronic kidney disease: Secondary | ICD-10-CM

## 2020-09-20 DIAGNOSIS — R002 Palpitations: Secondary | ICD-10-CM

## 2020-09-20 DIAGNOSIS — E1165 Type 2 diabetes mellitus with hyperglycemia: Secondary | ICD-10-CM

## 2020-09-20 DIAGNOSIS — E785 Hyperlipidemia, unspecified: Secondary | ICD-10-CM

## 2020-09-20 HISTORY — DX: Type 2 diabetes mellitus with diabetic chronic kidney disease: E11.22

## 2020-09-20 HISTORY — DX: Palpitations: R00.2

## 2020-09-20 HISTORY — DX: Other fatigue: R53.83

## 2020-09-20 LAB — COMPREHENSIVE METABOLIC PANEL
ALT: 17 U/L (ref 0–35)
AST: 17 U/L (ref 0–37)
Albumin: 4.4 g/dL (ref 3.5–5.2)
Alkaline Phosphatase: 71 U/L (ref 39–117)
BUN: 15 mg/dL (ref 6–23)
CO2: 30 mEq/L (ref 19–32)
Calcium: 9.6 mg/dL (ref 8.4–10.5)
Chloride: 104 mEq/L (ref 96–112)
Creatinine, Ser: 0.77 mg/dL (ref 0.40–1.20)
GFR: 83.52 mL/min (ref >60.00)
Glucose, Bld: 114 mg/dL — ABNORMAL HIGH (ref 70–99)
Potassium: 3.7 mEq/L (ref 3.5–5.1)
Sodium: 142 mEq/L (ref 135–145)
Total Bilirubin: 0.7 mg/dL (ref 0.2–1.2)
Total Protein: 6.9 g/dL (ref 6.0–8.3)

## 2020-09-20 LAB — CBC WITH DIFFERENTIAL/PLATELET
Basophils Absolute: 0 10*3/uL (ref 0.0–0.1)
Basophils Relative: 0.9 % (ref 0.0–3.0)
Eosinophils Absolute: 0.1 10*3/uL (ref 0.0–0.7)
Eosinophils Relative: 2 % (ref 0.0–5.0)
HCT: 43.8 % (ref 36.0–46.0)
Hemoglobin: 14.7 g/dL (ref 12.0–15.0)
Lymphocytes Relative: 43.7 % (ref 12.0–46.0)
Lymphs Abs: 2 10*3/uL (ref 0.7–4.0)
MCHC: 33.5 g/dL (ref 30.0–36.0)
MCV: 93.9 fl (ref 78.0–100.0)
Monocytes Absolute: 0.4 10*3/uL (ref 0.1–1.0)
Monocytes Relative: 9.1 % (ref 3.0–12.0)
Neutro Abs: 2 10*3/uL (ref 1.4–7.7)
Neutrophils Relative %: 44.3 % (ref 43.0–77.0)
Platelets: 181 10*3/uL (ref 150.0–400.0)
RBC: 4.67 Mil/uL (ref 3.87–5.11)
RDW: 13.9 % (ref 11.5–15.5)
WBC: 4.5 10*3/uL (ref 4.0–10.5)

## 2020-09-20 LAB — LIPID PANEL
Cholesterol: 184 mg/dL (ref 0–200)
HDL: 70.6 mg/dL (ref >39.00)
LDL Cholesterol: 101 mg/dL — ABNORMAL HIGH (ref 0–99)
NonHDL: 113.09
Total CHOL/HDL Ratio: 3
Triglycerides: 58 mg/dL (ref 0.0–149.0)
VLDL: 11.6 mg/dL (ref 0.0–40.0)

## 2020-09-20 LAB — TSH: TSH: 2.98 u[IU]/mL (ref 0.35–4.50)

## 2020-09-20 LAB — CK: Total CK: 224 U/L — ABNORMAL HIGH (ref 7–177)

## 2020-09-20 LAB — VITAMIN D 25 HYDROXY (VIT D DEFICIENCY, FRACTURES): VITD: 41.2 ng/mL (ref 30.00–100.00)

## 2020-09-20 LAB — VITAMIN B12: Vitamin B-12: 637 pg/mL (ref 211–911)

## 2020-09-20 LAB — HEMOGLOBIN A1C: Hgb A1c MFr Bld: 6.2 % (ref 4.6–6.5)

## 2020-09-20 MED ORDER — OLMESARTAN MEDOXOMIL 20 MG PO TABS: 20.0000 mg | ORAL_TABLET | Freq: Every day | ORAL | 2 refills | Status: DC

## 2020-09-20 NOTE — Progress Notes (Signed)
Subjective:   By signing my name below, I, Angela Garner, attest that this documentation has been prepared under the direction and in the presence of Dr. Roma Schanz, DO. 09/20/2020    Patient ID: Angela Garner, female    DOB: 08/17/1960, 60 y.o.   MRN: [id number]  Chief Complaint  Patient presents with  . Fatigue    HPI Patient is in today for a office visit c/o weakness and fatigue.  She is a little better because she was at the beach over the holiday weekend.   She complains of fatigue for the past week. She had low blood pressure recently. She checks her blood pressure at home but mentions that her blood pressure machine might be inaccurate. Her blood sugar measured around 119-125 at home. She went to her OBGYN earlier and was diagnosed with a yeast infection. She is taking vitamin D12 to manage her low energy but finds no relief.  She also reports that she occasionally feels palpitations. No chest pain or sob.    Past Medical History:  Diagnosis Date  . Allergy   . Diabetes mellitus without complication (HCC)    no meds, only check bs  . Heart murmur   . Hyperlipidemia   . Hypertension     Past Surgical History:  Procedure Laterality Date  . UTERINE FIBROID SURGERY      Family History  Problem Relation Age of Onset  . Hypertension Father   . Heart disease Father   . Diabetes Father   . Dementia Mother   . Diabetes Sister        she had stomach pain-- cause unknown  . Clotting disorder Brother   . Colon cancer Neg Hx   . Esophageal cancer Neg Hx   . Stomach cancer Neg Hx   . Rectal cancer Neg Hx     Social History   Socioeconomic History  . Marital status: Legally Separated    Spouse name: Not on file  . Number of children: Not on file  . Years of education: Not on file  . Highest education level: Not on file  Occupational History  . Occupation: quality conrol     Comment: [employer]  Tobacco Use  . Smoking status: Never Smoker  . Smokeless  tobacco: Never Used  Substance and Sexual Activity  . Alcohol use: No  . Drug use: No  . Sexual activity: Yes  Other Topics Concern  . Not on file  Social History Narrative  . Not on file   Social Determinants of Health   Financial Resource Strain: Not on file  Food Insecurity: Not on file  Transportation Needs: Not on file  Physical Activity: Not on file  Stress: Not on file  Social Connections: Not on file  Intimate Partner Violence: Not on file    Outpatient Medications Prior to Visit  Medication Sig Dispense Refill  . amLODipine (NORVASC) 5 MG tablet Take 1 tablet (5 mg total) by mouth daily. 90 tablet 3  . diclofenac (VOLTAREN) 75 MG EC tablet Take 1 tablet (75 mg total) by mouth 2 (two) times daily. 60 tablet 1  . hydrochlorothiazide (HYDRODIURIL) 25 MG tablet 1/2 po qd 45 tablet 3  . loratadine (CLARITIN) 10 MG tablet Take 1 tablet (10 mg total) by mouth daily. 30 tablet 11  . multivitamin-iron-minerals-folic acid (CENTRUM) chewable tablet Chew 1 tablet by mouth daily.    . NONFORMULARY OR COMPOUNDED ITEM Compression stocking -- thigh high  20-30 mm/hg #  1  Dx varicose veint 1 each 0  . rosuvastatin (CRESTOR) 5 MG tablet Take 1 tablet by mouth , MWF 90 tablet 2  . Semaglutide (RYBELSUS) 7 MG TABS Take 7 mg by mouth daily. 90 tablet 1  . olmesartan (BENICAR) 40 MG tablet Take 1 tablet (40 mg total) by mouth daily. 90 tablet 1   No facility-administered medications prior to visit.    Allergies  Allergen Reactions  . Metformin And Related   . Pravachol [Pravastatin Sodium] Other (See Comments)    Muscle aches  . Simvastatin     "leg pains"    Review of Systems  Constitutional: Positive for malaise/fatigue. Negative for chills and fever.  HENT: Negative for congestion and hearing loss.   Eyes: Negative for discharge.  Respiratory: Negative for cough, sputum production and shortness of breath.   Cardiovascular: Positive for palpitations (Occasional). Negative for  chest pain and leg swelling.  Gastrointestinal: Negative for abdominal pain, blood in stool, constipation, diarrhea, heartburn, nausea and vomiting.  Genitourinary: Negative for dysuria, frequency, hematuria and urgency.  Musculoskeletal: Negative for back pain, falls and myalgias.  Skin: Negative for rash.  Neurological: Positive for weakness. Negative for dizziness, sensory change, loss of consciousness and headaches.  Endo/Heme/Allergies: Negative for environmental allergies. Does not bruise/bleed easily.  Psychiatric/Behavioral: Negative for depression and suicidal ideas. The patient is not nervous/anxious and does not have insomnia.        Objective:    Physical Exam Vitals and nursing note reviewed.  Constitutional:      General: She is not in acute distress.    Appearance: Normal appearance. She is not ill-appearing.  HENT:     Head: Normocephalic and atraumatic.     Right Ear: External ear normal.     Left Ear: External ear normal.  Eyes:     Extraocular Movements: Extraocular movements intact.     Pupils: Pupils are equal, round, and reactive to light.  Cardiovascular:     Pulses: Normal pulses.     Heart sounds: Normal heart sounds. No murmur heard. No gallop.   Pulmonary:     Effort: Pulmonary effort is normal. No respiratory distress.     Breath sounds: Normal breath sounds. No wheezing, rhonchi or rales.  Skin:    General: Skin is warm and dry.  Neurological:     General: No focal deficit present.     Mental Status: She is alert and oriented to person, place, and time.     Motor: No weakness.     Coordination: Coordination normal.     Gait: Gait normal.     Deep Tendon Reflexes: Reflexes normal.  Psychiatric:        Mood and Affect: Mood normal.        Behavior: Behavior normal.        Thought Content: Thought content normal.        Judgment: Judgment normal.    ekg-- L atrial enlargement-- no change from 10/2017 BP 106/80   Pulse 67   Temp 98.4 F (36.9  C) (Oral)   Resp 18   Ht 5\' 8"  (1.727 m)   Wt 185 lb (83.9 kg)   BMI 28.13 kg/m  Wt Readings from Last 3 Encounters:  09/20/20 185 lb (83.9 kg)  05/19/20 182 lb (82.6 kg)  03/22/20 189 lb 3.2 oz (85.8 kg)    Diabetic Foot Exam - Simple   No data filed    Lab Results  Component Value Date  WBC 3.6 (L) 05/19/2020   HGB 14.4 05/19/2020   HCT 42.9 05/19/2020   PLT 181.0 05/19/2020   GLUCOSE 101 (H) 05/19/2020   CHOL 162 05/19/2020   TRIG 54.0 05/19/2020   HDL 74.40 05/19/2020   LDLDIRECT 120.9 08/02/2011   LDLCALC 77 05/19/2020   ALT 22 05/19/2020   AST 23 05/19/2020   NA 142 05/19/2020   K 3.6 05/19/2020   CL 103 05/19/2020   CREATININE 0.77 05/19/2020   BUN 10 05/19/2020   CO2 33 (H) 05/19/2020   TSH 1.86 05/19/2020   HGBA1C 6.1 05/19/2020   MICROALBUR 7.2 (H) 05/19/2020    Lab Results  Component Value Date   TSH 1.86 05/19/2020   Lab Results  Component Value Date   WBC 3.6 (L) 05/19/2020   HGB 14.4 05/19/2020   HCT 42.9 05/19/2020   MCV 94.5 05/19/2020   PLT 181.0 05/19/2020   Lab Results  Component Value Date   NA 142 05/19/2020   K 3.6 05/19/2020   CO2 33 (H) 05/19/2020   GLUCOSE 101 (H) 05/19/2020   BUN 10 05/19/2020   CREATININE 0.77 05/19/2020   BILITOT 0.8 05/19/2020   ALKPHOS 62 05/19/2020   AST 23 05/19/2020   ALT 22 05/19/2020   PROT 7.3 05/19/2020   ALBUMIN 4.5 05/19/2020   CALCIUM 9.7 05/19/2020   GFR 83.72 05/19/2020   Lab Results  Component Value Date   CHOL 162 05/19/2020   Lab Results  Component Value Date   HDL 74.40 05/19/2020   Lab Results  Component Value Date   LDLCALC 77 05/19/2020   Lab Results  Component Value Date   TRIG 54.0 05/19/2020   Lab Results  Component Value Date   CHOLHDL 2 05/19/2020   Lab Results  Component Value Date   HGBA1C 6.1 05/19/2020       Assessment & Plan:   Problem List Items Addressed This Visit      Unprioritized   Controlled type 2 diabetes mellitus with chronic  kidney disease, without long-term current use of insulin ([place]) - Primary   Relevant Medications   olmesartan (BENICAR) 20 MG tablet   Other Relevant Orders   TSH   CBC with Differential/Platelet   Lipid panel   Comprehensive metabolic panel   Hemoglobin A1c   Fatigue   Relevant Orders   TSH   CBC with Differential/Platelet   Lipid panel   Comprehensive metabolic panel   Hemoglobin A1c   Vitamin B12   Vitamin D (25 hydroxy)   CK (Creatine Kinase)   Hyperlipidemia associated with type 2 diabetes mellitus (HCC)   Relevant Medications   olmesartan (BENICAR) 20 MG tablet   Other Relevant Orders   Lipid panel   Comprehensive metabolic panel   Palpitations    ekg -- sinus-- atrial enlargement Check labs  Consider event monitor if no improvement       Relevant Orders   EKG 12-Lead (Completed)   Primary hypertension    Running low---  Dec benicar to 20 mg  Recheck 2-3 weeks       Relevant Medications   olmesartan (BENICAR) 20 MG tablet   Other Relevant Orders   TSH   CBC with Differential/Platelet   Lipid panel   Comprehensive metabolic panel   Hemoglobin A1c   Uncontrolled type 2 diabetes mellitus with hyperglycemia ([facility]CC)    Check labs today      Relevant Medications   olmesartan (BENICAR) 20 MG tablet  Meds ordered this encounter  Medications  . olmesartan (BENICAR) 20 MG tablet    Sig: Take 1 tablet (20 mg total) by mouth daily.    Dispense:  30 tablet    Refill:  2    I, Dr. Roma Schanz, DO, personally preformed the services described in this documentation.  All medical record entries made by the scribe were at my direction and in my presence.  I have reviewed the chart and discharge instructions (if applicable) and agree that the record reflects my personal performance and is accurate and complete. 09/20/2020   I,Angela Garner,acting as a scribe for [name], DO.,have documented all relevant documentation on the behalf of [name], DO,as directed by  [name], DO while in the presence of [name], DO.   [name], DO

## 2020-09-20 NOTE — Assessment & Plan Note (Signed)
Running low---  Dec benicar to 20 mg  Recheck 2-3 weeks

## 2020-09-20 NOTE — Assessment & Plan Note (Signed)
Check labs today.

## 2020-09-20 NOTE — Assessment & Plan Note (Signed)
Tolerating statin, encouraged heart healthy diet, avoid trans fats, minimize simple carbs and saturated fats. Increase exercise as tolerated 

## 2020-09-20 NOTE — Assessment & Plan Note (Signed)
ekg -- sinus-- atrial enlargement Check labs  Consider event monitor if no improvement

## 2020-09-20 NOTE — Assessment & Plan Note (Signed)
Check labs bp running low-- adjusted med and will recheck in 2-3 weeks

## 2020-09-20 NOTE — Patient Instructions (Signed)

## 2020-09-22 ENCOUNTER — Encounter: Payer: Self-pay | Admitting: Family Medicine

## 2020-09-23 ENCOUNTER — Other Ambulatory Visit: Payer: Self-pay | Admitting: Family Medicine

## 2020-09-23 DIAGNOSIS — R748 Abnormal levels of other serum enzymes: Secondary | ICD-10-CM

## 2020-09-26 NOTE — Progress Notes (Signed)
Can she do this at her appt in 2 weeks on the 21st?

## 2020-10-11 ENCOUNTER — Ambulatory Visit: Payer: BC Managed Care – PPO | Admitting: Family Medicine

## 2020-10-11 ENCOUNTER — Other Ambulatory Visit: Payer: Self-pay

## 2020-10-11 VITALS — BP 118/80 | HR 70 | Temp 98.3°F | Resp 18 | Ht 68.0 in | Wt 183.4 lb

## 2020-10-11 DIAGNOSIS — R002 Palpitations: Secondary | ICD-10-CM

## 2020-10-11 DIAGNOSIS — E1165 Type 2 diabetes mellitus with hyperglycemia: Secondary | ICD-10-CM | POA: Diagnosis not present

## 2020-10-11 DIAGNOSIS — I10 Essential (primary) hypertension: Secondary | ICD-10-CM | POA: Diagnosis not present

## 2020-10-11 DIAGNOSIS — R748 Abnormal levels of other serum enzymes: Secondary | ICD-10-CM | POA: Diagnosis not present

## 2020-10-11 DIAGNOSIS — G8929 Other chronic pain: Secondary | ICD-10-CM

## 2020-10-11 DIAGNOSIS — M545 Low back pain, unspecified: Secondary | ICD-10-CM | POA: Diagnosis not present

## 2020-10-11 DIAGNOSIS — R350 Frequency of micturition: Secondary | ICD-10-CM

## 2020-10-11 LAB — MICROALBUMIN / CREATININE URINE RATIO
Creatinine,U: 141.6 mg/dL
Microalb Creat Ratio: 0.5 mg/g (ref 0.0–30.0)
Microalb, Ur: <0.7 mg/dL (ref 0.0–1.9)

## 2020-10-11 LAB — CK: Total CK: 370 U/L — ABNORMAL HIGH (ref 7–177)

## 2020-10-11 MED ORDER — METHOCARBAMOL 500 MG PO TABS: 500.0000 mg | ORAL_TABLET | Freq: Four times a day (QID) | ORAL | 1 refills | Status: DC

## 2020-10-11 NOTE — Patient Instructions (Signed)

## 2020-10-11 NOTE — Progress Notes (Addendum)
Subjective:   By signing my name below, I, [name], attest that this documentation has been prepared under the direction and in the presence of Dr. Roma Schanz, DO. 10/11/2020     Patient ID: Angela Garner, female    DOB: 1961/03/11, 60 y.o.   MRN: [id number]  Chief Complaint  Patient presents with   Hypertension   Follow-up    HPI Patient is in today for a office visit. She continues to have occasional unexplained tachycardia. She notes having an episode of tachycardia during this visit. She continues to have lower right back pain. She thinks this pain is due to physical demands from her job. She takes tylenol to manage her pain and finds mild relief.  She continues to having frequency but denies any other urinary issues. She reports having a procedure on her foot to help stimulate her bladder and found that it did not improve her urinary incontinence. She continues taking 5 mg Crestor 3x weekly PO to manage her hyperlipidemia. She started eating healthier to help manage her cholesterol levels. Lab Results  Component Value Date   CHOL 184 09/20/2020   HDL 70.60 09/20/2020   LDLCALC 101 (H) 09/20/2020   LDLDIRECT 120.9 08/02/2011   TRIG 58.0 09/20/2020   CHOLHDL 3 09/20/2020   Her blood pressure measure is good during this visit. She reports that her blood pressure measures slightly higher while at home. She forgot to bring her blood pressure cuff to get it's accuracy tested today. She mentions having an episode of high blood pressure which measured 150/90 at home. She continues taking 5 mg amlodipine daily PO, 20 mg benicar daily PO, and 25 mg hydrochlorothiazide daily PO to manage her hypertension.  BP Readings from Last 3 Encounters:  10/11/20 118/80  09/20/20 106/80  05/19/20 120/88   Her blood sugar levels has not improved. She thinks that her 7 mg rybelsus daily PO is not working. She does not want to increase her dosage and prefers to diet and exercise to manage  her blood sugar. She complains of leg swelling and thinks this is due to taking 7 mg rybelsus daily PO. Her blood sugar ranges from 120-134 while at home.  Lab Results  Component Value Date   HGBA1C 6.2 09/20/2020    Past Medical History:  Diagnosis Date   Allergy    Diabetes mellitus without complication (HCC)    no meds, only check bs   Heart murmur    Hyperlipidemia    Hypertension     Past Surgical History:  Procedure Laterality Date   UTERINE FIBROID SURGERY      Family History  Problem Relation Age of Onset   Hypertension Father    Heart disease Father    Diabetes Father    Dementia Mother    Diabetes Sister        she had stomach pain-- cause unknown   Clotting disorder Brother    Colon cancer Neg Hx    Esophageal cancer Neg Hx    Stomach cancer Neg Hx    Rectal cancer Neg Hx     Social History   Socioeconomic History   Marital status: Legally Separated    Spouse name: Not on file   Number of children: Not on file   Years of education: Not on file   Highest education level: Not on file  Occupational History   Occupation: [employer]     Comment: [employer]  Tobacco Use   Smoking  status: Never   Smokeless tobacco: Never  Substance and Sexual Activity   Alcohol use: No   Drug use: No   Sexual activity: Yes  Other Topics Concern   Not on file  Social History Narrative   Not on file   Social Determinants of Health   Financial Resource Strain: Not on file  Food Insecurity: Not on file  Transportation Needs: Not on file  Physical Activity: Not on file  Stress: Not on file  Social Connections: Not on file  Intimate Partner Violence: Not on file    Outpatient Medications Prior to Visit  Medication Sig Dispense Refill   amLODipine (NORVASC) 5 MG tablet Take 1 tablet (5 mg total) by mouth daily. 90 tablet 3   diclofenac (VOLTAREN) 75 MG EC tablet Take 1 tablet (75 mg total) by mouth 2 (two) times daily. 60 tablet 1   hydrochlorothiazide  (HYDRODIURIL) 25 MG tablet 1/2 po qd 45 tablet 3   loratadine (CLARITIN) 10 MG tablet Take 1 tablet (10 mg total) by mouth daily. 30 tablet 11   multivitamin-iron-minerals-folic acid (CENTRUM) chewable tablet Chew 1 tablet by mouth daily.     NONFORMULARY OR COMPOUNDED ITEM Compression stocking -- thigh high  20-30 mm/hg #1  Dx varicose veint 1 each 0   olmesartan (BENICAR) 20 MG tablet Take 1 tablet (20 mg total) by mouth daily. 30 tablet 2   rosuvastatin (CRESTOR) 5 MG tablet Take 1 tablet by mouth , MWF 90 tablet 2   Semaglutide (RYBELSUS) 7 MG TABS Take 7 mg by mouth daily. 90 tablet 1   No facility-administered medications prior to visit.    Allergies  Allergen Reactions   Metformin And Related    Pravachol [Pravastatin Sodium] Other (See Comments)    Muscle aches   Simvastatin     "leg pains"    Review of Systems  Constitutional:  Negative for chills, fever and malaise/fatigue.  HENT:  Negative for congestion and hearing loss.   Eyes:  Negative for discharge.  Respiratory:  Negative for cough, sputum production and shortness of breath.   Cardiovascular:  Positive for palpitations. Negative for chest pain and leg swelling.       (+)tachycardia  Gastrointestinal:  Negative for abdominal pain, blood in stool, constipation, diarrhea, heartburn, nausea and vomiting.  Genitourinary:  Positive for frequency. Negative for dysuria, hematuria and urgency.  Musculoskeletal:  Positive for back pain (Lower right). Negative for falls and myalgias.  Skin:  Negative for rash.  Neurological:  Negative for dizziness, sensory change, loss of consciousness, weakness and headaches.  Endo/Heme/Allergies:  Negative for environmental allergies. Does not bruise/bleed easily.  Psychiatric/Behavioral:  Negative for depression and suicidal ideas. The patient is not nervous/anxious and does not have insomnia.       Objective:    Physical Exam Constitutional:      General: She is not in acute  distress.    Appearance: Normal appearance. She is not ill-appearing.  HENT:     Head: Normocephalic and atraumatic.     Right Ear: External ear normal.     Left Ear: External ear normal.  Eyes:     Extraocular Movements: Extraocular movements intact.     Pupils: Pupils are equal, round, and reactive to light.  Cardiovascular:     Rate and Rhythm: Normal rate and regular rhythm.     Pulses: Normal pulses.     Heart sounds: Normal heart sounds. No murmur heard.   No gallop.  Pulmonary:  Effort: Pulmonary effort is normal. No respiratory distress.     Breath sounds: Normal breath sounds. No wheezing, rhonchi or rales.  Skin:    General: Skin is warm and dry.  Neurological:     Mental Status: She is alert and oriented to person, place, and time.  Psychiatric:        Behavior: Behavior normal.    BP 118/80 (BP Location: Right Arm, Patient Position: Sitting, Cuff Size: Normal)   Pulse 70   Temp 98.3 F (36.8 C) (Oral)   Resp 18   Ht 5\' 8"  (1.727 m)   Wt 183 lb 6.4 oz (83.2 kg)   SpO2 99%   BMI 27.89 kg/m  Wt Readings from Last 3 Encounters:  10/11/20 183 lb 6.4 oz (83.2 kg)  09/20/20 185 lb (83.9 kg)  05/19/20 182 lb (82.6 kg)    Diabetic Foot Exam - Simple   No data filed    Lab Results  Component Value Date   WBC 4.5 09/20/2020   HGB 14.7 09/20/2020   HCT 43.8 09/20/2020   PLT 181.0 09/20/2020   GLUCOSE 114 (H) 09/20/2020   CHOL 184 09/20/2020   TRIG 58.0 09/20/2020   HDL 70.60 09/20/2020   LDLDIRECT 120.9 08/02/2011   LDLCALC 101 (H) 09/20/2020   ALT 17 09/20/2020   AST 17 09/20/2020   NA 142 09/20/2020   K 3.7 09/20/2020   CL 104 09/20/2020   CREATININE 0.77 09/20/2020   BUN 15 09/20/2020   CO2 30 09/20/2020   TSH 2.98 09/20/2020   HGBA1C 6.2 09/20/2020   MICROALBUR 7.2 (H) 05/19/2020    Lab Results  Component Value Date   TSH 2.98 09/20/2020   Lab Results  Component Value Date   WBC 4.5 09/20/2020   HGB 14.7 09/20/2020   HCT 43.8  09/20/2020   MCV 93.9 09/20/2020   PLT 181.0 09/20/2020   Lab Results  Component Value Date   NA 142 09/20/2020   K 3.7 09/20/2020   CO2 30 09/20/2020   GLUCOSE 114 (H) 09/20/2020   BUN 15 09/20/2020   CREATININE 0.77 09/20/2020   BILITOT 0.7 09/20/2020   ALKPHOS 71 09/20/2020   AST 17 09/20/2020   ALT 17 09/20/2020   PROT 6.9 09/20/2020   ALBUMIN 4.4 09/20/2020   CALCIUM 9.6 09/20/2020   GFR 83.52 09/20/2020   Lab Results  Component Value Date   CHOL 184 09/20/2020   Lab Results  Component Value Date   HDL 70.60 09/20/2020   Lab Results  Component Value Date   LDLCALC 101 (H) 09/20/2020   Lab Results  Component Value Date   TRIG 58.0 09/20/2020   Lab Results  Component Value Date   CHOLHDL 3 09/20/2020   Lab Results  Component Value Date   HGBA1C 6.2 09/20/2020       Assessment & Plan:   Problem List Items Addressed This Visit       Unprioritized   Urinary frequency   Relevant Orders   POCT Urinalysis Dipstick (Automated)   Palpitations    Check event monitor and echo Refer to cardiology       Relevant Orders   Cardiac event monitor   ECHOCARDIOGRAM COMPLETE   Ambulatory referral to Cardiology   Primary hypertension    Well controlled, no changes to meds. Encouraged heart healthy diet such as the DASH diet and exercise as tolerated.        Uncontrolled type 2 diabetes mellitus with hyperglycemia ([place])  Pt refusing to inc rybelsus right now Lab Results  Component Value Date   HGBA1C 6.2 09/20/2020          Relevant Orders   Microalbumin / creatinine urine ratio   Other Visit Diagnoses     Elevated CPK    -  Primary   Relevant Orders   CK   Chronic right-sided low back pain without sciatica       Relevant Medications   methocarbamol (ROBAXIN) 500 MG tablet        Meds ordered this encounter  Medications   methocarbamol (ROBAXIN) 500 MG tablet    Sig: Take 1 tablet (500 mg total) by mouth 4 (four) times daily.     Dispense:  45 tablet    Refill:  1    I, Dr. Roma Schanz, DO, personally preformed the services described in this documentation.  All medical record entries made by the scribe were at my direction and in my presence.  I have reviewed the chart and discharge instructions (if applicable) and agree that the record reflects my personal performance and is accurate and complete. 10/11/2020   I,[name],acting as a Education administrator for [employer], DO.,have documented all relevant documentation on the behalf of [name], DO,as directed by  [name], DO while in the presence of [name], DO.   [name], DO

## 2020-10-11 NOTE — Assessment & Plan Note (Signed)
Pt refusing to inc rybelsus right now Lab Results  Component Value Date   HGBA1C 6.2 09/20/2020

## 2020-10-11 NOTE — Assessment & Plan Note (Addendum)
Check event monitor and echo Refer to cardiology ekg done last ov

## 2020-10-11 NOTE — Assessment & Plan Note (Signed)
Well controlled, no changes to meds. Encouraged heart healthy diet such as the DASH diet and exercise as tolerated.  °

## 2020-10-12 ENCOUNTER — Encounter: Payer: Self-pay | Admitting: Family Medicine

## 2020-10-12 ENCOUNTER — Other Ambulatory Visit: Payer: Self-pay | Admitting: Family Medicine

## 2020-10-12 DIAGNOSIS — M791 Myalgia, unspecified site: Secondary | ICD-10-CM

## 2020-10-31 ENCOUNTER — Other Ambulatory Visit: Payer: Self-pay

## 2020-10-31 ENCOUNTER — Ambulatory Visit (HOSPITAL_BASED_OUTPATIENT_CLINIC_OR_DEPARTMENT_OTHER)
Admission: RE | Admit: 2020-10-31 | Discharge: 2020-10-31 | Disposition: A | Discharge status: Home / Self Care | Payer: BC Managed Care – PPO | Source: Ambulatory Visit | Attending: Family Medicine | Admitting: Family Medicine

## 2020-10-31 DIAGNOSIS — R002 Palpitations: Secondary | ICD-10-CM | POA: Diagnosis not present

## 2020-10-31 LAB — ECHOCARDIOGRAM COMPLETE
AR max vel: 3.58 cm2
AV Area VTI: 3.22 cm2
AV Area mean vel: 2.88 cm2
AV Mean grad: 5 mmHg
AV Peak grad: 9.1 mmHg
Ao pk vel: 1.51 m/s
Area-P 1/2: 3.06 cm2
Calc EF: 68.2 %
MV M vel: 4.77 m/s
MV Peak grad: 91 mmHg
Radius: 0.4 cm
S' Lateral: 2.45 cm
Single Plane A2C EF: 76.2 %
Single Plane A4C EF: 54.9 %

## 2020-11-17 ENCOUNTER — Encounter: Payer: Self-pay | Admitting: Family Medicine

## 2020-11-17 ENCOUNTER — Ambulatory Visit: Payer: BC Managed Care – PPO | Admitting: Family Medicine

## 2020-11-17 ENCOUNTER — Other Ambulatory Visit: Payer: Self-pay

## 2020-11-17 VITALS — BP 120/80 | HR 76 | Temp 98.3°F | Resp 18 | Ht 68.0 in | Wt 179.8 lb

## 2020-11-17 DIAGNOSIS — I10 Essential (primary) hypertension: Secondary | ICD-10-CM | POA: Diagnosis not present

## 2020-11-17 DIAGNOSIS — R748 Abnormal levels of other serum enzymes: Secondary | ICD-10-CM | POA: Diagnosis not present

## 2020-11-17 DIAGNOSIS — R002 Palpitations: Secondary | ICD-10-CM

## 2020-11-17 DIAGNOSIS — E1169 Type 2 diabetes mellitus with other specified complication: Secondary | ICD-10-CM | POA: Diagnosis not present

## 2020-11-17 DIAGNOSIS — E785 Hyperlipidemia, unspecified: Secondary | ICD-10-CM

## 2020-11-17 LAB — COMPREHENSIVE METABOLIC PANEL
ALT: 19 U/L (ref 0–35)
AST: 22 U/L (ref 0–37)
Albumin: 4.4 g/dL (ref 3.5–5.2)
Alkaline Phosphatase: 73 U/L (ref 39–117)
BUN: 10 mg/dL (ref 6–23)
CO2: 30 mEq/L (ref 19–32)
Calcium: 9.6 mg/dL (ref 8.4–10.5)
Chloride: 102 mEq/L (ref 96–112)
Creatinine, Ser: 0.85 mg/dL (ref 0.40–1.20)
GFR: 74.09 mL/min (ref >60.00)
Glucose, Bld: 91 mg/dL (ref 70–99)
Potassium: 3.6 mEq/L (ref 3.5–5.1)
Sodium: 140 mEq/L (ref 135–145)
Total Bilirubin: 0.6 mg/dL (ref 0.2–1.2)
Total Protein: 7.2 g/dL (ref 6.0–8.3)

## 2020-11-17 LAB — LIPID PANEL
Cholesterol: 201 mg/dL — ABNORMAL HIGH (ref 0–200)
HDL: 80.1 mg/dL (ref >39.00)
LDL Cholesterol: 110 mg/dL — ABNORMAL HIGH (ref 0–99)
NonHDL: 120.82
Total CHOL/HDL Ratio: 3
Triglycerides: 53 mg/dL (ref 0.0–149.0)
VLDL: 10.6 mg/dL (ref 0.0–40.0)

## 2020-11-17 LAB — CK: Total CK: 263 U/L — ABNORMAL HIGH (ref 7–177)

## 2020-11-17 MED ORDER — OLMESARTAN MEDOXOMIL 20 MG PO TABS: 20.0000 mg | ORAL_TABLET | Freq: Every day | ORAL | 1 refills | Status: DC

## 2020-11-17 NOTE — Assessment & Plan Note (Signed)
Encourage heart healthy diet such as MIND or DASH diet, increase exercise, avoid trans fats, simple carbohydrates and processed foods, consider a krill or fish or flaxseed oil cap daily.  °

## 2020-11-17 NOTE — Progress Notes (Signed)
Subjective:   By signing my name below, I, [name], attest that this documentation has been prepared under the direction and in the presence of Dr. Roma Schanz, DO. 11/17/2020    Patient ID: Angela Garner, female    DOB: [birth year], 60 y.o.   MRN: [id number]  Chief Complaint  Patient presents with   Hypertension   Hyperlipidemia   Diabetes   Follow-up    HPI Patient is in today for a office visit. She reports having knee pain while taking 5 mg Crestor daily PO. She was found to have elevated CPK enzyme in her last lab work. Since then she has stopped taking 5 mg Crestor daily PO and reports that her knee pain had went away.  She reports that her palpations have went away and so she is not going to set up an appointment with her cardiologist anymore.  She has 3 Covid-19 vaccines at this time and is willing to get the 2nd booster vaccine at a later date.  Her blood pressure is doing well during this visit. She continues taking 5 mg amlodipine daily PO, 25 mg hydrochlorothiazide daily PO, and 20 mg benicar daily PO and reports no new issues while taking them. She denies having any leg swelling at this time.  BP Readings from Last 3 Encounters:  11/17/20 120/80  10/11/20 118/80  09/20/20 106/80   She regularly monitors her blood sugars everyday at home and reports they are good. She continues taking 7 mg Rybelsus daily PO and reports having no new symptoms. Lab Results  Component Value Date   HGBA1C 6.2 09/20/2020    Past Medical History:  Diagnosis Date   Allergy    Diabetes mellitus without complication (HCC)    no meds, only check bs   Heart murmur    Hyperlipidemia    Hypertension     Past Surgical History:  Procedure Laterality Date   UTERINE FIBROID SURGERY      Family History  Problem Relation Age of Onset   Hypertension Father    Heart disease Father    Diabetes Father    Dementia Mother    Diabetes Sister        she had stomach pain-- cause  unknown   Clotting disorder Brother    Colon cancer Neg Hx    Esophageal cancer Neg Hx    Stomach cancer Neg Hx    Rectal cancer Neg Hx     Social History   Socioeconomic History   Marital status: Legally Separated    Spouse name: Not on file   Number of children: Not on file   Years of education: Not on file   Highest education level: Not on file  Occupational History   Occupation: [employer]     Comment: [employer]  Tobacco Use   Smoking status: Never   Smokeless tobacco: Never  Substance and Sexual Activity   Alcohol use: No   Drug use: No   Sexual activity: Yes  Other Topics Concern   Not on file  Social History Narrative   Not on file   Social Determinants of Health   Financial Resource Strain: Not on file  Food Insecurity: Not on file  Transportation Needs: Not on file  Physical Activity: Not on file  Stress: Not on file  Social Connections: Not on file  Intimate Partner Violence: Not on file    Outpatient Medications Prior to Visit  Medication Sig Dispense Refill   amLODipine ([place])  5 MG tablet Take 1 tablet (5 mg total) by mouth daily. 90 tablet 3   diclofenac (VOLTAREN) 75 MG EC tablet Take 1 tablet (75 mg total) by mouth 2 (two) times daily. 60 tablet 1   hydrochlorothiazide (HYDRODIURIL) 25 MG tablet 1/2 po qd 45 tablet 3   loratadine (CLARITIN) 10 MG tablet Take 1 tablet (10 mg total) by mouth daily. 30 tablet 11   methocarbamol (ROBAXIN) 500 MG tablet Take 1 tablet (500 mg total) by mouth 4 (four) times daily. 45 tablet 1   multivitamin-iron-minerals-folic acid (CENTRUM) chewable tablet Chew 1 tablet by mouth daily.     NONFORMULARY OR COMPOUNDED ITEM Compression stocking -- thigh high  20-30 mm/hg #1  Dx varicose veint 1 each 0   rosuvastatin (CRESTOR) 5 MG tablet Take 1 tablet by mouth , MWF 90 tablet 2   Semaglutide (RYBELSUS) 7 MG TABS Take 7 mg by mouth daily. 90 tablet 1   olmesartan (BENICAR) 20 MG tablet Take 1 tablet (20 mg  total) by mouth daily. 30 tablet 2   No facility-administered medications prior to visit.    Allergies  Allergen Reactions   Metformin And Related    Pravachol [Pravastatin Sodium] Other (See Comments)    Muscle aches   Simvastatin     "leg pains"    Review of Systems  Constitutional:  Negative for fever and malaise/fatigue.  HENT:  Negative for congestion.   Eyes:  Negative for blurred vision.  Respiratory:  Negative for cough and shortness of breath.   Cardiovascular:  Negative for chest pain, palpitations and leg swelling.  Gastrointestinal:  Negative for abdominal pain, blood in stool, nausea and vomiting.  Genitourinary:  Negative for dysuria and frequency.  Musculoskeletal:  Negative for back pain and falls.  Skin:  Negative for rash.  Neurological:  Negative for dizziness, loss of consciousness and headaches.  Endo/Heme/Allergies:  Negative for environmental allergies.  Psychiatric/Behavioral:  Negative for depression. The patient is not nervous/anxious.       Objective:    Physical Exam Vitals and nursing note reviewed.  Constitutional:      General: She is not in acute distress.    Appearance: Normal appearance. She is not ill-appearing.  HENT:     Head: Normocephalic and atraumatic.     Right Ear: External ear normal.     Left Ear: External ear normal.  Eyes:     Extraocular Movements: Extraocular movements intact.     Pupils: Pupils are equal, round, and reactive to light.  Cardiovascular:     Rate and Rhythm: Normal rate and regular rhythm.     Pulses: Normal pulses.     Heart sounds: Normal heart sounds. No murmur heard.   No gallop.  Pulmonary:     Effort: Pulmonary effort is normal. No respiratory distress.     Breath sounds: Normal breath sounds. No wheezing or rales.  Skin:    General: Skin is warm and dry.  Neurological:     Mental Status: She is alert and oriented to person, place, and time.  Psychiatric:        Behavior: Behavior normal.     BP 120/80 (BP Location: Right Arm, Patient Position: Sitting, Cuff Size: Normal)   Pulse 76   Temp 98.3 F (36.8 C) (Oral)   Resp 18   Ht '5\' 8"'$  (1.727 m)   Wt 179 lb 12.8 oz (81.6 kg)   SpO2 100%   BMI 27.34 kg/m  Wt Readings from Last  3 Encounters:  11/17/20 179 lb 12.8 oz (81.6 kg)  10/11/20 183 lb 6.4 oz (83.2 kg)  09/20/20 185 lb (83.9 kg)    Diabetic Foot Exam - Simple   No data filed    Lab Results  Component Value Date   WBC 4.5 09/20/2020   HGB 14.7 09/20/2020   HCT 43.8 09/20/2020   PLT 181.0 09/20/2020   GLUCOSE 114 (H) 09/20/2020   CHOL 184 09/20/2020   TRIG 58.0 09/20/2020   HDL 70.60 09/20/2020   LDLDIRECT 120.9 08/02/2011   LDLCALC 101 (H) 09/20/2020   ALT 17 09/20/2020   AST 17 09/20/2020   NA 142 09/20/2020   K 3.7 09/20/2020   CL 104 09/20/2020   CREATININE 0.77 09/20/2020   BUN 15 09/20/2020   CO2 30 09/20/2020   TSH 2.98 09/20/2020   HGBA1C 6.2 09/20/2020   MICROALBUR <0.7 10/11/2020    Lab Results  Component Value Date   TSH 2.98 09/20/2020   Lab Results  Component Value Date   WBC 4.5 09/20/2020   HGB 14.7 09/20/2020   HCT 43.8 09/20/2020   MCV 93.9 09/20/2020   PLT 181.0 09/20/2020   Lab Results  Component Value Date   NA 142 09/20/2020   K 3.7 09/20/2020   CO2 30 09/20/2020   GLUCOSE 114 (H) 09/20/2020   BUN 15 09/20/2020   CREATININE 0.77 09/20/2020   BILITOT 0.7 09/20/2020   ALKPHOS 71 09/20/2020   AST 17 09/20/2020   ALT 17 09/20/2020   PROT 6.9 09/20/2020   ALBUMIN 4.4 09/20/2020   CALCIUM 9.6 09/20/2020   GFR 83.52 09/20/2020   Lab Results  Component Value Date   CHOL 184 09/20/2020   Lab Results  Component Value Date   HDL 70.60 09/20/2020   Lab Results  Component Value Date   LDLCALC 101 (H) 09/20/2020   Lab Results  Component Value Date   TRIG 58.0 09/20/2020   Lab Results  Component Value Date   CHOLHDL 3 09/20/2020   Lab Results  Component Value Date   HGBA1C 6.2 09/20/2020        Assessment & Plan:   Problem List Items Addressed This Visit       Unprioritized   Essential hypertension    Well controlled, no changes to meds. Encouraged heart healthy diet such as the DASH diet and exercise as tolerated.        Relevant Medications   olmesartan (BENICAR) 20 MG tablet   Hyperlipidemia associated with type 2 diabetes mellitus ([place]) - Primary    Encourage heart healthy diet such as MIND or DASH diet, increase exercise, avoid trans fats, simple carbohydrates and processed foods, consider a krill or fish or flaxseed oil cap daily.        Relevant Medications   olmesartan (BENICAR) 20 MG tablet   Other Relevant Orders   Lipid panel   Comprehensive metabolic panel   CK   Palpitations    Resolved        Primary hypertension    Well controlled, no changes to meds. Encouraged heart healthy diet such as the DASH diet and exercise as tolerated.        Relevant Medications   olmesartan (BENICAR) 20 MG tablet   Other Relevant Orders   Comprehensive metabolic panel   Other Visit Diagnoses     Elevated CPK       Relevant Orders   CK        Meds ordered this encounter  Medications   olmesartan (BENICAR) 20 MG tablet    Sig: Take 1 tablet (20 mg total) by mouth daily.    Dispense:  90 tablet    Refill:  1    I, Dr. Roma Schanz, DO, personally preformed the services described in this documentation.  All medical record entries made by the scribe were at my direction and in my presence.  I have reviewed the chart and discharge instructions (if applicable) and agree that the record reflects my personal performance and is accurate and complete. 11/17/2020   I,[name],acting as a scribe for [employer], DO.,have documented all relevant documentation on the behalf of [name], DO,as directed by  [name], DO while in the presence of [name], DO.   [name], DO

## 2020-11-17 NOTE — Assessment & Plan Note (Signed)
Resolved

## 2020-11-17 NOTE — Patient Instructions (Signed)
https://www.diabeteseducator.org/docs/default-source/living-with-diabetes/conquering-the-grocery-store-v1.pdf?sfvrsn=4">  Carbohydrate Counting for Diabetes Mellitus, Adult Carbohydrate counting is a method of keeping track of how many carbohydrates you eat. Eating carbohydrates naturally increases the amount of sugar (glucose) in the blood. Counting how many carbohydrates you eat improves your bloodglucose control, which helps you manage your diabetes. It is important to know how many carbohydrates you can safely have in each meal. This is different for every person. A dietitian can help you make a meal plan and calculate how many carbohydrates you should have at each meal andsnack. What foods contain carbohydrates? Carbohydrates are found in the following foods: Grains, such as breads and cereals. Dried beans and soy products. Starchy vegetables, such as potatoes, peas, and corn. Fruit and fruit juices. Milk and yogurt. Sweets and snack foods, such as cake, cookies, candy, chips, and soft drinks. How do I count carbohydrates in foods? There are two ways to count carbohydrates in food. You can read food labels or learn standard serving sizes of foods. You can use either of the methods or acombination of both. Using the Nutrition Facts label The Nutrition Facts list is included on the labels of almost all packaged foods and beverages in the [place] It includes: The serving size. Information about nutrients in each serving, including the grams (g) of carbohydrate per serving. To use the Nutrition Facts: Decide how many servings you will have. Multiply the number of servings by the number of carbohydrates per serving. The resulting number is the total amount of carbohydrates that you will be having. Learning the standard serving sizes of foods When you eat carbohydrate foods that are not packaged or do not include Nutrition Facts on the label, you need to measure the servings in order to count the  amount of carbohydrates. Measure the foods that you will eat with a food scale or measuring cup, if needed. Decide how many standard-size servings you will eat. Multiply the number of servings by 15. For foods that contain carbohydrates, one serving equals 15 g of carbohydrates. For example, if you eat 2 cups or 10 oz (300 g) of strawberries, you will have eaten 2 servings and 30 g of carbohydrates (2 servings x 15 g = 30 g). For foods that have more than one food mixed, such as soups and casseroles, you must count the carbohydrates in each food that is included. The following list contains standard serving sizes of common carbohydrate-rich foods. Each of these servings has about 15 g of carbohydrates: 1 slice of bread. 1 six-inch (15 cm) tortilla. ? cup or 2 oz (53 g) cooked rice or pasta.  cup or 3 oz (85 g) cooked or canned, drained and rinsed beans or lentils.  cup or 3 oz (85 g) starchy vegetable, such as peas, corn, or squash.  cup or 4 oz (120 g) hot cereal.  cup or 3 oz (85 g) boiled or mashed potatoes, or  or 3 oz (85 g) of a large baked potato.  cup or 4 fl oz (118 mL) fruit juice. 1 cup or 8 fl oz (237 mL) milk. 1 small or 4 oz (106 g) apple.  or 2 oz (63 g) of a medium banana. 1 cup or 5 oz (150 g) strawberries. 3 cups or 1 oz (24 g) popped popcorn. What is an example of carbohydrate counting? To calculate the number of carbohydrates in this sample meal, follow the stepsshown below. Sample meal 3 oz (85 g) chicken breast. ? cup or 4 oz (106 g) brown rice.    cup or 3 oz (85 g) corn. 1 cup or 8 fl oz (237 mL) milk. 1 cup or 5 oz (150 g) strawberries with sugar-free whipped topping. Carbohydrate calculation Identify the foods that contain carbohydrates: Rice. Corn. Milk. Strawberries. Calculate how many servings you have of each food: 2 servings rice. 1 serving corn. 1 serving milk. 1 serving strawberries. Multiply each number of servings by 15 g: 2 servings  rice x 15 g = 30 g. 1 serving corn x 15 g = 15 g. 1 serving milk x 15 g = 15 g. 1 serving strawberries x 15 g = 15 g. Add together all of the amounts to find the total grams of carbohydrates eaten: 30 g + 15 g + 15 g + 15 g = 75 g of carbohydrates total. What are tips for following this plan? Shopping Develop a meal plan and then make a shopping list. Buy fresh and frozen vegetables, fresh and frozen fruit, dairy, eggs, beans, lentils, and whole grains. Look at food labels. Choose foods that have more fiber and less sugar. Avoid processed foods and foods with added sugars. Meal planning Aim to have the same amount of carbohydrates at each meal and for each snack time. Plan to have regular, balanced meals and snacks. Where to find more information American Diabetes Association: www.diabetes.org Centers for Disease Control and Prevention: www.cdc.gov Summary Carbohydrate counting is a method of keeping track of how many carbohydrates you eat. Eating carbohydrates naturally increases the amount of sugar (glucose) in the blood. Counting how many carbohydrates you eat improves your blood glucose control, which helps you manage your diabetes. A dietitian can help you make a meal plan and calculate how many carbohydrates you should have at each meal and snack. This information is not intended to replace advice given to you by your health care provider. Make sure you discuss any questions you have with your healthcare provider. Document Revised: 04/09/2019 Document Reviewed: 04/10/2019 Elsevier Patient Education  2021 Elsevier Inc.  

## 2020-11-17 NOTE — Assessment & Plan Note (Signed)
Well controlled, no changes to meds. Encouraged heart healthy diet such as the DASH diet and exercise as tolerated.  °

## 2020-11-21 ENCOUNTER — Other Ambulatory Visit: Payer: Self-pay | Admitting: Family Medicine

## 2020-11-21 DIAGNOSIS — E785 Hyperlipidemia, unspecified: Secondary | ICD-10-CM

## 2020-11-21 DIAGNOSIS — R748 Abnormal levels of other serum enzymes: Secondary | ICD-10-CM

## 2020-12-01 ENCOUNTER — Other Ambulatory Visit: Payer: Self-pay | Admitting: Family Medicine

## 2020-12-11 ENCOUNTER — Other Ambulatory Visit: Payer: Self-pay | Admitting: Family Medicine

## 2020-12-11 DIAGNOSIS — I10 Essential (primary) hypertension: Secondary | ICD-10-CM

## 2020-12-21 ENCOUNTER — Ambulatory Visit: Payer: BC Managed Care – PPO | Admitting: Internal Medicine

## 2021-01-12 ENCOUNTER — Other Ambulatory Visit: Payer: Self-pay

## 2021-01-12 ENCOUNTER — Ambulatory Visit: Payer: BC Managed Care – PPO | Admitting: Family Medicine

## 2021-01-12 VITALS — BP 122/88 | HR 66 | Temp 98.2°F | Resp 16 | Wt 184.0 lb

## 2021-01-12 DIAGNOSIS — E1169 Type 2 diabetes mellitus with other specified complication: Secondary | ICD-10-CM

## 2021-01-12 DIAGNOSIS — I10 Essential (primary) hypertension: Secondary | ICD-10-CM | POA: Diagnosis not present

## 2021-01-12 DIAGNOSIS — R748 Abnormal levels of other serum enzymes: Secondary | ICD-10-CM | POA: Diagnosis not present

## 2021-01-12 DIAGNOSIS — R350 Frequency of micturition: Secondary | ICD-10-CM | POA: Diagnosis not present

## 2021-01-12 DIAGNOSIS — E1165 Type 2 diabetes mellitus with hyperglycemia: Secondary | ICD-10-CM

## 2021-01-12 DIAGNOSIS — Z23 Encounter for immunization: Secondary | ICD-10-CM | POA: Diagnosis not present

## 2021-01-12 DIAGNOSIS — E785 Hyperlipidemia, unspecified: Secondary | ICD-10-CM

## 2021-01-12 LAB — COMPREHENSIVE METABOLIC PANEL
ALT: 18 U/L (ref 0–35)
AST: 21 U/L (ref 0–37)
Albumin: 4.3 g/dL (ref 3.5–5.2)
Alkaline Phosphatase: 71 U/L (ref 39–117)
BUN: 19 mg/dL (ref 6–23)
CO2: 31 mEq/L (ref 19–32)
Calcium: 9.3 mg/dL (ref 8.4–10.5)
Chloride: 104 mEq/L (ref 96–112)
Creatinine, Ser: 0.88 mg/dL (ref 0.40–1.20)
GFR: 70.99 mL/min (ref >60.00)
Glucose, Bld: 96 mg/dL (ref 70–99)
Potassium: 3.6 mEq/L (ref 3.5–5.1)
Sodium: 142 mEq/L (ref 135–145)
Total Bilirubin: 0.7 mg/dL (ref 0.2–1.2)
Total Protein: 7 g/dL (ref 6.0–8.3)

## 2021-01-12 LAB — POC URINALSYSI DIPSTICK (AUTOMATED)
Bilirubin, UA: NEGATIVE
Blood, UA: NEGATIVE
Glucose, UA: NEGATIVE
Ketones, UA: NEGATIVE
Leukocytes, UA: NEGATIVE
Nitrite, UA: NEGATIVE
Protein, UA: NEGATIVE
Spec Grav, UA: 1.01 (ref 1.010–1.025)
Urobilinogen, UA: 0.2 E.U./dL
pH, UA: 6.5 (ref 5.0–8.0)

## 2021-01-12 LAB — CK: Total CK: 230 U/L — ABNORMAL HIGH (ref 7–177)

## 2021-01-12 LAB — LIPID PANEL
Cholesterol: 192 mg/dL (ref 0–200)
HDL: 77.9 mg/dL (ref >39.00)
LDL Cholesterol: 104 mg/dL — ABNORMAL HIGH (ref 0–99)
NonHDL: 114.46
Total CHOL/HDL Ratio: 2
Triglycerides: 51 mg/dL (ref 0.0–149.0)
VLDL: 10.2 mg/dL (ref 0.0–40.0)

## 2021-01-12 MED ORDER — RYBELSUS 7 MG PO TABS: 1.0000 | ORAL_TABLET | Freq: Every day | ORAL | 1 refills | Status: DC

## 2021-01-12 NOTE — Assessment & Plan Note (Signed)
Check ua today

## 2021-01-12 NOTE — Assessment & Plan Note (Signed)
Encourage heart healthy diet such as MIND or DASH diet, increase exercise, avoid trans fats, simple carbohydrates and processed foods, consider a krill or fish or flaxseed oil cap daily.  °

## 2021-01-12 NOTE — Assessment & Plan Note (Signed)
Check labs 

## 2021-01-12 NOTE — Progress Notes (Signed)
Subjective:   By signing my name below, I, Larwance Rote, attest that this documentation has been prepared under the direction and in the presence of [name], DO  01/12/2021   Patient ID: Angela Garner, female    DOB: 1960-04-25, 60 y.o.   MRN: [id number]  Chief Complaint  Patient presents with   Hypertension    Here for follow up   Diabetes    Here for follow up   Hyperlipidemia    Here for follow up,     HPI Patient is in today for office visit.  She complains of urgency. Associated symptoms are itching. No discharge. She reports it could be due her cholesterol level.  She is asking refill for Rybelsus today. She denies any muscle ache at this time. She states her blood sugar is stable at home.  It is usually around 109-110  in the morning.  She continues taking 5 mg amlodipine daily PO, 25 mg hydrochlorothiazide daily PO, and 20 mg benicar daily PO and reports no new issues while taking them. She continues having swelling in her right leg.  She is not interesting to receive flu vaccine.   Past Medical History:  Diagnosis Date   Allergy    Diabetes mellitus without complication (HCC)    no meds, only check bs   Heart murmur    Hyperlipidemia    Hypertension     Past Surgical History:  Procedure Laterality Date   UTERINE FIBROID SURGERY      Family History  Problem Relation Age of Onset   Hypertension Father    Heart disease Father    Diabetes Father    Dementia Mother    Diabetes Sister        she had stomach pain-- cause unknown   Clotting disorder Brother    Colon cancer Neg Hx    Esophageal cancer Neg Hx    Stomach cancer Neg Hx    Rectal cancer Neg Hx     Social History   Socioeconomic History   Marital status: Legally Separated    Spouse name: Not on file   Number of children: Not on file   Years of education: Not on file   Highest education level: Not on file  Occupational History   Occupation: [employer]     Comment: [employer]  Tobacco Use   Smoking status: Never   Smokeless tobacco: Never  Substance and Sexual Activity   Alcohol use: No   Drug use: No   Sexual activity: Yes  Other Topics Concern   Not on file  Social History Narrative   Not on file   Social Determinants of Health   Financial Resource Strain: Not on file  Food Insecurity: Not on file  Transportation Needs: Not on file  Physical Activity: Not on file  Stress: Not on file  Social Connections: Not on file  Intimate Partner Violence: Not on file    Outpatient Medications Prior to Visit  Medication Sig Dispense Refill   amLODipine (NORVASC) 5 MG tablet Take 1 tablet by mouth once daily 90 tablet 1   diclofenac (VOLTAREN) 75 MG EC tablet Take 1 tablet (75 mg total) by mouth 2 (two) times daily. 60 tablet 1   hydrochlorothiazide (HYDRODIURIL) 25 MG tablet 1/2 po qd 45 tablet 3   loratadine (CLARITIN) 10 MG tablet Take 1 tablet (10 mg total) by mouth daily. 30 tablet 11   methocarbamol (ROBAXIN) 500 MG tablet Take  1 tablet (500 mg total) by mouth 4 (four) times daily. 45 tablet 1   multivitamin-iron-minerals-folic acid (CENTRUM) chewable tablet Chew 1 tablet by mouth daily.     NONFORMULARY OR COMPOUNDED ITEM Compression stocking -- thigh high  20-30 mm/hg #1  Dx varicose veint 1 each 0   olmesartan (BENICAR) 20 MG tablet Take 1 tablet (20 mg total) by mouth daily. 90 tablet 1   RYBELSUS 7 MG TABS Take 1 tablet by mouth once daily 90 tablet 0   rosuvastatin (CRESTOR) 5 MG tablet Take 1 tablet by mouth , MWF (Patient not taking: Reported on 01/12/2021) 90 tablet 2   No facility-administered medications prior to visit.    Allergies  Allergen Reactions   Metformin And Related    Pravachol [Pravastatin Sodium] Other (See Comments)    Muscle aches   Simvastatin     "leg pains"    Review of Systems  Cardiovascular:  Positive for leg swelling (Right leg).  Genitourinary:  Positive for urgency.  Musculoskeletal:  Negative for  myalgias.  Skin:  Positive for itching (Vaginal area).      Objective:    Physical Exam Constitutional:      General: She is not in acute distress.    Appearance: Normal appearance. She is not ill-appearing.  HENT:     Head: Normocephalic and atraumatic.     Right Ear: External ear normal.     Left Ear: External ear normal.  Eyes:     Extraocular Movements: Extraocular movements intact.     Pupils: Pupils are equal, round, and reactive to light.  Cardiovascular:     Rate and Rhythm: Normal rate and regular rhythm.     Heart sounds: Normal heart sounds. No murmur heard.   No gallop.  Pulmonary:     Effort: Pulmonary effort is normal. No respiratory distress.     Breath sounds: Normal breath sounds. No wheezing or rales.  Skin:    General: Skin is warm and dry.  Neurological:     Mental Status: She is alert and oriented to person, place, and time.  Psychiatric:        Behavior: Behavior normal.        Judgment: Judgment normal.    BP 122/88 (BP Location: Left Arm, Patient Position: Sitting, Cuff Size: Small)   Pulse 66   Temp 98.2 F (36.8 C) (Oral)   Resp 16   Wt 184 lb (83.5 kg)   SpO2 100%   BMI 27.98 kg/m  Wt Readings from Last 3 Encounters:  01/12/21 184 lb (83.5 kg)  11/17/20 179 lb 12.8 oz (81.6 kg)  10/11/20 183 lb 6.4 oz (83.2 kg)    Diabetic Foot Exam - Simple   No data filed    Lab Results  Component Value Date   WBC 4.5 09/20/2020   HGB 14.7 09/20/2020   HCT 43.8 09/20/2020   PLT 181.0 09/20/2020   GLUCOSE 91 11/17/2020   CHOL 201 (H) 11/17/2020   TRIG 53.0 11/17/2020   HDL 80.10 11/17/2020   LDLDIRECT 120.9 08/02/2011   LDLCALC 110 (H) 11/17/2020   ALT 19 11/17/2020   AST 22 11/17/2020   NA 140 11/17/2020   K 3.6 11/17/2020   CL 102 11/17/2020   CREATININE 0.85 11/17/2020   BUN 10 11/17/2020   CO2 30 11/17/2020   TSH 2.98 09/20/2020   HGBA1C 6.2 09/20/2020   MICROALBUR <0.7 10/11/2020    Lab Results  Component Value Date   TSH  2.98 09/20/2020   Lab Results  Component Value Date   WBC 4.5 09/20/2020   HGB 14.7 09/20/2020   HCT 43.8 09/20/2020   MCV 93.9 09/20/2020   PLT 181.0 09/20/2020   Lab Results  Component Value Date   NA 140 11/17/2020   K 3.6 11/17/2020   CO2 30 11/17/2020   GLUCOSE 91 11/17/2020   BUN 10 11/17/2020   CREATININE 0.85 11/17/2020   BILITOT 0.6 11/17/2020   ALKPHOS 73 11/17/2020   AST 22 11/17/2020   ALT 19 11/17/2020   PROT 7.2 11/17/2020   ALBUMIN 4.4 11/17/2020   CALCIUM 9.6 11/17/2020   GFR 74.09 11/17/2020   Lab Results  Component Value Date   CHOL 201 (H) 11/17/2020   Lab Results  Component Value Date   HDL 80.10 11/17/2020   Lab Results  Component Value Date   LDLCALC 110 (H) 11/17/2020   Lab Results  Component Value Date   TRIG 53.0 11/17/2020   Lab Results  Component Value Date   CHOLHDL 3 11/17/2020   Lab Results  Component Value Date   HGBA1C 6.2 09/20/2020       Assessment & Plan:   Problem List Items Addressed This Visit       Unprioritized   Urinary frequency - Primary   Relevant Orders   POCT Urinalysis Dipstick (Automated) (Completed)   Hyperlipidemia associated with type 2 diabetes mellitus (HCC)   Relevant Medications   Semaglutide (RYBELSUS) 7 MG TABS   Other Relevant Orders   Lipid panel   Comprehensive metabolic panel   Primary hypertension   Relevant Orders   Comprehensive metabolic panel   Other Visit Diagnoses     Elevated CK       Relevant Orders   CK (Creatine Kinase)   Type 2 diabetes mellitus with hyperglycemia, without long-term current use of insulin (HCC)       Relevant Medications   Semaglutide (RYBELSUS) 7 MG TABS   Need for influenza vaccination       Relevant Orders   Flu Vaccine QUAD 6mo+IM (Fluarix, Fluzone & Alfiuria Quad PF) (Completed)   Needs flu shot             Meds ordered this encounter  Medications   Semaglutide (RYBELSUS) 7 MG TABS    Sig: Take 1 tablet by mouth daily.     Dispense:  90 tablet    Refill:  1    I, [name], DO, personally preformed the services described in this documentation.  All medical record entries made by the scribe were at my direction and in my presence.  I have reviewed the chart and discharge instructions (if applicable) and agree that the record reflects my personal performance and is accurate and complete. 01/12/2021.   I,Savera Zaman,acting as a Education administrator for [employer], DO.,have documented all relevant documentation on the behalf of [name], DO,as directed by  [name], DO while in the presence of [name], DO.    [name], DO

## 2021-01-12 NOTE — Assessment & Plan Note (Signed)
Well controlled, no changes to meds. Encouraged heart healthy diet such as the DASH diet and exercise as tolerated.  °

## 2021-01-12 NOTE — Patient Instructions (Signed)
Carbohydrate Counting for Diabetes Mellitus, Adult Carbohydrate counting is a method of keeping track of how many carbohydrates you eat. Eating carbohydrates naturally increases the amount of sugar (glucose) in the blood. Counting how many carbohydrates you eat improves your blood glucose control, which helps you manage your diabetes. It is important to know how many carbohydrates you can safely have in each meal. This is different for every person. A dietitian can help you make a meal plan and calculate how many carbohydrates you should have at each meal and snack. What foods contain carbohydrates? Carbohydrates are found in the following foods: Grains, such as breads and cereals. Dried beans and soy products. Starchy vegetables, such as potatoes, peas, and corn. Fruit and fruit juices. Milk and yogurt. Sweets and snack foods, such as cake, cookies, candy, chips, and soft drinks. How do I count carbohydrates in foods? There are two ways to count carbohydrates in food. You can read food labels or learn standard serving sizes of foods. You can use either of the methods or a combination of both. Using the Nutrition Facts label The Nutrition Facts list is included on the labels of almost all packaged foods and beverages in the [place] It includes: The serving size. Information about nutrients in each serving, including the grams (g) of carbohydrate per serving. To use the Nutrition Facts: Decide how many servings you will have. Multiply the number of servings by the number of carbohydrates per serving. The resulting number is the total amount of carbohydrates that you will be having. Learning the standard serving sizes of foods When you eat carbohydrate foods that are not packaged or do not include Nutrition Facts on the label, you need to measure the servings in order to count the amount of carbohydrates. Measure the foods that you will eat with a food scale or measuring cup, if needed. Decide how  many standard-size servings you will eat. Multiply the number of servings by 15. For foods that contain carbohydrates, one serving equals 15 g of carbohydrates. For example, if you eat 2 cups or 10 oz (300 g) of strawberries, you will have eaten 2 servings and 30 g of carbohydrates (2 servings x 15 g = 30 g). For foods that have more than one food mixed, such as soups and casseroles, you must count the carbohydrates in each food that is included. The following list contains standard serving sizes of common carbohydrate-rich foods. Each of these servings has about 15 g of carbohydrates: 1 slice of bread. 1 six-inch (15 cm) tortilla. ? cup or 2 oz (53 g) cooked rice or pasta.  cup or 3 oz (85 g) cooked or canned, drained and rinsed beans or lentils.  cup or 3 oz (85 g) starchy vegetable, such as peas, corn, or squash.  cup or 4 oz (120 g) hot cereal.  cup or 3 oz (85 g) boiled or mashed potatoes, or  or 3 oz (85 g) of a large baked potato.  cup or 4 fl oz (118 mL) fruit juice. 1 cup or 8 fl oz (237 mL) milk. 1 small or 4 oz (106 g) apple.  or 2 oz (63 g) of a medium banana. 1 cup or 5 oz (150 g) strawberries. 3 cups or 1 oz (24 g) popped popcorn. What is an example of carbohydrate counting? To calculate the number of carbohydrates in this sample meal, follow the steps shown below. Sample meal 3 oz (85 g) chicken breast. ? cup or 4 oz (106 g) brown   rice.  cup or 3 oz (85 g) corn. 1 cup or 8 fl oz (237 mL) milk. 1 cup or 5 oz (150 g) strawberries with sugar-free whipped topping. Carbohydrate calculation Identify the foods that contain carbohydrates: Rice. Corn. Milk. Strawberries. Calculate how many servings you have of each food: 2 servings rice. 1 serving corn. 1 serving milk. 1 serving strawberries. Multiply each number of servings by 15 g: 2 servings rice x 15 g = 30 g. 1 serving corn x 15 g = 15 g. 1 serving milk x 15 g = 15 g. 1 serving strawberries x 15 g = 15  g. Add together all of the amounts to find the total grams of carbohydrates eaten: 30 g + 15 g + 15 g + 15 g = 75 g of carbohydrates total. What are tips for following this plan? Shopping Develop a meal plan and then make a shopping list. Buy fresh and frozen vegetables, fresh and frozen fruit, dairy, eggs, beans, lentils, and whole grains. Look at food labels. Choose foods that have more fiber and less sugar. Avoid processed foods and foods with added sugars. Meal planning Aim to have the same amount of carbohydrates at each meal and for each snack time. Plan to have regular, balanced meals and snacks. Where to find more information American Diabetes Association: www.diabetes.org Centers for Disease Control and Prevention: www.cdc.gov Summary Carbohydrate counting is a method of keeping track of how many carbohydrates you eat. Eating carbohydrates naturally increases the amount of sugar (glucose) in the blood. Counting how many carbohydrates you eat improves your blood glucose control, which helps you manage your diabetes. A dietitian can help you make a meal plan and calculate how many carbohydrates you should have at each meal and snack. This information is not intended to replace advice given to you by your health care provider. Make sure you discuss any questions you have with your health care provider. Document Revised: 04/09/2019 Document Reviewed: 04/10/2019 Elsevier Patient Education  2021 Elsevier Inc.  

## 2021-01-19 ENCOUNTER — Other Ambulatory Visit: Payer: Self-pay | Admitting: Family Medicine

## 2021-01-19 DIAGNOSIS — R748 Abnormal levels of other serum enzymes: Secondary | ICD-10-CM

## 2021-04-10 LAB — HM MAMMOGRAPHY

## 2021-04-14 ENCOUNTER — Other Ambulatory Visit (INDEPENDENT_AMBULATORY_CARE_PROVIDER_SITE_OTHER): Payer: BC Managed Care – PPO

## 2021-04-14 DIAGNOSIS — R748 Abnormal levels of other serum enzymes: Secondary | ICD-10-CM | POA: Diagnosis not present

## 2021-04-14 LAB — CK: Total CK: 253 U/L — ABNORMAL HIGH (ref 7–177)

## 2021-04-26 ENCOUNTER — Other Ambulatory Visit: Payer: Self-pay | Admitting: Family Medicine

## 2021-04-28 NOTE — Addendum Note (Signed)
Addended by: Kittie Plater, Quanika Solem HUA on: 04/28/2021 07:43 AM   Modules accepted: Orders

## 2021-04-29 ENCOUNTER — Other Ambulatory Visit: Payer: Self-pay | Admitting: Family Medicine

## 2021-04-29 DIAGNOSIS — I10 Essential (primary) hypertension: Secondary | ICD-10-CM

## 2021-05-27 ENCOUNTER — Other Ambulatory Visit: Payer: Self-pay | Admitting: Family Medicine

## 2021-05-27 DIAGNOSIS — I10 Essential (primary) hypertension: Secondary | ICD-10-CM

## 2021-06-07 ENCOUNTER — Other Ambulatory Visit: Payer: Self-pay

## 2021-06-07 ENCOUNTER — Ambulatory Visit: Payer: BC Managed Care – PPO | Admitting: Internal Medicine

## 2021-06-07 ENCOUNTER — Encounter: Payer: Self-pay | Admitting: Internal Medicine

## 2021-06-07 ENCOUNTER — Ambulatory Visit: Payer: Self-pay

## 2021-06-07 VITALS — BP 115/72 | HR 71 | Resp 16 | Ht 67.0 in | Wt 181.0 lb

## 2021-06-07 DIAGNOSIS — E1165 Type 2 diabetes mellitus with hyperglycemia: Secondary | ICD-10-CM | POA: Diagnosis not present

## 2021-06-07 DIAGNOSIS — M542 Cervicalgia: Secondary | ICD-10-CM

## 2021-06-07 DIAGNOSIS — R200 Anesthesia of skin: Secondary | ICD-10-CM | POA: Diagnosis not present

## 2021-06-07 DIAGNOSIS — R748 Abnormal levels of other serum enzymes: Secondary | ICD-10-CM

## 2021-06-07 DIAGNOSIS — I83813 Varicose veins of bilateral lower extremities with pain: Secondary | ICD-10-CM

## 2021-06-07 HISTORY — DX: Cervicalgia: M54.2

## 2021-06-07 HISTORY — DX: Abnormal levels of other serum enzymes: R74.8

## 2021-06-07 NOTE — Progress Notes (Signed)
Office Visit Note  Patient: Angela Garner             Date of Birth: 1960-09-24           MRN: [id number]             PCP: [name], DO Referring: [name], * Visit Date: 06/07/2021 Occupation: Glass blower/designer  Subjective:  New Patient (Initial Visit) (Abnormal labs)   History of Present Illness: Angela Garner is a 61 y.o. female here for persistent elevated CK levels. She has a history of hypertension, type 2 diabetes, chronic venous insufficiency, and chronic back and knee pain. Elevated CK was found on lab testing for persistent fatigue and exercise intolerance June 2022. At that time she was having some low back muscular pain treated with methocarbamol and knee pain. She stopped taking crestor 5 mg daily due to persistent CK elevation and joint and muscle pain and knee pain did improve after stopping this. However her labs did not become normal despite staying off the crestor. More recently she has an increase in pain especially during the past month worst in the neck and upper back. There is some radiation to shoulders and arms. No numbness in the arms but feels subjectively weaker especially while performing work tasks overhead on requiring ladder climbing.  Labs reviewed CK 03/2021 253 12/2020 230 10/2020 263 09/2020 370 08/2020 224  Activities of Daily Living:  Patient reports morning stiffness for 0  none .   Patient Denies nocturnal pain.  Difficulty dressing/grooming: Denies Difficulty climbing stairs: Reports Difficulty getting out of chair: Reports Difficulty using hands for taps, buttons, cutlery, and/or writing: Denies  Review of Systems  Constitutional:  Positive for fatigue.  HENT:  Positive for mouth dryness.   Eyes:  Negative for dryness.  Respiratory:  Positive for cough and shortness of breath.   Cardiovascular:  Negative for swelling in legs/feet.  Gastrointestinal:  Negative for constipation.  Endocrine: Positive for cold intolerance  and increased urination.  Genitourinary:  Negative for difficulty urinating.  Musculoskeletal:  Positive for joint pain, joint pain, joint swelling and muscle weakness.  Skin:  Negative for rash.  Allergic/Immunologic: Negative for susceptible to infections.  Neurological:  Positive for weakness.  Hematological:  Negative for bruising/bleeding tendency.  Psychiatric/Behavioral:  Positive for sleep disturbance.    PMFS History:  Patient Active Problem List   Diagnosis Date Noted   Elevated CK 06/07/2021   Neck pain 06/07/2021   Controlled type 2 diabetes mellitus with chronic kidney disease, without long-term current use of insulin ([place]) 09/20/2020   Fatigue 09/20/2020   Palpitations 09/20/2020   Arm numbness 03/22/2020   Primary hypertension 03/22/2020   Vitamin D deficiency 03/22/2020   Hyperlipidemia associated with type 2 diabetes mellitus ([place]) 10/19/2019   Bilateral lower extremity edema 10/19/2019   Uncontrolled type 2 diabetes mellitus with hyperglycemia ([place]) 01/30/2019   Hyperlipidemia    Chronic venous insufficiency 01/13/2019   Varicose 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 11/11/2018   Chronic pain of right knee 11/11/2018   Urinary incontinence 11/11/2018   Greater trochanteric bursitis of left hip 05/09/2017   Acute left-sided low back pain without sciatica 05/09/2017   Abdominal pain 05/07/2017   Acute left-sided low back pain with left-sided sciatica 01/14/2015   CTS (carpal tunnel syndrome) 03/17/2013   Hyperglycemia 07/17/2012   Urinary frequency 07/17/2012   Lymphocytosis 11/06/2011   MURMUR 11/20/2007   Essential hypertension 11/05/2007   SINUSITIS-  ACUTE-NOS 04/02/2007   GASTROENTERITIS 02/28/2007   GAS/BLOATING 02/28/2007   ABDOMINAL PAIN, EPIGASTRIC 02/28/2007   KNEE PAIN 01/30/2007   HYPERTENSION, BENIGN ESSENTIAL 10/31/2006   BACK PAIN 10/31/2006   ALLERGIC RHINITIS 05/15/2006    Past Medical History:  Diagnosis Date   Allergy    Diabetes  mellitus without complication ([place])    no meds, only check bs   Heart murmur    Hyperlipidemia    Hypertension     Family History  Problem Relation Age of Onset   Dementia Mother    Hypertension Father    Diabetes Father    Heart attack Father    Diabetes Sister        she had stomach pain-- cause unknown   Clotting disorder Brother    Colon cancer Neg Hx    Esophageal cancer Neg Hx    Stomach cancer Neg Hx    Rectal cancer Neg Hx    Past Surgical History:  Procedure Laterality Date   UTERINE FIBROID SURGERY     Social History   Social History Narrative   Not on file   Immunization History  Administered Date(s) Administered   Influenza, Seasonal, Injecte, Preservative Fre 05/19/2012   Influenza,inj,Quad PF,6+ Mos 01/07/2013, 03/09/2014, 01/14/2015, 01/10/2016, 02/21/2017, 04/08/2018, 01/19/2019, 03/22/2020, 01/12/2021   PFIZER(Purple Top)SARS-COV-2 Vaccination 05/05/2019, 05/26/2019, 11/15/2020   Pneumococcal Polysaccharide-23 01/07/2013, 04/20/2019     Objective: Vital Signs: BP 115/72 (BP Location: Right Arm, Patient Position: Sitting, Cuff Size: Normal)    Pulse 71    Resp 16    Ht 5\' 7"  (1.702 m)    Wt 181 lb (82.1 kg)    BMI 28.35 kg/m    Physical Exam Eyes:     Conjunctiva/sclera: Conjunctivae normal.  Cardiovascular:     Rate and Rhythm: Normal rate and regular rhythm.  Pulmonary:     Effort: Pulmonary effort is normal.     Breath sounds: Normal breath sounds.  Musculoskeletal:     Right lower leg: No edema.     Left lower leg: No edema.  Skin:    General: Skin is warm and dry.     Findings: No rash.  Neurological:     General: No focal deficit present.     Mental Status: She is alert.     Deep Tendon Reflexes: Reflexes normal.  Psychiatric:        Mood and Affect: Mood normal.     Musculoskeletal Exam:  Neck full ROM tenderness to pressure in paraspinal muscles and base of scalenes Shoulders full ROM some pain more to trapezius groups with  resisted shrug and abduction Elbows full ROM no tenderness or swelling Wrists full ROM no tenderness or swelling Fingers full ROM no tenderness or swelling Knees full ROM no tenderness or swelling Ankles full ROM no tenderness or swelling MTPs full ROM no tenderness or swelling   Investigation: No additional findings.  Imaging: XR Cervical Spine 2 or 3 views  Result Date: 06/17/2021 Xray cervical spine 2 views There is straightening of the normal cervical lordosis. Mild degenerative changes with anterior osteophytes present worst at C5-C6 level. Small anterior calcifications no ankylosis. There is no obvious significant spondylolisthesis or acute bony abnormality. Impression Mild degenerative changes worst at C5-C6   Recent Labs: Lab Results  Component Value Date   WBC 4.5 09/20/2020   HGB 14.7 09/20/2020   PLT 181.0 09/20/2020   NA 142 01/12/2021   K 3.6 01/12/2021   CL 104 01/12/2021   CO2  31 01/12/2021   GLUCOSE 96 01/12/2021   BUN 19 01/12/2021   CREATININE 0.88 01/12/2021   BILITOT 0.7 01/12/2021   ALKPHOS 71 01/12/2021   AST 21 01/12/2021   ALT 18 01/12/2021   PROT 7.0 01/12/2021   ALBUMIN 4.3 01/12/2021   CALCIUM 9.3 01/12/2021   GFRAA 98 11/05/2007    Speciality Comments: No specialty comments available.  Procedures:  No procedures performed Allergies: Metformin and related, Pravachol [pravastatin sodium], and Simvastatin   Assessment / Plan:     Visit Diagnoses: Elevated CK - Plan: Sedimentation rate, CK, Aldolase  Symptoms mostly myalgias no weakness. CK elevation is persistent but very mild. Repeating CK also checking aldolase and sed rate for evidence of active inflammation. My suspicion is more for this being a benign findings and current myalgias unrelated. If worse would consider EMG for additional workup.  Arm numbness  Symptoms are very intermittent and distribution not clear, I think spinal cause less likely and xray checked is mostly  unremarkable.  Uncontrolled type 2 diabetes mellitus with hyperglycemia (HCC) Varicose 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  No significant dermatitis, no ulcers, minimal tenderness to palpation at this time probably not contributory.  Neck pain - Plan: XR Cervical Spine 2 or 3 views  Neck pain mostly just within past few months no traumatic event no obvious radicular symptoms. Xray checked showing mild DJD but nothing to obviously suggest nerve impingement problems. Muscle spasticity more significant in current symptoms. Probably overuse related problem with reported change in work situation.  Orders: Orders Placed This Encounter  Procedures   XR Cervical Spine 2 or 3 views   Sedimentation rate   CK   Aldolase   No orders of the defined types were placed in this encounter.   Follow-Up Instructions: No follow-ups on file.   Collier Salina, MD  Note - This record has been created using [employer].  Chart creation errors have been sought, but may not always  have been located. Such creation errors do not reflect on  the standard of medical care.

## 2021-06-08 LAB — SEDIMENTATION RATE: Sed Rate: 2 mm/h (ref 0–30)

## 2021-06-08 LAB — CK: Total CK: 261 U/L — ABNORMAL HIGH (ref 29–143)

## 2021-06-08 LAB — ALDOLASE: Aldolase: 4.5 U/L (ref <=8.1)

## 2021-06-12 ENCOUNTER — Telehealth: Payer: Self-pay | Admitting: Rheumatology

## 2021-06-12 ENCOUNTER — Telehealth: Payer: Self-pay | Admitting: Internal Medicine

## 2021-06-12 NOTE — Progress Notes (Signed)
Lab results look okay. Her CK remains mildly high at 261 but within the range of previous values.  The aldolase and sed rate are normal so muscle inflammation seems very unlikely. I don't think the mildly high CK needs any additional workup or treatments at this time.  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 This is not very different compared to previous xray from a few years ago so I'm not sure it is causing the problems versus overuse and muscle strain.

## 2021-06-12 NOTE — Telephone Encounter (Signed)
ERROR

## 2021-06-12 NOTE — Telephone Encounter (Signed)
Patient called the office requesting a return call regarding labs. [phone]

## 2021-06-13 ENCOUNTER — Ambulatory Visit: Payer: BC Managed Care – PPO | Admitting: Family Medicine

## 2021-06-19 ENCOUNTER — Encounter: Payer: Self-pay | Admitting: Family Medicine

## 2021-06-19 ENCOUNTER — Ambulatory Visit: Payer: BC Managed Care – PPO | Admitting: Family Medicine

## 2021-06-19 VITALS — BP 123/83 | HR 72

## 2021-06-19 DIAGNOSIS — E1169 Type 2 diabetes mellitus with other specified complication: Secondary | ICD-10-CM

## 2021-06-19 DIAGNOSIS — E785 Hyperlipidemia, unspecified: Secondary | ICD-10-CM | POA: Diagnosis not present

## 2021-06-19 DIAGNOSIS — I10 Essential (primary) hypertension: Secondary | ICD-10-CM

## 2021-06-19 DIAGNOSIS — E1122 Type 2 diabetes mellitus with diabetic chronic kidney disease: Secondary | ICD-10-CM | POA: Diagnosis not present

## 2021-06-19 DIAGNOSIS — E1165 Type 2 diabetes mellitus with hyperglycemia: Secondary | ICD-10-CM | POA: Diagnosis not present

## 2021-06-19 LAB — HEMOGLOBIN A1C: Hgb A1c MFr Bld: 6.2 % (ref 4.6–6.5)

## 2021-06-19 LAB — COMPREHENSIVE METABOLIC PANEL
ALT: 18 U/L (ref 0–35)
AST: 20 U/L (ref 0–37)
Albumin: 4.5 g/dL (ref 3.5–5.2)
Alkaline Phosphatase: 69 U/L (ref 39–117)
BUN: 12 mg/dL (ref 6–23)
CO2: 32 mEq/L (ref 19–32)
Calcium: 9.8 mg/dL (ref 8.4–10.5)
Chloride: 103 mEq/L (ref 96–112)
Creatinine, Ser: 0.77 mg/dL (ref 0.40–1.20)
GFR: 83.08 mL/min (ref >60.00)
Glucose, Bld: 115 mg/dL — ABNORMAL HIGH (ref 70–99)
Potassium: 3.4 mEq/L — ABNORMAL LOW (ref 3.5–5.1)
Sodium: 141 mEq/L (ref 135–145)
Total Bilirubin: 0.7 mg/dL (ref 0.2–1.2)
Total Protein: 7.3 g/dL (ref 6.0–8.3)

## 2021-06-19 LAB — LIPID PANEL
Cholesterol: 201 mg/dL — ABNORMAL HIGH (ref 0–200)
HDL: 76 mg/dL (ref >39.00)
LDL Cholesterol: 111 mg/dL — ABNORMAL HIGH (ref 0–99)
NonHDL: 124.78
Total CHOL/HDL Ratio: 3
Triglycerides: 68 mg/dL (ref 0.0–149.0)
VLDL: 13.6 mg/dL (ref 0.0–40.0)

## 2021-06-19 MED ORDER — OLMESARTAN MEDOXOMIL 20 MG PO TABS: 20.0000 mg | ORAL_TABLET | Freq: Every day | ORAL | 1 refills | Status: DC

## 2021-06-19 NOTE — Assessment & Plan Note (Signed)
Well controlled, no changes to meds. Encouraged heart healthy diet such as the DASH diet and exercise as tolerated.  °

## 2021-06-19 NOTE — Assessment & Plan Note (Signed)
hgba1c to be checked, minimize simple carbs. Increase exercise as tolerated. Continue current meds  

## 2021-06-19 NOTE — Progress Notes (Addendum)
Subjective:   By signing my name below, I, Angela Garner, attest that this documentation has been prepared under the direction and in the presence of Angela Held, DO. 06/19/2021  Patient ID: Angela Garner, female    DOB: [birth year], 61 y.o.   MRN: [id number]  Chief Complaint  Patient presents with   Hypertension   Follow-up    HPI Patient is in today for an office visit.  She was complaining of numbness and pain in her shoulders. She completed an X-ray and found no new issues. She is not regularly exercising and thinks it may be causing some pain. She is interested in receiving a massage to manage her pain. She also reports having pain while completing her job.  Her blood pressure is doing well during this visit. She is requesting a refill on 20 mg olmesartan daily PO. She has mild swelling in both legs.   BP Readings from Last 3 Encounters:  06/19/21 123/83  06/07/21 115/72  01/12/21 122/88   Pulse Readings from Last 3 Encounters:  06/19/21 72  06/07/21 71  01/12/21 66   She typically takes 7 mg rybelsus daily PO but reports no taking it the past couple of days due to traveling. She measures her blood sugar regularly at home.  Lab Results  Component Value Date   HGBA1C 6.2 06/19/2021   She seen a rheumatologist and found no new issues during her last visit.    Past Medical History:  Diagnosis Date   Allergy    Diabetes mellitus without complication (HCC)    no meds, only check bs   Heart murmur    Hyperlipidemia    Hypertension     Past Surgical History:  Procedure Laterality Date   UTERINE FIBROID SURGERY      Family History  Problem Relation Age of Onset   Dementia Mother    Hypertension Father    Diabetes Father    Heart attack Father    Diabetes Sister        she had stomach pain-- cause unknown   Clotting disorder Brother    Colon cancer Neg Hx    Esophageal cancer Neg Hx    Stomach cancer Neg Hx    Rectal cancer Neg Hx     Social History    Socioeconomic History   Marital status: Divorced    Spouse name: Not on file   Number of children: Not on file   Years of education: Not on file   Highest education level: Not on file  Occupational History   Occupation: quality conrol     Comment: [employer]  Tobacco Use   Smoking status: Never   Smokeless tobacco: Never  Vaping Use   Vaping Use: Never used  Substance and Sexual Activity   Alcohol use: No   Drug use: No   Sexual activity: Yes  Other Topics Concern   Not on file  Social History Narrative   Not on file   Social Determinants of Health   Financial Resource Strain: Not on file  Food Insecurity: Not on file  Transportation Needs: Not on file  Physical Activity: Not on file  Stress: Not on file  Social Connections: Not on file  Intimate Partner Violence: Not on file    Outpatient Medications Prior to Visit  Medication Sig Dispense Refill   amLODipine (NORVASC) 5 MG tablet Take 1 tablet by mouth once daily 90 tablet 0   Estradiol (YUVAFEM VA) Place vaginally.  loratadine (CLARITIN) 10 MG tablet Take 1 tablet (10 mg total) by mouth daily. 30 tablet 11   multivitamin-iron-minerals-folic acid (CENTRUM) chewable tablet Chew 1 tablet by mouth daily.     oxybutynin (DITROPAN-XL) 10 MG 24 hr tablet Take 10 mg by mouth at bedtime.     Semaglutide (RYBELSUS) 7 MG TABS Take 1 tablet by mouth daily. 90 tablet 1   olmesartan (BENICAR) 20 MG tablet Take 1 tablet (20 mg total) by mouth daily. Needs office visit before any further refills. 30 tablet 0   NONFORMULARY OR COMPOUNDED ITEM Compression stocking -- thigh high  20-30 mm/hg #1  Dx varicose veint (Patient not taking: Reported on 06/07/2021) 1 each 0   diclofenac (VOLTAREN) 75 MG EC tablet Take 1 tablet (75 mg total) by mouth 2 (two) times daily. (Patient not taking: Reported on 06/07/2021) 60 tablet 1   hydrochlorothiazide (HYDRODIURIL) 25 MG tablet 1/2 po qd (Patient not taking: Reported on 06/07/2021) 45  tablet 3   methocarbamol (ROBAXIN) 500 MG tablet Take 1 tablet (500 mg total) by mouth 4 (four) times daily. (Patient not taking: Reported on 06/07/2021) 45 tablet 1   No facility-administered medications prior to visit.    Allergies  Allergen Reactions   Metformin And Related    Pravachol [Pravastatin Sodium] Other (See Comments)    Muscle aches   Simvastatin     "leg pains"    Review of Systems  Constitutional:  Negative for fever and malaise/fatigue.  HENT:  Negative for congestion.   Eyes:  Negative for blurred vision.  Respiratory:  Negative for cough and shortness of breath.   Cardiovascular:  Positive for leg swelling. Negative for chest pain and palpitations.  Gastrointestinal:  Negative for abdominal pain, blood in stool, nausea and vomiting.  Genitourinary:  Negative for dysuria and frequency.  Musculoskeletal:  Positive for neck pain. Negative for back pain and falls.       (+)numbness in both shoulders  Skin:  Negative for rash.  Neurological:  Negative for dizziness, loss of consciousness and headaches.  Endo/Heme/Allergies:  Negative for environmental allergies.  Psychiatric/Behavioral:  Negative for depression. The patient is not nervous/anxious.       Objective:    Physical Exam Vitals and nursing note reviewed.  Constitutional:      General: She is not in acute distress.    Appearance: Normal appearance. She is not ill-appearing.  HENT:     Head: Normocephalic and atraumatic.     Right Ear: External ear normal.     Left Ear: External ear normal.  Eyes:     Extraocular Movements: Extraocular movements intact.     Pupils: Pupils are equal, round, and reactive to light.  Cardiovascular:     Rate and Rhythm: Normal rate and regular rhythm.     Heart sounds: Normal heart sounds. No murmur heard.   No gallop.  Pulmonary:     Effort: Pulmonary effort is normal. No respiratory distress.     Breath sounds: Normal breath sounds. No wheezing or rales.   Musculoskeletal:     Right lower leg: 1+ Pitting Edema present.     Left lower leg: 1+ Pitting Edema present.  Feet:     Comments: Diabetic Foot Exam - Simple   Simple Foot Form Diabetic Foot exam was performed with the following findings: Yes  06/19/2021 11:38 AM  Visual Inspection No deformities, no ulcerations, no other skin breakdown bilaterally: Yes Sensation Testing Intact to touch and monofilament testing bilaterally: Yes  Pulse Check Posterior Tibialis and Dorsalis pulse intact bilaterally: Yes Comments  Skin:    General: Skin is warm and dry.  Neurological:     Mental Status: She is alert and oriented to person, place, and time.  Psychiatric:        Mood and Affect: Mood normal.        Behavior: Behavior normal.        Thought Content: Thought content normal.        Judgment: Judgment normal.    BP 123/83 (BP Location: Right Arm, Cuff Size: Normal)    Pulse 72  Wt Readings from Last 3 Encounters:  06/07/21 181 lb (82.1 kg)  01/12/21 184 lb (83.5 kg)  11/17/20 179 lb 12.8 oz (81.6 kg)    Diabetic Foot Exam - Simple   Simple Foot Form Diabetic Foot exam was performed with the following findings: Yes 06/19/2021 11:38 AM  Visual Inspection No deformities, no ulcerations, no other skin breakdown bilaterally: Yes Sensation Testing Intact to touch and monofilament testing bilaterally: Yes Pulse Check Posterior Tibialis and Dorsalis pulse intact bilaterally: Yes Comments    Lab Results  Component Value Date   WBC 4.5 09/20/2020   HGB 14.7 09/20/2020   HCT 43.8 09/20/2020   PLT 181.0 09/20/2020   GLUCOSE 115 (H) 06/19/2021   CHOL 201 (H) 06/19/2021   TRIG 68.0 06/19/2021   HDL 76.00 06/19/2021   LDLDIRECT 120.9 08/02/2011   LDLCALC 111 (H) 06/19/2021   ALT 18 06/19/2021   AST 20 06/19/2021   NA 141 06/19/2021   K 3.4 (L) 06/19/2021   CL 103 06/19/2021   CREATININE 0.77 06/19/2021   BUN 12 06/19/2021   CO2 32 06/19/2021   TSH 2.98 09/20/2020   HGBA1C  6.2 06/19/2021   MICROALBUR 3.1 (H) 06/19/2021    Lab Results  Component Value Date   TSH 2.98 09/20/2020   Lab Results  Component Value Date   WBC 4.5 09/20/2020   HGB 14.7 09/20/2020   HCT 43.8 09/20/2020   MCV 93.9 09/20/2020   PLT 181.0 09/20/2020   Lab Results  Component Value Date   NA 141 06/19/2021   K 3.4 (L) 06/19/2021   CO2 32 06/19/2021   GLUCOSE 115 (H) 06/19/2021   BUN 12 06/19/2021   CREATININE 0.77 06/19/2021   BILITOT 0.7 06/19/2021   ALKPHOS 69 06/19/2021   AST 20 06/19/2021   ALT 18 06/19/2021   PROT 7.3 06/19/2021   ALBUMIN 4.5 06/19/2021   CALCIUM 9.8 06/19/2021   GFR 83.08 06/19/2021   Lab Results  Component Value Date   CHOL 201 (H) 06/19/2021   Lab Results  Component Value Date   HDL 76.00 06/19/2021   Lab Results  Component Value Date   LDLCALC 111 (H) 06/19/2021   Lab Results  Component Value Date   TRIG 68.0 06/19/2021   Lab Results  Component Value Date   CHOLHDL 3 06/19/2021   Lab Results  Component Value Date   HGBA1C 6.2 06/19/2021       Assessment & Plan:   Problem List Items Addressed This Visit       Unprioritized   Primary hypertension   Relevant Medications   olmesartan (BENICAR) 20 MG tablet   Other Relevant Orders   Comprehensive metabolic panel (Completed)   Hemoglobin A1c (Completed)   Lipid panel (Completed)   Microalbumin / creatinine urine ratio (Completed)   Controlled type 2 diabetes mellitus with chronic kidney disease, without long-term current use  of insulin ([place])    hgba1c to be checked, minimize simple carbs. Increase exercise as tolerated. Continue current meds       Relevant Medications   olmesartan (BENICAR) 20 MG tablet   Essential hypertension    Well controlled, no changes to meds. Encouraged heart healthy diet such as the DASH diet and exercise as tolerated.       Relevant Medications   olmesartan (BENICAR) 20 MG tablet   Hyperlipidemia associated with type 2 diabetes  mellitus (HCC)    Encourage heart healthy diet such as MIND or DASH diet, increase exercise, avoid trans fats, simple carbohydrates and processed foods, consider a krill or fish or flaxseed oil cap daily.       Relevant Medications   olmesartan (BENICAR) 20 MG tablet   Other Relevant Orders   Comprehensive metabolic panel (Completed)   Hemoglobin A1c (Completed)   Lipid panel (Completed)   Microalbumin / creatinine urine ratio (Completed)   Hyperlipidemia    Encourage heart healthy diet such as MIND or DASH diet, increase exercise, avoid trans fats, simple carbohydrates and processed foods, consider a krill or fish or flaxseed oil cap daily.       Relevant Medications   olmesartan (BENICAR) 20 MG tablet   Uncontrolled type 2 diabetes mellitus with hyperglycemia ([place])    hgba1c to be checked  minimize simple carbs. Increase exercise as tolerated. Continue current meds      Relevant Medications   olmesartan (BENICAR) 20 MG tablet   Other Visit Diagnoses     Type 2 diabetes mellitus with hyperglycemia, without long-term current use of insulin (HCC)    -  Primary   Relevant Medications   olmesartan (BENICAR) 20 MG tablet   Other Relevant Orders   Comprehensive metabolic panel (Completed)   Hemoglobin A1c (Completed)   Lipid panel (Completed)   Microalbumin / creatinine urine ratio (Completed)         Meds ordered this encounter  Medications   olmesartan (BENICAR) 20 MG tablet    Sig: Take 1 tablet (20 mg total) by mouth daily. Needs office visit before any further refills.    Dispense:  90 tablet    Refill:  1    I, Angela Held, DO, personally preformed the services described in this documentation.  All medical record entries made by the scribe were at my direction and in my presence.  I have reviewed the chart and discharge instructions (if applicable) and agree that the record reflects my personal performance and is accurate and complete.  06/19/2021    I,Angela Garner,acting as a scribe for Angela Held, DO.,have documented all relevant documentation on the behalf of Angela Held, DO,as directed by  Angela Held, DO while in the presence of Angela Held, DO.   Angela Held, DO

## 2021-06-19 NOTE — Patient Instructions (Signed)

## 2021-06-19 NOTE — Assessment & Plan Note (Signed)
Encourage heart healthy diet such as MIND or DASH diet, increase exercise, avoid trans fats, simple carbohydrates and processed foods, consider a krill or fish or flaxseed oil cap daily.  °

## 2021-06-20 LAB — MICROALBUMIN / CREATININE URINE RATIO
Creatinine,U: 375.4 mg/dL
Microalb Creat Ratio: 0.8 mg/g (ref 0.0–30.0)
Microalb, Ur: 3.1 mg/dL — ABNORMAL HIGH (ref 0.0–1.9)

## 2021-06-20 NOTE — Assessment & Plan Note (Signed)
Encourage heart healthy diet such as MIND or DASH diet, increase exercise, avoid trans fats, simple carbohydrates and processed foods, consider a krill or fish or flaxseed oil cap daily.  °

## 2021-06-20 NOTE — Assessment & Plan Note (Signed)
hgba1c to be checked minimize simple carbs. Increase exercise as tolerated. Continue current meds 

## 2021-06-27 ENCOUNTER — Other Ambulatory Visit: Payer: Self-pay

## 2021-06-27 DIAGNOSIS — E1165 Type 2 diabetes mellitus with hyperglycemia: Secondary | ICD-10-CM

## 2021-06-27 MED ORDER — RYBELSUS 7 MG PO TABS: 1.0000 | ORAL_TABLET | Freq: Every day | ORAL | 1 refills | Status: DC

## 2021-06-27 MED ORDER — EZETIMIBE 10 MG PO TABS: 10.0000 mg | ORAL_TABLET | Freq: Every day | ORAL | 2 refills | Status: DC

## 2021-07-04 ENCOUNTER — Telehealth: Payer: Self-pay | Admitting: Family Medicine

## 2021-07-04 NOTE — Telephone Encounter (Signed)
Pt states she does not have insurance at the moment and because of that cholesterol rx was too expensive. She would like to know if Dr.Lowne could find her a cheaper alternative.  ?

## 2021-07-04 NOTE — Telephone Encounter (Signed)
[facility], [address] [place], [place] [address]  ?Phone:  [phone]  Fax:  [phone]  ?

## 2021-07-05 NOTE — Telephone Encounter (Signed)
Pt called for update on meds  ? ? ?Can pt start taking Rosuvastatin until meds are called into pharmacy  ? ?Please advise  ?

## 2021-07-06 MED ORDER — LOVASTATIN 20 MG PO TABS: 20.0000 mg | ORAL_TABLET | Freq: Every day | ORAL | 2 refills | Status: DC

## 2021-07-06 NOTE — Telephone Encounter (Signed)
Zetia d/c. Lovastatin sent to [facility].  ?

## 2021-07-06 NOTE — Telephone Encounter (Signed)
Carollee Herter, Alferd Apa, DO  Sanda Linger, CMA 23 hours ago (10:04 AM)  ? ?There is no alternative to zetia-------  we can try lovastatin 20 mg daily----  I believe this is very inexpensive esp at [facility] or [facility]    ? ?

## 2021-07-24 ENCOUNTER — Telehealth: Payer: Self-pay

## 2021-07-24 ENCOUNTER — Telehealth: Payer: Self-pay | Admitting: Family Medicine

## 2021-07-24 NOTE — Telephone Encounter (Signed)
PA started

## 2021-07-24 NOTE — Telephone Encounter (Signed)
PA initiated via Covermymeds; KEY:  [id number]. Awaiting determination.  ?

## 2021-07-24 NOTE — Telephone Encounter (Signed)
Pharmacy calling for an update on pa for rybelsus.  ?

## 2021-07-25 NOTE — Telephone Encounter (Signed)
Fax failed- will call for fax number tomorrow.  ?

## 2021-07-25 NOTE — Telephone Encounter (Signed)
[facility] is needing the last 2-3 ov notes faxed to [phone].  ?

## 2021-07-25 NOTE — Telephone Encounter (Signed)
Last OV note and labs faxed. ?

## 2021-07-26 NOTE — Telephone Encounter (Signed)
PA approved.  ? ?PA Case: [id number], Status: Approved, Coverage Starts on: 07/24/2021 12:00 AM, Coverage Ends on: 07/25/2022 12:00 AM. Questions? Contact [phone]. ?

## 2021-07-26 NOTE — Telephone Encounter (Signed)
Called [facility]- fax number to send medical records: [phone]. OV notes faxed.  ?

## 2021-08-22 ENCOUNTER — Ambulatory Visit (INDEPENDENT_AMBULATORY_CARE_PROVIDER_SITE_OTHER): Payer: 59 | Admitting: Family Medicine

## 2021-08-22 ENCOUNTER — Ambulatory Visit: Payer: Self-pay | Admitting: Family Medicine

## 2021-08-22 VITALS — BP 110/92 | HR 91 | Temp 100.3°F | Resp 18 | Ht 67.0 in | Wt 189.4 lb

## 2021-08-22 DIAGNOSIS — R051 Acute cough: Secondary | ICD-10-CM

## 2021-08-22 DIAGNOSIS — J0140 Acute pansinusitis, unspecified: Secondary | ICD-10-CM | POA: Diagnosis not present

## 2021-08-22 MED ORDER — PROMETHAZINE-DM 6.25-15 MG/5ML PO SYRP: 5.0000 mL | ORAL_SOLUTION | Freq: Four times a day (QID) | ORAL | 0 refills | Status: DC | PRN

## 2021-08-22 MED ORDER — AMOXICILLIN-POT CLAVULANATE 875-125 MG PO TABS: 1.0000 | ORAL_TABLET | Freq: Two times a day (BID) | ORAL | 0 refills | Status: DC

## 2021-08-22 MED ORDER — FLUTICASONE PROPIONATE 50 MCG/ACT NA SUSP: 2.0000 | Freq: Every day | NASAL | 6 refills | Status: DC

## 2021-08-22 NOTE — Patient Instructions (Signed)

## 2021-08-22 NOTE — Progress Notes (Signed)
? ?Subjective:  ? ?By signing my name below, I, Angela Garner, attest that this documentation has been prepared under the direction and in the presence of [name], DO. 08/22/2021   ? ? Patient ID: Angela Garner, female    DOB: 07-08-1960, 61 y.o.   MRN: [id number] ? ?Chief Complaint  ?Patient presents with  ? Sore Throat  ?  X1 week, pt states she was having sore throat but it has gotten better but states having runny nose and headache.   ? ? ?HPI ?Patient is in today for an office visit. ? ?She developed cough symptoms that started a week ago. She thought it was allergies and was using home remedies. Symptoms include congestion, rhinorrhea, fever and a slight cough. Temperature today is 100.3. Notes that the drainage was yellow at first and is now clear. She has been using off-brand zyrtec and coricidin. ? ?She has not taken a Covid-19 test. ? ?Past Medical History:  ?Diagnosis Date  ? Allergy   ? Diabetes mellitus without complication ([place])   ? no meds, only check bs  ? Heart murmur   ? Hyperlipidemia   ? Hypertension   ? ? ?Past Surgical History:  ?Procedure Laterality Date  ? UTERINE FIBROID SURGERY    ? ? ?Family History  ?Problem Relation Age of Onset  ? Dementia Mother   ? Hypertension Father   ? Diabetes Father   ? Heart attack Father   ? Diabetes Sister   ?     she had stomach pain-- cause unknown  ? Clotting disorder Brother   ? Colon cancer Neg Hx   ? Esophageal cancer Neg Hx   ? Stomach cancer Neg Hx   ? Rectal cancer Neg Hx   ? ? ?Social History  ? ?Socioeconomic History  ? Marital status: Divorced  ?  Spouse name: Not on file  ? Number of children: Not on file  ? Years of education: Not on file  ? Highest education level: Not on file  ?Occupational History  ? Occupation: [employer]   ?  Comment: [employer]  ?Tobacco Use  ? Smoking status: Never  ? Smokeless tobacco: Never  ?Vaping Use  ? Vaping Use: Never used  ?Substance and Sexual Activity  ? Alcohol use: No  ? Drug use: No  ? Sexual  activity: Yes  ?Other Topics Concern  ? Not on file  ?Social History Narrative  ? Not on file  ? ?Social Determinants of Health  ? ?Financial Resource Strain: Not on file  ?Food Insecurity: Not on file  ?Transportation Needs: Not on file  ?Physical Activity: Not on file  ?Stress: Not on file  ?Social Connections: Not on file  ?Intimate Partner Violence: Not on file  ? ? ?Outpatient Medications Prior to Visit  ?Medication Sig Dispense Refill  ? amLODipine (NORVASC) 5 MG tablet Take 1 tablet by mouth once daily 90 tablet 0  ? Estradiol [facility]) Place vaginally.    ? loratadine (CLARITIN) 10 MG tablet Take 1 tablet (10 mg total) by mouth daily. 30 tablet 11  ? lovastatin (MEVACOR) 20 MG tablet Take 1 tablet (20 mg total) by mouth at bedtime. 30 tablet 2  ? multivitamin-iron-minerals-folic acid (CENTRUM) chewable tablet Chew 1 tablet by mouth daily.    ? olmesartan (BENICAR) 20 MG tablet Take 1 tablet (20 mg total) by mouth daily. Needs office visit before any further refills. 90 tablet 1  ? oxybutynin (DITROPAN-XL) 10 MG  24 hr tablet Take 10 mg by mouth at bedtime.    ? Semaglutide (RYBELSUS) 7 MG TABS Take 1 tablet by mouth daily. 90 tablet 1  ? NONFORMULARY OR COMPOUNDED ITEM Compression stocking -- thigh high  20-30 mm/hg #1  Dx varicose veint (Patient not taking: Reported on 06/07/2021) 1 each 0  ? ?No facility-administered medications prior to visit.  ? ? ?Allergies  ?Allergen Reactions  ? Metformin And Related   ? Pravachol [Pravastatin Sodium] Other (See Comments)  ?  Muscle aches  ? Simvastatin   ?  "leg pains"  ? ? ?Review of Systems  ?Constitutional:  Positive for fever.  ?HENT:  Positive for congestion. Negative for ear pain, hearing loss, sinus pain and sore throat.   ?     (+) rhinorrhea   ?Eyes:  Negative for blurred vision and pain.  ?Respiratory:  Positive for cough. Negative for sputum production, shortness of breath and wheezing.   ?Cardiovascular:  Negative for chest pain and palpitations.   ?Gastrointestinal:  Negative for blood in stool, constipation, diarrhea, nausea and vomiting.  ?Genitourinary:  Negative for dysuria, frequency, hematuria and urgency.  ?Musculoskeletal:  Negative for back pain, falls and myalgias.  ?Neurological:  Negative for dizziness, sensory change, loss of consciousness, weakness and headaches.  ?Endo/Heme/Allergies:  Negative for environmental allergies. Does not bruise/bleed easily.  ?Psychiatric/Behavioral:  Negative for depression and suicidal ideas. The patient is not nervous/anxious and does not have insomnia.   ? ?   ?Objective:  ?  ?Physical Exam ?Constitutional:   ?   General: She is not in acute distress. ?   Appearance: Normal appearance. She is not ill-appearing.  ?HENT:  ?   Head: Normocephalic and atraumatic.  ?   Right Ear: External ear normal.  ?   Left Ear: External ear normal.  ?Eyes:  ?   Extraocular Movements: Extraocular movements intact.  ?   Pupils: Pupils are equal, round, and reactive to light.  ?Cardiovascular:  ?   Rate and Rhythm: Normal rate and regular rhythm.  ?   Pulses: Normal pulses.  ?   Heart sounds: Normal heart sounds. No murmur heard. ?  No gallop.  ?Pulmonary:  ?   Effort: Pulmonary effort is normal. No respiratory distress.  ?   Breath sounds: Normal breath sounds. No wheezing, rhonchi or rales.  ?Abdominal:  ?   General: Bowel sounds are normal. There is no distension.  ?   Palpations: Abdomen is soft. There is no mass.  ?   Tenderness: There is no abdominal tenderness. There is no guarding or rebound.  ?   Hernia: No hernia is present.  ?Musculoskeletal:  ?   Cervical back: Normal range of motion and neck supple.  ?Lymphadenopathy:  ?   Cervical: Cervical adenopathy present.  ?Skin: ?   General: Skin is warm and dry.  ?Neurological:  ?   Mental Status: She is alert and oriented to person, place, and time.  ?Psychiatric:     ?   Behavior: Behavior normal.  ? ? ?BP (!) 110/92 (BP Location: Right Arm, Patient Position: Sitting, Cuff  Size: Normal)   Pulse 91   Temp 100.3 ?F (37.9 ?C) (Oral)   Resp 18   Ht '5\' 7"'$  (1.702 m)   Wt 189 lb 6.4 oz (85.9 kg)   SpO2 99%   BMI 29.66 kg/m?  ?Wt Readings from Last 3 Encounters:  ?08/22/21 189 lb 6.4 oz (85.9 kg)  ?06/07/21 181 lb (82.1 kg)  ?  01/12/21 184 lb (83.5 kg)  ? ? ?Diabetic Foot Exam - Simple   ?No data filed ?  ? ?Lab Results  ?Component Value Date  ? WBC 4.5 09/20/2020  ? HGB 14.7 09/20/2020  ? HCT 43.8 09/20/2020  ? PLT 181.0 09/20/2020  ? GLUCOSE 115 (H) 06/19/2021  ? CHOL 201 (H) 06/19/2021  ? TRIG 68.0 06/19/2021  ? HDL 76.00 06/19/2021  ? LDLDIRECT 120.9 08/02/2011  ? LDLCALC 111 (H) 06/19/2021  ? ALT 18 06/19/2021  ? AST 20 06/19/2021  ? NA 141 06/19/2021  ? K 3.4 (L) 06/19/2021  ? CL 103 06/19/2021  ? CREATININE 0.77 06/19/2021  ? BUN 12 06/19/2021  ? CO2 32 06/19/2021  ? TSH 2.98 09/20/2020  ? HGBA1C 6.2 06/19/2021  ? MICROALBUR 3.1 (H) 06/19/2021  ? ? ?Lab Results  ?Component Value Date  ? TSH 2.98 09/20/2020  ? ?Lab Results  ?Component Value Date  ? WBC 4.5 09/20/2020  ? HGB 14.7 09/20/2020  ? HCT 43.8 09/20/2020  ? MCV 93.9 09/20/2020  ? PLT 181.0 09/20/2020  ? ?Lab Results  ?Component Value Date  ? NA 141 06/19/2021  ? K 3.4 (L) 06/19/2021  ? CO2 32 06/19/2021  ? GLUCOSE 115 (H) 06/19/2021  ? BUN 12 06/19/2021  ? CREATININE 0.77 06/19/2021  ? BILITOT 0.7 06/19/2021  ? ALKPHOS 69 06/19/2021  ? AST 20 06/19/2021  ? ALT 18 06/19/2021  ? PROT 7.3 06/19/2021  ? ALBUMIN 4.5 06/19/2021  ? CALCIUM 9.8 06/19/2021  ? GFR 83.08 06/19/2021  ? ?Lab Results  ?Component Value Date  ? CHOL 201 (H) 06/19/2021  ? ?Lab Results  ?Component Value Date  ? HDL 76.00 06/19/2021  ? ?Lab Results  ?Component Value Date  ? LDLCALC 111 (H) 06/19/2021  ? ?Lab Results  ?Component Value Date  ? TRIG 68.0 06/19/2021  ? ?Lab Results  ?Component Value Date  ? CHOLHDL 3 06/19/2021  ? ?Lab Results  ?Component Value Date  ? HGBA1C 6.2 06/19/2021  ? ? ?   ?Assessment & Plan:  ? ?Problem List Items Addressed This Visit    ? ?  ? Unprioritized  ? SINUSITIS- ACUTE-NOS - Primary  ? Relevant Medications  ? amoxicillin-clavulanate (AUGMENTIN) 875-125 MG tablet  ? fluticasone (FLONASE) 50 MCG/ACT nasal spray  ? promethazine-dextromethorp

## 2021-08-28 ENCOUNTER — Encounter: Payer: Self-pay | Admitting: Family Medicine

## 2021-09-14 ENCOUNTER — Ambulatory Visit (HOSPITAL_BASED_OUTPATIENT_CLINIC_OR_DEPARTMENT_OTHER)
Admission: RE | Admit: 2021-09-14 | Discharge: 2021-09-14 | Disposition: A | Discharge status: Home / Self Care | Payer: 59 | Source: Ambulatory Visit | Attending: Family Medicine | Admitting: Family Medicine

## 2021-09-14 ENCOUNTER — Ambulatory Visit (INDEPENDENT_AMBULATORY_CARE_PROVIDER_SITE_OTHER): Payer: 59 | Admitting: Family Medicine

## 2021-09-14 ENCOUNTER — Encounter: Payer: Self-pay | Admitting: Family Medicine

## 2021-09-14 VITALS — BP 146/100 | HR 91 | Temp 101.6°F | Resp 18 | Ht 67.0 in | Wt 186.2 lb

## 2021-09-14 DIAGNOSIS — J40 Bronchitis, not specified as acute or chronic: Secondary | ICD-10-CM | POA: Insufficient documentation

## 2021-09-14 DIAGNOSIS — J0140 Acute pansinusitis, unspecified: Secondary | ICD-10-CM | POA: Diagnosis not present

## 2021-09-14 HISTORY — DX: Bronchitis, not specified as acute or chronic: J40

## 2021-09-14 MED ORDER — PREDNISONE 10 MG PO TABS: ORAL_TABLET | ORAL | 0 refills | Status: DC

## 2021-09-14 MED ORDER — PROMETHAZINE-DM 6.25-15 MG/5ML PO SYRP: 5.0000 mL | ORAL_SOLUTION | Freq: Four times a day (QID) | ORAL | 0 refills | Status: DC | PRN

## 2021-09-14 MED ORDER — AZITHROMYCIN 250 MG PO TABS: ORAL_TABLET | ORAL | 0 refills | Status: DC

## 2021-09-14 NOTE — Patient Instructions (Signed)
Acute Bronchitis, Adult ? ?Acute bronchitis is sudden inflammation of the main airways (bronchi) that come off the windpipe (trachea) in the lungs. The swelling causes the airways to get smaller and make more mucus than normal. This can make it hard to breathe and can cause coughing or noisy breathing (wheezing). ?Acute bronchitis may last several weeks. The cough may last longer. Allergies, asthma, and exposure to smoke may make the condition worse. ?What are the causes? ?This condition can be caused by germs and by substances that irritate the lungs, including: ?Cold and flu viruses. The most common cause of this condition is the virus that causes the common cold. ?Bacteria. This is less common. ?Breathing in substances that irritate the lungs, including: ?Smoke from cigarettes and other forms of tobacco. ?Dust and pollen. ?Fumes from household cleaning products, gases, or burned fuel. ?Indoor or outdoor air pollution. ?What increases the risk? ?The following factors may make you more likely to develop this condition: ?A weak body's defense system, also called the immune system. ?A condition that affects your lungs and breathing, such as asthma. ?What are the signs or symptoms? ?Common symptoms of this condition include: ?Coughing. This may bring up clear, yellow, or green mucus from your lungs (sputum). ?Wheezing. ?Runny or stuffy nose. ?Having too much mucus in your lungs (chest congestion). ?Shortness of breath. ?Aches and pains, including sore throat or chest. ?How is this diagnosed? ?This condition is usually diagnosed based on: ?Your symptoms and medical history. ?A physical exam. ?You may also have other tests, including tests to rule out other conditions, such as pneumonia. These tests include: ?A test of lung function. ?Test of a mucus sample to look for the presence of bacteria. ?Tests to check the oxygen level in your blood. ?Blood tests. ?Chest X-ray. ?How is this treated? ?Most cases of acute  bronchitis clear up over time without treatment. Your health care provider may recommend: ?Drinking more fluids to help thin your mucus so it is easier to cough up. ?Taking inhaled medicine (inhaler) to improve air flow in and out of your lungs. ?Using a vaporizer or a humidifier. These are machines that add water to the air to help you breathe better. ?Taking a medicine that thins mucus and clears congestion (expectorant). ?Taking a medicine that prevents or stops coughing (cough suppressant). ?It is notcommon to take an antibiotic medicine for this condition. ?Follow these instructions at home: ? ?Take over-the-counter and prescription medicines only as told by your health care provider. ?Use an inhaler, vaporizer, or humidifier as told by your health care provider. ?Take two teaspoons (10 mL) of honey at bedtime to lessen coughing at night. ?Drink enough fluid to keep your urine pale yellow. ?Do not use any products that contain nicotine or tobacco. These products include cigarettes, chewing tobacco, and vaping devices, such as e-cigarettes. If you need help quitting, ask your health care provider. ?Get plenty of rest. ?Return to your normal activities as told by your health care provider. Ask your health care provider what activities are safe for you. ?Keep all follow-up visits. This is important. ?How is this prevented? ?To lower your risk of getting this condition again: ?Wash your hands often with soap and water for at least 20 seconds. If soap and water are not available, use hand sanitizer. ?Avoid contact with people who have cold symptoms. ?Try not to touch your mouth, nose, or eyes with your hands. ?Avoid breathing in smoke or chemical fumes. Breathing smoke or chemical fumes will make your   condition worse. ?Get the flu shot every year. ?Contact a health care provider if: ?Your symptoms do not improve after 2 weeks. ?You have trouble coughing up the mucus. ?Your cough keeps you awake at night. ?You have a  fever. ?Get help right away if you: ?Cough up blood. ?Feel pain in your chest. ?Have severe shortness of breath. ?Faint or keep feeling like you are going to faint. ?Have a severe headache. ?Have a fever or chills that get worse. ?These symptoms may represent a serious problem that is an emergency. Do not wait to see if the symptoms will go away. Get medical help right away. Call your local emergency services (911 in the [place] Do not drive yourself to the hospital. ?Summary ?Acute bronchitis is inflammation of the main airways (bronchi) that come off the windpipe (trachea) in the lungs. The swelling causes the airways to get smaller and make more mucus than normal. ?Drinking more fluids can help thin your mucus so it is easier to cough up. ?Take over-the-counter and prescription medicines only as told by your health care provider. ?Do not use any products that contain nicotine or tobacco. These products include cigarettes, chewing tobacco, and vaping devices, such as e-cigarettes. If you need help quitting, ask your health care provider. ?Contact a health care provider if your symptoms do not improve after 2 weeks. ?This information is not intended to replace advice given to you by your health care provider. Make sure you discuss any questions you have with your health care provider. ?Document Revised: 08/10/2020 Document Reviewed: 08/10/2020 ?Elsevier Patient Education ? 2023 Elsevier Inc[employer] ? ?

## 2021-09-14 NOTE — Progress Notes (Addendum)
Subjective:   By signing my name below, I, [name], attest that this documentation has been prepared under the direction and in the presence of Dr. Roma Schanz, DO. 09/14/2021    Patient ID: Angela Garner, female    DOB: January 27, 1961, 61 y.o.   MRN: [id number]  Chief Complaint  Patient presents with   Generalized Body Aches    Pt states sxs started Monday and states having body aches, productive cough, stuffy nose. Negative COVID last night    HPI Patient is in today for a office visit.   She complains of body aches, sneezing, cough, and congestion, chest congestion since Monday, 09/11/2021. She developed a fever yesterday. She has sputum production with her cough. She also has wheezing with her cough. She denies having any sore throat. She has difficulty sleeping due to her cough. She has not used inhalers in the past. She is currently taking cough medication she was prescribed for a previous cough. She continues using Flonase nasal sprays.  She recently completed an anti-biotic course for unrelated symptoms.    Past Medical History:  Diagnosis Date   Allergy    Diabetes mellitus without complication ([facility])    no meds, only check bs   Heart murmur    Hyperlipidemia    Hypertension     Past Surgical History:  Procedure Laterality Date   UTERINE FIBROID SURGERY      Family History  Problem Relation Age of Onset   Dementia Mother    Hypertension Father    Diabetes Father    Heart attack Father    Diabetes Sister        she had stomach pain-- cause unknown   Clotting disorder Brother    Colon cancer Neg Hx    Esophageal cancer Neg Hx    Stomach cancer Neg Hx    Rectal cancer Neg Hx     Social History   Socioeconomic History   Marital status: Divorced    Spouse name: Not on file   Number of children: Not on file   Years of education: Not on file   Highest education level: Not on file  Occupational History   Occupation: [employer]     Comment:  [employer]  Tobacco Use   Smoking status: Never   Smokeless tobacco: Never  Vaping Use   Vaping Use: Never used  Substance and Sexual Activity   Alcohol use: No   Drug use: No   Sexual activity: Yes  Other Topics Concern   Not on file  Social History Narrative   Not on file   Social Determinants of Health   Financial Resource Strain: Not on file  Food Insecurity: Not on file  Transportation Needs: Not on file  Physical Activity: Not on file  Stress: Not on file  Social Connections: Not on file  Intimate Partner Violence: Not on file    Outpatient Medications Prior to Visit  Medication Sig Dispense Refill   amLODipine (NORVASC) 5 MG tablet Take 1 tablet by mouth once daily 90 tablet 0   Estradiol ([facility]) Place vaginally.     fluticasone (FLONASE) 50 MCG/ACT nasal spray Place 2 sprays into both nostrils daily. 16 g 6   loratadine (CLARITIN) 10 MG tablet Take 1 tablet (10 mg total) by mouth daily. 30 tablet 11   lovastatin (MEVACOR) 20 MG tablet Take 1 tablet (20 mg total) by mouth at bedtime. 30 tablet 2   multivitamin-iron-minerals-folic acid (CENTRUM) chewable tablet  Chew 1 tablet by mouth daily.     NONFORMULARY OR COMPOUNDED ITEM Compression stocking -- thigh high  20-30 mm/hg #1  Dx varicose veint 1 each 0   olmesartan (BENICAR) 20 MG tablet Take 1 tablet (20 mg total) by mouth daily. Needs office visit before any further refills. 90 tablet 1   oxybutynin (DITROPAN-XL) 10 MG 24 hr tablet Take 10 mg by mouth at bedtime.     Semaglutide (RYBELSUS) 7 MG TABS Take 1 tablet by mouth daily. 90 tablet 1   amoxicillin-clavulanate (AUGMENTIN) 875-125 MG tablet Take 1 tablet by mouth 2 (two) times daily. 20 tablet 0   promethazine-dextromethorphan (PROMETHAZINE-DM) 6.25-15 MG/5ML syrup Take 5 mLs by mouth 4 (four) times daily as needed. 118 mL 0   No facility-administered medications prior to visit.    Allergies  Allergen Reactions   Metformin And Related     Pravachol [Pravastatin Sodium] Other (See Comments)    Muscle aches   Simvastatin     "leg pains"    Review of Systems  Constitutional:  Positive for fever.  HENT:  Positive for congestion. Negative for sore throat.   Respiratory:  Positive for cough, sputum production and wheezing (with cough).        (+)chest congestion  Musculoskeletal:  Positive for myalgias (general body aches).      Objective:    Physical Exam Vitals and nursing note reviewed.  Constitutional:      General: She is not in acute distress.    Appearance: Normal appearance. She is not ill-appearing.  HENT:     Head: Normocephalic and atraumatic.     Right Ear: Tympanic membrane, ear canal and external ear normal.     Left Ear: Tympanic membrane, ear canal and external ear normal.  Eyes:     Extraocular Movements: Extraocular movements intact.     Pupils: Pupils are equal, round, and reactive to light.  Cardiovascular:     Rate and Rhythm: Normal rate and regular rhythm.     Heart sounds: Normal heart sounds. No murmur heard.   No gallop.  Pulmonary:     Effort: Pulmonary effort is normal. No respiratory distress.     Breath sounds: Decreased air movement present. Decreased breath sounds present. No wheezing or rales.  Skin:    General: Skin is warm and dry.  Neurological:     Mental Status: She is alert and oriented to person, place, and time.  Psychiatric:        Judgment: Judgment normal.    BP (!) 146/100 (BP Location: Left Arm, Patient Position: Sitting, Cuff Size: Normal)   Pulse 91   Temp (!) 101.6 F (38.7 C) (Oral)   Resp 18   Ht '5\' 7"'$  (1.702 m)   Wt 186 lb 3.2 oz (84.5 kg)   SpO2 96%   BMI 29.16 kg/m  Wt Readings from Last 3 Encounters:  09/14/21 186 lb 3.2 oz (84.5 kg)  08/22/21 189 lb 6.4 oz (85.9 kg)  06/07/21 181 lb (82.1 kg)    Diabetic Foot Exam - Simple   No data filed    Lab Results  Component Value Date   WBC 4.5 09/20/2020   HGB 14.7 09/20/2020   HCT 43.8  09/20/2020   PLT 181.0 09/20/2020   GLUCOSE 115 (H) 06/19/2021   CHOL 201 (H) 06/19/2021   TRIG 68.0 06/19/2021   HDL 76.00 06/19/2021   LDLDIRECT 120.9 08/02/2011   LDLCALC 111 (H) 06/19/2021   ALT 18 06/19/2021  AST 20 06/19/2021   NA 141 06/19/2021   K 3.4 (L) 06/19/2021   CL 103 06/19/2021   CREATININE 0.77 06/19/2021   BUN 12 06/19/2021   CO2 32 06/19/2021   TSH 2.98 09/20/2020   HGBA1C 6.2 06/19/2021   MICROALBUR 3.1 (H) 06/19/2021    Lab Results  Component Value Date   TSH 2.98 09/20/2020   Lab Results  Component Value Date   WBC 4.5 09/20/2020   HGB 14.7 09/20/2020   HCT 43.8 09/20/2020   MCV 93.9 09/20/2020   PLT 181.0 09/20/2020   Lab Results  Component Value Date   NA 141 06/19/2021   K 3.4 (L) 06/19/2021   CO2 32 06/19/2021   GLUCOSE 115 (H) 06/19/2021   BUN 12 06/19/2021   CREATININE 0.77 06/19/2021   BILITOT 0.7 06/19/2021   ALKPHOS 69 06/19/2021   AST 20 06/19/2021   ALT 18 06/19/2021   PROT 7.3 06/19/2021   ALBUMIN 4.5 06/19/2021   CALCIUM 9.8 06/19/2021   GFR 83.08 06/19/2021   Lab Results  Component Value Date   CHOL 201 (H) 06/19/2021   Lab Results  Component Value Date   HDL 76.00 06/19/2021   Lab Results  Component Value Date   LDLCALC 111 (H) 06/19/2021   Lab Results  Component Value Date   TRIG 68.0 06/19/2021   Lab Results  Component Value Date   CHOLHDL 3 06/19/2021   Lab Results  Component Value Date   HGBA1C 6.2 06/19/2021       Assessment & Plan:   Problem List Items Addressed This Visit       Unprioritized   SINUSITIS- ACUTE-NOS   Relevant Medications   predniSONE (DELTASONE) 10 MG tablet   azithromycin (ZITHROMAX Z-PAK) 250 MG tablet   promethazine-dextromethorphan (PROMETHAZINE-DM) 6.25-15 MG/5ML syrup   Other Relevant Orders   CBC with Differential/Platelet   Comprehensive metabolic panel   Bronchitis - Primary    pred taper and z pak Cough med per orders  rto prn        Relevant  Medications   predniSONE (DELTASONE) 10 MG tablet   azithromycin (ZITHROMAX Z-PAK) 250 MG tablet   promethazine-dextromethorphan (PROMETHAZINE-DM) 6.25-15 MG/5ML syrup   Other Relevant Orders   DG Chest 2 View   CBC with Differential/Platelet   Comprehensive metabolic panel     Meds ordered this encounter  Medications   predniSONE (DELTASONE) 10 MG tablet    Sig: TAKE 3 TABLETS PO QD FOR 3 DAYS THEN TAKE 2 TABLETS PO QD FOR 3 DAYS THEN TAKE 1 TABLET PO QD FOR 3 DAYS THEN TAKE 1/2 TAB PO QD FOR 3 DAYS    Dispense:  20 tablet    Refill:  0   azithromycin (ZITHROMAX Z-PAK) 250 MG tablet    Sig: As directed    Dispense:  6 each    Refill:  0   promethazine-dextromethorphan (PROMETHAZINE-DM) 6.25-15 MG/5ML syrup    Sig: Take 5 mLs by mouth 4 (four) times daily as needed.    Dispense:  118 mL    Refill:  0    I, [name], DO, personally preformed the services described in this documentation.  All medical record entries made by the scribe were at my direction and in my presence.  I have reviewed the chart and discharge instructions (if applicable) and agree that the record reflects my personal performance and is accurate and complete. 09/14/2021   I,[name],acting as a Education administrator for [employer]  Carollee Herter, DO.,have documented all relevant documentation on the behalf of [name], DO,as directed by  [name], DO while in the presence of [name], DO.   [name], DO

## 2021-09-14 NOTE — Assessment & Plan Note (Signed)
pred taper and z pak Cough med per orders  rto prn

## 2021-09-15 LAB — CBC WITH DIFFERENTIAL/PLATELET
Basophils Absolute: 0 10*3/uL (ref 0.0–0.1)
Basophils Relative: 0.5 % (ref 0.0–3.0)
Eosinophils Absolute: 0 10*3/uL (ref 0.0–0.7)
Eosinophils Relative: 0.2 % (ref 0.0–5.0)
HCT: 40.1 % (ref 36.0–46.0)
Hemoglobin: 13.5 g/dL (ref 12.0–15.0)
Lymphocytes Relative: 25.8 % (ref 12.0–46.0)
Lymphs Abs: 1 10*3/uL (ref 0.7–4.0)
MCHC: 33.6 g/dL (ref 30.0–36.0)
MCV: 93.8 fl (ref 78.0–100.0)
Monocytes Absolute: 0.6 10*3/uL (ref 0.1–1.0)
Monocytes Relative: 14.7 % — ABNORMAL HIGH (ref 3.0–12.0)
Neutro Abs: 2.4 10*3/uL (ref 1.4–7.7)
Neutrophils Relative %: 58.8 % (ref 43.0–77.0)
Platelets: 148 10*3/uL — ABNORMAL LOW (ref 150.0–400.0)
RBC: 4.27 Mil/uL (ref 3.87–5.11)
RDW: 13.7 % (ref 11.5–15.5)
WBC: 4.1 10*3/uL (ref 4.0–10.5)

## 2021-09-15 LAB — COMPREHENSIVE METABOLIC PANEL
ALT: 16 U/L (ref 0–35)
AST: 20 U/L (ref 0–37)
Albumin: 4.1 g/dL (ref 3.5–5.2)
Alkaline Phosphatase: 60 U/L (ref 39–117)
BUN: 11 mg/dL (ref 6–23)
CO2: 27 mEq/L (ref 19–32)
Calcium: 9.1 mg/dL (ref 8.4–10.5)
Chloride: 100 mEq/L (ref 96–112)
Creatinine, Ser: 0.94 mg/dL (ref 0.40–1.20)
GFR: 65.28 mL/min (ref >60.00)
Glucose, Bld: 142 mg/dL — ABNORMAL HIGH (ref 70–99)
Potassium: 3.7 mEq/L (ref 3.5–5.1)
Sodium: 136 mEq/L (ref 135–145)
Total Bilirubin: 0.6 mg/dL (ref 0.2–1.2)
Total Protein: 6.8 g/dL (ref 6.0–8.3)

## 2021-09-27 ENCOUNTER — Other Ambulatory Visit: Payer: Self-pay | Admitting: Family Medicine

## 2021-09-27 DIAGNOSIS — R739 Hyperglycemia, unspecified: Secondary | ICD-10-CM

## 2021-09-28 ENCOUNTER — Other Ambulatory Visit (INDEPENDENT_AMBULATORY_CARE_PROVIDER_SITE_OTHER): Payer: 59

## 2021-09-28 DIAGNOSIS — R739 Hyperglycemia, unspecified: Secondary | ICD-10-CM

## 2021-09-28 LAB — BASIC METABOLIC PANEL
BUN: 13 mg/dL (ref 6–23)
CO2: 31 mEq/L (ref 19–32)
Calcium: 9.4 mg/dL (ref 8.4–10.5)
Chloride: 104 mEq/L (ref 96–112)
Creatinine, Ser: 0.77 mg/dL (ref 0.40–1.20)
GFR: 82.92 mL/min (ref >60.00)
Glucose, Bld: 118 mg/dL — ABNORMAL HIGH (ref 70–99)
Potassium: 3.8 mEq/L (ref 3.5–5.1)
Sodium: 143 mEq/L (ref 135–145)

## 2021-09-28 LAB — HEMOGLOBIN A1C: Hgb A1c MFr Bld: 6.6 % — ABNORMAL HIGH (ref 4.6–6.5)

## 2021-10-01 ENCOUNTER — Other Ambulatory Visit: Payer: Self-pay | Admitting: Family Medicine

## 2021-10-01 DIAGNOSIS — E1165 Type 2 diabetes mellitus with hyperglycemia: Secondary | ICD-10-CM

## 2021-10-02 ENCOUNTER — Other Ambulatory Visit: Payer: Self-pay

## 2021-10-02 ENCOUNTER — Telehealth: Payer: Self-pay | Admitting: Family Medicine

## 2021-10-02 DIAGNOSIS — E1165 Type 2 diabetes mellitus with hyperglycemia: Secondary | ICD-10-CM

## 2021-10-02 MED ORDER — SEMAGLUTIDE 14 MG PO TABS
1.0000 | ORAL_TABLET | Freq: Every day | ORAL | 1 refills | Status: DC
Start: 2021-10-02 — End: 2022-01-17

## 2021-10-02 NOTE — Telephone Encounter (Signed)
Patient sched my chart for high BP. Called patient and trans to triage regarding symptoms.

## 2021-10-02 NOTE — Telephone Encounter (Signed)
Noted  

## 2021-10-02 NOTE — Telephone Encounter (Signed)
Call Type Triage / Clinical Relationship To Patient Self Return Phone Number [phone] (Primary) Chief Complaint Dizziness Reason for Call Symptomatic / Request for Health Information Initial Comment Caller states they are experiencing lightheaded and dizzy and elevated heart rate. Translation No Nurse Assessment Nurse: Hardin Negus, RN, Mardene Celeste Date/Time Eilene Ghazi Time): 10/02/2021 11:20:56 AM Confirm and document reason for call. If symptomatic, describe symptoms. ---She is lightheaded,dizzy and elevated heart rate. The s+s started last week, She thought she eaten to fast or has not taken her medications. She took her blood pressure on Saturday and it was high. She is feeling much better today.  10/02/2021 11:26:48 AM SEE PCP WITHIN 3 DAYS Yes Hardin Negus, RN, Mardene Celeste

## 2021-10-02 NOTE — Telephone Encounter (Signed)
Patient has an appt tomorrow

## 2021-10-03 ENCOUNTER — Ambulatory Visit (INDEPENDENT_AMBULATORY_CARE_PROVIDER_SITE_OTHER): Payer: 59 | Admitting: Family Medicine

## 2021-10-03 ENCOUNTER — Encounter: Payer: Self-pay | Admitting: Family Medicine

## 2021-10-03 VITALS — BP 124/94 | HR 81 | Temp 98.5°F | Resp 18 | Ht 67.0 in | Wt 185.8 lb

## 2021-10-03 DIAGNOSIS — R002 Palpitations: Secondary | ICD-10-CM | POA: Diagnosis not present

## 2021-10-03 DIAGNOSIS — E785 Hyperlipidemia, unspecified: Secondary | ICD-10-CM

## 2021-10-03 DIAGNOSIS — E1165 Type 2 diabetes mellitus with hyperglycemia: Secondary | ICD-10-CM | POA: Diagnosis not present

## 2021-10-03 DIAGNOSIS — E1169 Type 2 diabetes mellitus with other specified complication: Secondary | ICD-10-CM

## 2021-10-03 DIAGNOSIS — I10 Essential (primary) hypertension: Secondary | ICD-10-CM | POA: Diagnosis not present

## 2021-10-03 DIAGNOSIS — R1013 Epigastric pain: Secondary | ICD-10-CM | POA: Diagnosis not present

## 2021-10-03 MED ORDER — OLMESARTAN MEDOXOMIL 40 MG PO TABS: 40.0000 mg | ORAL_TABLET | Freq: Every day | ORAL | 1 refills | Status: DC

## 2021-10-03 MED ORDER — FAMOTIDINE 20 MG PO TABS: 20.0000 mg | ORAL_TABLET | Freq: Two times a day (BID) | ORAL | 5 refills | Status: AC

## 2021-10-03 NOTE — Progress Notes (Addendum)
Subjective:   By signing my name below, I, Angela Garner, attest that this documentation has been prepared under the direction and in the presence of Angela Held, DO  10/03/2021    Patient ID: Angela Garner, female    DOB: 03-Jun-1960, 61 y.o.   MRN: [id number]  Chief Complaint  Patient presents with   Hypertension    09/30/21 (152/94) 10/01/21 (153/91 -140/94) 10/02/21 (145/92 - 141/90) experiencing dizziness    Follow-up    Hypertension Pertinent negatives include no blurred vision, chest pain, headaches, malaise/fatigue, palpitations or shortness of breath.   Patient is in today for a follow up visit.   Her blood pressure is elevated at home in 09/29/2021. Her blood pressure measured 152/94 on 09/30/2021, 153/91-140/94 on 10/01/2021, 145/92-141/90 on 10/02/2021. She is also experiencing occasional dizziness. She is measuring her blood sugar as well and reports it is measuring slightly elevated. She continues taking 5 mg amlodipine daily PO. She continues taking 20 mg olmesartan and reports doubling her dose to 40 mg since her blood pressure started reading higher.  BP Readings from Last 3 Encounters:  10/03/21 (!) 124/94  09/14/21 (!) 146/100  08/22/21 (!) 110/92   Pulse Readings from Last 3 Encounters:  10/03/21 81  09/14/21 91  08/22/21 91   She also complains of bloating since 09/29/2021. She has not tried medication to manage her symptoms. He is cutting down on her salt intake and increasing the amount of vegetables in her diet.    Past Medical History:  Diagnosis Date   Allergy    Diabetes mellitus without complication (HCC)    no meds, only check bs   Heart murmur    Hyperlipidemia    Hypertension     Past Surgical History:  Procedure Laterality Date   UTERINE FIBROID SURGERY      Family History  Problem Relation Age of Onset   Dementia Mother    Hypertension Father    Diabetes Father    Heart attack Father    Diabetes Sister        she had stomach  pain-- cause unknown   Clotting disorder Brother    Colon cancer Neg Hx    Esophageal cancer Neg Hx    Stomach cancer Neg Hx    Rectal cancer Neg Hx     Social History   Socioeconomic History   Marital status: Divorced    Spouse name: Not on file   Number of children: Not on file   Years of education: Not on file   Highest education level: Not on file  Occupational History   Occupation: [employer]     Comment: [employer]  Tobacco Use   Smoking status: Never   Smokeless tobacco: Never  Vaping Use   Vaping Use: Never used  Substance and Sexual Activity   Alcohol use: No   Drug use: No   Sexual activity: Yes  Other Topics Concern   Not on file  Social History Narrative   Not on file   Social Determinants of Health   Financial Resource Strain: Not on file  Food Insecurity: Not on file  Transportation Needs: Not on file  Physical Activity: Not on file  Stress: Not on file  Social Connections: Not on file  Intimate Partner Violence: Not on file    Outpatient Medications Prior to Visit  Medication Sig Dispense Refill   amLODipine (NORVASC) 5 MG tablet Take 1 tablet by mouth once daily 90 tablet  0   Estradiol (YUVAFEM VA) Place vaginally.     fluticasone (FLONASE) 50 MCG/ACT nasal spray Place 2 sprays into both nostrils daily. 16 g 6   loratadine (CLARITIN) 10 MG tablet Take 1 tablet (10 mg total) by mouth daily. 30 tablet 11   lovastatin (MEVACOR) 20 MG tablet Take 1 tablet (20 mg total) by mouth at bedtime. 30 tablet 2   multivitamin-iron-minerals-folic acid (CENTRUM) chewable tablet Chew 1 tablet by mouth daily.     NONFORMULARY OR COMPOUNDED ITEM Compression stocking -- thigh high  20-30 mm/hg #1  Dx varicose veint 1 each 0   oxybutynin (DITROPAN-XL) 10 MG 24 hr tablet Take 10 mg by mouth at bedtime.     Semaglutide 14 MG TABS Take 1 tablet (14 mg total) by mouth daily. 90 tablet 1   olmesartan (BENICAR) 20 MG tablet Take 1 tablet (20 mg total) by mouth  daily. Needs office visit before any further refills. 90 tablet 1   azithromycin (ZITHROMAX Z-PAK) 250 MG tablet As directed (Patient not taking: Reported on 10/03/2021) 6 each 0   predniSONE (DELTASONE) 10 MG tablet TAKE 3 TABLETS PO QD FOR 3 DAYS THEN TAKE 2 TABLETS PO QD FOR 3 DAYS THEN TAKE 1 TABLET PO QD FOR 3 DAYS THEN TAKE 1/2 TAB PO QD FOR 3 DAYS (Patient not taking: Reported on 10/03/2021) 20 tablet 0   promethazine-dextromethorphan (PROMETHAZINE-DM) 6.25-15 MG/5ML syrup Take 5 mLs by mouth 4 (four) times daily as needed. (Patient not taking: Reported on 10/03/2021) 118 mL 0   No facility-administered medications prior to visit.    Allergies  Allergen Reactions   Metformin And Related    Pravachol [Pravastatin Sodium] Other (See Comments)    Muscle aches   Simvastatin     "leg pains"    Review of Systems  Constitutional:  Negative for fever and malaise/fatigue.  HENT:  Negative for congestion.   Eyes:  Negative for blurred vision.  Respiratory:  Negative for cough and shortness of breath.   Cardiovascular:  Negative for chest pain, palpitations and leg swelling.  Gastrointestinal:  Negative for vomiting.  Musculoskeletal:  Negative for back pain.  Skin:  Negative for rash.  Neurological:  Positive for dizziness. Negative for loss of consciousness and headaches.       Objective:    Physical Exam Vitals and nursing note reviewed.  Constitutional:      General: She is not in acute distress.    Appearance: Normal appearance. She is well-developed. She is not ill-appearing.  HENT:     Head: Normocephalic and atraumatic.     Right Ear: External ear normal.     Left Ear: External ear normal.  Eyes:     Extraocular Movements: Extraocular movements intact.     Conjunctiva/sclera: Conjunctivae normal.     Pupils: Pupils are equal, round, and reactive to light.  Neck:     Thyroid: No thyromegaly.     Vascular: No carotid bruit or JVD.  Cardiovascular:     Rate and Rhythm:  Normal rate and regular rhythm.     Heart sounds: Normal heart sounds. No murmur heard.    No gallop.  Pulmonary:     Effort: Pulmonary effort is normal. No respiratory distress.     Breath sounds: Normal breath sounds. No wheezing or rales.  Chest:     Chest wall: No tenderness.  Musculoskeletal:     Cervical back: Normal range of motion and neck supple.  Skin:  General: Skin is warm and dry.  Neurological:     Mental Status: She is alert and oriented to person, place, and time.  Psychiatric:        Judgment: Judgment normal.     BP (!) 124/94 (BP Location: Left Arm, Patient Position: Sitting, Cuff Size: Normal)   Pulse 81   Temp 98.5 F (36.9 C) (Oral)   Resp 18   Ht '5\' 7"'$  (1.702 m)   Wt 185 lb 12.8 oz (84.3 kg)   SpO2 98%   BMI 29.10 kg/m  Wt Readings from Last 3 Encounters:  10/03/21 185 lb 12.8 oz (84.3 kg)  09/14/21 186 lb 3.2 oz (84.5 kg)  08/22/21 189 lb 6.4 oz (85.9 kg)    Diabetic Foot Exam - Simple   No data filed    Lab Results  Component Value Date   WBC 4.1 09/14/2021   HGB 13.5 09/14/2021   HCT 40.1 09/14/2021   PLT 148.0 (L) 09/14/2021   GLUCOSE 118 (H) 09/28/2021   CHOL 201 (H) 06/19/2021   TRIG 68.0 06/19/2021   HDL 76.00 06/19/2021   LDLDIRECT 120.9 08/02/2011   LDLCALC 111 (H) 06/19/2021   ALT 16 09/14/2021   AST 20 09/14/2021   NA 143 09/28/2021   K 3.8 09/28/2021   CL 104 09/28/2021   CREATININE 0.77 09/28/2021   BUN 13 09/28/2021   CO2 31 09/28/2021   TSH 2.98 09/20/2020   HGBA1C 6.6 (H) 09/28/2021   MICROALBUR 3.1 (H) 06/19/2021    Lab Results  Component Value Date   TSH 2.98 09/20/2020   Lab Results  Component Value Date   WBC 4.1 09/14/2021   HGB 13.5 09/14/2021   HCT 40.1 09/14/2021   MCV 93.8 09/14/2021   PLT 148.0 (L) 09/14/2021   Lab Results  Component Value Date   NA 143 09/28/2021   K 3.8 09/28/2021   CO2 31 09/28/2021   GLUCOSE 118 (H) 09/28/2021   BUN 13 09/28/2021   CREATININE 0.77 09/28/2021    BILITOT 0.6 09/14/2021   ALKPHOS 60 09/14/2021   AST 20 09/14/2021   ALT 16 09/14/2021   PROT 6.8 09/14/2021   ALBUMIN 4.1 09/14/2021   CALCIUM 9.4 09/28/2021   GFR 82.92 09/28/2021   Lab Results  Component Value Date   CHOL 201 (H) 06/19/2021   Lab Results  Component Value Date   HDL 76.00 06/19/2021   Lab Results  Component Value Date   LDLCALC 111 (H) 06/19/2021   Lab Results  Component Value Date   TRIG 68.0 06/19/2021   Lab Results  Component Value Date   CHOLHDL 3 06/19/2021   Lab Results  Component Value Date   HGBA1C 6.6 (H) 09/28/2021       Assessment & Plan:   Problem List Items Addressed This Visit       Unprioritized   Primary hypertension - Primary   Relevant Medications   olmesartan (BENICAR) 40 MG tablet   Uncontrolled type 2 diabetes mellitus with hyperglycemia ([place])    hgba1c to be checked , minimize simple carbs. Increase exercise as tolerated. Continue current meds      Relevant Medications   olmesartan (BENICAR) 40 MG tablet   Palpitations    With dizziness This has been an ongoing problem No chest pain Refer to cardiology      Relevant Orders   Cardiac event monitor   Ambulatory referral to Cardiology   Hyperlipidemia associated with type 2 diabetes mellitus ([place])  Tolerating statin, encouraged heart healthy diet, avoid trans fats, minimize simple carbs and saturated fats. Increase exercise as tolerated      Relevant Medications   olmesartan (BENICAR) 40 MG tablet   Essential hypertension    Poorly controlled will alter medications, encouraged DASH diet, minimize caffeine and obtain adequate sleep. Report concerning symptoms and follow up as directed and as needed  Inc benicar 40 mg daily  F/u 2-3 weeks       Relevant Medications   olmesartan (BENICAR) 40 MG tablet   Other Visit Diagnoses     Dyspepsia       Relevant Medications   famotidine (PEPCID) 20 MG tablet   Other Relevant Orders   Comprehensive metabolic  panel   H. pylori antibody, IgG   CBC with Differential/Platelet        Meds ordered this encounter  Medications   olmesartan (BENICAR) 40 MG tablet    Sig: Take 1 tablet (40 mg total) by mouth daily.    Dispense:  90 tablet    Refill:  1   famotidine (PEPCID) 20 MG tablet    Sig: Take 1 tablet (20 mg total) by mouth 2 (two) times daily.    Dispense:  60 tablet    Refill:  5    I, Angela Held, DO, personally preformed the services described in this documentation.  All medical record entries made by the scribe were at my direction and in my presence.  I have reviewed the chart and discharge instructions (if applicable) and agree that the record reflects my personal performance and is accurate and complete. 10/03/2021   I,Angela Garner,acting as a Education administrator for [employer], DO.,have documented all relevant documentation on the behalf of Angela Held, DO,as directed by  Angela Held, DO while in the presence of Angela Held, DO.   Angela Held, DO

## 2021-10-03 NOTE — Patient Instructions (Signed)

## 2021-10-03 NOTE — Assessment & Plan Note (Signed)
hgba1c to be checked, minimize simple carbs. Increase exercise as tolerated. Continue current meds  

## 2021-10-03 NOTE — Assessment & Plan Note (Addendum)
Poorly controlled will alter medications, encouraged DASH diet, minimize caffeine and obtain adequate sleep. Report concerning symptoms and follow up as directed and as needed  Inc benicar 40 mg daily  F/u 2-3 weeks

## 2021-10-03 NOTE — Assessment & Plan Note (Signed)
Tolerating statin, encouraged heart healthy diet, avoid trans fats, minimize simple carbs and saturated fats. Increase exercise as tolerated 

## 2021-10-03 NOTE — Assessment & Plan Note (Signed)
With dizziness This has been an ongoing problem No chest pain Refer to cardiology

## 2021-10-04 LAB — COMPREHENSIVE METABOLIC PANEL
ALT: 15 U/L (ref 0–35)
AST: 18 U/L (ref 0–37)
Albumin: 4.3 g/dL (ref 3.5–5.2)
Alkaline Phosphatase: 62 U/L (ref 39–117)
BUN: 13 mg/dL (ref 6–23)
CO2: 27 mEq/L (ref 19–32)
Calcium: 10.1 mg/dL (ref 8.4–10.5)
Chloride: 101 mEq/L (ref 96–112)
Creatinine, Ser: 0.78 mg/dL (ref 0.40–1.20)
GFR: 81.64 mL/min (ref >60.00)
Glucose, Bld: 115 mg/dL — ABNORMAL HIGH (ref 70–99)
Potassium: 3.9 mEq/L (ref 3.5–5.1)
Sodium: 136 mEq/L (ref 135–145)
Total Bilirubin: 0.8 mg/dL (ref 0.2–1.2)
Total Protein: 7.1 g/dL (ref 6.0–8.3)

## 2021-10-04 LAB — CBC WITH DIFFERENTIAL/PLATELET
Basophils Absolute: 0 10*3/uL (ref 0.0–0.1)
Basophils Relative: 1.1 % (ref 0.0–3.0)
Eosinophils Absolute: 0 10*3/uL (ref 0.0–0.7)
Eosinophils Relative: 0.7 % (ref 0.0–5.0)
HCT: 41.5 % (ref 36.0–46.0)
Hemoglobin: 13.9 g/dL (ref 12.0–15.0)
Lymphocytes Relative: 45 % (ref 12.0–46.0)
Lymphs Abs: 2 10*3/uL (ref 0.7–4.0)
MCHC: 33.4 g/dL (ref 30.0–36.0)
MCV: 94.1 fl (ref 78.0–100.0)
Monocytes Absolute: 0.4 10*3/uL (ref 0.1–1.0)
Monocytes Relative: 10.2 % (ref 3.0–12.0)
Neutro Abs: 1.9 10*3/uL (ref 1.4–7.7)
Neutrophils Relative %: 43 % (ref 43.0–77.0)
Platelets: 190 10*3/uL (ref 150.0–400.0)
RBC: 4.41 Mil/uL (ref 3.87–5.11)
RDW: 13.8 % (ref 11.5–15.5)
WBC: 4.4 10*3/uL (ref 4.0–10.5)

## 2021-10-04 LAB — H. PYLORI ANTIBODY, IGG: H Pylori IgG: NEGATIVE

## 2021-10-21 ENCOUNTER — Other Ambulatory Visit: Payer: Self-pay | Admitting: Family Medicine

## 2021-10-21 DIAGNOSIS — I10 Essential (primary) hypertension: Secondary | ICD-10-CM

## 2021-10-27 ENCOUNTER — Encounter: Payer: Self-pay | Admitting: Family Medicine

## 2021-10-27 ENCOUNTER — Ambulatory Visit (INDEPENDENT_AMBULATORY_CARE_PROVIDER_SITE_OTHER): Payer: 59 | Admitting: Family Medicine

## 2021-10-27 VITALS — BP 122/88 | HR 84 | Temp 98.4°F | Resp 18 | Ht 67.0 in | Wt 185.6 lb

## 2021-10-27 DIAGNOSIS — I10 Essential (primary) hypertension: Secondary | ICD-10-CM | POA: Diagnosis not present

## 2021-10-27 DIAGNOSIS — E785 Hyperlipidemia, unspecified: Secondary | ICD-10-CM

## 2021-10-27 DIAGNOSIS — M791 Myalgia, unspecified site: Secondary | ICD-10-CM

## 2021-10-27 LAB — POC URINALSYSI DIPSTICK (AUTOMATED)
Bilirubin, UA: NEGATIVE
Blood, UA: NEGATIVE
Glucose, UA: NEGATIVE
Ketones, UA: NEGATIVE
Leukocytes, UA: NEGATIVE
Nitrite, UA: NEGATIVE
Protein, UA: NEGATIVE
Spec Grav, UA: 1.01 (ref 1.010–1.025)
Urobilinogen, UA: 0.2 E.U./dL
pH, UA: 5 (ref 5.0–8.0)

## 2021-10-27 MED ORDER — LOVASTATIN 20 MG PO TABS: 20.0000 mg | ORAL_TABLET | Freq: Every day | ORAL | 3 refills | Status: DC

## 2021-10-27 MED ORDER — AMLODIPINE BESYLATE 2.5 MG PO TABS: 2.5000 mg | ORAL_TABLET | Freq: Every day | ORAL | 2 refills | Status: DC

## 2021-10-27 MED ORDER — ROSUVASTATIN CALCIUM 5 MG PO TABS: ORAL_TABLET | ORAL | 2 refills | Status: DC

## 2021-10-27 NOTE — Progress Notes (Signed)
Established Patient Office Visit  Subjective   Patient ID: Angela Garner, female    DOB: 01/07/1961  Age: 61 y.o. MRN: [id number]  Chief Complaint  Patient presents with   Abdominal Pain    Pt states still having fatigue, muscle aches/weakness and thinks a medication may be messing with her stomach    HPI Pt thinks the norvasc is causing the weakness.   She thinks she may have a uti ----  she is urinating a lot at night.  + dysuria    no d/c ----  Patient Active Problem List   Diagnosis Date Noted   Bronchitis 09/14/2021   Elevated CK 06/07/2021   Neck pain 06/07/2021   Controlled type 2 diabetes mellitus with chronic kidney disease, without long-term current use of insulin ([place]) 09/20/2020   Fatigue 09/20/2020   Palpitations 09/20/2020   Arm numbness 03/22/2020   Primary hypertension 03/22/2020   Vitamin D deficiency 03/22/2020   Hyperlipidemia associated with type 2 diabetes mellitus ([place]) 10/19/2019   Bilateral lower extremity edema 10/19/2019   Uncontrolled type 2 diabetes mellitus with hyperglycemia ([place]) 01/30/2019   Hyperlipidemia    Chronic venous insufficiency 01/13/2019   Varicose 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 11/11/2018   Chronic pain of right knee 11/11/2018   Urinary incontinence 11/11/2018   Greater trochanteric bursitis of left hip 05/09/2017   Acute left-sided low back pain without sciatica 05/09/2017   Abdominal pain 05/07/2017   Acute left-sided low back pain with left-sided sciatica 01/14/2015   CTS (carpal tunnel syndrome) 03/17/2013   Hyperglycemia 07/17/2012   Urinary frequency 07/17/2012   Lymphocytosis 11/06/2011   MURMUR 11/20/2007   Essential hypertension 11/05/2007   SINUSITIS- ACUTE-NOS 04/02/2007   GASTROENTERITIS 02/28/2007   GAS/BLOATING 02/28/2007   ABDOMINAL PAIN, EPIGASTRIC 02/28/2007   KNEE PAIN 01/30/2007   HYPERTENSION, BENIGN ESSENTIAL 10/31/2006   BACK PAIN 10/31/2006   ALLERGIC RHINITIS 05/15/2006   Past Medical  History:  Diagnosis Date   Allergy    Diabetes mellitus without complication (HCC)    no meds, only check bs   Heart murmur    Hyperlipidemia    Hypertension    Past Surgical History:  Procedure Laterality Date   UTERINE FIBROID SURGERY     Social History   Tobacco Use   Smoking status: Never   Smokeless tobacco: Never  Vaping Use   Vaping Use: Never used  Substance Use Topics   Alcohol use: No   Drug use: No   Social History   Socioeconomic History   Marital status: Divorced    Spouse name: Not on file   Number of children: Not on file   Years of education: Not on file   Highest education level: Not on file  Occupational History   Occupation: [employer]     Comment: [employer]  Tobacco Use   Smoking status: Never   Smokeless tobacco: Never  Vaping Use   Vaping Use: Never used  Substance and Sexual Activity   Alcohol use: No   Drug use: No   Sexual activity: Yes  Other Topics Concern   Not on file  Social History Narrative   Not on file   Social Determinants of Health   Financial Resource Strain: Not on file  Food Insecurity: Not on file  Transportation Needs: Not on file  Physical Activity: Not on file  Stress: Not on file  Social Connections: Not on file  Intimate Partner Violence: Not on file  Family Status  Relation Name Status   Mother  Deceased   Father  Deceased at age 68       MI   Sister  45   Sister  Deceased at age 62   Brother  15   Brother  Alive   Brother  Alive   Brother  Alive   Neg Hx  (Not Specified)   Family History  Problem Relation Age of Onset   Dementia Mother    Hypertension Father    Diabetes Father    Heart attack Father    Diabetes Sister        she had stomach pain-- cause unknown   Clotting disorder Brother    Colon cancer Neg Hx    Esophageal cancer Neg Hx    Stomach cancer Neg Hx    Rectal cancer Neg Hx    Allergies  Allergen Reactions   Metformin And Related    Pravachol  [Pravastatin Sodium] Other (See Comments)    Muscle aches   Simvastatin     "leg pains"      Review of Systems  Constitutional:  Negative for fever and malaise/fatigue.  HENT:  Negative for congestion.   Eyes:  Negative for blurred vision.  Respiratory:  Negative for shortness of breath.   Cardiovascular:  Negative for chest pain, palpitations and leg swelling.  Gastrointestinal:  Positive for abdominal pain. Negative for blood in stool and nausea.  Genitourinary:  Negative for dysuria and frequency.  Musculoskeletal:  Negative for falls.  Skin:  Negative for rash.  Neurological:  Negative for dizziness, loss of consciousness and headaches.  Endo/Heme/Allergies:  Negative for environmental allergies.  Psychiatric/Behavioral:  Negative for depression. The patient is not nervous/anxious.       Objective:     BP 122/88 (BP Location: Left Arm, Patient Position: Sitting, Cuff Size: Normal)   Pulse 84   Temp 98.4 F (36.9 C) (Oral)   Resp 18   Ht '5\' 7"'$  (1.702 m)   Wt 185 lb 9.6 oz (84.2 kg)   SpO2 96%   BMI 29.07 kg/m  BP Readings from Last 3 Encounters:  10/27/21 122/88  10/03/21 (!) 124/94  09/14/21 (!) 146/100   Wt Readings from Last 3 Encounters:  10/27/21 185 lb 9.6 oz (84.2 kg)  10/03/21 185 lb 12.8 oz (84.3 kg)  09/14/21 186 lb 3.2 oz (84.5 kg)   SpO2 Readings from Last 3 Encounters:  10/27/21 96%  10/03/21 98%  09/14/21 96%      Physical Exam Vitals and nursing note reviewed.  Constitutional:      Appearance: She is well-developed.  HENT:     Head: Normocephalic and atraumatic.  Eyes:     Conjunctiva/sclera: Conjunctivae normal.  Neck:     Thyroid: No thyromegaly.     Vascular: No carotid bruit or JVD.  Cardiovascular:     Rate and Rhythm: Normal rate and regular rhythm.     Heart sounds: Normal heart sounds. No murmur heard. Pulmonary:     Effort: Pulmonary effort is normal. No respiratory distress.     Breath sounds: Normal breath sounds. No  wheezing or rales.  Chest:     Chest wall: No tenderness.  Abdominal:     Tenderness: There is no abdominal tenderness.  Musculoskeletal:     Cervical back: Normal range of motion and neck supple.  Neurological:     Mental Status: She is alert and oriented to person, place, and time.  Results for orders placed or performed in visit on 10/27/21  POCT Urinalysis Dipstick (Automated)  Result Value Ref Range   Color, UA urine    Clarity, UA clear    Glucose, UA Negative Negative   Bilirubin, UA Negative    Ketones, UA Negative    Spec Grav, UA 1.010 1.010 - 1.025   Blood, UA Negative    pH, UA 5.0 5.0 - 8.0   Protein, UA Negative Negative   Urobilinogen, UA 0.2 0.2 or 1.0 E.U./dL   Nitrite, UA Negative    Leukocytes, UA Negative Negative    Last CBC Lab Results  Component Value Date   WBC 4.4 10/03/2021   HGB 13.9 10/03/2021   HCT 41.5 10/03/2021   MCV 94.1 10/03/2021   MCH 32.3 01/30/2019   RDW 13.8 10/03/2021   PLT 190.0 70/96/2836   Last metabolic panel Lab Results  Component Value Date   GLUCOSE 115 (H) 10/03/2021   NA 136 10/03/2021   K 3.9 10/03/2021   CL 101 10/03/2021   CO2 27 10/03/2021   BUN 13 10/03/2021   CREATININE 0.78 10/03/2021   CALCIUM 10.1 10/03/2021   PROT 7.1 10/03/2021   ALBUMIN 4.3 10/03/2021   BILITOT 0.8 10/03/2021   ALKPHOS 62 10/03/2021   AST 18 10/03/2021   ALT 15 10/03/2021   Last lipids Lab Results  Component Value Date   CHOL 201 (H) 06/19/2021   HDL 76.00 06/19/2021   LDLCALC 111 (H) 06/19/2021   LDLDIRECT 120.9 08/02/2011   TRIG 68.0 06/19/2021   CHOLHDL 3 06/19/2021   Last hemoglobin A1c Lab Results  Component Value Date   HGBA1C 6.6 (H) 09/28/2021   Last thyroid functions Lab Results  Component Value Date   TSH 2.98 09/20/2020   T4TOTAL 7.5 01/30/2019   Last vitamin D Lab Results  Component Value Date   VD25OH 41.20 09/20/2020   Last vitamin B12 and Folate Lab Results  Component Value Date    VITAMINB12 637 09/20/2020   FOLATE >24.0 01/30/2019      The 10-year ASCVD risk score (Arnett DK, et al., 2019) is: 13.6%    Assessment & Plan:   Problem List Items Addressed This Visit       Unprioritized   Primary hypertension - Primary   Relevant Medications   amLODipine (NORVASC) 2.5 MG tablet   rosuvastatin (CRESTOR) 5 MG tablet   Hyperlipidemia   Relevant Medications   amLODipine (NORVASC) 2.5 MG tablet   rosuvastatin (CRESTOR) 5 MG tablet   Other Visit Diagnoses     Myalgia       Relevant Orders   POCT Urinalysis Dipstick (Automated) (Completed)   CK       No follow-ups on file.    [name], DO

## 2021-10-27 NOTE — Patient Instructions (Signed)
Hypertension, Adult High blood pressure (hypertension) is when the force of blood pumping through the arteries is too strong. The arteries are the blood vessels that carry blood from the heart throughout the body. Hypertension forces the heart to work harder to pump blood and may cause arteries to become narrow or stiff. Untreated or uncontrolled hypertension can lead to a heart attack, heart failure, a stroke, kidney disease, and other problems. A blood pressure reading consists of a higher number over a lower number. Ideally, your blood pressure should be below 120/80. The first ("top") number is called the systolic pressure. It is a measure of the pressure in your arteries as your heart beats. The second ("bottom") number is called the diastolic pressure. It is a measure of the pressure in your arteries as the heart relaxes. What are the causes? The exact cause of this condition is not known. There are some conditions that result in high blood pressure. What increases the risk? Certain factors may make you more likely to develop high blood pressure. Some of these risk factors are under your control, including: Smoking. Not getting enough exercise or physical activity. Being overweight. Having too much fat, sugar, calories, or salt (sodium) in your diet. Drinking too much alcohol. Other risk factors include: Having a personal history of heart disease, diabetes, high cholesterol, or kidney disease. Stress. Having a family history of high blood pressure and high cholesterol. Having obstructive sleep apnea. Age. The risk increases with age. What are the signs or symptoms? High blood pressure may not cause symptoms. Very high blood pressure (hypertensive crisis) may cause: Headache. Fast or irregular heartbeats (palpitations). Shortness of breath. Nosebleed. Nausea and vomiting. Vision changes. Severe chest pain, dizziness, and seizures. How is this diagnosed? This condition is diagnosed by  measuring your blood pressure while you are seated, with your arm resting on a flat surface, your legs uncrossed, and your feet flat on the floor. The cuff of the blood pressure monitor will be placed directly against the skin of your upper arm at the level of your heart. Blood pressure should be measured at least twice using the same arm. Certain conditions can cause a difference in blood pressure between your right and left arms. If you have a high blood pressure reading during one visit or you have normal blood pressure with other risk factors, you may be asked to: Return on a different day to have your blood pressure checked again. Monitor your blood pressure at home for 1 week or longer. If you are diagnosed with hypertension, you may have other blood or imaging tests to help your health care provider understand your overall risk for other conditions. How is this treated? This condition is treated by making healthy lifestyle changes, such as eating healthy foods, exercising more, and reducing your alcohol intake. You may be referred for counseling on a healthy diet and physical activity. Your health care provider may prescribe medicine if lifestyle changes are not enough to get your blood pressure under control and if: Your systolic blood pressure is above 130. Your diastolic blood pressure is above 80. Your personal target blood pressure may vary depending on your medical conditions, your age, and other factors. Follow these instructions at home: Eating and drinking  Eat a diet that is high in fiber and potassium, and low in sodium, added sugar, and fat. An example of this eating plan is called the DASH diet. DASH stands for Dietary Approaches to Stop Hypertension. To eat this way: Eat   plenty of fresh fruits and vegetables. Try to fill one half of your plate at each meal with fruits and vegetables. Eat whole grains, such as whole-wheat pasta, brown rice, or whole-grain bread. Fill about one  fourth of your plate with whole grains. Eat or drink low-fat dairy products, such as skim milk or low-fat yogurt. Avoid fatty cuts of meat, processed or cured meats, and poultry with skin. Fill about one fourth of your plate with lean proteins, such as fish, chicken without skin, beans, eggs, or tofu. Avoid pre-made and processed foods. These tend to be higher in sodium, added sugar, and fat. Reduce your daily sodium intake. Many people with hypertension should eat less than 1,500 mg of sodium a day. Do not drink alcohol if: Your health care provider tells you not to drink. You are pregnant, may be pregnant, or are planning to become pregnant. If you drink alcohol: Limit how much you have to: 0-1 drink a day for women. 0-2 drinks a day for men. Know how much alcohol is in your drink. In the [place] one drink equals one 12 oz bottle of beer (355 mL), one 5 oz glass of wine (148 mL), or one 1 oz glass of hard liquor (44 mL). Lifestyle  Work with your health care provider to maintain a healthy body weight or to lose weight. Ask what an ideal weight is for you. Get at least 30 minutes of exercise that causes your heart to beat faster (aerobic exercise) most days of the week. Activities may include walking, swimming, or biking. Include exercise to strengthen your muscles (resistance exercise), such as Pilates or lifting weights, as part of your weekly exercise routine. Try to do these types of exercises for 30 minutes at least 3 days a week. Do not use any products that contain nicotine or tobacco. These products include cigarettes, chewing tobacco, and vaping devices, such as e-cigarettes. If you need help quitting, ask your health care provider. Monitor your blood pressure at home as told by your health care provider. Keep all follow-up visits. This is important. Medicines Take over-the-counter and prescription medicines only as told by your health care provider. Follow directions carefully. Blood  pressure medicines must be taken as prescribed. Do not skip doses of blood pressure medicine. Doing this puts you at risk for problems and can make the medicine less effective. Ask your health care provider about side effects or reactions to medicines that you should watch for. Contact a health care provider if you: Think you are having a reaction to a medicine you are taking. Have headaches that keep coming back (recurring). Feel dizzy. Have swelling in your ankles. Have trouble with your vision. Get help right away if you: Develop a severe headache or confusion. Have unusual weakness or numbness. Feel faint. Have severe pain in your chest or abdomen. Vomit repeatedly. Have trouble breathing. These symptoms may be an emergency. Get help right away. Call 911. Do not wait to see if the symptoms will go away. Do not drive yourself to the hospital. Summary Hypertension is when the force of blood pumping through your arteries is too strong. If this condition is not controlled, it may put you at risk for serious complications. Your personal target blood pressure may vary depending on your medical conditions, your age, and other factors. For most people, a normal blood pressure is less than 120/80. Hypertension is treated with lifestyle changes, medicines, or a combination of both. Lifestyle changes include losing weight, eating a healthy,   low-sodium diet, exercising more, and limiting alcohol. This information is not intended to replace advice given to you by your health care provider. Make sure you discuss any questions you have with your health care provider. Document Revised: 02/14/2021 Document Reviewed: 02/14/2021 Elsevier Patient Education  2023 Elsevier Inc.  

## 2021-10-27 NOTE — Assessment & Plan Note (Signed)
Dec norvasc to 2.5 mg daily  See if side effects improve

## 2021-11-22 ENCOUNTER — Ambulatory Visit: Payer: 59 | Admitting: Cardiology

## 2021-11-29 DIAGNOSIS — T7840XA Allergy, unspecified, initial encounter: Secondary | ICD-10-CM | POA: Insufficient documentation

## 2021-11-29 DIAGNOSIS — E119 Type 2 diabetes mellitus without complications: Secondary | ICD-10-CM | POA: Insufficient documentation

## 2021-11-29 NOTE — Progress Notes (Unsigned)
Cardiology Office Note:    Date:  11/29/2021   ID:  Angela Garner, DOB 02-25-1961, MRN [id number]  PCP:  [name], DO  Cardiologist:  Shirlee More, MD   Referring MD: Carollee Herter, Alferd Apa, *  ASSESSMENT:    No diagnosis found. PLAN:    In order of problems listed above:  ***  Next appointment   Medication Adjustments/Labs and Tests Ordered: Current medicines are reviewed at length with the patient today.  Concerns regarding medicines are outlined above.  No orders of the defined types were placed in this encounter.  No orders of the defined types were placed in this encounter.    No chief complaint on file. ***  History of Present Illness:    Angela Garner is a 61 y.o. female with hypertension type 2 diabetes and hyper lipidemia who is being seen today for the evaluation of palpitation at the request of Carollee Herter, Alferd Apa, *.  She had an EKG 09/20/2020 showing sinus rhythm and normal. She had an echocardiogram 10/31/2020 showing an ejection fraction 60 to 65% and grade 1 diastolic dysfunction normal right ventricular size and function mild mitral regurgitation was noted. Past Medical History:  Diagnosis Date   Abdominal pain 05/07/2017   Abdominal pain, epigastric 02/28/2007   Qualifier: Diagnosis of  By: Cletus Gash MD, [place]     Acute left-sided low back pain with left-sided sciatica 01/14/2015   Acute left-sided low back pain without sciatica 05/09/2017   ALLERGIC RHINITIS 05/15/2006   Qualifier: Diagnosis of  By: Allen Norris     Allergy    Arm numbness 03/22/2020   BACK PAIN 10/31/2006   Qualifier: Diagnosis of  By: Ronnald Ramp CMA, [place]     Bilateral lower extremity edema 10/19/2019   Bronchitis 09/14/2021   Chronic pain of right knee 11/11/2018   Chronic venous insufficiency 01/13/2019   Controlled type 2 diabetes mellitus with chronic kidney disease, without long-term current use of insulin ([place]) 09/20/2020   CTS (carpal tunnel syndrome) 03/17/2013    Diabetes mellitus without complication ([place])    no meds, only check bs   Elevated CK 06/07/2021   Essential hypertension 11/05/2007   Qualifier: Diagnosis of  By: Jerold Coombe     Fatigue 09/20/2020   GAS/BLOATING 02/28/2007   Qualifier: Diagnosis of  By: Cletus Gash MD, [place] 02/28/2007   Qualifier: Diagnosis of  By: Cletus Gash MD, [place]     Greater trochanteric bursitis of left hip 05/09/2017   Hyperglycemia 07/17/2012   Hyperlipidemia    Hyperlipidemia associated with type 2 diabetes mellitus ([place]) 10/19/2019   HYPERTENSION, BENIGN ESSENTIAL 10/31/2006   Qualifier: Diagnosis of  By: Ronnald Ramp CMA, [place]     KNEE PAIN 01/30/2007   Qualifier: Diagnosis of  By: Cletus Gash MD, [place]     Lymphocytosis 11/06/2011   MURMUR 11/20/2007   Qualifier: Diagnosis of  By: Jerold Coombe     Neck pain 06/07/2021   Palpitations 09/20/2020   Primary hypertension 03/22/2020   SINUSITIS- ACUTE-NOS 04/02/2007   Qualifier: Diagnosis of  By: Cletus Gash MD, [place]     Uncontrolled type 2 diabetes mellitus with hyperglycemia ([place]) 01/30/2019   Urinary frequency 07/17/2012   Urinary incontinence 11/11/2018   Varicose 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 11/11/2018   Vitamin D deficiency 03/22/2020    Past Surgical History:  Procedure Laterality Date   UTERINE FIBROID SURGERY      Current Medications: No outpatient medications have  been marked as taking for the 11/30/21 encounter (Appointment) with Richardo Priest, MD.     Allergies:   Metformin and related, Pravachol [pravastatin sodium], and Simvastatin   Social History   Socioeconomic History   Marital status: Divorced    Spouse name: Not on file   Number of children: Not on file   Years of education: Not on file   Highest education level: Not on file  Occupational History   Occupation: [employer]     Comment: [employer]  Tobacco Use   Smoking status: Never   Smokeless tobacco: Never  Vaping Use   Vaping Use: Never  used  Substance and Sexual Activity   Alcohol use: No   Drug use: No   Sexual activity: Yes  Other Topics Concern   Not on file  Social History Narrative   Not on file   Social Determinants of Health   Financial Resource Strain: Not on file  Food Insecurity: Not on file  Transportation Needs: Not on file  Physical Activity: Not on file  Stress: Not on file  Social Connections: Not on file     Family History: The patient's ***family history includes Clotting disorder in her brother; Dementia in her mother; Diabetes in her father and sister; Heart attack in her father; Hypertension in her father. There is no history of Colon cancer, Esophageal cancer, Stomach cancer, or Rectal cancer.  ROS:   ROS Please see the history of present illness.    *** All other systems reviewed and are negative.  EKGs/Labs/Other Studies Reviewed:    The following studies were reviewed today: ***  EKG:  EKG is *** ordered today.  The ekg ordered today is personally reviewed and demonstrates ***  Recent Labs: 10/03/2021: ALT 15; BUN 13; Creatinine, Ser 0.78; Hemoglobin 13.9; Platelets 190.0; Potassium 3.9; Sodium 136  Recent Lipid Panel    Component Value Date/Time   CHOL 201 (H) 06/19/2021 1143   TRIG 68.0 06/19/2021 1143   HDL 76.00 06/19/2021 1143   CHOLHDL 3 06/19/2021 1143   VLDL 13.6 06/19/2021 1143   LDLCALC 111 (H) 06/19/2021 1143   LDLDIRECT 120.9 08/02/2011 1132    Physical Exam:    VS:  There were no vitals taken for this visit.    Wt Readings from Last 3 Encounters:  10/27/21 185 lb 9.6 oz (84.2 kg)  10/03/21 185 lb 12.8 oz (84.3 kg)  09/14/21 186 lb 3.2 oz (84.5 kg)     GEN: *** Well nourished, well developed in no acute distress HEENT: Normal NECK: No JVD; No carotid bruits LYMPHATICS: No lymphadenopathy CARDIAC: ***RRR, no murmurs, rubs, gallops RESPIRATORY:  Clear to auscultation without rales, wheezing or rhonchi  ABDOMEN: Soft, non-tender,  non-distended MUSCULOSKELETAL:  No edema; No deformity  SKIN: Warm and dry NEUROLOGIC:  Alert and oriented x 3 PSYCHIATRIC:  Normal affect     Signed, Shirlee More, MD  11/29/2021 5:19 PM    [facility]

## 2021-11-30 ENCOUNTER — Ambulatory Visit: Payer: 59 | Admitting: Cardiology

## 2021-11-30 ENCOUNTER — Encounter: Payer: Self-pay | Admitting: Cardiology

## 2021-11-30 VITALS — BP 120/78 | HR 60 | Ht 68.0 in | Wt 186.0 lb

## 2021-11-30 DIAGNOSIS — I10 Essential (primary) hypertension: Secondary | ICD-10-CM

## 2021-11-30 DIAGNOSIS — R002 Palpitations: Secondary | ICD-10-CM

## 2021-11-30 DIAGNOSIS — E785 Hyperlipidemia, unspecified: Secondary | ICD-10-CM | POA: Diagnosis not present

## 2021-11-30 DIAGNOSIS — I4510 Unspecified right bundle-branch block: Secondary | ICD-10-CM

## 2021-11-30 NOTE — Patient Instructions (Addendum)
Medication Instructions:  Your physician recommends that you continue on your current medications as directed. Please refer to the Current Medication list given to you today.  *If you need a refill on your cardiac medications before your next appointment, please call your pharmacy*   Lab Work: None If you have labs (blood work) drawn today and your tests are completely normal, you will receive your results only by: [place] (if you have MyChart) OR A paper copy in the mail If you have any lab test that is abnormal or we need to change your treatment, we will call you to review the results.   Testing/Procedures: None   Follow-Up: At [facility], you and your health needs are our priority.  As part of our continuing mission to provide you with exceptional heart care, we have created designated Provider Care Teams.  These Care Teams include your primary Cardiologist (physician) and Advanced Practice Providers (APPs -  Physician Assistants and Nurse Practitioners) who all work together to provide you with the care you need, when you need it.  We recommend signing up for the patient portal called "MyChart".  Sign up information is provided on this After Visit Summary.  MyChart is used to connect with patients for Virtual Visits (Telemedicine).  Patients are able to view lab/test results, encounter notes, upcoming appointments, etc.  Non-urgent messages can be sent to your provider as well.   To learn more about what you can do with MyChart, go to [email or web address].    Your next appointment:   6 month(s)  The format for your next appointment:   In Person  Provider:   Dr. Berniece Salines, MD {   Other Instructions Purchase a Mobile [place]  Important Information About Sugar            1. Avoid all over-the-counter antihistamines except Claritin/Loratadine and Zyrtec/Cetrizine. 2. Avoid all combination including cold sinus allergies flu decongestant and sleep  medications 3. You can use Robitussin DM Mucinex and Mucinex DM for cough. 4. can use Tylenol aspirin ibuprofen and naproxen but no combinations such as sleep or sinus.

## 2022-01-04 ENCOUNTER — Telehealth: Payer: Self-pay | Admitting: Family Medicine

## 2022-01-04 NOTE — Telephone Encounter (Signed)
Patient called to advise that insurance denied medication and that It requires a PA. Insurance gave her number to call. [phone]. Please call patient to advise.

## 2022-01-04 NOTE — Telephone Encounter (Signed)
Medication is Rybelsus

## 2022-01-05 ENCOUNTER — Telehealth: Payer: Self-pay

## 2022-01-05 NOTE — Telephone Encounter (Signed)
PA denied. Preferred alternatives: Trulicity; Victoza

## 2022-01-05 NOTE — Telephone Encounter (Signed)
PA initiated via Covermymeds; KEY: [id number]. Awaiting determination.

## 2022-01-05 NOTE — Telephone Encounter (Signed)
Already started. See other telephone note

## 2022-01-09 ENCOUNTER — Other Ambulatory Visit: Payer: Self-pay

## 2022-01-09 MED ORDER — LIRAGLUTIDE 18 MG/3ML ~~LOC~~ SOPN: PEN_INJECTOR | SUBCUTANEOUS | 3 refills | Status: DC

## 2022-01-09 NOTE — Telephone Encounter (Signed)
Spoke with patient. Rx sent in. Pt was offered a nurse visit to show patient how to giver herself a shot but she she declined at this time.

## 2022-01-11 ENCOUNTER — Telehealth: Payer: Self-pay | Admitting: Family Medicine

## 2022-01-11 NOTE — Telephone Encounter (Signed)
Patient said insurance is still denying the Victoza and she thinks maybe it's because it's 3 months worth.   Also, she will be traveling to [place] next month and wants to know if she can be given anti malaria medication or something similar. Please call to advise.

## 2022-01-12 NOTE — Telephone Encounter (Signed)
Pt was advised to contact infectious diseases regarding the Malaria meds.Pt states insurance is denying the Victoza

## 2022-01-14 ENCOUNTER — Other Ambulatory Visit: Payer: Self-pay | Admitting: Family Medicine

## 2022-01-14 DIAGNOSIS — I10 Essential (primary) hypertension: Secondary | ICD-10-CM

## 2022-01-15 NOTE — Telephone Encounter (Signed)
Patient calling to follow up on denied prescriptions. Patient said pharmacy told her medication was rejected. She is down to her last pill. Patient is not sure what to do next. Please call her to advise.

## 2022-01-17 ENCOUNTER — Other Ambulatory Visit: Payer: Self-pay

## 2022-01-17 ENCOUNTER — Other Ambulatory Visit: Payer: Self-pay | Admitting: Family Medicine

## 2022-01-17 DIAGNOSIS — E1165 Type 2 diabetes mellitus with hyperglycemia: Secondary | ICD-10-CM

## 2022-01-17 MED ORDER — TRULICITY 0.75 MG/0.5ML ~~LOC~~ SOAJ: 0.7500 mg | SUBCUTANEOUS | 1 refills | Status: DC

## 2022-01-17 NOTE — Telephone Encounter (Signed)
Spoke with patient. Rybelsus and Victoza was not covered but Trulicity is. Pt also coming in tomorrow for blood pressure concerns

## 2022-01-17 NOTE — Telephone Encounter (Signed)
Patient called to advise that medication still denied. [employer] told her that Trulicity is pre approved and needs prior authorization. Patient provided number to call [phone].She is totally out of medicine. Please call to advise patient.

## 2022-01-18 ENCOUNTER — Telehealth: Payer: Self-pay

## 2022-01-18 ENCOUNTER — Encounter: Payer: Self-pay | Admitting: Family Medicine

## 2022-01-18 ENCOUNTER — Ambulatory Visit (INDEPENDENT_AMBULATORY_CARE_PROVIDER_SITE_OTHER): Payer: 59 | Admitting: Family Medicine

## 2022-01-18 VITALS — BP 148/100 | HR 66 | Temp 98.4°F | Resp 18 | Ht 68.0 in | Wt 185.6 lb

## 2022-01-18 DIAGNOSIS — E1165 Type 2 diabetes mellitus with hyperglycemia: Secondary | ICD-10-CM

## 2022-01-18 DIAGNOSIS — Z23 Encounter for immunization: Secondary | ICD-10-CM

## 2022-01-18 DIAGNOSIS — E785 Hyperlipidemia, unspecified: Secondary | ICD-10-CM | POA: Diagnosis not present

## 2022-01-18 DIAGNOSIS — I10 Essential (primary) hypertension: Secondary | ICD-10-CM

## 2022-01-18 DIAGNOSIS — Z7184 Encounter for health counseling related to travel: Secondary | ICD-10-CM | POA: Diagnosis not present

## 2022-01-18 DIAGNOSIS — E1169 Type 2 diabetes mellitus with other specified complication: Secondary | ICD-10-CM

## 2022-01-18 DIAGNOSIS — Z0000 Encounter for general adult medical examination without abnormal findings: Secondary | ICD-10-CM | POA: Insufficient documentation

## 2022-01-18 MED ORDER — BLOOD GLUCOSE MONITOR KIT: PACK | 0 refills | Status: AC

## 2022-01-18 MED ORDER — OLMESARTAN MEDOXOMIL 40 MG PO TABS: 40.0000 mg | ORAL_TABLET | Freq: Every day | ORAL | 1 refills | Status: DC

## 2022-01-18 MED ORDER — OLMESARTAN MEDOXOMIL-HCTZ 40-25 MG PO TABS: 1.0000 | ORAL_TABLET | Freq: Every day | ORAL | 3 refills | Status: DC

## 2022-01-18 MED ORDER — AMLODIPINE BESYLATE 5 MG PO TABS: 5.0000 mg | ORAL_TABLET | Freq: Every day | ORAL | 1 refills | Status: DC

## 2022-01-18 NOTE — Assessment & Plan Note (Signed)
Poorly controlled will alter medications, encouraged DASH diet, minimize caffeine and obtain adequate sleep. Report concerning symptoms and follow up as directed and as needed Increase norvasc to 5 mg daily F/u 2-3 weeks for bp check or sooner prn

## 2022-01-18 NOTE — Assessment & Plan Note (Signed)
Tolerating statin, encouraged heart healthy diet, avoid trans fats, minimize simple carbs and saturated fats. Increase exercise as tolerated 

## 2022-01-18 NOTE — Progress Notes (Signed)
Subjective:   By signing my name below, I, Angela Garner, attest that this documentation has been prepared under the direction and in the presence of Angela Garner 01/18/2022   Patient ID: Angela Garner, female    DOB: [birth year], 61 y.o.   MRN: [id number]  Chief Complaint  Patient presents with   Hypertension   Follow-up    HPI Patient is in today for an office visit  She is requesting a prescription of blood glucose meter kit  She states that her blood pressure is increasing which she believes could be due to stress or her back pain. She reports associated symptoms of headaches. She takes One a Day tablet Multivitamin for Women and occasionally Tylenol. She is currently taking 2.5 Mg of Amlodipine and 40 Mg of Benicar. She is also consuming celery and maintaining a healthy diet. She wants to control her blood pressure before she has to travel soon.  BP Readings from Last 3 Encounters:  01/18/22 (!) 148/100  11/30/21 120/78  10/27/21 122/88   Pulse Readings from Last 3 Encounters:  01/18/22 66  11/30/21 60  10/27/21 84   She is frequently peeing due to her incontinence. She is not taking a diuretic at the moment  She is interested in receiving an influenza vaccine during today's visit.   Past Medical History:  Diagnosis Date   Abdominal pain 05/07/2017   Abdominal pain, epigastric 02/28/2007   Qualifier: Diagnosis of  By: Cletus Gash MD, [place]     Acute left-sided low back pain with left-sided sciatica 01/14/2015   Acute left-sided low back pain without sciatica 05/09/2017   ALLERGIC RHINITIS 05/15/2006   Qualifier: Diagnosis of  By: Allen Norris     Allergy    Arm numbness 03/22/2020   BACK PAIN 10/31/2006   Qualifier: Diagnosis of  By: Ronnald Ramp CMA, [place]     Bilateral lower extremity edema 10/19/2019   Bronchitis 09/14/2021   Chronic pain of right knee 11/11/2018   Chronic venous insufficiency 01/13/2019   Controlled type 2 diabetes mellitus with chronic kidney  disease, without long-term current use of insulin ([place]) 09/20/2020   CTS (carpal tunnel syndrome) 03/17/2013   Diabetes mellitus without complication ([place])    no meds, only check bs   Elevated CK 06/07/2021   Essential hypertension 11/05/2007   Qualifier: Diagnosis of  By: Jerold Coombe     Fatigue 09/20/2020   GAS/BLOATING 02/28/2007   Qualifier: Diagnosis of  By: Cletus Gash MD, [place] 02/28/2007   Qualifier: Diagnosis of  By: Cletus Gash MD, [place]     Greater trochanteric bursitis of left hip 05/09/2017   Hyperglycemia 07/17/2012   Hyperlipidemia    Hyperlipidemia associated with type 2 diabetes mellitus ([place]) 10/19/2019   HYPERTENSION, BENIGN ESSENTIAL 10/31/2006   Qualifier: Diagnosis of  By: Ronnald Ramp CMA, [place]     KNEE PAIN 01/30/2007   Qualifier: Diagnosis of  By: Cletus Gash MD, [place]     Lymphocytosis 11/06/2011   MURMUR 11/20/2007   Qualifier: Diagnosis of  By: Jerold Coombe     Neck pain 06/07/2021   Palpitations 09/20/2020   Primary hypertension 03/22/2020   SINUSITIS- ACUTE-NOS 04/02/2007   Qualifier: Diagnosis of  By: Cletus Gash MD, [place]     Uncontrolled type 2 diabetes mellitus with hyperglycemia ([place]) 01/30/2019   Urinary frequency 07/17/2012   Urinary incontinence 11/11/2018   Varicose 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 11/11/2018   Vitamin D deficiency 03/22/2020  Past Surgical History:  Procedure Laterality Date   UTERINE FIBROID SURGERY      Family History  Problem Relation Age of Onset   Dementia Mother    Hypertension Father    Diabetes Father    Heart attack Father    Diabetes Sister        she had stomach pain-- cause unknown   Clotting disorder Brother    Colon cancer Neg Hx    Esophageal cancer Neg Hx    Stomach cancer Neg Hx    Rectal cancer Neg Hx     Social History   Socioeconomic History   Marital status: Divorced    Spouse name: Not on file   Number of children: Not on file   Years of education: Not on file   Highest  education level: Not on file  Occupational History   Occupation: [employer]     Comment: [employer]  Tobacco Use   Smoking status: Never   Smokeless tobacco: Never  Vaping Use   Vaping Use: Never used  Substance and Sexual Activity   Alcohol use: No   Drug use: No   Sexual activity: Yes  Other Topics Concern   Not on file  Social History Narrative   Not on file   Social Determinants of Health   Financial Resource Strain: Not on file  Food Insecurity: Not on file  Transportation Needs: Not on file  Physical Activity: Not on file  Stress: Not on file  Social Connections: Not on file  Intimate Partner Violence: Not on file    Outpatient Medications Prior to Visit  Medication Sig Dispense Refill   Dulaglutide (TRULICITY) 4.16 SA/6.3KZ SOPN Inject 0.75 mg into the skin once a week. 2 mL 1   famotidine (PEPCID) 20 MG tablet Take 1 tablet (20 mg total) by mouth 2 (two) times daily. (Patient taking differently: Take 20 mg by mouth as needed for heartburn or indigestion.) 60 tablet 5   fluticasone (FLONASE) 50 MCG/ACT nasal spray Place 2 sprays into both nostrils daily. (Patient taking differently: Place 2 sprays into both nostrils as needed for allergies or rhinitis.) 16 g 6   loratadine (CLARITIN) 10 MG tablet Take 1 tablet (10 mg total) by mouth daily. (Patient taking differently: Take 10 mg by mouth as needed for allergies or rhinitis.) 30 tablet 11   multivitamin-iron-minerals-folic acid (CENTRUM) chewable tablet Chew 1 tablet by mouth daily.     NONFORMULARY OR COMPOUNDED ITEM Compression stocking -- thigh high  20-30 mm/hg #1  Dx varicose veint 1 each 0   rosuvastatin (CRESTOR) 5 MG tablet Take 1 tablet by mouth , MWF 90 tablet 2   amLODipine (NORVASC) 2.5 MG tablet Take 1 tablet by mouth once daily 30 tablet 0   olmesartan (BENICAR) 40 MG tablet Take 1 tablet (40 mg total) by mouth daily. 90 tablet 1   No facility-administered medications prior to visit.     Allergies  Allergen Reactions   Metformin And Related    Pravachol [Pravastatin Sodium] Other (See Comments)    Muscle aches   Simvastatin     "leg pains"    Review of Systems  Constitutional:  Negative for fever and malaise/fatigue.  HENT:  Negative for congestion.   Eyes:  Negative for blurred vision.  Respiratory:  Negative for shortness of breath.   Cardiovascular:  Negative for chest pain, palpitations and leg swelling.  Gastrointestinal:  Negative for abdominal pain, blood in stool and nausea.  Genitourinary:  Negative for dysuria and frequency.  Musculoskeletal:  Negative for falls.  Skin:  Negative for rash.  Neurological:  Positive for headaches. Negative for dizziness and loss of consciousness.  Endo/Heme/Allergies:  Negative for environmental allergies.  Psychiatric/Behavioral:  Negative for depression. The patient is not nervous/anxious.        Objective:    Physical Exam Vitals and nursing note reviewed.  Constitutional:      General: She is not in acute distress.    Appearance: Normal appearance. She is not ill-appearing.  HENT:     Head: Normocephalic and atraumatic.     Right Ear: External ear normal.     Left Ear: External ear normal.  Eyes:     Extraocular Movements: Extraocular movements intact.     Pupils: Pupils are equal, round, and reactive to light.  Cardiovascular:     Rate and Rhythm: Normal rate and regular rhythm.     Heart sounds: Normal heart sounds. No murmur heard.    No gallop.  Pulmonary:     Effort: Pulmonary effort is normal. No respiratory distress.     Breath sounds: Normal breath sounds. No wheezing or rales.  Skin:    General: Skin is warm and dry.  Neurological:     Mental Status: She is alert and oriented to person, place, and time.  Psychiatric:        Judgment: Judgment normal.     BP (!) 148/100 (BP Location: Right Arm, Patient Position: Sitting, Cuff Size: Normal)   Pulse 66   Temp 98.4 F (36.9 C) (Oral)    Resp 18   Ht '5\' 8"'  (1.727 m)   Wt 185 lb 9.6 oz (84.2 kg)   SpO2 99%   BMI 28.22 kg/m  Wt Readings from Last 3 Encounters:  01/18/22 185 lb 9.6 oz (84.2 kg)  11/30/21 186 lb 0.6 oz (84.4 kg)  10/27/21 185 lb 9.6 oz (84.2 kg)    Diabetic Foot Exam - Simple   No data filed    Lab Results  Component Value Date   WBC 4.4 10/03/2021   HGB 13.9 10/03/2021   HCT 41.5 10/03/2021   PLT 190.0 10/03/2021   GLUCOSE 115 (H) 10/03/2021   CHOL 201 (H) 06/19/2021   TRIG 68.0 06/19/2021   HDL 76.00 06/19/2021   LDLDIRECT 120.9 08/02/2011   LDLCALC 111 (H) 06/19/2021   ALT 15 10/03/2021   AST 18 10/03/2021   NA 136 10/03/2021   K 3.9 10/03/2021   CL 101 10/03/2021   CREATININE 0.78 10/03/2021   BUN 13 10/03/2021   CO2 27 10/03/2021   TSH 2.98 09/20/2020   HGBA1C 6.6 (H) 09/28/2021   MICROALBUR 3.1 (H) 06/19/2021    Lab Results  Component Value Date   TSH 2.98 09/20/2020   Lab Results  Component Value Date   WBC 4.4 10/03/2021   HGB 13.9 10/03/2021   HCT 41.5 10/03/2021   MCV 94.1 10/03/2021   PLT 190.0 10/03/2021   Lab Results  Component Value Date   NA 136 10/03/2021   K 3.9 10/03/2021   CO2 27 10/03/2021   GLUCOSE 115 (H) 10/03/2021   BUN 13 10/03/2021   CREATININE 0.78 10/03/2021   BILITOT 0.8 10/03/2021   ALKPHOS 62 10/03/2021   AST 18 10/03/2021   ALT 15 10/03/2021   PROT 7.1 10/03/2021   ALBUMIN 4.3 10/03/2021   CALCIUM 10.1 10/03/2021   GFR 81.64 10/03/2021   Lab Results  Component Value Date   CHOL  201 (H) 06/19/2021   Lab Results  Component Value Date   HDL 76.00 06/19/2021   Lab Results  Component Value Date   LDLCALC 111 (H) 06/19/2021   Lab Results  Component Value Date   TRIG 68.0 06/19/2021   Lab Results  Component Value Date   CHOLHDL 3 06/19/2021   Lab Results  Component Value Date   HGBA1C 6.6 (H) 09/28/2021       Assessment & Plan:   Problem List Items Addressed This Visit       Unprioritized   Hyperlipidemia    Relevant Medications   amLODipine (NORVASC) 5 MG tablet   olmesartan (BENICAR) 40 MG tablet   Other Relevant Orders   Lipid panel   Comprehensive metabolic panel   Uncontrolled type 2 diabetes mellitus with hyperglycemia ([place])    trulicity added Recheck labs in 3 months Lab Results  Component Value Date   HGBA1C 6.6 (H) 09/28/2021         Relevant Medications   olmesartan (BENICAR) 40 MG tablet   blood glucose meter kit and supplies KIT   Travel advice encounter   Relevant Orders   Ambulatory referral to Infectious Disease   Primary hypertension - Primary    Poorly controlled will alter medications, encouraged DASH diet, minimize caffeine and obtain adequate sleep. Report concerning symptoms and follow up as directed and as needed Increase norvasc to 5 mg daily F/u 2-3 weeks for bp check or sooner prn       Relevant Medications   amLODipine (NORVASC) 5 MG tablet   olmesartan (BENICAR) 40 MG tablet   Other Relevant Orders   Lipid panel   CBC with Differential/Platelet   Comprehensive metabolic panel   HYPERTENSION, BENIGN ESSENTIAL    Poorly controlled will alter medications, encouraged DASH diet, minimize caffeine and obtain adequate sleep. Report concerning symptoms and follow up as directed and as needed      Relevant Medications   amLODipine (NORVASC) 5 MG tablet   olmesartan (BENICAR) 40 MG tablet   Hyperlipidemia associated with type 2 diabetes mellitus ([place])    Tolerating statin, encouraged heart healthy diet, avoid trans fats, minimize simple carbs and saturated fats. Increase exercise as tolerated      Relevant Medications   amLODipine (NORVASC) 5 MG tablet   olmesartan (BENICAR) 40 MG tablet   Other Visit Diagnoses     Need for influenza vaccination       Relevant Orders   Flu Vaccine QUAD 66moIM (Fluarix, Fluzone & Alfiuria Quad PF) (Completed)       Meds ordered this encounter  Medications   amLODipine (NORVASC) 5 MG tablet    Sig: Take 1  tablet (5 mg total) by mouth daily.    Dispense:  90 tablet    Refill:  1   DISCONTD: olmesartan-hydrochlorothiazide (BENICAR HCT) 40-25 MG tablet    Sig: Take 1 tablet by mouth daily.    Dispense:  90 tablet    Refill:  3   olmesartan (BENICAR) 40 MG tablet    Sig: Take 1 tablet (40 mg total) by mouth daily.    Dispense:  90 tablet    Refill:  1   blood glucose meter kit and supplies KIT    Sig: Check glucose daily    Dispense:  1 each    Refill:  0    Order Specific Question:   Number of strips    Answer:   100    Order Specific Question:  Number of lancets    Answer:   100    I, [place], personally preformed the services described in this documentation.  All medical record entries made by the scribe were at my direction and in my presence.  I have reviewed the chart and discharge instructions (if applicable) and agree that the record reflects my personal performance and is accurate and complete.01/18/2022   I,Amber Collins,acting as a scribe for Angela Held, Garner.,have documented all relevant documentation on the behalf of Angela Held, Garner,as directed by  Angela Held, Garner while in the presence of Angela Held, Garner.    Angela Held, Garner

## 2022-01-18 NOTE — Assessment & Plan Note (Signed)
Poorly controlled will alter medications, encouraged DASH diet, minimize caffeine and obtain adequate sleep. Report concerning symptoms and follow up as directed and as needed 

## 2022-01-18 NOTE — Assessment & Plan Note (Signed)
trulicity added Recheck labs in 3 months Lab Results  Component Value Date   HGBA1C 6.6 (H) 09/28/2021

## 2022-01-18 NOTE — Telephone Encounter (Signed)
[place] called- wanted to verify if Pt should be on olmesartan '40mg'$  or olmesartan-hctz, med list reviewed. Informed Pt should be on olmesartan '40mg'$ .

## 2022-01-18 NOTE — Patient Instructions (Signed)

## 2022-01-19 LAB — CBC WITH DIFFERENTIAL/PLATELET
Basophils Absolute: 0.1 10*3/uL (ref 0.0–0.1)
Basophils Relative: 1.3 % (ref 0.0–3.0)
Eosinophils Absolute: 0.1 10*3/uL (ref 0.0–0.7)
Eosinophils Relative: 1.6 % (ref 0.0–5.0)
HCT: 42.9 % (ref 36.0–46.0)
Hemoglobin: 14.4 g/dL (ref 12.0–15.0)
Lymphocytes Relative: 54.1 % — ABNORMAL HIGH (ref 12.0–46.0)
Lymphs Abs: 2.2 10*3/uL (ref 0.7–4.0)
MCHC: 33.7 g/dL (ref 30.0–36.0)
MCV: 94.8 fl (ref 78.0–100.0)
Monocytes Absolute: 0.5 10*3/uL (ref 0.1–1.0)
Monocytes Relative: 12.2 % — ABNORMAL HIGH (ref 3.0–12.0)
Neutro Abs: 1.2 10*3/uL — ABNORMAL LOW (ref 1.4–7.7)
Neutrophils Relative %: 30.8 % — ABNORMAL LOW (ref 43.0–77.0)
Platelets: 155 10*3/uL (ref 150.0–400.0)
RBC: 4.52 Mil/uL (ref 3.87–5.11)
RDW: 13.3 % (ref 11.5–15.5)
WBC: 4 10*3/uL (ref 4.0–10.5)

## 2022-01-19 LAB — LIPID PANEL
Cholesterol: 157 mg/dL (ref 0–200)
HDL: 78.2 mg/dL (ref >39.00)
LDL Cholesterol: 65 mg/dL (ref 0–99)
NonHDL: 78.95
Total CHOL/HDL Ratio: 2
Triglycerides: 69 mg/dL (ref 0.0–149.0)
VLDL: 13.8 mg/dL (ref 0.0–40.0)

## 2022-01-19 LAB — COMPREHENSIVE METABOLIC PANEL
ALT: 18 U/L (ref 0–35)
AST: 21 U/L (ref 0–37)
Albumin: 4.3 g/dL (ref 3.5–5.2)
Alkaline Phosphatase: 63 U/L (ref 39–117)
BUN: 12 mg/dL (ref 6–23)
CO2: 31 mEq/L (ref 19–32)
Calcium: 9.7 mg/dL (ref 8.4–10.5)
Chloride: 100 mEq/L (ref 96–112)
Creatinine, Ser: 0.8 mg/dL (ref 0.40–1.20)
GFR: 79.03 mL/min (ref >60.00)
Glucose, Bld: 89 mg/dL (ref 70–99)
Potassium: 3.7 mEq/L (ref 3.5–5.1)
Sodium: 138 mEq/L (ref 135–145)
Total Bilirubin: 0.8 mg/dL (ref 0.2–1.2)
Total Protein: 6.9 g/dL (ref 6.0–8.3)

## 2022-01-23 ENCOUNTER — Encounter: Payer: Self-pay | Admitting: Family Medicine

## 2022-01-24 ENCOUNTER — Telehealth: Payer: Self-pay

## 2022-01-24 NOTE — Telephone Encounter (Signed)
Insurance called stating they need a PA for Trulicity. She states it can be done over the phone at [phone], but they will also fax a form to [place]. Please advise.

## 2022-01-24 NOTE — Telephone Encounter (Signed)
Key: [id number]  Sent to plan

## 2022-01-25 NOTE — Telephone Encounter (Signed)
PA approved  this request is approved from 01/24/2022 to 01/25/2023  Approval sent to pharmacy

## 2022-01-29 ENCOUNTER — Encounter: Payer: Self-pay | Admitting: *Deleted

## 2022-02-01 ENCOUNTER — Encounter: Payer: Self-pay | Admitting: Family Medicine

## 2022-02-01 ENCOUNTER — Ambulatory Visit (INDEPENDENT_AMBULATORY_CARE_PROVIDER_SITE_OTHER): Payer: 59 | Admitting: Family Medicine

## 2022-02-01 VITALS — BP 128/90 | HR 71 | Temp 98.5°F | Resp 16 | Ht 68.0 in

## 2022-02-01 DIAGNOSIS — E785 Hyperlipidemia, unspecified: Secondary | ICD-10-CM | POA: Diagnosis not present

## 2022-02-01 DIAGNOSIS — E1165 Type 2 diabetes mellitus with hyperglycemia: Secondary | ICD-10-CM | POA: Diagnosis not present

## 2022-02-01 DIAGNOSIS — I10 Essential (primary) hypertension: Secondary | ICD-10-CM | POA: Diagnosis not present

## 2022-02-01 DIAGNOSIS — M6530 Trigger finger, unspecified finger: Secondary | ICD-10-CM | POA: Diagnosis not present

## 2022-02-01 DIAGNOSIS — E1169 Type 2 diabetes mellitus with other specified complication: Secondary | ICD-10-CM | POA: Diagnosis not present

## 2022-02-01 NOTE — Assessment & Plan Note (Signed)
Encourage heart healthy diet such as MIND or DASH diet, increase exercise, avoid trans fats, simple carbohydrates and processed foods, consider a krill or fish or flaxseed oil cap daily.  °

## 2022-02-01 NOTE — Assessment & Plan Note (Signed)
Well controlled, no changes to meds. Encouraged heart healthy diet such as the DASH diet and exercise as tolerated.  °

## 2022-02-01 NOTE — Progress Notes (Signed)
Subjective:   By signing my name below, I, [name], attest that this documentation has been prepared under the direction and in the presence of [name], DO. 02/01/2022    Patient ID: Angela Garner, female    DOB: 08/12/1960, 62 y.o.   MRN: [id number]  Chief Complaint  Patient presents with   Hypertension   Follow-up    Hypertension Pertinent negatives include no blurred vision, chest pain, headaches, malaise/fatigue, palpitations or shortness of breath.   Patient is in today for a follow up visit.   She complains of her left middle finger locking up.  Her blood pressure has improved during this visit. She continues taking 5 mg amlodipine and 40 mg olmesartan and reports no new issues while taking them.  BP Readings from Last 3 Encounters:  02/01/22 (!) 128/90  01/18/22 (!) 148/100  11/30/21 120/78   Pulse Readings from Last 3 Encounters:  02/01/22 71  01/18/22 66  11/30/21 60   She recently received trulicity from her pharmacist but did not start it due to her [place] travel to [place]. She is willing to try it. She reports her blood sugars usually run normally but her readings this morning was elevated.  She is UTD on flu vaccines.   Past Medical History:  Diagnosis Date   Abdominal pain 05/07/2017   Abdominal pain, epigastric 02/28/2007   Qualifier: Diagnosis of  By: Cletus Gash MD, [place]     Acute left-sided low back pain with left-sided sciatica 01/14/2015   Acute left-sided low back pain without sciatica 05/09/2017   ALLERGIC RHINITIS 05/15/2006   Qualifier: Diagnosis of  By: Allen Norris     Allergy    Arm numbness 03/22/2020   BACK PAIN 10/31/2006   Qualifier: Diagnosis of  By: Ronnald Ramp CMA, [place]     Bilateral lower extremity edema 10/19/2019   Bronchitis 09/14/2021   Chronic pain of right knee 11/11/2018   Chronic venous insufficiency 01/13/2019   Controlled type 2 diabetes mellitus with chronic kidney disease, without long-term current use of  insulin ([place]) 09/20/2020   CTS (carpal tunnel syndrome) 03/17/2013   Diabetes mellitus without complication ([place])    no meds, only check bs   Elevated CK 06/07/2021   Essential hypertension 11/05/2007   Qualifier: Diagnosis of  By: Jerold Coombe     Fatigue 09/20/2020   GAS/BLOATING 02/28/2007   Qualifier: Diagnosis of  By: Cletus Gash MD, [place] 02/28/2007   Qualifier: Diagnosis of  By: Cletus Gash MD, [place]     Greater trochanteric bursitis of left hip 05/09/2017   Hyperglycemia 07/17/2012   Hyperlipidemia    Hyperlipidemia associated with type 2 diabetes mellitus ([place]) 10/19/2019   HYPERTENSION, BENIGN ESSENTIAL 10/31/2006   Qualifier: Diagnosis of  By: Ronnald Ramp CMA, [place]     KNEE PAIN 01/30/2007   Qualifier: Diagnosis of  By: Cletus Gash MD, [place]     Lymphocytosis 11/06/2011   MURMUR 11/20/2007   Qualifier: Diagnosis of  By: Jerold Coombe     Neck pain 06/07/2021   Palpitations 09/20/2020   Primary hypertension 03/22/2020   SINUSITIS- ACUTE-NOS 04/02/2007   Qualifier: Diagnosis of  By: Cletus Gash MD, [place]     Uncontrolled type 2 diabetes mellitus with hyperglycemia ([place]) 01/30/2019   Urinary frequency 07/17/2012   Urinary incontinence 11/11/2018   Varicose 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 11/11/2018   Vitamin D deficiency 03/22/2020    Past Surgical History:  Procedure Laterality Date  UTERINE FIBROID SURGERY      Family History  Problem Relation Age of Onset   Dementia Mother    Hypertension Father    Diabetes Father    Heart attack Father    Diabetes Sister        she had stomach pain-- cause unknown   Clotting disorder Brother    Colon cancer Neg Hx    Esophageal cancer Neg Hx    Stomach cancer Neg Hx    Rectal cancer Neg Hx     Social History   Socioeconomic History   Marital status: Divorced    Spouse name: Not on file   Number of children: Not on file   Years of education: Not on file   Highest education level: Not on file  Occupational  History   Occupation: [employer]     Comment: [employer]  Tobacco Use   Smoking status: Never   Smokeless tobacco: Never  Vaping Use   Vaping Use: Never used  Substance and Sexual Activity   Alcohol use: No   Drug use: No   Sexual activity: Yes  Other Topics Concern   Not on file  Social History Narrative   Not on file   Social Determinants of Health   Financial Resource Strain: Not on file  Food Insecurity: Not on file  Transportation Needs: Not on file  Physical Activity: Not on file  Stress: Not on file  Social Connections: Not on file  Intimate Partner Violence: Not on file    Outpatient Medications Prior to Visit  Medication Sig Dispense Refill   amLODipine (NORVASC) 5 MG tablet Take 1 tablet (5 mg total) by mouth daily. 90 tablet 1   blood glucose meter kit and supplies KIT Check glucose daily 1 each 0   Dulaglutide (TRULICITY) 3.47 QQ/5.9DG SOPN Inject 0.75 mg into the skin once a week. 2 mL 1   famotidine (PEPCID) 20 MG tablet Take 1 tablet (20 mg total) by mouth 2 (two) times daily. (Patient taking differently: Take 20 mg by mouth as needed for heartburn or indigestion.) 60 tablet 5   fluticasone (FLONASE) 50 MCG/ACT nasal spray Place 2 sprays into both nostrils daily. (Patient taking differently: Place 2 sprays into both nostrils as needed for allergies or rhinitis.) 16 g 6   loratadine (CLARITIN) 10 MG tablet Take 1 tablet (10 mg total) by mouth daily. (Patient taking differently: Take 10 mg by mouth as needed for allergies or rhinitis.) 30 tablet 11   multivitamin-iron-minerals-folic acid (CENTRUM) chewable tablet Chew 1 tablet by mouth daily.     NONFORMULARY OR COMPOUNDED ITEM Compression stocking -- thigh high  20-30 mm/hg #1  Dx varicose veint 1 each 0   olmesartan (BENICAR) 40 MG tablet Take 1 tablet (40 mg total) by mouth daily. 90 tablet 1   rosuvastatin (CRESTOR) 5 MG tablet Take 1 tablet by mouth , MWF 90 tablet 2   No facility-administered  medications prior to visit.    Allergies  Allergen Reactions   Metformin And Related    Pravachol [Pravastatin Sodium] Other (See Comments)    Muscle aches   Simvastatin     "leg pains"    Review of Systems  Constitutional:  Negative for fever and malaise/fatigue.  HENT:  Negative for congestion.   Eyes:  Negative for blurred vision.  Respiratory:  Negative for shortness of breath.   Cardiovascular:  Negative for chest pain, palpitations and leg swelling.  Gastrointestinal:  Negative for abdominal  pain, blood in stool and nausea.  Genitourinary:  Negative for dysuria and frequency.  Musculoskeletal:  Negative for falls.       (+)locking up left middle finger  Skin:  Negative for rash.  Neurological:  Negative for dizziness, loss of consciousness and headaches.  Endo/Heme/Allergies:  Negative for environmental allergies.  Psychiatric/Behavioral:  Negative for depression. The patient is not nervous/anxious.        Objective:    Physical Exam Vitals and nursing note reviewed.  Constitutional:      General: She is not in acute distress.    Appearance: Normal appearance. She is not ill-appearing.  HENT:     Head: Normocephalic and atraumatic.     Right Ear: Tympanic membrane, ear canal and external ear normal.     Left Ear: Tympanic membrane, ear canal and external ear normal.  Eyes:     Extraocular Movements: Extraocular movements intact.     Pupils: Pupils are equal, round, and reactive to light.  Cardiovascular:     Rate and Rhythm: Normal rate and regular rhythm.     Heart sounds: Normal heart sounds. No murmur heard.    No gallop.  Pulmonary:     Effort: Pulmonary effort is normal. No respiratory distress.     Breath sounds: Normal breath sounds. No wheezing or rales.  Abdominal:     General: Bowel sounds are normal. There is no distension.     Palpations: Abdomen is soft.     Tenderness: There is no abdominal tenderness. There is no guarding.  Skin:    General:  Skin is warm and dry.  Neurological:     Mental Status: She is alert and oriented to person, place, and time.  Psychiatric:        Judgment: Judgment normal.     BP (!) 128/90 (BP Location: Left Arm, Patient Position: Sitting, Cuff Size: Normal)   Pulse 71   Temp 98.5 F (36.9 C) (Oral)   Resp 16   Ht '5\' 8"'  (1.727 m)   SpO2 98%   BMI 28.22 kg/m  Wt Readings from Last 3 Encounters:  01/18/22 185 lb 9.6 oz (84.2 kg)  11/30/21 186 lb 0.6 oz (84.4 kg)  10/27/21 185 lb 9.6 oz (84.2 kg)    Diabetic Foot Exam - Simple   No data filed    Lab Results  Component Value Date   WBC 4.0 01/18/2022   HGB 14.4 01/18/2022   HCT 42.9 01/18/2022   PLT 155.0 01/18/2022   GLUCOSE 89 01/18/2022   CHOL 157 01/18/2022   TRIG 69.0 01/18/2022   HDL 78.20 01/18/2022   LDLDIRECT 120.9 08/02/2011   LDLCALC 65 01/18/2022   ALT 18 01/18/2022   AST 21 01/18/2022   NA 138 01/18/2022   K 3.7 01/18/2022   CL 100 01/18/2022   CREATININE 0.80 01/18/2022   BUN 12 01/18/2022   CO2 31 01/18/2022   TSH 2.98 09/20/2020   HGBA1C 6.6 (H) 09/28/2021   MICROALBUR 3.1 (H) 06/19/2021    Lab Results  Component Value Date   TSH 2.98 09/20/2020   Lab Results  Component Value Date   WBC 4.0 01/18/2022   HGB 14.4 01/18/2022   HCT 42.9 01/18/2022   MCV 94.8 01/18/2022   PLT 155.0 01/18/2022   Lab Results  Component Value Date   NA 138 01/18/2022   K 3.7 01/18/2022   CO2 31 01/18/2022   GLUCOSE 89 01/18/2022   BUN 12 01/18/2022   CREATININE 0.80  01/18/2022   BILITOT 0.8 01/18/2022   ALKPHOS 63 01/18/2022   AST 21 01/18/2022   ALT 18 01/18/2022   PROT 6.9 01/18/2022   ALBUMIN 4.3 01/18/2022   CALCIUM 9.7 01/18/2022   GFR 79.03 01/18/2022   Lab Results  Component Value Date   CHOL 157 01/18/2022   Lab Results  Component Value Date   HDL 78.20 01/18/2022   Lab Results  Component Value Date   LDLCALC 65 01/18/2022   Lab Results  Component Value Date   TRIG 69.0 01/18/2022   Lab  Results  Component Value Date   CHOLHDL 2 01/18/2022   Lab Results  Component Value Date   HGBA1C 6.6 (H) 09/28/2021       Assessment & Plan:   Problem List Items Addressed This Visit       Unprioritized   Uncontrolled type 2 diabetes mellitus with hyperglycemia ([place])    trulicity started today Recheck labs in 3 months      Primary hypertension    Well controlled, no changes to meds. Encouraged heart healthy diet such as the DASH diet and exercise as tolerated.       Hyperlipidemia associated with type 2 diabetes mellitus ([place])    Encourage heart healthy diet such as MIND or DASH diet, increase exercise, avoid trans fats, simple carbohydrates and processed foods, consider a krill or fish or flaxseed oil cap daily.       Other Visit Diagnoses     Trigger finger of left hand, unspecified finger    -  Primary   Relevant Orders   Ambulatory referral to Orthopedic Surgery        No orders of the defined types were placed in this encounter.   IAnn Held, DO, personally preformed the services described in this documentation.  All medical record entries made by the scribe were at my direction and in my presence.  I have reviewed the chart and discharge instructions (if applicable) and agree that the record reflects my personal performance and is accurate and complete. 02/01/2022   I,[name],acting as a scribe for [name], DO.,have documented all relevant documentation on the behalf of [name], DO,as directed by  [name], DO while in the presence of [name], DO.   [name], DO

## 2022-02-01 NOTE — Patient Instructions (Signed)

## 2022-02-01 NOTE — Assessment & Plan Note (Signed)
trulicity started today Recheck labs in 3 months

## 2022-02-27 ENCOUNTER — Encounter: Payer: Self-pay | Admitting: Family Medicine

## 2022-02-27 ENCOUNTER — Ambulatory Visit (INDEPENDENT_AMBULATORY_CARE_PROVIDER_SITE_OTHER): Payer: 59 | Admitting: Family Medicine

## 2022-02-27 VITALS — BP 122/88 | HR 79 | Temp 99.5°F | Ht 68.0 in | Wt 189.6 lb

## 2022-02-27 DIAGNOSIS — J029 Acute pharyngitis, unspecified: Secondary | ICD-10-CM

## 2022-02-27 DIAGNOSIS — R059 Cough, unspecified: Secondary | ICD-10-CM | POA: Diagnosis not present

## 2022-02-27 LAB — POC COVID19 BINAXNOW: SARS Coronavirus 2 Ag: NEGATIVE

## 2022-02-27 LAB — POCT INFLUENZA A/B
Influenza A, POC: NEGATIVE
Influenza B, POC: NEGATIVE

## 2022-02-27 LAB — POCT RAPID STREP A (OFFICE): Rapid Strep A Screen: NEGATIVE

## 2022-02-27 MED ORDER — AMOXICILLIN-POT CLAVULANATE 875-125 MG PO TABS: 1.0000 | ORAL_TABLET | Freq: Two times a day (BID) | ORAL | 0 refills | Status: DC

## 2022-02-27 MED ORDER — PROMETHAZINE-DM 6.25-15 MG/5ML PO SYRP: 5.0000 mL | ORAL_SOLUTION | Freq: Four times a day (QID) | ORAL | 0 refills | Status: DC | PRN

## 2022-02-27 NOTE — Progress Notes (Signed)
Established Patient Office Visit  Subjective   Patient ID: Angela Garner, female    DOB: 1960-10-31  Age: 61 y.o. MRN: [id number]  Chief Complaint  Patient presents with   Sore Throat    Started on Friday night    Nasal Congestion    Just got back to [place] last week     HPI Pt is here c/o nasal congestion , chills , sorethroat since sat--- she just returned from [place] last week.  Her sister is sick as well.  Patient Active Problem List   Diagnosis Date Noted   Sore throat 02/27/2022   Travel advice encounter 01/18/2022   Allergy 11/29/2021   Diabetes mellitus without complication ([place]) 19/62/2297   Bronchitis 09/14/2021   Elevated CK 06/07/2021   Neck pain 06/07/2021   Controlled type 2 diabetes mellitus with chronic kidney disease, without long-term current use of insulin ([place]) 09/20/2020   Fatigue 09/20/2020   Palpitations 09/20/2020   Arm numbness 03/22/2020   Primary hypertension 03/22/2020   Vitamin D deficiency 03/22/2020   Hyperlipidemia associated with type 2 diabetes mellitus ([place]) 10/19/2019   Bilateral lower extremity edema 10/19/2019   Uncontrolled type 2 diabetes mellitus with hyperglycemia ([place]) 01/30/2019   Hyperlipidemia    Chronic venous insufficiency 01/13/2019   Varicose 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 11/11/2018   Chronic pain of right knee 11/11/2018   Urinary incontinence 11/11/2018   Greater trochanteric bursitis of left hip 05/09/2017   Acute left-sided low back pain without sciatica 05/09/2017   Abdominal pain 05/07/2017   Acute left-sided low back pain with left-sided sciatica 01/14/2015   CTS (carpal tunnel syndrome) 03/17/2013   Hyperglycemia 07/17/2012   Urinary frequency 07/17/2012   Lymphocytosis 11/06/2011   MURMUR 11/20/2007   Essential hypertension 11/05/2007   SINUSITIS- ACUTE-NOS 04/02/2007   GASTROENTERITIS 02/28/2007   GAS/BLOATING 02/28/2007   ABDOMINAL PAIN, EPIGASTRIC 02/28/2007   KNEE PAIN 01/30/2007    HYPERTENSION, BENIGN ESSENTIAL 10/31/2006   BACK PAIN 10/31/2006   ALLERGIC RHINITIS 05/15/2006   Past Medical History:  Diagnosis Date   Abdominal pain 05/07/2017   Abdominal pain, epigastric 02/28/2007   Qualifier: Diagnosis of  By: Cletus Gash MD, [place]     Acute left-sided low back pain with left-sided sciatica 01/14/2015   Acute left-sided low back pain without sciatica 05/09/2017   ALLERGIC RHINITIS 05/15/2006   Qualifier: Diagnosis of  By: Allen Norris     Allergy    Arm numbness 03/22/2020   BACK PAIN 10/31/2006   Qualifier: Diagnosis of  By: Ronnald Ramp CMA, [place]     Bilateral lower extremity edema 10/19/2019   Bronchitis 09/14/2021   Chronic pain of right knee 11/11/2018   Chronic venous insufficiency 01/13/2019   Controlled type 2 diabetes mellitus with chronic kidney disease, without long-term current use of insulin ([place]) 09/20/2020   CTS (carpal tunnel syndrome) 03/17/2013   Diabetes mellitus without complication ([place])    no meds, only check bs   Elevated CK 06/07/2021   Essential hypertension 11/05/2007   Qualifier: Diagnosis of  By: Jerold Coombe     Fatigue 09/20/2020   GAS/BLOATING 02/28/2007   Qualifier: Diagnosis of  By: Cletus Gash MD, [place] 02/28/2007   Qualifier: Diagnosis of  By: Cletus Gash MD, [place]     Greater trochanteric bursitis of left hip 05/09/2017   Hyperglycemia 07/17/2012   Hyperlipidemia    Hyperlipidemia associated with type 2 diabetes mellitus ([place]) 10/19/2019   HYPERTENSION, BENIGN ESSENTIAL 10/31/2006  Qualifier: Diagnosis of  By: Ronnald Ramp CMA, [place]     KNEE PAIN 01/30/2007   Qualifier: Diagnosis of  By: Cletus Gash MD, [place]     Lymphocytosis 11/06/2011   MURMUR 11/20/2007   Qualifier: Diagnosis of  By: Jerold Coombe     Neck pain 06/07/2021   Palpitations 09/20/2020   Primary hypertension 03/22/2020   SINUSITIS- ACUTE-NOS 04/02/2007   Qualifier: Diagnosis of  By: Cletus Gash MD, [place]     Uncontrolled type 2 diabetes mellitus with  hyperglycemia ([place]) 01/30/2019   Urinary frequency 07/17/2012   Urinary incontinence 11/11/2018   Varicose 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 11/11/2018   Vitamin D deficiency 03/22/2020   Past Surgical History:  Procedure Laterality Date   UTERINE FIBROID SURGERY     Social History   Tobacco Use   Smoking status: Never   Smokeless tobacco: Never  Vaping Use   Vaping Use: Never used  Substance Use Topics   Alcohol use: No   Drug use: No   Social History   Socioeconomic History   Marital status: Divorced    Spouse name: Not on file   Number of children: Not on file   Years of education: Not on file   Highest education level: Not on file  Occupational History   Occupation: [employer]     Comment: [employer]  Tobacco Use   Smoking status: Never   Smokeless tobacco: Never  Vaping Use   Vaping Use: Never used  Substance and Sexual Activity   Alcohol use: No   Drug use: No   Sexual activity: Yes  Other Topics Concern   Not on file  Social History Narrative   Not on file   Social Determinants of Health   Financial Resource Strain: Not on file  Food Insecurity: Not on file  Transportation Needs: Not on file  Physical Activity: Not on file  Stress: Not on file  Social Connections: Not on file  Intimate Partner Violence: Not on file   Family Status  Relation Name Status   Mother  Deceased   Father  Deceased at age 52       [place]   Sister  51   Sister  Deceased at age 75   Brother  Alive   Brother  Alive   Brother  Alive   Brother  Alive   Neg Hx  (Not Specified)   Family History  Problem Relation Age of Onset   Dementia Mother    Hypertension Father    Diabetes Father    Heart attack Father    Diabetes Sister        she had stomach pain-- cause unknown   Clotting disorder Brother    Colon cancer Neg Hx    Esophageal cancer Neg Hx    Stomach cancer Neg Hx    Rectal cancer Neg Hx    Allergies  Allergen Reactions   Metformin And  Related    Pravachol [Pravastatin Sodium] Other (See Comments)    Muscle aches   Simvastatin     "leg pains"      Review of Systems  Constitutional:  Positive for chills and fever. Negative for malaise/fatigue.  HENT:  Positive for congestion, sinus pain and sore throat. Negative for hearing loss.   Eyes:  Negative for discharge.  Respiratory:  Positive for cough and sputum production. Negative for shortness of breath.   Cardiovascular:  Negative for chest pain, palpitations and leg swelling.  Gastrointestinal:  Negative for abdominal pain, blood in stool, constipation, diarrhea, heartburn, nausea and vomiting.  Genitourinary:  Negative for dysuria, frequency, hematuria and urgency.  Musculoskeletal:  Negative for back pain, falls and myalgias.  Skin:  Negative for rash.  Neurological:  Negative for dizziness, sensory change, loss of consciousness, weakness and headaches.  Endo/Heme/Allergies:  Negative for environmental allergies. Does not bruise/bleed easily.  Psychiatric/Behavioral:  Negative for depression and suicidal ideas. The patient is not nervous/anxious and does not have insomnia.       Objective:     BP 122/88   Pulse 79   Temp 99.5 F (37.5 C) (Oral)   Ht '5\' 8"'$  (1.727 m)   Wt 189 lb 9.6 oz (86 kg)   SpO2 98%   BMI 28.83 kg/m  BP Readings from Last 3 Encounters:  02/27/22 122/88  02/01/22 (!) 128/90  01/18/22 (!) 148/100   Wt Readings from Last 3 Encounters:  02/27/22 189 lb 9.6 oz (86 kg)  01/18/22 185 lb 9.6 oz (84.2 kg)  11/30/21 186 lb 0.6 oz (84.4 kg)   SpO2 Readings from Last 3 Encounters:  02/27/22 98%  02/01/22 98%  01/18/22 99%      Physical Exam   Results for orders placed or performed in visit on 02/27/22  POC COVID-19  Result Value Ref Range   SARS Coronavirus 2 Ag Negative Negative  POCT rapid strep A  Result Value Ref Range   Rapid Strep A Screen Negative Negative  POCT Influenza A/B  Result Value Ref Range   Influenza A, POC  Negative Negative   Influenza B, POC Negative Negative    Last CBC Lab Results  Component Value Date   WBC 4.0 01/18/2022   HGB 14.4 01/18/2022   HCT 42.9 01/18/2022   MCV 94.8 01/18/2022   MCH 32.3 01/30/2019   RDW 13.3 01/18/2022   PLT 155.0 59/56/3875   Last metabolic panel Lab Results  Component Value Date   GLUCOSE 89 01/18/2022   NA 138 01/18/2022   K 3.7 01/18/2022   CL 100 01/18/2022   CO2 31 01/18/2022   BUN 12 01/18/2022   CREATININE 0.80 01/18/2022   CALCIUM 9.7 01/18/2022   PROT 6.9 01/18/2022   ALBUMIN 4.3 01/18/2022   BILITOT 0.8 01/18/2022   ALKPHOS 63 01/18/2022   AST 21 01/18/2022   ALT 18 01/18/2022   Last lipids Lab Results  Component Value Date   CHOL 157 01/18/2022   HDL 78.20 01/18/2022   LDLCALC 65 01/18/2022   LDLDIRECT 120.9 08/02/2011   TRIG 69.0 01/18/2022   CHOLHDL 2 01/18/2022   Last hemoglobin A1c Lab Results  Component Value Date   HGBA1C 6.6 (H) 09/28/2021   Last thyroid functions Lab Results  Component Value Date   TSH 2.98 09/20/2020   T4TOTAL 7.5 01/30/2019   Last vitamin D Lab Results  Component Value Date   VD25OH 41.20 09/20/2020   Last vitamin B12 and Folate Lab Results  Component Value Date   VITAMINB12 637 09/20/2020   FOLATE >24.0 01/30/2019      The 10-year ASCVD risk score (Arnett DK, et al., 2019) is: 10.8%    Assessment & Plan:   Problem List Items Addressed This Visit       Unprioritized   Sore throat    Augmentin x 10 days  Gargle with salt water  Cough syrup per orders Flu, covid and strep neg but clinically looks like strep F/u prn  Rest , drink plenty of fluids  Relevant Medications   amoxicillin-clavulanate (AUGMENTIN) 875-125 MG tablet   Other Relevant Orders   POC COVID-19 (Completed)   POCT rapid strep A (Completed)   POCT Influenza A/B (Completed)   Other Visit Diagnoses     Cough, unspecified type    -  Primary   Relevant Medications    promethazine-dextromethorphan (PROMETHAZINE-DM) 6.25-15 MG/5ML syrup   Other Relevant Orders   POC COVID-19 (Completed)   POCT rapid strep A (Completed)   POCT Influenza A/B (Completed)       Return if symptoms worsen or fail to improve.    [name], DO

## 2022-02-27 NOTE — Patient Instructions (Signed)

## 2022-02-27 NOTE — Assessment & Plan Note (Signed)
Augmentin x 10 days  Gargle with salt water  Cough syrup per orders Flu, covid and strep neg but clinically looks like strep F/u prn  Rest , drink plenty of fluids

## 2022-02-28 DIAGNOSIS — M65332 Trigger finger, left middle finger: Secondary | ICD-10-CM | POA: Diagnosis not present

## 2022-03-12 ENCOUNTER — Other Ambulatory Visit: Payer: Self-pay | Admitting: Family

## 2022-03-12 ENCOUNTER — Telehealth: Payer: Self-pay | Admitting: Family Medicine

## 2022-03-12 MED ORDER — METHYLPREDNISOLONE 4 MG PO TBPK: ORAL_TABLET | ORAL | 0 refills | Status: DC

## 2022-03-12 NOTE — Telephone Encounter (Signed)
Pt seen on 11/07 for this concern. Please advise

## 2022-03-12 NOTE — Telephone Encounter (Signed)
Pt called stating that she is still having issues with her cough and sore throat after finishing her medication for it. Pt was wondering how to proceed. Please Advise.

## 2022-03-14 NOTE — Telephone Encounter (Signed)
Noted. VM left

## 2022-03-21 ENCOUNTER — Other Ambulatory Visit: Payer: Self-pay | Admitting: Family Medicine

## 2022-03-21 DIAGNOSIS — Z823 Family history of stroke: Secondary | ICD-10-CM | POA: Diagnosis not present

## 2022-03-21 DIAGNOSIS — R32 Unspecified urinary incontinence: Secondary | ICD-10-CM | POA: Diagnosis not present

## 2022-03-21 DIAGNOSIS — K219 Gastro-esophageal reflux disease without esophagitis: Secondary | ICD-10-CM | POA: Diagnosis not present

## 2022-03-21 DIAGNOSIS — R053 Chronic cough: Secondary | ICD-10-CM | POA: Diagnosis not present

## 2022-03-21 DIAGNOSIS — E119 Type 2 diabetes mellitus without complications: Secondary | ICD-10-CM | POA: Diagnosis not present

## 2022-03-21 DIAGNOSIS — Z8249 Family history of ischemic heart disease and other diseases of the circulatory system: Secondary | ICD-10-CM | POA: Diagnosis not present

## 2022-03-21 DIAGNOSIS — I10 Essential (primary) hypertension: Secondary | ICD-10-CM | POA: Diagnosis not present

## 2022-03-21 DIAGNOSIS — Z833 Family history of diabetes mellitus: Secondary | ICD-10-CM | POA: Diagnosis not present

## 2022-03-21 DIAGNOSIS — R69 Illness, unspecified: Secondary | ICD-10-CM | POA: Diagnosis not present

## 2022-03-21 DIAGNOSIS — J309 Allergic rhinitis, unspecified: Secondary | ICD-10-CM | POA: Diagnosis not present

## 2022-03-21 DIAGNOSIS — E785 Hyperlipidemia, unspecified: Secondary | ICD-10-CM | POA: Diagnosis not present

## 2022-03-21 DIAGNOSIS — E1165 Type 2 diabetes mellitus with hyperglycemia: Secondary | ICD-10-CM

## 2022-04-02 DIAGNOSIS — Z1231 Encounter for screening mammogram for malignant neoplasm of breast: Secondary | ICD-10-CM | POA: Diagnosis not present

## 2022-04-02 DIAGNOSIS — Z01411 Encounter for gynecological examination (general) (routine) with abnormal findings: Secondary | ICD-10-CM | POA: Diagnosis not present

## 2022-04-02 DIAGNOSIS — Z6829 Body mass index (BMI) 29.0-29.9, adult: Secondary | ICD-10-CM | POA: Diagnosis not present

## 2022-04-02 DIAGNOSIS — Z01419 Encounter for gynecological examination (general) (routine) without abnormal findings: Secondary | ICD-10-CM | POA: Diagnosis not present

## 2022-04-02 DIAGNOSIS — N952 Postmenopausal atrophic vaginitis: Secondary | ICD-10-CM | POA: Diagnosis not present

## 2022-04-02 DIAGNOSIS — Z124 Encounter for screening for malignant neoplasm of cervix: Secondary | ICD-10-CM | POA: Diagnosis not present

## 2022-04-02 DIAGNOSIS — R69 Illness, unspecified: Secondary | ICD-10-CM | POA: Diagnosis not present

## 2022-04-25 ENCOUNTER — Other Ambulatory Visit: Payer: Self-pay | Admitting: Family Medicine

## 2022-04-25 DIAGNOSIS — E1165 Type 2 diabetes mellitus with hyperglycemia: Secondary | ICD-10-CM

## 2022-05-01 ENCOUNTER — Ambulatory Visit: Payer: 59 | Admitting: Family Medicine

## 2022-05-01 NOTE — Progress Notes (Incomplete)
Subjective:   By signing my name below, I, [name], attest that this documentation has been prepared under the direction and in the presence of [place], [place] DO 05/01/2022   Patient ID: Angela Garner, female    DOB: 14-Apr-1961, 62 y.o.   MRN: [id number]  No chief complaint on file.   HPI Patient is in today for a 3 month follow up.   She denies having any fever, new muscle pain, new joint pain, new moles, congestion, sinus pain, sore throat, chest pain, palpitations, cough, SOB, wheezing, n/v/d, constipation, blood in stool, dysuria, frequency, hematuria, or headaches at this time.   Past Medical History:  Diagnosis Date   Abdominal pain 05/07/2017   Abdominal pain, epigastric 02/28/2007   Qualifier: Diagnosis of  By: Cletus Gash MD, [place]     Acute left-sided low back pain with left-sided sciatica 01/14/2015   Acute left-sided low back pain without sciatica 05/09/2017   ALLERGIC RHINITIS 05/15/2006   Qualifier: Diagnosis of  By: Allen Norris     Allergy    Arm numbness 03/22/2020   BACK PAIN 10/31/2006   Qualifier: Diagnosis of  By: Ronnald Ramp CMA, [place]     Bilateral lower extremity edema 10/19/2019   Bronchitis 09/14/2021   Chronic pain of right knee 11/11/2018   Chronic venous insufficiency 01/13/2019   Controlled type 2 diabetes mellitus with chronic kidney disease, without long-term current use of insulin ([place]) 09/20/2020   CTS (carpal tunnel syndrome) 03/17/2013   Diabetes mellitus without complication ([place])    no meds, only check bs   Elevated CK 06/07/2021   Essential hypertension 11/05/2007   Qualifier: Diagnosis of  By: Jerold Coombe     Fatigue 09/20/2020   GAS/BLOATING 02/28/2007   Qualifier: Diagnosis of  By: Cletus Gash MD, [place] 02/28/2007   Qualifier: Diagnosis of  By: Cletus Gash MD, [place]     Greater trochanteric bursitis of left hip 05/09/2017   Hyperglycemia 07/17/2012   Hyperlipidemia    Hyperlipidemia associated with type 2 diabetes  mellitus ([place]) 10/19/2019   HYPERTENSION, BENIGN ESSENTIAL 10/31/2006   Qualifier: Diagnosis of  By: Ronnald Ramp CMA, [place] PAIN 01/30/2007   Qualifier: Diagnosis of  By: Cletus Gash MD, [place]     Lymphocytosis 11/06/2011   MURMUR 11/20/2007   Qualifier: Diagnosis of  By: Jerold Coombe     Neck pain 06/07/2021   Palpitations 09/20/2020   Primary hypertension 03/22/2020   SINUSITIS- ACUTE-NOS 04/02/2007   Qualifier: Diagnosis of  By: Cletus Gash MD, [place]     Uncontrolled type 2 diabetes mellitus with hyperglycemia ([place]) 01/30/2019   Urinary frequency 07/17/2012   Urinary incontinence 11/11/2018   Varicose 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 11/11/2018   Vitamin D deficiency 03/22/2020    Past Surgical History:  Procedure Laterality Date   UTERINE FIBROID SURGERY      Family History  Problem Relation Age of Onset   Dementia Mother    Hypertension Father    Diabetes Father    Heart attack Father    Diabetes Sister        she had stomach pain-- cause unknown   Clotting disorder Brother    Colon cancer Neg Hx    Esophageal cancer Neg Hx    Stomach cancer Neg Hx    Rectal cancer Neg Hx     Social History   Socioeconomic History   Marital status: Divorced    Spouse name:  Not on file   Number of children: Not on file   Years of education: Not on file   Highest education level: Not on file  Occupational History   Occupation: [employer]     Comment: [employer]  Tobacco Use   Smoking status: Never   Smokeless tobacco: Never  Vaping Use   Vaping Use: Never used  Substance and Sexual Activity   Alcohol use: No   Drug use: No   Sexual activity: Yes  Other Topics Concern   Not on file  Social History Narrative   Not on file   Social Determinants of Health   Financial Resource Strain: Not on file  Food Insecurity: Not on file  Transportation Needs: Not on file  Physical Activity: Not on file  Stress: Not on file  Social Connections: Not on file   Intimate Partner Violence: Not on file    Outpatient Medications Prior to Visit  Medication Sig Dispense Refill   methylPREDNISolone (MEDROL DOSEPAK) 4 MG TBPK tablet As directed 21 tablet 0   amLODipine (NORVASC) 5 MG tablet Take 1 tablet (5 mg total) by mouth daily. 90 tablet 1   amoxicillin-clavulanate (AUGMENTIN) 875-125 MG tablet Take 1 tablet by mouth 2 (two) times daily. 20 tablet 0   blood glucose meter kit and supplies KIT Check glucose daily 1 each 0   famotidine (PEPCID) 20 MG tablet Take 1 tablet (20 mg total) by mouth 2 (two) times daily. (Patient taking differently: Take 20 mg by mouth as needed for heartburn or indigestion.) 60 tablet 5   fluticasone (FLONASE) 50 MCG/ACT nasal spray Place 2 sprays into both nostrils daily. (Patient taking differently: Place 2 sprays into both nostrils as needed for allergies or rhinitis.) 16 g 6   loratadine (CLARITIN) 10 MG tablet Take 1 tablet (10 mg total) by mouth daily. (Patient taking differently: Take 10 mg by mouth as needed for allergies or rhinitis.) 30 tablet 11   multivitamin-iron-minerals-folic acid (CENTRUM) chewable tablet Chew 1 tablet by mouth daily.     NONFORMULARY OR COMPOUNDED ITEM Compression stocking -- thigh high  20-30 mm/hg #1  Dx varicose veint 1 each 0   olmesartan (BENICAR) 40 MG tablet Take 1 tablet (40 mg total) by mouth daily. 90 tablet 1   promethazine-dextromethorphan (PROMETHAZINE-DM) 6.25-15 MG/5ML syrup Take 5 mLs by mouth 4 (four) times daily as needed. 118 mL 0   rosuvastatin (CRESTOR) 5 MG tablet Take 1 tablet by mouth , MWF 90 tablet 2   TRULICITY 9.32 TF/5.7DU SOPN INJECT 0.75 MG SUBCUTANEOUSLY ONCE A WEEK 4 mL 0   No facility-administered medications prior to visit.    Allergies  Allergen Reactions   Metformin And Related    Pravachol [Pravastatin Sodium] Other (See Comments)    Muscle aches   Simvastatin     "leg pains"    ROS     Objective:    Physical Exam Constitutional:       General: She is not in acute distress.    Appearance: Normal appearance.  HENT:     Head: Normocephalic and atraumatic.     Right Ear: External ear normal.     Left Ear: External ear normal.  Eyes:     Extraocular Movements: Extraocular movements intact.     Pupils: Pupils are equal, round, and reactive to light.  Cardiovascular:     Rate and Rhythm: Normal rate and regular rhythm.     Pulses: Normal pulses.  Heart sounds: Normal heart sounds. No murmur heard.    No gallop.  Pulmonary:     Effort: Pulmonary effort is normal. No respiratory distress.     Breath sounds: Normal breath sounds. No wheezing.  Skin:    General: Skin is warm and dry.  Neurological:     Mental Status: She is alert and oriented to person, place, and time.  Psychiatric:        Judgment: Judgment normal.     There were no vitals taken for this visit. Wt Readings from Last 3 Encounters:  02/27/22 189 lb 9.6 oz (86 kg)  01/18/22 185 lb 9.6 oz (84.2 kg)  11/30/21 186 lb 0.6 oz (84.4 kg)    Diabetic Foot Exam - Simple   No data filed    Lab Results  Component Value Date   WBC 4.0 01/18/2022   HGB 14.4 01/18/2022   HCT 42.9 01/18/2022   PLT 155.0 01/18/2022   GLUCOSE 89 01/18/2022   CHOL 157 01/18/2022   TRIG 69.0 01/18/2022   HDL 78.20 01/18/2022   LDLDIRECT 120.9 08/02/2011   LDLCALC 65 01/18/2022   ALT 18 01/18/2022   AST 21 01/18/2022   NA 138 01/18/2022   K 3.7 01/18/2022   CL 100 01/18/2022   CREATININE 0.80 01/18/2022   BUN 12 01/18/2022   CO2 31 01/18/2022   TSH 2.98 09/20/2020   HGBA1C 6.6 (H) 09/28/2021   MICROALBUR 3.1 (H) 06/19/2021    Lab Results  Component Value Date   TSH 2.98 09/20/2020   Lab Results  Component Value Date   WBC 4.0 01/18/2022   HGB 14.4 01/18/2022   HCT 42.9 01/18/2022   MCV 94.8 01/18/2022   PLT 155.0 01/18/2022   Lab Results  Component Value Date   NA 138 01/18/2022   K 3.7 01/18/2022   CO2 31 01/18/2022   GLUCOSE 89 01/18/2022   BUN  12 01/18/2022   CREATININE 0.80 01/18/2022   BILITOT 0.8 01/18/2022   ALKPHOS 63 01/18/2022   AST 21 01/18/2022   ALT 18 01/18/2022   PROT 6.9 01/18/2022   ALBUMIN 4.3 01/18/2022   CALCIUM 9.7 01/18/2022   GFR 79.03 01/18/2022   Lab Results  Component Value Date   CHOL 157 01/18/2022   Lab Results  Component Value Date   HDL 78.20 01/18/2022   Lab Results  Component Value Date   LDLCALC 65 01/18/2022   Lab Results  Component Value Date   TRIG 69.0 01/18/2022   Lab Results  Component Value Date   CHOLHDL 2 01/18/2022   Lab Results  Component Value Date   HGBA1C 6.6 (H) 09/28/2021       Assessment & Plan:   Problem List Items Addressed This Visit   None   No orders of the defined types were placed in this encounter.   I, [name], personally preformed the services described in this documentation.  All medical record entries made by the scribe were at my direction and in my presence.  I have reviewed the chart and discharge instructions (if applicable) and agree that the record reflects my personal performance and is accurate and complete. 05/01/22   I,Rachel Rivera,acting as a scribe for [name], DO.,have documented all relevant documentation on the behalf of [name], DO,as directed by  [name], DO while in the presence of [name], DO.   [name]

## 2022-05-04 ENCOUNTER — Ambulatory Visit (INDEPENDENT_AMBULATORY_CARE_PROVIDER_SITE_OTHER): Payer: 59 | Admitting: Family Medicine

## 2022-05-04 ENCOUNTER — Encounter: Payer: Self-pay | Admitting: Family Medicine

## 2022-05-04 VITALS — BP 132/82 | HR 72 | Temp 98.1°F | Resp 18 | Ht 68.0 in | Wt 191.8 lb

## 2022-05-04 DIAGNOSIS — E1169 Type 2 diabetes mellitus with other specified complication: Secondary | ICD-10-CM

## 2022-05-04 DIAGNOSIS — E1165 Type 2 diabetes mellitus with hyperglycemia: Secondary | ICD-10-CM | POA: Diagnosis not present

## 2022-05-04 DIAGNOSIS — I10 Essential (primary) hypertension: Secondary | ICD-10-CM | POA: Diagnosis not present

## 2022-05-04 DIAGNOSIS — E785 Hyperlipidemia, unspecified: Secondary | ICD-10-CM | POA: Diagnosis not present

## 2022-05-04 DIAGNOSIS — I83813 Varicose veins of bilateral lower extremities with pain: Secondary | ICD-10-CM

## 2022-05-04 LAB — COMPREHENSIVE METABOLIC PANEL
ALT: 21 U/L (ref 0–35)
AST: 23 U/L (ref 0–37)
Albumin: 4.5 g/dL (ref 3.5–5.2)
Alkaline Phosphatase: 69 U/L (ref 39–117)
BUN: 11 mg/dL (ref 6–23)
CO2: 31 mEq/L (ref 19–32)
Calcium: 9.4 mg/dL (ref 8.4–10.5)
Chloride: 104 mEq/L (ref 96–112)
Creatinine, Ser: 0.77 mg/dL (ref 0.40–1.20)
GFR: 82.57 mL/min (ref >60.00)
Glucose, Bld: 96 mg/dL (ref 70–99)
Potassium: 3.8 mEq/L (ref 3.5–5.1)
Sodium: 143 mEq/L (ref 135–145)
Total Bilirubin: 0.7 mg/dL (ref 0.2–1.2)
Total Protein: 7.2 g/dL (ref 6.0–8.3)

## 2022-05-04 LAB — MICROALBUMIN / CREATININE URINE RATIO
Creatinine,U: 93.2 mg/dL
Microalb Creat Ratio: 1.3 mg/g (ref 0.0–30.0)
Microalb, Ur: 1.2 mg/dL (ref 0.0–1.9)

## 2022-05-04 LAB — LIPID PANEL
Cholesterol: 183 mg/dL (ref 0–200)
HDL: 84.3 mg/dL (ref >39.00)
LDL Cholesterol: 90 mg/dL (ref 0–99)
NonHDL: 98.23
Total CHOL/HDL Ratio: 2
Triglycerides: 42 mg/dL (ref 0.0–149.0)
VLDL: 8.4 mg/dL (ref 0.0–40.0)

## 2022-05-04 LAB — HEMOGLOBIN A1C: Hgb A1c MFr Bld: 6.1 % (ref 4.6–6.5)

## 2022-05-04 NOTE — Assessment & Plan Note (Signed)
Check labs  hgba1c to be checked, minimize simple carbs. Increase exercise as tolerated. Continue current meds  

## 2022-05-04 NOTE — Progress Notes (Addendum)
Subjective:   By signing my name below, I, [name], attest that this documentation has been prepared under the direction and in the presence of [name], DO. 05/04/2022   Patient ID: Angela Garner, female    DOB: 24-Jun-1960, 62 y.o.   MRN: [id number]  Chief Complaint  Patient presents with   Hypertension   Hyperlipidemia   Follow-up    HPI Patient is in today for a follow-up visit.  She c/o weight gain over the holidays  Wt Readings from Last 3 Encounters:  05/04/22 191 lb 12.8 oz (87 kg)  02/27/22 189 lb 9.6 oz (86 kg)  01/18/22 185 lb 9.6 oz (84.2 kg)   Her blood pressure is within normal range today. BP Readings from Last 3 Encounters:  05/04/22 132/82  02/27/22 122/88  02/01/22 (!) 128/90   Pulse Readings from Last 3 Encounters:  05/04/22 72  02/27/22 79  02/01/22 71   She is interested in treating her urinary incontinence and is following-up with her GYN specialist.  She is UTD on eye care but was not able to see a diabetic specialist. She reports being UTD on her tetanus vaccine.--- she will let us know when she had it    Past Medical History:  Diagnosis Date   Abdominal pain 05/07/2017   Abdominal pain, epigastric 02/28/2007   Qualifier: Diagnosis of  By: Cletus Gash MD, [place]     Acute left-sided low back pain with left-sided sciatica 01/14/2015   Acute left-sided low back pain without sciatica 05/09/2017   ALLERGIC RHINITIS 05/15/2006   Qualifier: Diagnosis of  By: Allen Norris     Allergy    Arm numbness 03/22/2020   BACK PAIN 10/31/2006   Qualifier: Diagnosis of  By: Ronnald Ramp CMA, [place]     Bilateral lower extremity edema 10/19/2019   Bronchitis 09/14/2021   Chronic pain of right knee 11/11/2018   Chronic venous insufficiency 01/13/2019   Controlled type 2 diabetes mellitus with chronic kidney disease, without long-term current use of insulin ([place]) 09/20/2020   CTS (carpal tunnel syndrome) 03/17/2013   Diabetes mellitus without complication  ([place])    no meds, only check bs   Elevated CK 06/07/2021   Essential hypertension 11/05/2007   Qualifier: Diagnosis of  By: Jerold Coombe     Fatigue 09/20/2020   GAS/BLOATING 02/28/2007   Qualifier: Diagnosis of  By: Cletus Gash MD, [place] 02/28/2007   Qualifier: Diagnosis of  By: Cletus Gash MD, [place]     Greater trochanteric bursitis of left hip 05/09/2017   Hyperglycemia 07/17/2012   Hyperlipidemia    Hyperlipidemia associated with type 2 diabetes mellitus ([place]) 10/19/2019   HYPERTENSION, BENIGN ESSENTIAL 10/31/2006   Qualifier: Diagnosis of  By: Ronnald Ramp CMA, [place]     KNEE PAIN 01/30/2007   Qualifier: Diagnosis of  By: Cletus Gash MD, [place]     Lymphocytosis 11/06/2011   MURMUR 11/20/2007   Qualifier: Diagnosis of  By: Jerold Coombe     Neck pain 06/07/2021   Palpitations 09/20/2020   Primary hypertension 03/22/2020   SINUSITIS- ACUTE-NOS 04/02/2007   Qualifier: Diagnosis of  By: Cletus Gash MD, [place]     Uncontrolled type 2 diabetes mellitus with hyperglycemia ([place]) 01/30/2019   Urinary frequency 07/17/2012   Urinary incontinence 11/11/2018   Varicose 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 11/11/2018   Vitamin D deficiency 03/22/2020    Past Surgical History:  Procedure Laterality Date   UTERINE FIBROID SURGERY  Family History  Problem Relation Age of Onset   Dementia Mother    Hypertension Father    Diabetes Father    Heart attack Father    Diabetes Sister        she had stomach pain-- cause unknown   Clotting disorder Brother    Colon cancer Neg Hx    Esophageal cancer Neg Hx    Stomach cancer Neg Hx    Rectal cancer Neg Hx     Social History   Socioeconomic History   Marital status: Divorced    Spouse name: Not on file   Number of children: Not on file   Years of education: Not on file   Highest education level: Not on file  Occupational History   Occupation: [employer]     Comment: [employer]  Tobacco Use   Smoking status: Never    Smokeless tobacco: Never  Vaping Use   Vaping Use: Never used  Substance and Sexual Activity   Alcohol use: No   Drug use: No   Sexual activity: Yes  Other Topics Concern   Not on file  Social History Narrative   Not on file   Social Determinants of Health   Financial Resource Strain: Not on file  Food Insecurity: Not on file  Transportation Needs: Not on file  Physical Activity: Not on file  Stress: Not on file  Social Connections: Not on file  Intimate Partner Violence: Not on file    Outpatient Medications Prior to Visit  Medication Sig Dispense Refill   amLODipine (NORVASC) 5 MG tablet Take 1 tablet (5 mg total) by mouth daily. 90 tablet 1   blood glucose meter kit and supplies KIT Check glucose daily 1 each 0   famotidine (PEPCID) 20 MG tablet Take 1 tablet (20 mg total) by mouth 2 (two) times daily. (Patient taking differently: Take 20 mg by mouth as needed for heartburn or indigestion.) 60 tablet 5   fluticasone (FLONASE) 50 MCG/ACT nasal spray Place 2 sprays into both nostrils daily. (Patient taking differently: Place 2 sprays into both nostrils as needed for allergies or rhinitis.) 16 g 6   loratadine (CLARITIN) 10 MG tablet Take 1 tablet (10 mg total) by mouth daily. (Patient taking differently: Take 10 mg by mouth as needed for allergies or rhinitis.) 30 tablet 11   multivitamin-iron-minerals-folic acid (CENTRUM) chewable tablet Chew 1 tablet by mouth daily.     NONFORMULARY OR COMPOUNDED ITEM Compression stocking -- thigh high  20-30 mm/hg #1  Dx varicose veint 1 each 0   olmesartan (BENICAR) 40 MG tablet Take 1 tablet (40 mg total) by mouth daily. 90 tablet 1   rosuvastatin (CRESTOR) 5 MG tablet Take 1 tablet by mouth , MWF 90 tablet 2   TRULICITY 6.38 VF/6.4PP SOPN INJECT 0.75 MG SUBCUTANEOUSLY ONCE A WEEK 4 mL 0   amoxicillin-clavulanate (AUGMENTIN) 875-125 MG tablet Take 1 tablet by mouth 2 (two) times daily. 20 tablet 0   methylPREDNISolone (MEDROL DOSEPAK) 4  MG TBPK tablet As directed 21 tablet 0   promethazine-dextromethorphan (PROMETHAZINE-DM) 6.25-15 MG/5ML syrup Take 5 mLs by mouth 4 (four) times daily as needed. 118 mL 0   No facility-administered medications prior to visit.    Allergies  Allergen Reactions   Metformin And Related    Pravachol [Pravastatin Sodium] Other (See Comments)    Muscle aches   Simvastatin     "leg pains"    Review of Systems  Constitutional:  Negative for  fever and malaise/fatigue.  HENT:  Negative for congestion.   Eyes:  Negative for blurred vision.  Respiratory:  Negative for shortness of breath.   Cardiovascular:  Negative for chest pain, palpitations and leg swelling.  Gastrointestinal:  Negative for abdominal pain, blood in stool and nausea.  Genitourinary:  Negative for dysuria and frequency.       (+)urinary incontinence  Musculoskeletal:  Negative for falls.  Skin:  Negative for rash.  Neurological:  Negative for dizziness, loss of consciousness and headaches.  Endo/Heme/Allergies:  Negative for environmental allergies.  Psychiatric/Behavioral:  Negative for depression. The patient is not nervous/anxious.        Objective:    Physical Exam Vitals and nursing note reviewed.  Constitutional:      Appearance: Normal appearance.  HENT:     Head: Normocephalic and atraumatic.     Right Ear: External ear normal.     Left Ear: External ear normal.  Eyes:     Extraocular Movements: Extraocular movements intact.     Pupils: Pupils are equal, round, and reactive to light.  Cardiovascular:     Rate and Rhythm: Normal rate and regular rhythm.     Heart sounds: Normal heart sounds. No murmur heard.    No gallop.  Pulmonary:     Effort: Pulmonary effort is normal. No respiratory distress.     Breath sounds: Normal breath sounds. No wheezing or rales.  Neurological:     Mental Status: She is alert and oriented to person, place, and time.  Psychiatric:        Judgment: Judgment normal.      BP 132/82 (BP Location: Left Arm, Patient Position: Sitting, Cuff Size: Normal)   Pulse 72   Temp 98.1 F (36.7 C) (Oral)   Resp 18   Ht '5\' 8"'$  (1.727 m)   Wt 191 lb 12.8 oz (87 kg)   SpO2 97%   BMI 29.16 kg/m  Wt Readings from Last 3 Encounters:  05/04/22 191 lb 12.8 oz (87 kg)  02/27/22 189 lb 9.6 oz (86 kg)  01/18/22 185 lb 9.6 oz (84.2 kg)       Assessment & Plan:  Primary hypertension -     Comprehensive metabolic panel -     Lipid panel  Uncontrolled type 2 diabetes mellitus with hyperglycemia ([facility]) Assessment & Plan: Check labs  hgba1c to be checked , minimize simple carbs. Increase exercise as tolerated. Continue current meds   Orders: -     Comprehensive metabolic panel -     Lipid panel -     Hemoglobin A1c -     Microalbumin / creatinine urine ratio  Hyperlipidemia associated with type 2 diabetes mellitus ([place]) Assessment & Plan: Encourage heart healthy diet such as MIND or DASH diet, increase exercise, avoid trans fats, simple carbohydrates and processed foods, consider a krill or fish or flaxseed oil cap daily.    Orders: -     Comprehensive metabolic panel -     Lipid panel  Hyperlipidemia, unspecified hyperlipidemia type Assessment & Plan: Encourage heart healthy diet such as MIND or DASH diet, increase exercise, avoid trans fats, simple carbohydrates and processed foods, consider a krill or fish or flaxseed oil cap daily.     Varicose 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 Assessment & Plan: Per gyn     I, [name], DO, personally preformed the services described in this documentation.  All medical record entries made by the scribe were at  my direction and in my presence.  I have reviewed the chart and discharge instructions (if applicable) and agree that the record reflects my personal performance and is accurate and complete. 05/04/2022   I,[name],acting as a scribe for [name], DO.,have documented all  relevant documentation on the behalf of [name], DO,as directed by  [name], DO while in the presence of [name], DO.   [name], DO

## 2022-05-04 NOTE — Assessment & Plan Note (Signed)
Encourage heart healthy diet such as MIND or DASH diet, increase exercise, avoid trans fats, simple carbohydrates and processed foods, consider a krill or fish or flaxseed oil cap daily.  °

## 2022-05-04 NOTE — Assessment & Plan Note (Signed)
Per gyn 

## 2022-05-17 ENCOUNTER — Other Ambulatory Visit: Payer: Self-pay | Admitting: Family Medicine

## 2022-05-17 DIAGNOSIS — E1165 Type 2 diabetes mellitus with hyperglycemia: Secondary | ICD-10-CM

## 2022-07-30 ENCOUNTER — Other Ambulatory Visit: Payer: Self-pay | Admitting: Family Medicine

## 2022-07-30 DIAGNOSIS — E1165 Type 2 diabetes mellitus with hyperglycemia: Secondary | ICD-10-CM

## 2022-07-30 DIAGNOSIS — I10 Essential (primary) hypertension: Secondary | ICD-10-CM

## 2022-08-08 DIAGNOSIS — Z8249 Family history of ischemic heart disease and other diseases of the circulatory system: Secondary | ICD-10-CM | POA: Diagnosis not present

## 2022-08-08 DIAGNOSIS — Z7985 Long-term (current) use of injectable non-insulin antidiabetic drugs: Secondary | ICD-10-CM | POA: Diagnosis not present

## 2022-08-08 DIAGNOSIS — E785 Hyperlipidemia, unspecified: Secondary | ICD-10-CM | POA: Diagnosis not present

## 2022-08-08 DIAGNOSIS — R32 Unspecified urinary incontinence: Secondary | ICD-10-CM | POA: Diagnosis not present

## 2022-08-08 DIAGNOSIS — J309 Allergic rhinitis, unspecified: Secondary | ICD-10-CM | POA: Diagnosis not present

## 2022-08-08 DIAGNOSIS — Z6830 Body mass index (BMI) 30.0-30.9, adult: Secondary | ICD-10-CM | POA: Diagnosis not present

## 2022-08-08 DIAGNOSIS — I10 Essential (primary) hypertension: Secondary | ICD-10-CM | POA: Diagnosis not present

## 2022-08-08 DIAGNOSIS — E669 Obesity, unspecified: Secondary | ICD-10-CM | POA: Diagnosis not present

## 2022-08-08 DIAGNOSIS — E119 Type 2 diabetes mellitus without complications: Secondary | ICD-10-CM | POA: Diagnosis not present

## 2022-08-08 DIAGNOSIS — K219 Gastro-esophageal reflux disease without esophagitis: Secondary | ICD-10-CM | POA: Diagnosis not present

## 2022-08-08 DIAGNOSIS — Z833 Family history of diabetes mellitus: Secondary | ICD-10-CM | POA: Diagnosis not present

## 2022-08-10 ENCOUNTER — Ambulatory Visit (INDEPENDENT_AMBULATORY_CARE_PROVIDER_SITE_OTHER): Payer: 59 | Admitting: Family Medicine

## 2022-08-10 ENCOUNTER — Encounter: Payer: Self-pay | Admitting: Family Medicine

## 2022-08-10 VITALS — BP 118/80 | HR 78 | Temp 99.0°F | Resp 18 | Ht 68.0 in | Wt 191.6 lb

## 2022-08-10 DIAGNOSIS — I10 Essential (primary) hypertension: Secondary | ICD-10-CM | POA: Diagnosis not present

## 2022-08-10 DIAGNOSIS — E1165 Type 2 diabetes mellitus with hyperglycemia: Secondary | ICD-10-CM | POA: Diagnosis not present

## 2022-08-10 DIAGNOSIS — M170 Bilateral primary osteoarthritis of knee: Secondary | ICD-10-CM | POA: Insufficient documentation

## 2022-08-10 DIAGNOSIS — E1169 Type 2 diabetes mellitus with other specified complication: Secondary | ICD-10-CM | POA: Diagnosis not present

## 2022-08-10 DIAGNOSIS — M5136 Other intervertebral disc degeneration, lumbar region: Secondary | ICD-10-CM | POA: Diagnosis not present

## 2022-08-10 DIAGNOSIS — Z7985 Long-term (current) use of injectable non-insulin antidiabetic drugs: Secondary | ICD-10-CM

## 2022-08-10 DIAGNOSIS — E785 Hyperlipidemia, unspecified: Secondary | ICD-10-CM

## 2022-08-10 LAB — COMPREHENSIVE METABOLIC PANEL
ALT: 21 U/L (ref 0–35)
AST: 28 U/L (ref 0–37)
Albumin: 4.5 g/dL (ref 3.5–5.2)
Alkaline Phosphatase: 69 U/L (ref 39–117)
BUN: 14 mg/dL (ref 6–23)
CO2: 28 mEq/L (ref 19–32)
Calcium: 9.6 mg/dL (ref 8.4–10.5)
Chloride: 102 mEq/L (ref 96–112)
Creatinine, Ser: 0.83 mg/dL (ref 0.40–1.20)
GFR: 75.32 mL/min (ref >60.00)
Glucose, Bld: 96 mg/dL (ref 70–99)
Potassium: 3.7 mEq/L (ref 3.5–5.1)
Sodium: 140 mEq/L (ref 135–145)
Total Bilirubin: 0.8 mg/dL (ref 0.2–1.2)
Total Protein: 7.4 g/dL (ref 6.0–8.3)

## 2022-08-10 LAB — CBC WITH DIFFERENTIAL/PLATELET
Basophils Absolute: 0 10*3/uL (ref 0.0–0.1)
Basophils Relative: 0.9 % (ref 0.0–3.0)
Eosinophils Absolute: 0.1 10*3/uL (ref 0.0–0.7)
Eosinophils Relative: 1.2 % (ref 0.0–5.0)
HCT: 44.4 % (ref 36.0–46.0)
Hemoglobin: 14.9 g/dL (ref 12.0–15.0)
Lymphocytes Relative: 46 % (ref 12.0–46.0)
Lymphs Abs: 2.3 10*3/uL (ref 0.7–4.0)
MCHC: 33.4 g/dL (ref 30.0–36.0)
MCV: 93.3 fl (ref 78.0–100.0)
Monocytes Absolute: 0.4 10*3/uL (ref 0.1–1.0)
Monocytes Relative: 8.3 % (ref 3.0–12.0)
Neutro Abs: 2.2 10*3/uL (ref 1.4–7.7)
Neutrophils Relative %: 43.6 % (ref 43.0–77.0)
Platelets: 189 10*3/uL (ref 150.0–400.0)
RBC: 4.76 Mil/uL (ref 3.87–5.11)
RDW: 13.9 % (ref 11.5–15.5)
WBC: 5 10*3/uL (ref 4.0–10.5)

## 2022-08-10 LAB — LIPID PANEL
Cholesterol: 210 mg/dL — ABNORMAL HIGH (ref 0–200)
HDL: 80.4 mg/dL (ref >39.00)
LDL Cholesterol: 119 mg/dL — ABNORMAL HIGH (ref 0–99)
NonHDL: 129.29
Total CHOL/HDL Ratio: 3
Triglycerides: 50 mg/dL (ref 0.0–149.0)
VLDL: 10 mg/dL (ref 0.0–40.0)

## 2022-08-10 LAB — HEMOGLOBIN A1C: Hgb A1c MFr Bld: 6.1 % (ref 4.6–6.5)

## 2022-08-10 MED ORDER — CYCLOBENZAPRINE HCL 5 MG PO TABS: 5.0000 mg | ORAL_TABLET | Freq: Three times a day (TID) | ORAL | 1 refills | Status: DC | PRN

## 2022-08-10 NOTE — Assessment & Plan Note (Signed)
Well controlled, no changes to meds. Encouraged heart healthy diet such as the DASH diet and exercise as tolerated.  °

## 2022-08-10 NOTE — Assessment & Plan Note (Signed)
Tolerating statin, encouraged heart healthy diet, avoid trans fats, minimize simple carbs and saturated fats. Increase exercise as tolerated 

## 2022-08-10 NOTE — Assessment & Plan Note (Signed)
Good strength lower extremities Muscle relaxer ---  use salon paas patch Con't tylenol as needed  PT referral placed

## 2022-08-10 NOTE — Progress Notes (Addendum)
Subjective:   By signing my name below, I, Angela Garner, attest that this documentation has been prepared under the direction and in the presence of Donato Schultz, DO 08/10/22   Patient ID: Angela Garner, female    DOB: Jan 06, 1961, 62 y.o.   MRN: [id number]  Chief Complaint  Patient presents with   Knee Pain    Pt states having bilateral knee pain but left is worse, x1 week.   Back Pain    Lower pain, no falls or injuries.     HPI Patient is in today for an office visit.   She complains of bilateral knee pain and swelling, worse in her left knee. She has a history of arthritis and a cyst in his left popliteal fossa. She previously visited a specialist and did PT which helped at the time. She regularly wears compression socks.   She also complains of back pain with associated throbbing beginning about 1.5 weeks ago. She states the pain radiates down her leg. She has been using Salonpas patches on her back to help. She tends to be very careful with where she sits to avoid triggering back pain. She denies any falls, injuries, or heavy lifting.  Past Medical History:  Diagnosis Date   Abdominal pain 05/07/2017   Abdominal pain, epigastric 02/28/2007   Qualifier: Diagnosis of  By: Blossom Hoops MD, Luis     Acute left-sided low back pain with left-sided sciatica 01/14/2015   Acute left-sided low back pain without sciatica 05/09/2017   ALLERGIC RHINITIS 05/15/2006   Qualifier: Diagnosis of  By: Daine Gip     Allergy    Arm numbness 03/22/2020   BACK PAIN 10/31/2006   Qualifier: Diagnosis of  By: Yetta Barre CMA, Chemira     Bilateral lower extremity edema 10/19/2019   Bronchitis 09/14/2021   Chronic pain of right knee 11/11/2018   Chronic venous insufficiency 01/13/2019   Controlled type 2 diabetes mellitus with chronic kidney disease, without long-term current use of insulin 09/20/2020   CTS (carpal tunnel syndrome) 03/17/2013   Diabetes mellitus without complication    no meds,  only check bs   Elevated CK 06/07/2021   Essential hypertension 11/05/2007   Qualifier: Diagnosis of  By: Janit Bern     Fatigue 09/20/2020   GAS/BLOATING 02/28/2007   Qualifier: Diagnosis of  By: Blossom Hoops MD, Luis     GASTROENTERITIS 02/28/2007   Qualifier: Diagnosis of  By: Blossom Hoops MD, Luis     Greater trochanteric bursitis of left hip 05/09/2017   Hyperglycemia 07/17/2012   Hyperlipidemia    Hyperlipidemia associated with type 2 diabetes mellitus 10/19/2019   HYPERTENSION, BENIGN ESSENTIAL 10/31/2006   Qualifier: Diagnosis of  By: Yetta Barre CMA, Chemira     KNEE PAIN 01/30/2007   Qualifier: Diagnosis of  By: Blossom Hoops MD, Luis     Lymphocytosis 11/06/2011   MURMUR 11/20/2007   Qualifier: Diagnosis of  By: Janit Bern     Neck pain 06/07/2021   Palpitations 09/20/2020   Primary hypertension 03/22/2020   SINUSITIS- ACUTE-NOS 04/02/2007   Qualifier: Diagnosis of  By: Blossom Hoops MD, Luis     Uncontrolled type 2 diabetes mellitus with hyperglycemia 01/30/2019   Urinary frequency 07/17/2012   Urinary incontinence 11/11/2018   Varicose 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 11/11/2018   Vitamin D deficiency 03/22/2020    Past Surgical History:  Procedure Laterality Date   UTERINE FIBROID SURGERY      Family History  Problem Relation  Age of Onset   Dementia Mother    Hypertension Father    Diabetes Father    Heart attack Father    Diabetes Sister        she had stomach pain-- cause unknown   Clotting disorder Brother    Colon cancer Neg Hx    Esophageal cancer Neg Hx    Stomach cancer Neg Hx    Rectal cancer Neg Hx     Social History   Socioeconomic History   Marital status: Divorced    Spouse name: Not on file   Number of children: Not on file   Years of education: Not on file   Highest education level: Not on file  Occupational History   Occupation: [employer]     Comment: [employer]  Tobacco Use   Smoking status: Never   Smokeless tobacco: Never   Vaping Use   Vaping Use: Never used  Substance and Sexual Activity   Alcohol use: No   Drug use: No   Sexual activity: Yes  Other Topics Concern   Not on file  Social History Narrative   Not on file   Social Determinants of Health   Financial Resource Strain: Not on file  Food Insecurity: Not on file  Transportation Needs: Not on file  Physical Activity: Not on file  Stress: Not on file  Social Connections: Not on file  Intimate Partner Violence: Not on file    Outpatient Medications Prior to Visit  Medication Sig Dispense Refill   amLODipine (NORVASC) 5 MG tablet Take 1 tablet (5 mg total) by mouth daily. 90 tablet 1   blood glucose meter kit and supplies KIT Check glucose daily 1 each 0   Dulaglutide (TRULICITY) 0.75 MG/0.5ML SOPN Inject 0.75 mg into the skin once a week. 2 mL 5   famotidine (PEPCID) 20 MG tablet Take 1 tablet (20 mg total) by mouth 2 (two) times daily. (Patient taking differently: Take 20 mg by mouth as needed for heartburn or indigestion.) 60 tablet 5   fluticasone (FLONASE) 50 MCG/ACT nasal spray Place 2 sprays into both nostrils daily. (Patient taking differently: Place 2 sprays into both nostrils as needed for allergies or rhinitis.) 16 g 6   loratadine (CLARITIN) 10 MG tablet Take 1 tablet (10 mg total) by mouth daily. (Patient taking differently: Take 10 mg by mouth as needed for allergies or rhinitis.) 30 tablet 11   multivitamin-iron-minerals-folic acid (CENTRUM) chewable tablet Chew 1 tablet by mouth daily.     NONFORMULARY OR COMPOUNDED ITEM Compression stocking -- thigh high  20-30 mm/hg #1  Dx varicose veint 1 each 0   olmesartan (BENICAR) 40 MG tablet Take 1 tablet (40 mg total) by mouth daily. 90 tablet 1   rosuvastatin (CRESTOR) 5 MG tablet Take 1 tablet by mouth , MWF 90 tablet 2   No facility-administered medications prior to visit.    Allergies  Allergen Reactions   Metformin And Related    Pravachol [Pravastatin Sodium] Other (See  Comments)    Muscle aches   Simvastatin     "leg pains"    Review of Systems  Constitutional:  Negative for fever and malaise/fatigue.  HENT:  Negative for congestion.   Eyes:  Negative for blurred vision.  Respiratory:  Negative for shortness of breath.   Cardiovascular:  Negative for chest pain, palpitations and leg swelling.  Gastrointestinal:  Negative for abdominal pain, blood in stool and nausea.  Genitourinary:  Negative for dysuria and  frequency.  Musculoskeletal:  Positive for back pain (lower back) and joint pain (knee pain - left knee worse). Negative for falls.  Skin:  Negative for rash.  Neurological:  Negative for dizziness, loss of consciousness and headaches.  Endo/Heme/Allergies:  Negative for environmental allergies.  Psychiatric/Behavioral:  Negative for depression. The patient is not nervous/anxious.        Objective:    Physical Exam Vitals and nursing note reviewed.  Constitutional:      Appearance: She is not ill-appearing.  HENT:     Nose: Nose normal.  Cardiovascular:     Rate and Rhythm: Normal rate.  Pulmonary:     Effort: Pulmonary effort is normal.  Musculoskeletal:        General: Swelling and tenderness present. No deformity or signs of injury. Normal range of motion.     Cervical back: Normal range of motion and neck supple.  Neurological:     General: No focal deficit present.     Mental Status: She is alert and oriented to person, place, and time.     Sensory: No sensory deficit.     Motor: No weakness.     Gait: Gait normal.     Deep Tendon Reflexes: Reflexes normal.  Psychiatric:        Mood and Affect: Mood normal.     BP 118/80 (BP Location: Left Arm, Patient Position: Sitting, Cuff Size: Normal)   Pulse 78   Temp 99 F (37.2 C) (Oral)   Resp 18   Ht  (1.727 m)   Wt 191 lb 9.6 oz (86.9 kg)   SpO2 99%   BMI 29.13 kg/m  Wt Readings from Last 3 Encounters:  08/10/22 191 lb 9.6 oz (86.9 kg)  05/04/22 191 lb 12.8 oz  (87 kg)  02/27/22 189 lb 9.6 oz (86 kg)       Assessment & Plan:  Bulging lumbar disc Assessment & Plan: Good strength lower extremities Muscle relaxer ---  use salon paas patch Con't tylenol as needed  PT referral placed   Orders: -     Ambulatory referral to Physical Therapy -     Cyclobenzaprine HCl; Take 1 tablet (5 mg total) by mouth 3 (three) times daily as needed for muscle spasms.  Dispense: 30 tablet; Refill: 1  Arthritis of both knees -     Ambulatory referral to Physical Therapy -     Cyclobenzaprine HCl; Take 1 tablet (5 mg total) by mouth 3 (three) times daily as needed for muscle spasms.  Dispense: 30 tablet; Refill: 1  Primary hypertension -     Lipid panel -     CBC with Differential/Platelet -     Comprehensive metabolic panel  Uncontrolled type 2 diabetes mellitus with hyperglycemia Assessment & Plan: [id number] to be checked minimize simple carbs. Increase exercise as tolerated. Continue current meds   Orders: -     CBC with Differential/Platelet -     Comprehensive metabolic panel -     Hemoglobin A1c  Hyperlipidemia associated with type 2 diabetes mellitus -     Lipid panel -     CBC with Differential/Platelet -     Comprehensive metabolic panel  Essential hypertension Assessment & Plan: Well controlled, no changes to meds. Encouraged heart healthy diet such as the DASH diet and exercise as tolerated.     Hyperlipidemia, unspecified hyperlipidemia type Assessment & Plan: Tolerating statin, encouraged heart healthy diet, avoid trans fats, minimize simple carbs and saturated fats. Increase  exercise as tolerated       I,Rachel Rivera,acting as a scribe for Donato Schultz, DO.,have documented all relevant documentation on the behalf of Donato Schultz, DO,as directed by  Donato Schultz, DO while in the presence of Donato Schultz, DO.   I, Donato Schultz, DO, personally preformed the services described in this  documentation.  All medical record entries made by the scribe were at my direction and in my presence.  I have reviewed the chart and discharge instructions (if applicable) and agree that the record reflects my personal performance and is accurate and complete. 08/10/22   Donato Schultz, DO

## 2022-08-10 NOTE — Assessment & Plan Note (Signed)
hgba1c to be checked minimize simple carbs. Increase exercise as tolerated. Continue current meds 

## 2022-08-14 NOTE — Therapy (Unsigned)
OUTPATIENT PHYSICAL THERAPY THORACOLUMBAR EVALUATION   Patient Name: Angela Garner MRN: [id number] DOB:16-Jul-1960, 62 y.o., female Today's Date: 08/15/2022  END OF SESSION:  PT End of Session - 08/15/22 1542     Visit Number 1    Date for PT Re-Evaluation 11/07/22    PT Start Time 1542    PT Stop Time 1620    PT Time Calculation (min) 38 min             Past Medical History:  Diagnosis Date   Abdominal pain 05/07/2017   Abdominal pain, epigastric 02/28/2007   Qualifier: Diagnosis of  By: Blossom Hoops MD, Luis     Acute left-sided low back pain with left-sided sciatica 01/14/2015   Acute left-sided low back pain without sciatica 05/09/2017   ALLERGIC RHINITIS 05/15/2006   Qualifier: Diagnosis of  By: Daine Gip     Allergy    Arm numbness 03/22/2020   BACK PAIN 10/31/2006   Qualifier: Diagnosis of  By: Yetta Barre CMA, Chemira     Bilateral lower extremity edema 10/19/2019   Bronchitis 09/14/2021   Chronic pain of right knee 11/11/2018   Chronic venous insufficiency 01/13/2019   Controlled type 2 diabetes mellitus with chronic kidney disease, without long-term current use of insulin 09/20/2020   CTS (carpal tunnel syndrome) 03/17/2013   Diabetes mellitus without complication    no meds, only check bs   Elevated CK 06/07/2021   Essential hypertension 11/05/2007   Qualifier: Diagnosis of  By: Janit Bern     Fatigue 09/20/2020   GAS/BLOATING 02/28/2007   Qualifier: Diagnosis of  By: Blossom Hoops MD, Luis     GASTROENTERITIS 02/28/2007   Qualifier: Diagnosis of  By: Blossom Hoops MD, Luis     Greater trochanteric bursitis of left hip 05/09/2017   Hyperglycemia 07/17/2012   Hyperlipidemia    Hyperlipidemia associated with type 2 diabetes mellitus 10/19/2019   HYPERTENSION, BENIGN ESSENTIAL 10/31/2006   Qualifier: Diagnosis of  By: Yetta Barre CMA, Chemira     KNEE PAIN 01/30/2007   Qualifier: Diagnosis of  By: Blossom Hoops MD, Luis     Lymphocytosis 11/06/2011   MURMUR 11/20/2007   Qualifier:  Diagnosis of  By: Janit Bern     Neck pain 06/07/2021   Palpitations 09/20/2020   Primary hypertension 03/22/2020   SINUSITIS- ACUTE-NOS 04/02/2007   Qualifier: Diagnosis of  By: Blossom Hoops MD, Luis     Uncontrolled type 2 diabetes mellitus with hyperglycemia 01/30/2019   Urinary frequency 07/17/2012   Urinary incontinence 11/11/2018   Varicose 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 11/11/2018   Vitamin D deficiency 03/22/2020   Past Surgical History:  Procedure Laterality Date   UTERINE FIBROID SURGERY     Patient Active Problem List   Diagnosis Date Noted   Bulging lumbar disc 08/10/2022   Arthritis of both knees 08/10/2022   Sore throat 02/27/2022   Travel advice encounter 01/18/2022   Allergy 11/29/2021   Diabetes mellitus without complication 11/29/2021   Bronchitis 09/14/2021   Elevated CK 06/07/2021   Neck pain 06/07/2021   Controlled type 2 diabetes mellitus with chronic kidney disease, without long-term current use of insulin 09/20/2020   Fatigue 09/20/2020   Palpitations 09/20/2020   Arm numbness 03/22/2020   Primary hypertension 03/22/2020   Vitamin D deficiency 03/22/2020   Hyperlipidemia associated with type 2 diabetes mellitus 10/19/2019   Bilateral lower extremity edema 10/19/2019   Uncontrolled type 2 diabetes mellitus with hyperglycemia 01/30/2019   Hyperlipidemia    Chronic  venous insufficiency 01/13/2019   Varicose 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 11/11/2018   Chronic pain of right knee 11/11/2018   Urinary incontinence 11/11/2018   Greater trochanteric bursitis of left hip 05/09/2017   Acute left-sided low back pain without sciatica 05/09/2017   Abdominal pain 05/07/2017   Acute left-sided low back pain with left-sided sciatica 01/14/2015   CTS (carpal tunnel syndrome) 03/17/2013   Hyperglycemia 07/17/2012   Urinary frequency 07/17/2012   Lymphocytosis 11/06/2011   MURMUR 11/20/2007   Essential hypertension 11/05/2007   SINUSITIS-  ACUTE-NOS 04/02/2007   GASTROENTERITIS 02/28/2007   GAS/BLOATING 02/28/2007   ABDOMINAL PAIN, EPIGASTRIC 02/28/2007   KNEE PAIN 01/30/2007   HYPERTENSION, BENIGN ESSENTIAL 10/31/2006   BACK PAIN 10/31/2006   ALLERGIC RHINITIS 05/15/2006    PCP: Donato Schultz, DO  REFERRING PROVIDER: Donato Schultz, DO  REFERRING DIAG:  M51.36 (ICD-10-CM) - Bulging lumbar disc  M17.0 (ICD-10-CM) - Arthritis of both knees    Rationale for Evaluation and Treatment: Rehabilitation  THERAPY DIAG:  Difficulty in walking, not elsewhere classified  Muscle weakness (generalized)  Acute left-sided low back pain with left-sided sciatica  Bilateral chronic knee pain  ONSET DATE: 08/10/22  SUBJECTIVE:                                                                                                                                                                                           SUBJECTIVE STATEMENT: Patient reports she has had a lot of stress with 4 people passing in 1 week. She has a bulging disc in her back, diagnosed about 4 years ago. She participated in PT and has done well until recently. She also has B knee pain due to arthritis.  PERTINENT HISTORY:  DM2, HTN,  PAIN:  Are you having pain? Yes: NPRS scale: 9/10 Pain location: L side lower back, can radiate into the leg Pain description: sharp,radiates into entire leg at its worst. Aggravating factors: stress, sitting, posture Relieving factors: Salon Passe and Tylenol have helped some.  PRECAUTIONS: None  WEIGHT BEARING RESTRICTIONS: No  FALLS:  Has patient fallen in last 6 months? 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  PLOF: Independent  PATIENT  GOALS: Pain relief, avoid surgery  NEXT MD VISIT: unknown  OBJECTIVE:   DIAGNOSTIC FINDINGS:  05/28/17 lumbar MRI IMPRESSION: Extruded disc fragment on the left L2-3  with impingement of the left L2 nerve root. Mild spinal stenosis   Mild spinal stenosis and mild foraminal stenosis bilaterally L4-5.   Mild subarticular stenosis bilaterally L5-S1.  SCREENING FOR RED FLAGS: Bowel or bladder incontinence: No Spinal tumors: No Cauda equina syndrome: No Compression fracture: No Abdominal aneurysm: No  COGNITION: Overall cognitive status: Within functional limits for tasks assessed     SENSATION: WFL  MUSCLE LENGTH: Hamstrings: WNL, near 90 Thomas test: Mildly tight on L  POSTURE: decreased lumbar lordosis, decreased thoracic kyphosis, and right pelvic obliquity  PALPATION: TTP L QL.  LUMBAR ROM:   AROM eval  Flexion To toes  Extension WNL  Right lateral flexion 3" above knee  Left lateral flexion 3" above knee  Right rotation 80%  Left rotation 80%   (Blank rows = not tested)  LOWER EXTREMITY ROM:   B LE ROM WNL except L hip ER and IR slightly impeded.  LOWER EXTREMITY MMT:  BLE 5/5 except where noted.  MMT Right eval Left eval  Hip flexion  4  Hip extension 4- 4-  Hip abduction  4-  Hip adduction    Hip internal rotation    Hip external rotation    Knee flexion    Knee extension  4  Ankle dorsiflexion    Ankle plantarflexion    Ankle inversion    Ankle eversion     (Blank rows = not tested)  LUMBAR SPECIAL TESTS:  Straight leg raise test: Negative and Slump test: Negative  FUNCTIONAL TESTS:  Berg Balance Scale: TBD  GAIT: Distance walked: In clinic distances Assistive device utilized: None Level of assistance: Complete Independence Comments: Patient moves slowly, antalgic gait noted.  TODAY'S TREATMENT:                                                                                                                              DATE:  08/15/22   Education- Has HEP, SKTC, figure 4, cobra, bridge, side lying hip abd   PATIENT EDUCATION:  Education details: POC, reviewed her current HEP Person educated: Patient Education method: Medical illustrator Education comprehension: verbalized understanding and returned demonstration  HOME EXERCISE PROGRAM: Has HEP from previous therapy for herniated disc, SKTC, figure 4, cobra, bridge, side lying hip abd  ASSESSMENT:  CLINICAL IMPRESSION: Patient is a 62 y.o. who was seen today for physical therapy evaluation and treatment for LBP. She had a lumbar disc herniation in 2019- received PT at that time. She also has B knee arthritis. Both issues have exacerbated recently. In addition she has lost 4 family members/friends in the past week, with severe stress contributing to there pain levels. She demonstrates mild weakness in hips and trunk, TTP on L QL. She has some exercises she has started to perform again from a previous Hep, see HEP section. She will benefit from PT to update her HEP to more thoroughly address her pain, tightness, and weakness, and for STM and  acute treatment for the pain in her lower back in order to return to her previous activity level without pain.  OBJECTIVE IMPAIRMENTS: Abnormal gait, decreased activity tolerance, decreased balance, decreased endurance, decreased mobility, difficulty walking, decreased ROM, decreased strength, impaired flexibility, improper body mechanics, postural dysfunction, and pain.   ACTIVITY LIMITATIONS: carrying, lifting, bending, sitting, stairs, and locomotion level  PARTICIPATION LIMITATIONS: meal prep, cleaning, laundry, shopping, community activity, and occupation  PERSONAL FACTORS: Age and Past/current experiences are also affecting patient's functional outcome.   REHAB POTENTIAL: Good  CLINICAL DECISION MAKING: Stable/uncomplicated  EVALUATION COMPLEXITY: Moderate   GOALS: Goals reviewed with patient? Yes  SHORT TERM  GOALS: Target date: 09/02/22  I with initial HEP Baseline: Goal status: INITIAL  LONG TERM GOALS: Target date: 11/07/22  I with final HEP Baseline:  Goal status: INITIAL  2.  Patient will score at least 44 on BERG Baseline:  Goal status: INITIAL  3.  Increase B hip strength to at least 4+/5 Baseline: 4- Goal status: INITIAL  4.  Patient will report 75% improvement in her back pain with a max <3/10. Baseline:  Goal status: INITIAL  5.  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  PLAN FOR NEXT SESSION: STM to L QL, update HEP with strengthening/postural stability exercises.   Iona Beard, DPT 08/15/2022, 6:10 PM

## 2022-08-15 ENCOUNTER — Encounter: Payer: Self-pay | Admitting: Physical Therapy

## 2022-08-15 ENCOUNTER — Ambulatory Visit: Payer: 59 | Attending: Family Medicine | Admitting: Physical Therapy

## 2022-08-15 ENCOUNTER — Other Ambulatory Visit: Payer: Self-pay | Admitting: Family Medicine

## 2022-08-15 DIAGNOSIS — M170 Bilateral primary osteoarthritis of knee: Secondary | ICD-10-CM | POA: Insufficient documentation

## 2022-08-15 DIAGNOSIS — M6281 Muscle weakness (generalized): Secondary | ICD-10-CM | POA: Diagnosis not present

## 2022-08-15 DIAGNOSIS — M5442 Lumbago with sciatica, left side: Secondary | ICD-10-CM | POA: Diagnosis not present

## 2022-08-15 DIAGNOSIS — M5136 Other intervertebral disc degeneration, lumbar region: Secondary | ICD-10-CM | POA: Diagnosis not present

## 2022-08-15 DIAGNOSIS — R262 Difficulty in walking, not elsewhere classified: Secondary | ICD-10-CM | POA: Insufficient documentation

## 2022-08-15 DIAGNOSIS — M25562 Pain in left knee: Secondary | ICD-10-CM | POA: Diagnosis not present

## 2022-08-15 DIAGNOSIS — M25561 Pain in right knee: Secondary | ICD-10-CM | POA: Insufficient documentation

## 2022-08-15 DIAGNOSIS — E1169 Type 2 diabetes mellitus with other specified complication: Secondary | ICD-10-CM

## 2022-08-15 DIAGNOSIS — G8929 Other chronic pain: Secondary | ICD-10-CM | POA: Insufficient documentation

## 2022-08-17 ENCOUNTER — Other Ambulatory Visit: Payer: Self-pay

## 2022-08-17 ENCOUNTER — Encounter: Payer: Self-pay | Admitting: Family Medicine

## 2022-08-17 ENCOUNTER — Ambulatory Visit (INDEPENDENT_AMBULATORY_CARE_PROVIDER_SITE_OTHER): Payer: 59 | Admitting: Family Medicine

## 2022-08-17 ENCOUNTER — Other Ambulatory Visit: Payer: Self-pay | Admitting: Family Medicine

## 2022-08-17 ENCOUNTER — Telehealth: Payer: Self-pay | Admitting: Family Medicine

## 2022-08-17 VITALS — BP 140/90 | HR 94 | Temp 98.9°F | Resp 18 | Ht 68.0 in | Wt 195.8 lb

## 2022-08-17 DIAGNOSIS — U071 COVID-19: Secondary | ICD-10-CM

## 2022-08-17 DIAGNOSIS — E785 Hyperlipidemia, unspecified: Secondary | ICD-10-CM

## 2022-08-17 DIAGNOSIS — R051 Acute cough: Secondary | ICD-10-CM | POA: Diagnosis not present

## 2022-08-17 DIAGNOSIS — T7840XA Allergy, unspecified, initial encounter: Secondary | ICD-10-CM

## 2022-08-17 LAB — POC INFLUENZA A&B (BINAX/QUICKVUE)
Influenza A, POC: NEGATIVE
Influenza B, POC: NEGATIVE

## 2022-08-17 LAB — POC COVID19 BINAXNOW: SARS Coronavirus 2 Ag: POSITIVE — AB

## 2022-08-17 MED ORDER — ROSUVASTATIN CALCIUM 5 MG PO TABS
ORAL_TABLET | ORAL | 1 refills | Status: DC
Start: 2022-08-17 — End: 2023-02-11

## 2022-08-17 MED ORDER — PROMETHAZINE-DM 6.25-15 MG/5ML PO SYRP
5.0000 mL | ORAL_SOLUTION | Freq: Four times a day (QID) | ORAL | 0 refills | Status: DC | PRN
Start: 2022-08-17 — End: 2022-11-19

## 2022-08-17 MED ORDER — LEVOCETIRIZINE DIHYDROCHLORIDE 5 MG PO TABS
5.0000 mg | ORAL_TABLET | Freq: Every evening | ORAL | 5 refills | Status: DC
Start: 2022-08-17 — End: 2023-02-11

## 2022-08-17 MED ORDER — NIRMATRELVIR/RITONAVIR (PAXLOVID)TABLET
3.0000 | ORAL_TABLET | Freq: Two times a day (BID) | ORAL | 0 refills | Status: AC
Start: 2022-08-17 — End: 2022-08-22

## 2022-08-17 NOTE — Telephone Encounter (Signed)
Pt called. LVM advising medication was sent

## 2022-08-17 NOTE — Telephone Encounter (Signed)
Patient states she forgot to mention to [facility] that she would like allergy pills prescribed for her. Please advise.

## 2022-08-17 NOTE — Patient Instructions (Signed)
COVID-19 COVID-19 is an infection caused by a virus called SARS-CoV-2. Most people who get COVID-19 have mild to moderate symptoms. Some have little to no symptoms. In others, the virus may cause a severe infection. What are the causes? COVID-19 is caused by a coronavirus. The virus may be in the air as droplets or as tiny specks of fluid (aerosols). It may also be on surfaces. You may catch the virus if you: Breathe in droplets when a person with COVID-19 breathes, speaks, sings, coughs, or sneezes. Touch something that has the virus on it and then touch your mouth, nose, or eyes. What increases the risk? Risk for infection: You are more likely to get COVID-19 if: You are within 6 ft (1.8 m) of a person who has COVID-19 for 15 minutes or longer. You provide care to a person who has COVID-19. You are in close contact with others. This includes hugging, kissing, or sharing utensils. Risk for serious illness caused by COVID-19: You are more likely to get very ill from COVID-19 if: You have cancer. You have a long-term (chronic) disease. This may be: A chronic lung disease, such as pulmonary embolism, chronic obstructive pulmonary disease (COPD), or cystic fibrosis. A disease that affects your body's defense system (immune system). If you have a weak immune system, you are said to be immunocompromised. A serious heart condition, such as heart failure, coronary artery disease, or cardiomyopathy. Diabetes. Chronic kidney disease. A liver disease, such as cirrhosis, nonalcoholic fatty liver disease, alcoholic liver disease, or autoimmune hepatitis. You are obese. You are pregnant or were just pregnant. You have sickle cell disease. What are the signs or symptoms? Symptoms of COVID-19 can range from mild to severe. They may appear any time from 2 to 14 days after you are exposed. They include: Fever or chills. Shortness of breath or trouble breathing. Feeling tired. Headaches, body aches, or  muscle aches. A runny or stuffy nose. Sneezing, coughing, or a sore throat. New loss of taste or smell. You may also have stomach problems, such as nausea, vomiting, or diarrhea. In some cases, you may not have any symptoms. How is this diagnosed? COVID-19 may be diagnosed by testing a sample to check for the virus. The most common tests are the PCR test and the antigen test. Tests may be done in the lab or at home. They include: Using a swab to take a sample of fluid from your nose. Testing a sample of saliva from your mouth. Testing a sample of mucus from your lungs (sputum). How is this treated? Treatment for COVID-19 depends on how severe your condition is. Mild symptoms can be treated at home. You should rest, drink fluids, and take over-the-counter medicine. If you have symptoms and risk factors, you may be prescribed a medicine that fights viruses (antiviral). Severe symptoms may be treated in a hospital intensive care unit (ICU). Treatment may include: Extra oxygen given through a tube in the nose, a face mask, or a hood. Medicines. These may include: Antivirals, such as remdesivir. Anti-inflammatories, such as corticosteroids. These help reduce inflammation. Antithrombotics. These help prevent or treat blood clots. Convalescent plasma. This helps boost your immune system. Prone positioning. This is when you are laid on your stomach to help oxygen get into your lungs. Infection control measures. If you are at risk for a more serious illness, your health care provider may prescribe two medicines to help your immune system protect you. These are called long-acting monoclonal antibodies. They are given together   every 6 months. How is this prevented? To protect yourself: Get the vaccine or vaccine series if you meet the guidelines. You can even get the vaccine while you are pregnant or making breast milk (lactating). Get an added dose of the vaccine if you are immunocompromised. This  applies if you have had an organ transplant or if you have a condition that affects your immune system. You should get the added dose 4 weeks after you got the first one. If you get an mRNA vaccine, you will need to get 3 doses. Talk to your provider about getting experimental monoclonal antibodies. This treatment can help prevent severe illness. It may be given to you if: You are immunocompromised. You cannot get the vaccine. You may not get the vaccine if you have a severe allergic reaction to it or to what it is made of. You are not fully vaccinated. You are in a place where there is COVID-19 and: You are in close contact with someone who has COVID-19. You are at high risk of being exposed. You are at risk of illness from new variants of the virus. To protect others: If you have symptoms of COVID-19, take steps to stop the virus from spreading. Stay home. Leave your house only to get medical care. Do not use public transit. Do not travel while you are sick. Wash your hands often with soap and water for at least 20 seconds. If soap and water are not available, use alcohol-based hand sanitizer. Make sure that all people in your household wash their hands well and often. Cough or sneeze into a tissue or your sleeve or elbow. Do not cough or sneeze into your hand or into the air. Where to find more information Centers for Disease Control and Prevention (CDC): cdc.gov World Health Organization (WHO): who.int Get help right away if: You have trouble breathing. You have pain or pressure in your chest. You are confused. Your lips or fingernails turn blue. You have trouble waking from sleep. Your symptoms get worse. These symptoms may be an emergency. Get help right away. Call 911. Do not wait to see if the symptoms will go away. Do not drive yourself to the hospital. This information is not intended to replace advice given to you by your health care provider. Make sure you discuss any  questions you have with your health care provider. Document Revised: 12/22/2021 Document Reviewed: 12/22/2021 Elsevier Patient Education  2023 Elsevier Inc.  

## 2022-08-17 NOTE — Progress Notes (Signed)
Established Patient Office Visit  Subjective   Patient ID: Angela Garner, female    DOB: 1960/09/23  Age: 62 y.o. MRN: [id number]  Chief Complaint  Patient presents with   Cough    Cough started yesterday, pt states feeling "yucky" and unable to sleep, and having chills. No COVID test    HPI Pt here c/o chills, and cough--- unable to cough anything up.    No known fever but she has been taking tylenol regularly.   No NVD,  + sore throat , no sob Patient Active Problem List   Diagnosis Date Noted   Bulging lumbar disc 08/10/2022   Arthritis of both knees 08/10/2022   Sore throat 02/27/2022   Travel advice encounter 01/18/2022   Allergy 11/29/2021   Diabetes mellitus without complication (HCC) 11/29/2021   Bronchitis 09/14/2021   Elevated CK 06/07/2021   Neck pain 06/07/2021   Controlled type 2 diabetes mellitus with chronic kidney disease, without long-term current use of insulin (HCC) 09/20/2020   Fatigue 09/20/2020   Palpitations 09/20/2020   Arm numbness 03/22/2020   Primary hypertension 03/22/2020   Vitamin D deficiency 03/22/2020   Hyperlipidemia associated with type 2 diabetes mellitus (HCC) 10/19/2019   Bilateral lower extremity edema 10/19/2019   Uncontrolled type 2 diabetes mellitus with hyperglycemia (HCC) 01/30/2019   Hyperlipidemia    Chronic venous insufficiency 01/13/2019   Varicose 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 11/11/2018   Chronic pain of right knee 11/11/2018   Urinary incontinence 11/11/2018   Greater trochanteric bursitis of left hip 05/09/2017   Acute left-sided low back pain without sciatica 05/09/2017   Abdominal pain 05/07/2017   Acute left-sided low back pain with left-sided sciatica 01/14/2015   CTS (carpal tunnel syndrome) 03/17/2013   Hyperglycemia 07/17/2012   Urinary frequency 07/17/2012   Lymphocytosis 11/06/2011   MURMUR 11/20/2007   Essential hypertension 11/05/2007   SINUSITIS- ACUTE-NOS 04/02/2007   GASTROENTERITIS  02/28/2007   GAS/BLOATING 02/28/2007   ABDOMINAL PAIN, EPIGASTRIC 02/28/2007   KNEE PAIN 01/30/2007   HYPERTENSION, BENIGN ESSENTIAL 10/31/2006   BACK PAIN 10/31/2006   ALLERGIC RHINITIS 05/15/2006   Past Medical History:  Diagnosis Date   Abdominal pain 05/07/2017   Abdominal pain, epigastric 02/28/2007   Qualifier: Diagnosis of  By: Blossom Hoops MD, Luis     Acute left-sided low back pain with left-sided sciatica 01/14/2015   Acute left-sided low back pain without sciatica 05/09/2017   ALLERGIC RHINITIS 05/15/2006   Qualifier: Diagnosis of  By: Daine Gip     Allergy    Arm numbness 03/22/2020   BACK PAIN 10/31/2006   Qualifier: Diagnosis of  By: Yetta Barre CMA, Chemira     Bilateral lower extremity edema 10/19/2019   Bronchitis 09/14/2021   Chronic pain of right knee 11/11/2018   Chronic venous insufficiency 01/13/2019   Controlled type 2 diabetes mellitus with chronic kidney disease, without long-term current use of insulin (HCC) 09/20/2020   CTS (carpal tunnel syndrome) 03/17/2013   Diabetes mellitus without complication (HCC)    no meds, only check bs   Elevated CK 06/07/2021   Essential hypertension 11/05/2007   Qualifier: Diagnosis of  By: Janit Bern     Fatigue 09/20/2020   GAS/BLOATING 02/28/2007   Qualifier: Diagnosis of  By: Blossom Hoops MD, Luis     GASTROENTERITIS 02/28/2007   Qualifier: Diagnosis of  By: Blossom Hoops MD, Luis     Greater trochanteric bursitis of left hip 05/09/2017   Hyperglycemia 07/17/2012   Hyperlipidemia  Hyperlipidemia associated with type 2 diabetes mellitus (HCC) 10/19/2019   HYPERTENSION, BENIGN ESSENTIAL 10/31/2006   Qualifier: Diagnosis of  By: Yetta Barre CMA, Chemira     KNEE PAIN 01/30/2007   Qualifier: Diagnosis of  By: Blossom Hoops MD, Luis     Lymphocytosis 11/06/2011   MURMUR 11/20/2007   Qualifier: Diagnosis of  By: Janit Bern     Neck pain 06/07/2021   Palpitations 09/20/2020   Primary hypertension 03/22/2020   SINUSITIS- ACUTE-NOS 04/02/2007    Qualifier: Diagnosis of  By: Blossom Hoops MD, Luis     Uncontrolled type 2 diabetes mellitus with hyperglycemia (HCC) 01/30/2019   Urinary frequency 07/17/2012   Urinary incontinence 11/11/2018   Varicose 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 11/11/2018   Vitamin D deficiency 03/22/2020   Past Surgical History:  Procedure Laterality Date   UTERINE FIBROID SURGERY     Social History   Tobacco Use   Smoking status: Never   Smokeless tobacco: Never  Vaping Use   Vaping Use: Never used  Substance Use Topics   Alcohol use: No   Drug use: No   Social History   Socioeconomic History   Marital status: Divorced    Spouse name: Not on file   Number of children: Not on file   Years of education: Not on file   Highest education level: Not on file  Occupational History   Occupation: [employer]     Comment: [employer]  Tobacco Use   Smoking status: Never   Smokeless tobacco: Never  Vaping Use   Vaping Use: Never used  Substance and Sexual Activity   Alcohol use: No   Drug use: No   Sexual activity: Yes  Other Topics Concern   Not on file  Social History Narrative   Not on file   Social Determinants of Health   Financial Resource Strain: Not on file  Food Insecurity: Not on file  Transportation Needs: Not on file  Physical Activity: Not on file  Stress: Not on file  Social Connections: Not on file  Intimate Partner Violence: Not on file   Family Status  Relation Name Status   Mother  Deceased   Father  Deceased at age 63       [place]   Sister  Alive   Sister  Deceased at age 90   Brother  Alive   Brother  Alive   Brother  Alive   Brother  Alive   Neg Hx  (Not Specified)   Family History  Problem Relation Age of Onset   Dementia Mother    Hypertension Father    Diabetes Father    Heart attack Father    Diabetes Sister        she had stomach pain-- cause unknown   Clotting disorder Brother    Colon cancer Neg Hx    Esophageal cancer Neg Hx    Stomach  cancer Neg Hx    Rectal cancer Neg Hx    Allergies  Allergen Reactions   Metformin And Related    Pravachol [Pravastatin Sodium] Other (See Comments)    Muscle aches   Simvastatin     "leg pains"      Review of Systems  Constitutional:  Positive for chills and malaise/fatigue. Negative for fever.  HENT:  Negative for congestion.   Eyes:  Negative for blurred vision.  Respiratory:  Positive for cough and sputum production. Negative for shortness of breath.   Cardiovascular:  Negative for  chest pain, palpitations and leg swelling.  Gastrointestinal:  Negative for abdominal pain, blood in stool and nausea.  Genitourinary:  Negative for dysuria and frequency.  Musculoskeletal:  Negative for falls.  Skin:  Negative for rash.  Neurological:  Negative for dizziness, loss of consciousness and headaches.  Endo/Heme/Allergies:  Negative for environmental allergies.  Psychiatric/Behavioral:  Negative for depression. The patient is not nervous/anxious.       Objective:     BP (!) 140/90 (BP Location: Left Arm, Patient Position: Sitting, Cuff Size: Normal)   Pulse 94   Temp 98.9 F (37.2 C) (Oral)   Resp 18   Ht 5\' 8"  (1.727 m)   Wt 195 lb 12.8 oz (88.8 kg)   SpO2 98%   BMI 29.77 kg/m  BP Readings from Last 3 Encounters:  08/17/22 (!) 140/90  08/10/22 118/80  05/04/22 132/82   Wt Readings from Last 3 Encounters:  08/17/22 195 lb 12.8 oz (88.8 kg)  08/10/22 191 lb 9.6 oz (86.9 kg)  05/04/22 191 lb 12.8 oz (87 kg)   SpO2 Readings from Last 3 Encounters:  08/17/22 98%  08/10/22 99%  05/04/22 97%      Physical Exam Vitals and nursing note reviewed.  Constitutional:      Appearance: She is well-developed.  HENT:     Head: Normocephalic and atraumatic.  Eyes:     Conjunctiva/sclera: Conjunctivae normal.  Neck:     Thyroid: No thyromegaly.     Vascular: No carotid bruit or JVD.  Cardiovascular:     Rate and Rhythm: Normal rate and regular rhythm.     Heart sounds:  Normal heart sounds. No murmur heard. Pulmonary:     Effort: Pulmonary effort is normal. No respiratory distress.     Breath sounds: Normal breath sounds. No wheezing or rales.  Chest:     Chest wall: No tenderness.  Musculoskeletal:     Cervical back: Normal range of motion and neck supple.  Neurological:     Mental Status: She is alert and oriented to person, place, and time.      Results for orders placed or performed in visit on 08/17/22  POC COVID-19  Result Value Ref Range   SARS Coronavirus 2 Ag Positive (A) Negative  POC Influenza A&B (Binax test)  Result Value Ref Range   Influenza A, POC Negative Negative   Influenza B, POC Negative Negative    Last CBC Lab Results  Component Value Date   WBC 5.0 08/10/2022   HGB 14.9 08/10/2022   HCT 44.4 08/10/2022   MCV 93.3 08/10/2022   MCH 32.3 01/30/2019   RDW 13.9 08/10/2022   PLT 189.0 08/10/2022   Last metabolic panel Lab Results  Component Value Date   GLUCOSE 96 08/10/2022   NA 140 08/10/2022   K 3.7 08/10/2022   CL 102 08/10/2022   CO2 28 08/10/2022   BUN 14 08/10/2022   CREATININE 0.83 08/10/2022   CALCIUM 9.6 08/10/2022   PROT 7.4 08/10/2022   ALBUMIN 4.5 08/10/2022   BILITOT 0.8 08/10/2022   ALKPHOS 69 08/10/2022   AST 28 08/10/2022   ALT 21 08/10/2022   Last lipids Lab Results  Component Value Date   CHOL 210 (H) 08/10/2022   HDL 80.40 08/10/2022   LDLCALC 119 (H) 08/10/2022   LDLDIRECT 120.9 08/02/2011   TRIG 50.0 08/10/2022   CHOLHDL 3 08/10/2022   Last hemoglobin A1c Lab Results  Component Value Date   HGBA1C 6.1 08/10/2022   Last  thyroid functions Lab Results  Component Value Date   TSH 2.98 09/20/2020   T4TOTAL 7.5 01/30/2019   Last vitamin D Lab Results  Component Value Date   VD25OH 41.20 09/20/2020   Last vitamin B12 and Folate Lab Results  Component Value Date   VITAMINB12 637 09/20/2020   FOLATE >24.0 01/30/2019      The 10-year ASCVD risk score (Arnett DK, et  al., 2019) is: 19.5%    Assessment & Plan:   Problem List Items Addressed This Visit   None Visit Diagnoses     COVID-19    -  Primary   Relevant Medications   nirmatrelvir/ritonavir (PAXLOVID) 20 x 150 MG & 10 x 100MG  TABS   Acute cough       Relevant Medications   promethazine-dextromethorphan (PROMETHAZINE-DM) 6.25-15 MG/5ML syrup   Other Relevant Orders   POC COVID-19 (Completed)   POC Influenza A&B (Binax test) (Completed)     Quarantine for 5 days completely and then she can leave house with mask on for 5 more days  Return to office as needed   Return if symptoms worsen or fail to improve.    Donato Schultz, DO

## 2022-08-24 ENCOUNTER — Ambulatory Visit: Payer: 59 | Admitting: Physical Therapy

## 2022-08-28 ENCOUNTER — Ambulatory Visit (INDEPENDENT_AMBULATORY_CARE_PROVIDER_SITE_OTHER): Payer: 59 | Admitting: Family Medicine

## 2022-08-28 ENCOUNTER — Ambulatory Visit: Payer: 59 | Attending: Family Medicine | Admitting: Physical Therapy

## 2022-08-28 ENCOUNTER — Encounter: Payer: Self-pay | Admitting: Family Medicine

## 2022-08-28 VITALS — BP 118/70 | HR 80 | Temp 98.5°F | Resp 18 | Ht 68.0 in | Wt 195.8 lb

## 2022-08-28 DIAGNOSIS — R143 Flatulence: Secondary | ICD-10-CM

## 2022-08-28 DIAGNOSIS — R1013 Epigastric pain: Secondary | ICD-10-CM | POA: Diagnosis not present

## 2022-08-28 DIAGNOSIS — M6281 Muscle weakness (generalized): Secondary | ICD-10-CM | POA: Diagnosis not present

## 2022-08-28 DIAGNOSIS — G8929 Other chronic pain: Secondary | ICD-10-CM | POA: Diagnosis not present

## 2022-08-28 DIAGNOSIS — R1011 Right upper quadrant pain: Secondary | ICD-10-CM | POA: Insufficient documentation

## 2022-08-28 DIAGNOSIS — I10 Essential (primary) hypertension: Secondary | ICD-10-CM | POA: Diagnosis not present

## 2022-08-28 DIAGNOSIS — R262 Difficulty in walking, not elsewhere classified: Secondary | ICD-10-CM

## 2022-08-28 DIAGNOSIS — R141 Gas pain: Secondary | ICD-10-CM

## 2022-08-28 DIAGNOSIS — M25561 Pain in right knee: Secondary | ICD-10-CM | POA: Insufficient documentation

## 2022-08-28 DIAGNOSIS — M25562 Pain in left knee: Secondary | ICD-10-CM | POA: Insufficient documentation

## 2022-08-28 DIAGNOSIS — N3946 Mixed incontinence: Secondary | ICD-10-CM

## 2022-08-28 DIAGNOSIS — M5442 Lumbago with sciatica, left side: Secondary | ICD-10-CM | POA: Insufficient documentation

## 2022-08-28 DIAGNOSIS — R142 Eructation: Secondary | ICD-10-CM

## 2022-08-28 MED ORDER — PANTOPRAZOLE SODIUM 40 MG PO TBEC
40.0000 mg | DELAYED_RELEASE_TABLET | Freq: Every day | ORAL | 3 refills | Status: DC
Start: 2022-08-28 — End: 2022-12-14

## 2022-08-28 MED ORDER — AMLODIPINE BESYLATE 2.5 MG PO TABS
2.5000 mg | ORAL_TABLET | Freq: Every day | ORAL | 0 refills | Status: DC
Start: 2022-08-28 — End: 2022-11-13

## 2022-08-28 NOTE — Therapy (Signed)
OUTPATIENT PHYSICAL THERAPY THORACOLUMBAR    Patient Name: Angela Garner MRN: [id number] DOB:May 30, 1960, 62 y.o., female Today's Date: 08/28/2022  END OF SESSION:  PT End of Session - 08/28/22 1147     Visit Number 2    Date for PT Re-Evaluation 11/07/22    PT Start Time 1145    PT Stop Time 1223    PT Time Calculation (min) 38 min             Past Medical History:  Diagnosis Date   Abdominal pain 05/07/2017   Abdominal pain, epigastric 02/28/2007   Qualifier: Diagnosis of  By: Blossom Hoops MD, Luis     Acute left-sided low back pain with left-sided sciatica 01/14/2015   Acute left-sided low back pain without sciatica 05/09/2017   ALLERGIC RHINITIS 05/15/2006   Qualifier: Diagnosis of  By: Daine Gip     Allergy    Arm numbness 03/22/2020   BACK PAIN 10/31/2006   Qualifier: Diagnosis of  By: Yetta Barre CMA, Chemira     Bilateral lower extremity edema 10/19/2019   Bronchitis 09/14/2021   Chronic pain of right knee 11/11/2018   Chronic venous insufficiency 01/13/2019   Controlled type 2 diabetes mellitus with chronic kidney disease, without long-term current use of insulin (HCC) 09/20/2020   CTS (carpal tunnel syndrome) 03/17/2013   Diabetes mellitus without complication (HCC)    no meds, only check bs   Elevated CK 06/07/2021   Essential hypertension 11/05/2007   Qualifier: Diagnosis of  By: Janit Bern     Fatigue 09/20/2020   GAS/BLOATING 02/28/2007   Qualifier: Diagnosis of  By: Blossom Hoops MD, Luis     GASTROENTERITIS 02/28/2007   Qualifier: Diagnosis of  By: Blossom Hoops MD, Luis     Greater trochanteric bursitis of left hip 05/09/2017   Hyperglycemia 07/17/2012   Hyperlipidemia    Hyperlipidemia associated with type 2 diabetes mellitus (HCC) 10/19/2019   HYPERTENSION, BENIGN ESSENTIAL 10/31/2006   Qualifier: Diagnosis of  By: Yetta Barre CMA, Chemira     KNEE PAIN 01/30/2007   Qualifier: Diagnosis of  By: Blossom Hoops MD, Luis     Lymphocytosis 11/06/2011   MURMUR 11/20/2007   Qualifier:  Diagnosis of  By: Janit Bern     Neck pain 06/07/2021   Palpitations 09/20/2020   Primary hypertension 03/22/2020   SINUSITIS- ACUTE-NOS 04/02/2007   Qualifier: Diagnosis of  By: Blossom Hoops MD, Luis     Uncontrolled type 2 diabetes mellitus with hyperglycemia (HCC) 01/30/2019   Urinary frequency 07/17/2012   Urinary incontinence 11/11/2018   Varicose 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 11/11/2018   Vitamin D deficiency 03/22/2020   Past Surgical History:  Procedure Laterality Date   UTERINE FIBROID SURGERY     Patient Active Problem List   Diagnosis Date Noted   Bulging lumbar disc 08/10/2022   Arthritis of both knees 08/10/2022   Sore throat 02/27/2022   Travel advice encounter 01/18/2022   Allergy 11/29/2021   Diabetes mellitus without complication (HCC) 11/29/2021   Bronchitis 09/14/2021   Elevated CK 06/07/2021   Neck pain 06/07/2021   Controlled type 2 diabetes mellitus with chronic kidney disease, without long-term current use of insulin (HCC) 09/20/2020   Fatigue 09/20/2020   Palpitations 09/20/2020   Arm numbness 03/22/2020   Primary hypertension 03/22/2020   Vitamin D deficiency 03/22/2020   Hyperlipidemia associated with type 2 diabetes mellitus (HCC) 10/19/2019   Bilateral lower extremity edema 10/19/2019   Uncontrolled type 2 diabetes mellitus with hyperglycemia (HCC)  01/30/2019   Hyperlipidemia    Chronic venous insufficiency 01/13/2019   Varicose 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 11/11/2018   Chronic pain of right knee 11/11/2018   Urinary incontinence 11/11/2018   Greater trochanteric bursitis of left hip 05/09/2017   Acute left-sided low back pain without sciatica 05/09/2017   Abdominal pain 05/07/2017   Acute left-sided low back pain with left-sided sciatica 01/14/2015   CTS (carpal tunnel syndrome) 03/17/2013   Hyperglycemia 07/17/2012   Urinary frequency 07/17/2012   Lymphocytosis 11/06/2011   MURMUR 11/20/2007   Essential hypertension  11/05/2007   SINUSITIS- ACUTE-NOS 04/02/2007   GASTROENTERITIS 02/28/2007   GAS/BLOATING 02/28/2007   ABDOMINAL PAIN, EPIGASTRIC 02/28/2007   KNEE PAIN 01/30/2007   HYPERTENSION, BENIGN ESSENTIAL 10/31/2006   BACK PAIN 10/31/2006   ALLERGIC RHINITIS 05/15/2006    PCP: Donato Schultz, DO  REFERRING PROVIDER: Donato Schultz, DO  REFERRING DIAG:  M51.36 (ICD-10-CM) - Bulging lumbar disc  M17.0 (ICD-10-CM) - Arthritis of both knees    Rationale for Evaluation and Treatment: Rehabilitation  THERAPY DIAG:  Difficulty in walking, not elsewhere classified  Muscle weakness (generalized)  ONSET DATE: 08/10/22  SUBJECTIVE:                                                                                                                                                                                           SUBJECTIVE STATEMENT: "Got Covid after eval, since then back is better. Knee still issue but that is not why I am here"  PERTINENT HISTORY:  DM2, HTN,  PAIN:  Are you having pain? no  PRECAUTIONS: None  WEIGHT BEARING RESTRICTIONS: No  FALLS:  Has patient fallen in last 6 months? 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  PLOF: Independent  PATIENT GOALS: Pain relief, avoid surgery  NEXT MD VISIT: unknown  OBJECTIVE:   DIAGNOSTIC FINDINGS:  05/28/17 lumbar MRI IMPRESSION: Extruded disc fragment on the left L2-3 with impingement of the left L2 nerve root. Mild spinal stenosis   Mild spinal stenosis and mild foraminal stenosis bilaterally L4-5.   Mild subarticular stenosis bilaterally L5-S1.  SCREENING FOR RED FLAGS: Bowel or bladder incontinence: No Spinal tumors: No Cauda equina  syndrome: No Compression fracture: No Abdominal aneurysm: No  COGNITION: Overall cognitive status: Within functional limits for tasks assessed     SENSATION: [facility]  MUSCLE  LENGTH: Hamstrings: WNL, near 90 Thomas test: Mildly tight on L  POSTURE: decreased lumbar lordosis, decreased thoracic kyphosis, and right pelvic obliquity  PALPATION: TTP L QL.  LUMBAR ROM:   AROM eval  Flexion To toes  Extension WNL  Right lateral flexion 3" above knee  Left lateral flexion 3" above knee  Right rotation 80%  Left rotation 80%   (Blank rows = not tested)  LOWER EXTREMITY ROM:   B LE ROM WNL except L hip ER and IR slightly impeded.  LOWER EXTREMITY MMT:  BLE 5/5 except where noted.  MMT Right eval Left eval  Hip flexion  4  Hip extension 4- 4-  Hip abduction  4-  Hip adduction    Hip internal rotation    Hip external rotation    Knee flexion    Knee extension  4  Ankle dorsiflexion    Ankle plantarflexion    Ankle inversion    Ankle eversion     (Blank rows = not tested)  LUMBAR SPECIAL TESTS:  Straight leg raise test: Negative and Slump test: Negative  FUNCTIONAL TESTS:  Berg Balance Scale: TBD  GAIT: Distance walked: In clinic distances Assistive device utilized: None Level of assistance: Complete Independence Comments: Patient moves slowly, antalgic gait noted.  TODAY'S TREATMENT:                                                                                                                              DATE:   08/28/22 Nustep L 4 6 min Black tband trunk ext and flexion 2 set 10 Seated Row 20# 2 sets 10 Lat Pull 20# 2 sets 10 Feet on ball bridge, KTC and obl  Iso abdominals with ball 15 x, hold 3 sec Resisted clams and hip flex STS with wt ball 10 x chest press Cable pulley ext and row   08/15/22  Education- Has HEP, SKTC, figure 4, cobra, bridge, side lying hip abd   PATIENT EDUCATION:  Education details: POC, reviewed her current HEP Person  educated: Patient Education method: Medical illustrator Education comprehension: verbalized understanding and returned demonstration  HOME EXERCISE PROGRAM: Has HEP from previous therapy for herniated disc, SKTC, figure 4, cobra, bridge, side lying hip abd  ASSESSMENT:  CLINICAL IMPRESSION: After eval , pt got Covid and since back has been better , maybe decreased actvitiy? Pt realizes she needs strengthening to avoid flare up so we progressed initial strengthening with cuing for core activation and she tolerated well. STG met OBJECTIVE IMPAIRMENTS: Abnormal gait, decreased activity tolerance, decreased balance, decreased endurance, decreased mobility, difficulty walking, decreased ROM, decreased strength, impaired flexibility, improper body mechanics, postural dysfunction, and pain.   ACTIVITY LIMITATIONS: carrying, lifting, bending, sitting, stairs, and locomotion level  PARTICIPATION LIMITATIONS: meal prep, cleaning, laundry, shopping, community activity, and occupation  PERSONAL FACTORS: Age and Past/current experiences are also affecting patient's functional outcome.   REHAB POTENTIAL: Good  CLINICAL DECISION MAKING: Stable/uncomplicated  EVALUATION  COMPLEXITY: Moderate   GOALS: Goals reviewed with patient? Yes  SHORT TERM GOALS: Target date: 09/02/22  I with initial HEP Baseline: Goal status: 08/28/22/MET  LONG TERM GOALS: Target date: 11/07/22  I with final HEP Baseline:  Goal status: INITIAL  2.  Patient will score at least 44 on BERG Baseline:  Goal status: INITIAL  3.  Increase B hip strength to at least 4+/5 Baseline: 4- Goal status: INITIAL  4.  Patient will report 75% improvement in her back pain with a max <3/10. Baseline:  Goal status: INITIAL  5.  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  PLAN FOR NEXT SESSION:  assess response to todays session and update HEP with strengthening/postural stability exercises.   Marylene Land Aaidyn San PTA 08/28/2022, 12:08 PM [facility] at [facility] [address] [facility] [place], [place], [address] Phone: [phone]   Fax:  [phone]  Patient Details  Name: Angela Garner MRN: [id number] Date of Birth: Aug 14, 1960 Referring Provider:  Donato Schultz, *  Encounter Date: 08/28/2022   Suanne Marker, PTA 08/28/2022, 12:08 PM  [facility] at [facility] [address] [facility]. [place], [place], [address] Phone: [phone]   Fax:  [phone]

## 2022-08-28 NOTE — Assessment & Plan Note (Signed)
[place] abd  Check labs  F/u GI

## 2022-08-28 NOTE — Assessment & Plan Note (Signed)
Protonix daily  F/u GI D/w diet for gerd

## 2022-08-28 NOTE — Progress Notes (Addendum)
Subjective:   By signing my name below, I, [name], attest that this documentation has been prepared under the direction and in the presence of Donato Schultz, DO. 08/28/2022   Patient ID: Angela Garner, female    DOB: 1960/12/04, 62 y.o.   MRN: [id number]  Chief Complaint  Patient presents with   Bloated    Pt states having bloating and feeling dehydrated. Pt states right upper fullness. No nausea or vomiting.     HPI Patient is in today for a office visit.   She complains of bloating, dizziness and dehydrated. She denies nausea or vomiting. She had similar symptoms last year and found her symptoms resolved after taking famotidine. She has tried taking famotidine to manage her current symptoms and found no improvement. She is avoiding spicy foods. She is not belching or passing gas more frequently then normal. She currently has her gal bladder. She continues following up with her GYN specialist regularly.  She reports her blood sugar is measuring elevated at home. She continues taking Trulicity regularly.    Past Medical History:  Diagnosis Date   Abdominal pain 05/07/2017   Abdominal pain, epigastric 02/28/2007   Qualifier: Diagnosis of  By: Blossom Hoops MD, Luis     Acute left-sided low back pain with left-sided sciatica 01/14/2015   Acute left-sided low back pain without sciatica 05/09/2017   ALLERGIC RHINITIS 05/15/2006   Qualifier: Diagnosis of  By: Daine Gip     Allergy    Arm numbness 03/22/2020   BACK PAIN 10/31/2006   Qualifier: Diagnosis of  By: Yetta Barre CMA, Chemira     Bilateral lower extremity edema 10/19/2019   Bronchitis 09/14/2021   Chronic pain of right knee 11/11/2018   Chronic venous insufficiency 01/13/2019   Controlled type 2 diabetes mellitus with chronic kidney disease, without long-term current use of insulin (HCC) 09/20/2020   CTS (carpal tunnel syndrome) 03/17/2013   Diabetes mellitus without complication (HCC)    no meds, only check bs   Elevated CK  06/07/2021   Essential hypertension 11/05/2007   Qualifier: Diagnosis of  By: Janit Bern     Fatigue 09/20/2020   GAS/BLOATING 02/28/2007   Qualifier: Diagnosis of  By: Blossom Hoops MD, Luis     GASTROENTERITIS 02/28/2007   Qualifier: Diagnosis of  By: Blossom Hoops MD, Luis     Greater trochanteric bursitis of left hip 05/09/2017   Hyperglycemia 07/17/2012   Hyperlipidemia    Hyperlipidemia associated with type 2 diabetes mellitus (HCC) 10/19/2019   HYPERTENSION, BENIGN ESSENTIAL 10/31/2006   Qualifier: Diagnosis of  By: Yetta Barre CMA, Chemira     KNEE PAIN 01/30/2007   Qualifier: Diagnosis of  By: Blossom Hoops MD, Luis     Lymphocytosis 11/06/2011   MURMUR 11/20/2007   Qualifier: Diagnosis of  By: Janit Bern     Neck pain 06/07/2021   Palpitations 09/20/2020   Primary hypertension 03/22/2020   SINUSITIS- ACUTE-NOS 04/02/2007   Qualifier: Diagnosis of  By: Blossom Hoops MD, Luis     Uncontrolled type 2 diabetes mellitus with hyperglycemia (HCC) 01/30/2019   Urinary frequency 07/17/2012   Urinary incontinence 11/11/2018   Varicose 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 11/11/2018   Vitamin D deficiency 03/22/2020    Past Surgical History:  Procedure Laterality Date   UTERINE FIBROID SURGERY      Family History  Problem Relation Age of Onset   Dementia Mother    Hypertension Father    Diabetes Father  Heart attack Father    Diabetes Sister        she had stomach pain-- cause unknown   Clotting disorder Brother    Colon cancer Neg Hx    Esophageal cancer Neg Hx    Stomach cancer Neg Hx    Rectal cancer Neg Hx     Social History   Socioeconomic History   Marital status: Divorced    Spouse name: Not on file   Number of children: Not on file   Years of education: Not on file   Highest education level: Not on file  Occupational History   Occupation: [employer]     Comment: [employer]  Tobacco Use   Smoking status: Never   Smokeless tobacco: Never  Vaping Use    Vaping Use: Never used  Substance and Sexual Activity   Alcohol use: No   Drug use: No   Sexual activity: Yes  Other Topics Concern   Not on file  Social History Narrative   Not on file   Social Determinants of Health   Financial Resource Strain: Not on file  Food Insecurity: Not on file  Transportation Needs: Not on file  Physical Activity: Not on file  Stress: Not on file  Social Connections: Not on file  Intimate Partner Violence: Not on file    Outpatient Medications Prior to Visit  Medication Sig Dispense Refill   blood glucose meter kit and supplies KIT Check glucose daily 1 each 0   cyclobenzaprine (FLEXERIL) 5 MG tablet Take 1 tablet (5 mg total) by mouth 3 (three) times daily as needed for muscle spasms. 30 tablet 1   Dulaglutide (TRULICITY) 0.75 MG/0.5ML SOPN Inject 0.75 mg into the skin once a week. 2 mL 5   famotidine (PEPCID) 20 MG tablet Take 1 tablet (20 mg total) by mouth 2 (two) times daily. (Patient taking differently: Take 20 mg by mouth as needed for heartburn or indigestion.) 60 tablet 5   fluticasone (FLONASE) 50 MCG/ACT nasal spray Place 2 sprays into both nostrils daily. (Patient taking differently: Place 2 sprays into both nostrils as needed for allergies or rhinitis.) 16 g 6   levocetirizine (XYZAL) 5 MG tablet Take 1 tablet (5 mg total) by mouth every evening. 30 tablet 5   multivitamin-iron-minerals-folic acid (CENTRUM) chewable tablet Chew 1 tablet by mouth daily.     NONFORMULARY OR COMPOUNDED ITEM Compression stocking -- thigh high  20-30 mm/hg #1  Dx varicose veint 1 each 0   olmesartan (BENICAR) 40 MG tablet Take 1 tablet (40 mg total) by mouth daily. 90 tablet 1   promethazine-dextromethorphan (PROMETHAZINE-DM) 6.25-15 MG/5ML syrup Take 5 mLs by mouth 4 (four) times daily as needed. 118 mL 0   rosuvastatin (CRESTOR) 5 MG tablet Take 1 tablet by mouth daily 90 tablet 1   amLODipine (NORVASC) 5 MG tablet Take 1 tablet (5 mg total) by mouth daily. 90  tablet 1   No facility-administered medications prior to visit.    Allergies  Allergen Reactions   Metformin And Related    Pravachol [Pravastatin Sodium] Other (See Comments)    Muscle aches   Simvastatin     "leg pains"    Review of Systems  Constitutional:  Negative for fever and malaise/fatigue.       (+)dehydrated  HENT:  Negative for congestion.   Eyes:  Negative for blurred vision.  Respiratory:  Negative for shortness of breath.   Cardiovascular:  Negative for chest pain, palpitations and  leg swelling.  Gastrointestinal:  Negative for abdominal pain, blood in stool, nausea and vomiting.       (+)bloating   Genitourinary:  Negative for dysuria and frequency.  Musculoskeletal:  Negative for falls.  Skin:  Negative for rash.  Neurological:  Positive for dizziness. Negative for loss of consciousness and headaches.  Endo/Heme/Allergies:  Negative for environmental allergies.  Psychiatric/Behavioral:  Negative for depression. The patient is not nervous/anxious.        Objective:    Physical Exam Vitals and nursing note reviewed.  Constitutional:      General: She is not in acute distress.    Appearance: Normal appearance. She is not ill-appearing.  HENT:     Head: Normocephalic and atraumatic.     Right Ear: External ear normal.     Left Ear: External ear normal.  Eyes:     Extraocular Movements: Extraocular movements intact.     Pupils: Pupils are equal, round, and reactive to light.  Cardiovascular:     Rate and Rhythm: Normal rate and regular rhythm.     Heart sounds: Normal heart sounds. No murmur heard.    No gallop.  Pulmonary:     Effort: Pulmonary effort is normal. No respiratory distress.     Breath sounds: Normal breath sounds. No wheezing or rales.  Abdominal:     General: Bowel sounds are normal.     Palpations: Abdomen is soft.     Tenderness: There is no abdominal tenderness. There is no guarding or rebound.  Skin:    General: Skin is warm  and dry.  Neurological:     General: No focal deficit present.     Mental Status: She is alert and oriented to person, place, and time.  Psychiatric:        Mood and Affect: Mood normal.        Judgment: Judgment normal.     BP 118/70 (BP Location: Left Arm, Patient Position: Sitting, Cuff Size: Normal)   Pulse 80   Temp 98.5 F (36.9 C) (Oral)   Resp 18   Ht 5\' 8"  (1.727 m)   Wt 195 lb 12.8 oz (88.8 kg)   SpO2 97%   BMI 29.77 kg/m  Wt Readings from Last 3 Encounters:  08/28/22 195 lb 12.8 oz (88.8 kg)  08/17/22 195 lb 12.8 oz (88.8 kg)  08/10/22 191 lb 9.6 oz (86.9 kg)       Assessment & Plan:  Dyspepsia Assessment & Plan: Protonix daily  F/u GI D/w diet for gerd   Orders: -     Ambulatory referral to Gastroenterology -     Pantoprazole Sodium; Take 1 tablet (40 mg total) by mouth daily.  Dispense: 30 tablet; Refill: 3 -     H. pylori breath test  RUQ pain -     US ABDOMEN LIMITED RUQ (LIVER/GB); Future  Primary hypertension -     amLODIPine Besylate; Take 1 tablet (2.5 mg total) by mouth daily.  Dispense: 90 tablet; Refill: 0  Mixed stress and urge urinary incontinence -     Ambulatory referral to Urogynecology  GAS/BLOATING Assessment & Plan: [place] abd  Check labs  F/u GI     I, Donato Schultz, DO, personally preformed the services described in this documentation.  All medical record entries made by the scribe were at my direction and in my presence.  I have reviewed the chart and discharge instructions (if applicable) and agree that the record reflects my personal  performance and is accurate and complete. 08/28/2022   I,[name],acting as a scribe for Donato Schultz, DO.,have documented all relevant documentation on the behalf of Donato Schultz, DO,as directed by  Donato Schultz, DO while in the presence of Donato Schultz, DO.   Donato Schultz, DO

## 2022-08-30 LAB — H. PYLORI BREATH TEST: H. pylori Breath Test: NOT DETECTED

## 2022-08-31 ENCOUNTER — Ambulatory Visit: Payer: 59 | Admitting: Physical Therapy

## 2022-09-03 ENCOUNTER — Other Ambulatory Visit: Payer: Self-pay

## 2022-09-03 ENCOUNTER — Ambulatory Visit: Payer: 59

## 2022-09-03 DIAGNOSIS — M5442 Lumbago with sciatica, left side: Secondary | ICD-10-CM | POA: Diagnosis not present

## 2022-09-03 DIAGNOSIS — M25561 Pain in right knee: Secondary | ICD-10-CM | POA: Diagnosis not present

## 2022-09-03 DIAGNOSIS — G8929 Other chronic pain: Secondary | ICD-10-CM

## 2022-09-03 DIAGNOSIS — M25562 Pain in left knee: Secondary | ICD-10-CM | POA: Diagnosis not present

## 2022-09-03 DIAGNOSIS — M6281 Muscle weakness (generalized): Secondary | ICD-10-CM

## 2022-09-03 DIAGNOSIS — R262 Difficulty in walking, not elsewhere classified: Secondary | ICD-10-CM | POA: Diagnosis not present

## 2022-09-03 NOTE — Therapy (Signed)
OUTPATIENT PHYSICAL THERAPY THORACOLUMBAR    Patient Name: Angela Garner MRN: [id number] DOB:1960-07-13, 62 y.o., female Today's Date: 09/03/2022  END OF SESSION:  PT End of Session - 09/03/22 1601     Visit Number 3    Date for PT Re-Evaluation 11/07/22    PT Start Time 1602    PT Stop Time 1645    PT Time Calculation (min) 43 min    Activity Tolerance Patient tolerated treatment well    Behavior During Therapy [facility] for tasks assessed/performed              Past Medical History:  Diagnosis Date   Abdominal pain 05/07/2017   Abdominal pain, epigastric 02/28/2007   Qualifier: Diagnosis of  By: Blossom Hoops MD, Luis     Acute left-sided low back pain with left-sided sciatica 01/14/2015   Acute left-sided low back pain without sciatica 05/09/2017   ALLERGIC RHINITIS 05/15/2006   Qualifier: Diagnosis of  By: Daine Gip     Allergy    Arm numbness 03/22/2020   BACK PAIN 10/31/2006   Qualifier: Diagnosis of  By: Yetta Barre CMA, Chemira     Bilateral lower extremity edema 10/19/2019   Bronchitis 09/14/2021   Chronic pain of right knee 11/11/2018   Chronic venous insufficiency 01/13/2019   Controlled type 2 diabetes mellitus with chronic kidney disease, without long-term current use of insulin (HCC) 09/20/2020   CTS (carpal tunnel syndrome) 03/17/2013   Diabetes mellitus without complication (HCC)    no meds, only check bs   Elevated CK 06/07/2021   Essential hypertension 11/05/2007   Qualifier: Diagnosis of  By: Janit Bern     Fatigue 09/20/2020   GAS/BLOATING 02/28/2007   Qualifier: Diagnosis of  By: Blossom Hoops MD, Luis     GASTROENTERITIS 02/28/2007   Qualifier: Diagnosis of  By: Blossom Hoops MD, Luis     Greater trochanteric bursitis of left hip 05/09/2017   Hyperglycemia 07/17/2012   Hyperlipidemia    Hyperlipidemia associated with type 2 diabetes mellitus (HCC) 10/19/2019   HYPERTENSION, BENIGN ESSENTIAL 10/31/2006   Qualifier: Diagnosis of  By: Yetta Barre CMA, Chemira     KNEE PAIN  01/30/2007   Qualifier: Diagnosis of  By: Blossom Hoops MD, Luis     Lymphocytosis 11/06/2011   MURMUR 11/20/2007   Qualifier: Diagnosis of  By: Janit Bern     Neck pain 06/07/2021   Palpitations 09/20/2020   Primary hypertension 03/22/2020   SINUSITIS- ACUTE-NOS 04/02/2007   Qualifier: Diagnosis of  By: Blossom Hoops MD, Luis     Uncontrolled type 2 diabetes mellitus with hyperglycemia (HCC) 01/30/2019   Urinary frequency 07/17/2012   Urinary incontinence 11/11/2018   Varicose 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 11/11/2018   Vitamin D deficiency 03/22/2020   Past Surgical History:  Procedure Laterality Date   UTERINE FIBROID SURGERY     Patient Active Problem List   Diagnosis Date Noted   Dyspepsia 08/28/2022   RUQ pain 08/28/2022   Bulging lumbar disc 08/10/2022   Arthritis of both knees 08/10/2022   Sore throat 02/27/2022   Travel advice encounter 01/18/2022   Allergy 11/29/2021   Diabetes mellitus without complication (HCC) 11/29/2021   Bronchitis 09/14/2021   Elevated CK 06/07/2021   Neck pain 06/07/2021   Controlled type 2 diabetes mellitus with chronic kidney disease, without long-term current use of insulin (HCC) 09/20/2020   Fatigue 09/20/2020   Palpitations 09/20/2020   Arm numbness 03/22/2020   Primary hypertension 03/22/2020   Vitamin D deficiency  03/22/2020   Hyperlipidemia associated with type 2 diabetes mellitus (HCC) 10/19/2019   Bilateral lower extremity edema 10/19/2019   Uncontrolled type 2 diabetes mellitus with hyperglycemia (HCC) 01/30/2019   Hyperlipidemia    Chronic venous insufficiency 01/13/2019   Varicose 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 11/11/2018   Chronic pain of right knee 11/11/2018   Urinary incontinence 11/11/2018   Greater trochanteric bursitis of left hip 05/09/2017   Acute left-sided low back pain without sciatica 05/09/2017   Abdominal pain 05/07/2017   Acute left-sided low back pain with left-sided sciatica 01/14/2015   CTS  (carpal tunnel syndrome) 03/17/2013   Hyperglycemia 07/17/2012   Urinary frequency 07/17/2012   Lymphocytosis 11/06/2011   MURMUR 11/20/2007   Essential hypertension 11/05/2007   SINUSITIS- ACUTE-NOS 04/02/2007   GASTROENTERITIS 02/28/2007   GAS/BLOATING 02/28/2007   ABDOMINAL PAIN, EPIGASTRIC 02/28/2007   KNEE PAIN 01/30/2007   HYPERTENSION, BENIGN ESSENTIAL 10/31/2006   BACK PAIN 10/31/2006   ALLERGIC RHINITIS 05/15/2006    PCP: Donato Schultz, DO  REFERRING PROVIDER: Donato Schultz, DO  REFERRING DIAG:  M51.36 (ICD-10-CM) - Bulging lumbar disc  M17.0 (ICD-10-CM) - Arthritis of both knees    Rationale for Evaluation and Treatment: Rehabilitation  THERAPY DIAG:  Difficulty in walking, not elsewhere classified  Muscle weakness (generalized)  Acute left-sided low back pain with left-sided sciatica  Bilateral chronic knee pain  ONSET DATE: 08/10/22  SUBJECTIVE:                                                                                                                                                                                           SUBJECTIVE STATEMENT:  Not that painful today, overall getting better.   PERTINENT HISTORY:  DM2, HTN,  PAIN:  Are you having pain? no  PRECAUTIONS: None  WEIGHT BEARING RESTRICTIONS: No  FALLS:  Has patient fallen in last 6 months? 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  PLOF: Independent  PATIENT GOALS: Pain relief, avoid surgery  NEXT MD VISIT: unknown  OBJECTIVE:   DIAGNOSTIC FINDINGS:  05/28/17 lumbar MRI IMPRESSION: Extruded disc fragment on the left L2-3 with impingement of the left L2 nerve root. Mild spinal stenosis   Mild  spinal stenosis and mild foraminal stenosis bilaterally L4-5.   Mild subarticular stenosis bilaterally L5-S1.  SCREENING FOR RED FLAGS: Bowel or bladder incontinence: No  Spinal tumors: No Cauda equina syndrome: No Compression fracture: No Abdominal aneurysm: No  COGNITION: Overall cognitive status: Within functional limits for tasks assessed     SENSATION: WFL  MUSCLE LENGTH: Hamstrings: WNL, near 90 Thomas test: Mildly tight on L  POSTURE: decreased lumbar lordosis, decreased thoracic kyphosis, and right pelvic obliquity  PALPATION: TTP L QL.  LUMBAR ROM:   AROM eval  Flexion To toes  Extension WNL  Right lateral flexion 3" above knee  Left lateral flexion 3" above knee  Right rotation 80%  Left rotation 80%   (Blank rows = not tested)  LOWER EXTREMITY ROM:   B LE ROM WNL except L hip ER and IR slightly impeded.  LOWER EXTREMITY MMT:  BLE 5/5 except where noted.  MMT Right eval Left eval  Hip flexion  4  Hip extension 4- 4-  Hip abduction  4-  Hip adduction    Hip internal rotation    Hip external rotation    Knee flexion    Knee extension  4  Ankle dorsiflexion    Ankle plantarflexion    Ankle inversion    Ankle eversion     (Blank rows = not tested)  LUMBAR SPECIAL TESTS:  Straight leg raise test: Negative and Slump test: Negative  FUNCTIONAL TESTS:  Berg Balance Scale: TBD  GAIT: Distance walked: In clinic distances Assistive device utilized: None Level of assistance: Complete Independence Comments: Patient moves slowly, antalgic gait noted.  TODAY'S TREATMENT:                                                                                                                              DATE:  09/03/22: Seated rows 20#, 15 x 2 Lat rows 20#, 2 sets 15  Nustep L 5, 6 min for endurance, cardiovascular strengthening Supine bridging over 65cm ball Supine tailor stretch with each leg on 65 cm ball  B knee to chest, heels on 65 cm ball , with  abdominal tightening Single leg stance for hinge / posterior chain strength 3# dumb bell, 10 x each  Side lying for manually resisted clams, hip abd 10 reps each Seated for B shoulder flexion, lumbar flexion stretch, side to side with large therapeutic ball Prone for therapist  to roll foam roller B gluts, mid thoracic spine, lumbar region with 6" roller      08/28/22 Nustep L 4 6 min Black tband trunk ext and flexion 2 set 10 Seated Row 20# 2 sets 10 Lat Pull 20# 2 sets 10 Feet on ball bridge, KTC and obl  Iso abdominals with ball 15 x, hold 3 sec Resisted clams and hip flex STS with wt ball 10 x chest press Cable pulley ext and row   08/15/22  Education- Has HEP, SKTC, figure 4, cobra, bridge, side lying hip abd   PATIENT EDUCATION:  Education details: POC, reviewed her current HEP Person educated: Patient Education method: Medical illustrator Education comprehension: verbalized  understanding and returned demonstration  HOME EXERCISE PROGRAM: Has HEP from previous therapy for herniated disc, SKTC, figure 4, cobra, bridge, side lying hip abd  ASSESSMENT:  CLINICAL IMPRESSION: Returned today to address her lower back pain.  Reports responding well so far to the therx, strengthening. Continued with ex, emphasis on periscapular strengthening and hip stretching/ strengthening. Low pain rating today, did fatigue quickly with the lateral hip strengthening and had difficulty with the unilateral hinging forward.   OBJECTIVE IMPAIRMENTS: Abnormal gait, decreased activity tolerance, decreased balance, decreased endurance, decreased mobility, difficulty walking, decreased ROM, decreased strength, impaired flexibility, improper body mechanics, postural dysfunction, and pain.   ACTIVITY LIMITATIONS: carrying, lifting, bending, sitting, stairs, and locomotion level  PARTICIPATION LIMITATIONS: meal prep, cleaning, laundry, shopping, community activity, and occupation  PERSONAL  FACTORS: Age and Past/current experiences are also affecting patient's functional outcome.   REHAB POTENTIAL: Good  CLINICAL DECISION MAKING: Stable/uncomplicated  EVALUATION COMPLEXITY: Moderate   GOALS: Goals reviewed with patient? Yes  SHORT TERM GOALS: Target date: 09/02/22  I with initial HEP Baseline: Goal status: 08/28/22/MET  LONG TERM GOALS: Target date: 11/07/22  I with final HEP Baseline:  Goal status: IN PROGRESS  2.  Patient will score at least 44 on BERG Baseline:  Goal status: INITIAL  3.  Increase B hip strength to at least 4+/5 Baseline: 4- Goal status: INITIAL  4.  Patient will report 75% improvement in her back pain with a max <3/10. Baseline:  Goal status: INITIAL  5.  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  PLAN FOR NEXT SESSION:  continue to assess response to todays session and update HEP with strengthening/postural stability exercises.   Marylene Land Payseur PTA 09/03/2022, 4:55 PM [place] [facility] [address] [facility] [place], [place], [address] Phone: [phone]   Fax:  [phone]  Patient Details  Name: Angela Garner MRN: [id number] Date of Birth: [birth year] Referring Provider:  Donato Schultz, *  Encounter Date: 09/03/2022   Early Chars, PT 09/03/2022, 4:55 PM  [place] [facility] [address] [facility]. [place], [place], [address] Phone: [phone]    Fax:  [phone]

## 2022-09-07 ENCOUNTER — Ambulatory Visit: Payer: 59

## 2022-09-10 ENCOUNTER — Other Ambulatory Visit: Payer: Self-pay

## 2022-09-10 ENCOUNTER — Ambulatory Visit: Payer: 59

## 2022-09-10 ENCOUNTER — Ambulatory Visit (HOSPITAL_BASED_OUTPATIENT_CLINIC_OR_DEPARTMENT_OTHER)
Admission: RE | Admit: 2022-09-10 | Discharge: 2022-09-10 | Disposition: A | Discharge status: Home / Self Care | Payer: 59 | Source: Ambulatory Visit | Attending: Family Medicine | Admitting: Family Medicine

## 2022-09-10 DIAGNOSIS — G8929 Other chronic pain: Secondary | ICD-10-CM | POA: Diagnosis not present

## 2022-09-10 DIAGNOSIS — M6281 Muscle weakness (generalized): Secondary | ICD-10-CM | POA: Diagnosis not present

## 2022-09-10 DIAGNOSIS — R262 Difficulty in walking, not elsewhere classified: Secondary | ICD-10-CM | POA: Diagnosis not present

## 2022-09-10 DIAGNOSIS — M25562 Pain in left knee: Secondary | ICD-10-CM | POA: Diagnosis not present

## 2022-09-10 DIAGNOSIS — R1011 Right upper quadrant pain: Secondary | ICD-10-CM

## 2022-09-10 DIAGNOSIS — M5442 Lumbago with sciatica, left side: Secondary | ICD-10-CM | POA: Diagnosis not present

## 2022-09-10 DIAGNOSIS — M25561 Pain in right knee: Secondary | ICD-10-CM | POA: Diagnosis not present

## 2022-09-10 NOTE — Therapy (Signed)
OUTPATIENT PHYSICAL THERAPY THORACOLUMBAR    Patient Name: Angela Garner MRN: [id number] DOB:November 25, 1960, 62 y.o., female Today's Date: 09/10/2022  END OF SESSION:  PT End of Session - 09/10/22 1613     Visit Number 4    Date for PT Re-Evaluation 11/07/22    PT Start Time 1604    PT Stop Time 1645    PT Time Calculation (min) 41 min    Activity Tolerance Patient tolerated treatment well    Behavior During Therapy [facility] for tasks assessed/performed              Past Medical History:  Diagnosis Date   Abdominal pain 05/07/2017   Abdominal pain, epigastric 02/28/2007   Qualifier: Diagnosis of  By: Blossom Hoops MD, Luis     Acute left-sided low back pain with left-sided sciatica 01/14/2015   Acute left-sided low back pain without sciatica 05/09/2017   ALLERGIC RHINITIS 05/15/2006   Qualifier: Diagnosis of  By: Daine Gip     Allergy    Arm numbness 03/22/2020   BACK PAIN 10/31/2006   Qualifier: Diagnosis of  By: Yetta Barre CMA, Chemira     Bilateral lower extremity edema 10/19/2019   Bronchitis 09/14/2021   Chronic pain of right knee 11/11/2018   Chronic venous insufficiency 01/13/2019   Controlled type 2 diabetes mellitus with chronic kidney disease, without long-term current use of insulin (HCC) 09/20/2020   CTS (carpal tunnel syndrome) 03/17/2013   Diabetes mellitus without complication (HCC)    no meds, only check bs   Elevated CK 06/07/2021   Essential hypertension 11/05/2007   Qualifier: Diagnosis of  By: Janit Bern     Fatigue 09/20/2020   GAS/BLOATING 02/28/2007   Qualifier: Diagnosis of  By: Blossom Hoops MD, Luis     GASTROENTERITIS 02/28/2007   Qualifier: Diagnosis of  By: Blossom Hoops MD, Luis     Greater trochanteric bursitis of left hip 05/09/2017   Hyperglycemia 07/17/2012   Hyperlipidemia    Hyperlipidemia associated with type 2 diabetes mellitus (HCC) 10/19/2019   HYPERTENSION, BENIGN ESSENTIAL 10/31/2006   Qualifier: Diagnosis of  By: Yetta Barre CMA, Chemira     KNEE PAIN  01/30/2007   Qualifier: Diagnosis of  By: Blossom Hoops MD, Luis     Lymphocytosis 11/06/2011   MURMUR 11/20/2007   Qualifier: Diagnosis of  By: Janit Bern     Neck pain 06/07/2021   Palpitations 09/20/2020   Primary hypertension 03/22/2020   SINUSITIS- ACUTE-NOS 04/02/2007   Qualifier: Diagnosis of  By: Blossom Hoops MD, Luis     Uncontrolled type 2 diabetes mellitus with hyperglycemia (HCC) 01/30/2019   Urinary frequency 07/17/2012   Urinary incontinence 11/11/2018   Varicose 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 11/11/2018   Vitamin D deficiency 03/22/2020   Past Surgical History:  Procedure Laterality Date   UTERINE FIBROID SURGERY     Patient Active Problem List   Diagnosis Date Noted   Dyspepsia 08/28/2022   RUQ pain 08/28/2022   Bulging lumbar disc 08/10/2022   Arthritis of both knees 08/10/2022   Sore throat 02/27/2022   Travel advice encounter 01/18/2022   Allergy 11/29/2021   Diabetes mellitus without complication (HCC) 11/29/2021   Bronchitis 09/14/2021   Elevated CK 06/07/2021   Neck pain 06/07/2021   Controlled type 2 diabetes mellitus with chronic kidney disease, without long-term current use of insulin (HCC) 09/20/2020   Fatigue 09/20/2020   Palpitations 09/20/2020   Arm numbness 03/22/2020   Primary hypertension 03/22/2020   Vitamin D deficiency  03/22/2020   Hyperlipidemia associated with type 2 diabetes mellitus (HCC) 10/19/2019   Bilateral lower extremity edema 10/19/2019   Uncontrolled type 2 diabetes mellitus with hyperglycemia (HCC) 01/30/2019   Hyperlipidemia    Chronic venous insufficiency 01/13/2019   Varicose 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 11/11/2018   Chronic pain of right knee 11/11/2018   Urinary incontinence 11/11/2018   Greater trochanteric bursitis of left hip 05/09/2017   Acute left-sided low back pain without sciatica 05/09/2017   Abdominal pain 05/07/2017   Acute left-sided low back pain with left-sided sciatica 01/14/2015   CTS  (carpal tunnel syndrome) 03/17/2013   Hyperglycemia 07/17/2012   Urinary frequency 07/17/2012   Lymphocytosis 11/06/2011   MURMUR 11/20/2007   Essential hypertension 11/05/2007   SINUSITIS- ACUTE-NOS 04/02/2007   GASTROENTERITIS 02/28/2007   GAS/BLOATING 02/28/2007   ABDOMINAL PAIN, EPIGASTRIC 02/28/2007   KNEE PAIN 01/30/2007   HYPERTENSION, BENIGN ESSENTIAL 10/31/2006   BACK PAIN 10/31/2006   ALLERGIC RHINITIS 05/15/2006    PCP: Donato Schultz, DO  REFERRING PROVIDER: Donato Schultz, DO  REFERRING DIAG:  M51.36 (ICD-10-CM) - Bulging lumbar disc  M17.0 (ICD-10-CM) - Arthritis of both knees    Rationale for Evaluation and Treatment: Rehabilitation  THERAPY DIAG:  Difficulty in walking, not elsewhere classified  Muscle weakness (generalized)  Acute left-sided low back pain with left-sided sciatica  Bilateral chronic knee pain  ONSET DATE: 08/10/22  SUBJECTIVE:                                                                                                                                                                                           SUBJECTIVE STATEMENT:  Not that painful today, overall getting better.   PERTINENT HISTORY:  DM2, HTN,  PAIN:  Are you having pain? no  PRECAUTIONS: None  WEIGHT BEARING RESTRICTIONS: No  FALLS:  Has patient fallen in last 6 months? 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  PLOF: Independent  PATIENT GOALS: Pain relief, avoid surgery  NEXT MD VISIT: unknown  OBJECTIVE:   DIAGNOSTIC FINDINGS:  05/28/17 lumbar MRI IMPRESSION: Extruded disc fragment on the left L2-3 with impingement of the left L2 nerve root. Mild spinal stenosis   Mild  spinal stenosis and mild foraminal stenosis bilaterally L4-5.   Mild subarticular stenosis bilaterally L5-S1.  SCREENING FOR RED FLAGS: Bowel or bladder incontinence: No  Spinal tumors: No Cauda equina syndrome: No Compression fracture: No Abdominal aneurysm: No  COGNITION: Overall cognitive status: Within functional limits for tasks assessed     SENSATION: WFL  MUSCLE LENGTH: Hamstrings: WNL, near 90 Thomas test: Mildly tight on L  POSTURE: decreased lumbar lordosis, decreased thoracic kyphosis, and right pelvic obliquity  PALPATION: TTP L QL.  LUMBAR ROM:   AROM eval  Flexion To toes  Extension WNL  Right lateral flexion 3" above knee  Left lateral flexion 3" above knee  Right rotation 80%  Left rotation 80%   (Blank rows = not tested)  LOWER EXTREMITY ROM:   B LE ROM WNL except L hip ER and IR slightly impeded.  LOWER EXTREMITY MMT:  BLE 5/5 except where noted.  MMT Right eval Left eval  Hip flexion  4  Hip extension 4- 4-  Hip abduction  4-  Hip adduction    Hip internal rotation    Hip external rotation    Knee flexion    Knee extension  4  Ankle dorsiflexion    Ankle plantarflexion    Ankle inversion    Ankle eversion     (Blank rows = not tested)  LUMBAR SPECIAL TESTS:  Straight leg raise test: Negative and Slump test: Negative  FUNCTIONAL TESTS:  Berg Balance Scale: TBD  GAIT: Distance walked: In clinic distances Assistive device utilized: None Level of assistance: Complete Independence Comments: Patient moves slowly, antalgic gait noted.  TODAY'S TREATMENT:                                                                                                                              DATE: 09/10/22:  Yellow t band B shoulder ER Seated rows feet on green t band Seated hip abd/ Er with t band  Bridging LE's over 65 cm ball 15 x 2 Supine B knee to chest, feet on 65 cm ball, cues to engage lower abdominals during movement Supine for moist  heat lumbar spine(not included in Tx time)8 min Leg press, 30 reps, B LE's 20#  Leg press for ankle pumps 15 x 20#    09/03/22: Seated rows 20#, 15 x 2 Lat rows 20#, 2 sets 15  Nustep L 5, 6 min for endurance, cardiovascular strengthening Supine bridging over 65cm ball Supine tailor stretch with each leg on 65 cm ball  B knee to chest, heels on 65 cm ball , with abdominal tightening Single leg stance for hinge / posterior chain strength 3# dumb bell, 10 x each  Side lying for manually resisted clams, hip abd 10 reps each Seated for B shoulder flexion, lumbar flexion stretch, side to side with large therapeutic ball Prone for therapist  to roll foam roller B gluts, mid thoracic spine, lumbar region with 6" roller    08/28/22 Nustep L 4 6 min Black tband trunk ext and flexion 2 set 10 Seated Row 20# 2 sets 10 Lat Pull 20# 2  sets 10 Feet on ball bridge, KTC and obl  Iso abdominals with ball 15 x, hold 3 sec Resisted clams and hip flex STS with wt ball 10 x chest press Cable pulley ext and row   08/15/22  Education- Has HEP, SKTC, figure 4, cobra, bridge, side lying hip abd   PATIENT EDUCATION:  Education details: POC, reviewed her current HEP Person educated: Patient Education method: Medical illustrator Education comprehension: verbalized understanding and returned demonstration  HOME EXERCISE PROGRAM: Has HEP from previous therapy for herniated disc, SKTC, figure 4, cobra, bridge, side lying hip abd Access Code: [email or web address] URL: [email or web address] Date: 09/10/2022 Prepared by: Katielynn Horan  Exercises - Shoulder External Rotation and Scapular Retraction with Resistance  - 1 x daily - 7 x weekly - 3 sets - 10 reps - Seated Shoulder Row with Resistance Anchored at Feet  - 1 x daily - 7 x weekly - 3 sets - 10 reps - Seated Hip Abduction with Resistance  - 1 x daily - 7 x weekly - 3 sets - 10 reps - Supine Bridge with Pelvic Floor Contraction on Swiss  Ball  - 1 x daily - 7 x weekly - 3 sets - 10 reps - Supine Knees to Chest with Swiss Ball  - 1 x daily - 7 x weekly - 3 sets - 10 reps ASSESSMENT:  CLINICAL IMPRESSION: Returned today to address her lower back pain.  Reports responding well so far to the therx, strengthening. States she has equipment at home, TM, bike, and therapeutic ball, so addressed strengthening with bands and ball fo her to utilize at home.  We discussed reassessment at her next visit in around 2 weeks, then most likely DC as she is responding well to therex. C  OBJECTIVE IMPAIRMENTS: Abnormal gait, decreased activity tolerance, decreased balance, decreased endurance, decreased mobility, difficulty walking, decreased ROM, decreased strength, impaired flexibility, improper body mechanics, postural dysfunction, and pain.   ACTIVITY LIMITATIONS: carrying, lifting, bending, sitting, stairs, and locomotion level  PARTICIPATION LIMITATIONS: meal prep, cleaning, laundry, shopping, community activity, and occupation  PERSONAL FACTORS: Age and Past/current experiences are also affecting patient's functional outcome.   REHAB POTENTIAL: Good  CLINICAL DECISION MAKING: Stable/uncomplicated  EVALUATION COMPLEXITY: Moderate   GOALS: Goals reviewed with patient? Yes  SHORT TERM GOALS: Target date: 09/02/22  I with initial HEP Baseline: Goal status: 08/28/22/MET  LONG TERM GOALS: Target date: 11/07/22  I with final HEP Baseline:  Goal status: IN PROGRESS  2.  Patient will score at least 44 on BERG Baseline:  Goal status: INITIAL  3.  Increase B hip strength to at least 4+/5 Baseline: 4- Goal status: INITIAL  4.  Patient will report 75% improvement in her back pain with a max <3/10. Baseline:  Goal status: INITIAL  5.  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  PLAN FOR NEXT SESSION:  continue to assess response to todays session and update HEP with strengthening/postural stability exercises.   Arnold Depinto. PT, DPT 09/10/2022, 5:05 PM [facility] [address] [facility]. [place], [place], [address]  Phone: [phone]   Fax:  [phone]  Patient Details  Name: Angela Garner MRN: [id number] Date of Birth: 08/17/1960 Referring Provider:  Donato Schultz, *  Encounter Date: 09/10/2022   Early Chars, PT 09/10/2022, 5:05 PM  [facility] [address] [facility]. [place], [place], [address] Phone: [phone]   Fax:  [phone]

## 2022-09-14 ENCOUNTER — Ambulatory Visit: Payer: 59 | Admitting: Physical Therapy

## 2022-09-26 ENCOUNTER — Ambulatory Visit: Payer: 59

## 2022-11-13 ENCOUNTER — Other Ambulatory Visit: Payer: Self-pay | Admitting: Family Medicine

## 2022-11-13 DIAGNOSIS — I10 Essential (primary) hypertension: Secondary | ICD-10-CM

## 2022-11-16 ENCOUNTER — Other Ambulatory Visit: Payer: Self-pay | Admitting: Family Medicine

## 2022-11-16 DIAGNOSIS — I10 Essential (primary) hypertension: Secondary | ICD-10-CM

## 2022-11-19 ENCOUNTER — Ambulatory Visit (INDEPENDENT_AMBULATORY_CARE_PROVIDER_SITE_OTHER): Payer: 59 | Admitting: Family Medicine

## 2022-11-19 ENCOUNTER — Encounter: Payer: Self-pay | Admitting: Family Medicine

## 2022-11-19 VITALS — BP 110/80 | HR 80 | Temp 98.9°F | Resp 18 | Ht 68.0 in | Wt 192.2 lb

## 2022-11-19 DIAGNOSIS — R748 Abnormal levels of other serum enzymes: Secondary | ICD-10-CM

## 2022-11-19 DIAGNOSIS — Z0000 Encounter for general adult medical examination without abnormal findings: Secondary | ICD-10-CM

## 2022-11-19 DIAGNOSIS — I10 Essential (primary) hypertension: Secondary | ICD-10-CM | POA: Diagnosis not present

## 2022-11-19 DIAGNOSIS — E1165 Type 2 diabetes mellitus with hyperglycemia: Secondary | ICD-10-CM | POA: Diagnosis not present

## 2022-11-19 DIAGNOSIS — R32 Unspecified urinary incontinence: Secondary | ICD-10-CM

## 2022-11-19 DIAGNOSIS — E1169 Type 2 diabetes mellitus with other specified complication: Secondary | ICD-10-CM

## 2022-11-19 DIAGNOSIS — E785 Hyperlipidemia, unspecified: Secondary | ICD-10-CM | POA: Diagnosis not present

## 2022-11-19 LAB — LIPID PANEL
Cholesterol: 188 mg/dL (ref 0–200)
HDL: 80.3 mg/dL (ref >39.00)
LDL Cholesterol: 95 mg/dL (ref 0–99)
NonHDL: 107.59
Total CHOL/HDL Ratio: 2
Triglycerides: 63 mg/dL (ref 0.0–149.0)
VLDL: 12.6 mg/dL (ref 0.0–40.0)

## 2022-11-19 LAB — CBC WITH DIFFERENTIAL/PLATELET
Basophils Absolute: 0 10*3/uL (ref 0.0–0.1)
Basophils Relative: 0.5 % (ref 0.0–3.0)
Eosinophils Absolute: 0 10*3/uL (ref 0.0–0.7)
Eosinophils Relative: 0.9 % (ref 0.0–5.0)
HCT: 45 % (ref 36.0–46.0)
Hemoglobin: 14.8 g/dL (ref 12.0–15.0)
Lymphocytes Relative: 27.2 % (ref 12.0–46.0)
Lymphs Abs: 1.1 10*3/uL (ref 0.7–4.0)
MCHC: 33 g/dL (ref 30.0–36.0)
MCV: 93.9 fl (ref 78.0–100.0)
Monocytes Absolute: 0.4 10*3/uL (ref 0.1–1.0)
Monocytes Relative: 9.4 % (ref 3.0–12.0)
Neutro Abs: 2.5 10*3/uL (ref 1.4–7.7)
Neutrophils Relative %: 62 % (ref 43.0–77.0)
Platelets: 182 10*3/uL (ref 150.0–400.0)
RBC: 4.79 Mil/uL (ref 3.87–5.11)
RDW: 13.9 % (ref 11.5–15.5)
WBC: 4 10*3/uL (ref 4.0–10.5)

## 2022-11-19 LAB — COMPREHENSIVE METABOLIC PANEL
ALT: 19 U/L (ref 0–35)
AST: 24 U/L (ref 0–37)
Albumin: 4.3 g/dL (ref 3.5–5.2)
Alkaline Phosphatase: 74 U/L (ref 39–117)
BUN: 14 mg/dL (ref 6–23)
CO2: 30 mEq/L (ref 19–32)
Calcium: 9.3 mg/dL (ref 8.4–10.5)
Chloride: 102 mEq/L (ref 96–112)
Creatinine, Ser: 0.96 mg/dL (ref 0.40–1.20)
GFR: 63.13 mL/min (ref >60.00)
Glucose, Bld: 109 mg/dL — ABNORMAL HIGH (ref 70–99)
Potassium: 3.6 mEq/L (ref 3.5–5.1)
Sodium: 139 mEq/L (ref 135–145)
Total Bilirubin: 0.7 mg/dL (ref 0.2–1.2)
Total Protein: 6.9 g/dL (ref 6.0–8.3)

## 2022-11-19 LAB — HEMOGLOBIN A1C: Hgb A1c MFr Bld: 6 % (ref 4.6–6.5)

## 2022-11-19 LAB — MICROALBUMIN / CREATININE URINE RATIO
Creatinine,U: 243.4 mg/dL
Microalb Creat Ratio: 4.2 mg/g (ref 0.0–30.0)
Microalb, Ur: 10.2 mg/dL — ABNORMAL HIGH (ref 0.0–1.9)

## 2022-11-19 LAB — CK: Total CK: 344 U/L — ABNORMAL HIGH (ref 7–177)

## 2022-11-19 NOTE — Assessment & Plan Note (Signed)
Well controlled, no changes to meds. Encouraged heart healthy diet such as the DASH diet and exercise as tolerated.  °

## 2022-11-19 NOTE — Progress Notes (Addendum)
Established Patient Office Visit  Subjective   Patient ID: Angela Garner, female    DOB: 1960-05-30  Age: 62 y.o. MRN: [id number]  Chief Complaint  Patient presents with   Annual Exam    Pt states fasting     HPI Pt with hx dm, chol and htn is here today for cpe.  No new complaints.  She has a urogyn app coming up.   The patient presents for a physical and reports a persistently dry mouth. They are unsure if this is a side effect of their current medication. They also report that certain brands of bottled water have been causing stomach discomfort. They have tried different brands and found that spring water was initially tolerable, but recently it has also been causing discomfort. They are considering switching to filtered tap water.  The patient's blood pressure is well-controlled on their current medication, which they have not taken today. They report occasional lightheadedness and headaches, at which times they check their blood pressure at home.  The patient has been experiencing difficulty tolerating spicy foods, which they have enjoyed all their life. They are taking stomach medication as needed for this issue.  The patient has seen an eye doctor since their last visit here, but they were not satisfied with the care they received and are considering finding a new provider. They have broken their eyeglasses and are considering getting vision insurance to help cover the cost of a new pair.  The patient also reports a busy and stressful weekend, including car trouble and a broken pair of eyeglasses. Patient Active Problem List   Diagnosis Date Noted   Dyspepsia 08/28/2022   RUQ pain 08/28/2022   Bulging lumbar disc 08/10/2022   Arthritis of both knees 08/10/2022   Sore throat 02/27/2022   Preventative health care 01/18/2022   Allergy 11/29/2021   Diabetes mellitus without complication (HCC) 11/29/2021   Bronchitis 09/14/2021   Elevated CK 06/07/2021   Neck pain 06/07/2021    Controlled type 2 diabetes mellitus with chronic kidney disease, without long-term current use of insulin (HCC) 09/20/2020   Fatigue 09/20/2020   Palpitations 09/20/2020   Arm numbness 03/22/2020   Primary hypertension 03/22/2020   Vitamin D deficiency 03/22/2020   Hyperlipidemia associated with type 2 diabetes mellitus (HCC) 10/19/2019   Bilateral lower extremity edema 10/19/2019   Uncontrolled type 2 diabetes mellitus with hyperglycemia (HCC) 01/30/2019   Hyperlipidemia    Chronic venous insufficiency 01/13/2019   Varicose 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 11/11/2018   Chronic pain of right knee 11/11/2018   Urinary incontinence 11/11/2018   Greater trochanteric bursitis of left hip 05/09/2017   Acute left-sided low back pain without sciatica 05/09/2017   Abdominal pain 05/07/2017   Acute left-sided low back pain with left-sided sciatica 01/14/2015   CTS (carpal tunnel syndrome) 03/17/2013   Hyperglycemia 07/17/2012   Urinary frequency 07/17/2012   Lymphocytosis 11/06/2011   MURMUR 11/20/2007   Essential hypertension 11/05/2007   SINUSITIS- ACUTE-NOS 04/02/2007   GASTROENTERITIS 02/28/2007   GAS/BLOATING 02/28/2007   ABDOMINAL PAIN, EPIGASTRIC 02/28/2007   KNEE PAIN 01/30/2007   HYPERTENSION, BENIGN ESSENTIAL 10/31/2006   BACK PAIN 10/31/2006   ALLERGIC RHINITIS 05/15/2006   Past Medical History:  Diagnosis Date   Abdominal pain 05/07/2017   Abdominal pain, epigastric 02/28/2007   Qualifier: Diagnosis of  By: Blossom Hoops MD, Luis     Acute left-sided low back pain with left-sided sciatica 01/14/2015   Acute left-sided low back pain without  sciatica 05/09/2017   ALLERGIC RHINITIS 05/15/2006   Qualifier: Diagnosis of  By: Daine Gip     Allergy    Arm numbness 03/22/2020   BACK PAIN 10/31/2006   Qualifier: Diagnosis of  By: Yetta Barre CMA, Chemira     Bilateral lower extremity edema 10/19/2019   Bronchitis 09/14/2021   Chronic pain of right knee 11/11/2018   Chronic  venous insufficiency 01/13/2019   Controlled type 2 diabetes mellitus with chronic kidney disease, without long-term current use of insulin (HCC) 09/20/2020   CTS (carpal tunnel syndrome) 03/17/2013   Diabetes mellitus without complication (HCC)    no meds, only check bs   Elevated CK 06/07/2021   Essential hypertension 11/05/2007   Qualifier: Diagnosis of  By: Janit Bern     Fatigue 09/20/2020   GAS/BLOATING 02/28/2007   Qualifier: Diagnosis of  By: Blossom Hoops MD, Luis     GASTROENTERITIS 02/28/2007   Qualifier: Diagnosis of  By: Blossom Hoops MD, Luis     Greater trochanteric bursitis of left hip 05/09/2017   Hyperglycemia 07/17/2012   Hyperlipidemia    Hyperlipidemia associated with type 2 diabetes mellitus (HCC) 10/19/2019   HYPERTENSION, BENIGN ESSENTIAL 10/31/2006   Qualifier: Diagnosis of  By: Yetta Barre CMA, Chemira     KNEE PAIN 01/30/2007   Qualifier: Diagnosis of  By: Blossom Hoops MD, Luis     Lymphocytosis 11/06/2011   MURMUR 11/20/2007   Qualifier: Diagnosis of  By: Janit Bern     Neck pain 06/07/2021   Palpitations 09/20/2020   Primary hypertension 03/22/2020   SINUSITIS- ACUTE-NOS 04/02/2007   Qualifier: Diagnosis of  By: Blossom Hoops MD, Luis     Uncontrolled type 2 diabetes mellitus with hyperglycemia (HCC) 01/30/2019   Urinary frequency 07/17/2012   Urinary incontinence 11/11/2018   Varicose 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 11/11/2018   Vitamin D deficiency 03/22/2020   Past Surgical History:  Procedure Laterality Date   UTERINE FIBROID SURGERY     Social History   Tobacco Use   Smoking status: Never   Smokeless tobacco: Never  Vaping Use   Vaping status: Never Used  Substance Use Topics   Alcohol use: No   Drug use: No   Social History   Socioeconomic History   Marital status: Divorced    Spouse name: Not on file   Number of children: Not on file   Years of education: Not on file   Highest education level: Not on file  Occupational History   Occupation:  [employer]     Comment: [employer]  Tobacco Use   Smoking status: Never   Smokeless tobacco: Never  Vaping Use   Vaping status: Never Used  Substance and Sexual Activity   Alcohol use: No   Drug use: No   Sexual activity: Yes  Other Topics Concern   Not on file  Social History Narrative   Not on file   Social Determinants of Health   Financial Resource Strain: Not on file  Food Insecurity: Not on file  Transportation Needs: Not on file  Physical Activity: Not on file  Stress: Not on file  Social Connections: Not on file  Intimate Partner Violence: Not on file   Family Status  Relation Name Status   Mother  Deceased   Father  Deceased at age 3       MI   Sister  Alive   Sister  Deceased at age 53   Brother  Alive   Brother  Alive   Brother  Alive   Brother  Alive   Neg Hx  (Not Specified)  No partnership data on file   Family History  Problem Relation Age of Onset   Dementia Mother    Hypertension Father    Diabetes Father    Heart attack Father    Diabetes Sister        she had stomach pain-- cause unknown   Clotting disorder Brother    Colon cancer Neg Hx    Esophageal cancer Neg Hx    Stomach cancer Neg Hx    Rectal cancer Neg Hx    Allergies  Allergen Reactions   Metformin And Related    Pravachol [Pravastatin Sodium] Other (See Comments)    Muscle aches   Simvastatin     "leg pains"      Review of Systems  Constitutional:  Negative for fever and malaise/fatigue.  HENT:  Negative for congestion.   Eyes:  Negative for blurred vision.  Respiratory:  Negative for cough and shortness of breath.   Cardiovascular:  Negative for chest pain, palpitations and leg swelling.  Gastrointestinal:  Negative for abdominal pain, blood in stool, nausea and vomiting.  Genitourinary:  Negative for dysuria and frequency.  Musculoskeletal:  Negative for back pain and falls.  Skin:  Negative for rash.  Neurological:  Negative for dizziness, loss of  consciousness and headaches.  Endo/Heme/Allergies:  Negative for environmental allergies.  Psychiatric/Behavioral:  Negative for depression. The patient is not nervous/anxious.       Objective:     BP 110/80 (BP Location: Left Arm, Patient Position: Sitting, Cuff Size: Normal)   Pulse 80   Temp 98.9 F (37.2 C) (Oral)   Resp 18   Ht 5\' 8"  (1.727 m)   Wt 192 lb 3.2 oz (87.2 kg)   SpO2 99%   BMI 29.22 kg/m  BP Readings from Last 3 Encounters:  11/19/22 110/80  08/28/22 118/70  08/17/22 (!) 140/90   Wt Readings from Last 3 Encounters:  11/19/22 192 lb 3.2 oz (87.2 kg)  08/28/22 195 lb 12.8 oz (88.8 kg)  08/17/22 195 lb 12.8 oz (88.8 kg)   SpO2 Readings from Last 3 Encounters:  11/19/22 99%  08/28/22 97%  08/17/22 98%      Physical Exam Vitals and nursing note reviewed.  Constitutional:      General: She is not in acute distress.    Appearance: Normal appearance. She is well-developed.  HENT:     Head: Normocephalic and atraumatic.     Right Ear: Tympanic membrane, ear canal and external ear normal. There is no impacted cerumen.     Left Ear: Tympanic membrane, ear canal and external ear normal. There is no impacted cerumen.     Nose: Nose normal.     Mouth/Throat:     Mouth: Mucous membranes are moist.     Pharynx: Oropharynx is clear. No oropharyngeal exudate or posterior oropharyngeal erythema.  Eyes:     General: No scleral icterus.       Right eye: No discharge.        Left eye: No discharge.     Conjunctiva/sclera: Conjunctivae normal.     Pupils: Pupils are equal, round, and reactive to light.  Neck:     Thyroid: No thyromegaly or thyroid tenderness.     Vascular: No JVD.  Cardiovascular:     Rate and Rhythm: Normal rate and regular rhythm.     Heart sounds: Normal heart sounds.  No murmur heard. Pulmonary:     Effort: Pulmonary effort is normal. No respiratory distress.     Breath sounds: Normal breath sounds.  Abdominal:     General: Bowel sounds  are normal. There is no distension.     Palpations: Abdomen is soft. There is no mass.     Tenderness: There is no abdominal tenderness. There is no guarding or rebound.  Genitourinary:    Vagina: Normal.  Musculoskeletal:        General: Normal range of motion.     Cervical back: Normal range of motion and neck supple.     Right lower leg: No edema.     Left lower leg: No edema.  Lymphadenopathy:     Cervical: No cervical adenopathy.  Skin:    General: Skin is warm and dry.     Findings: No erythema or rash.  Neurological:     Mental Status: She is alert and oriented to person, place, and time.     Cranial Nerves: No cranial nerve deficit.     Deep Tendon Reflexes: Reflexes are normal and symmetric.  Psychiatric:        Mood and Affect: Mood normal.        Behavior: Behavior normal.        Thought Content: Thought content normal.        Judgment: Judgment normal.      No results found for any visits on 11/19/22.  Last CBC Lab Results  Component Value Date   WBC 5.0 08/10/2022   HGB 14.9 08/10/2022   HCT 44.4 08/10/2022   MCV 93.3 08/10/2022   MCH 32.3 01/30/2019   RDW 13.9 08/10/2022   PLT 189.0 08/10/2022   Last metabolic panel Lab Results  Component Value Date   GLUCOSE 96 08/10/2022   NA 140 08/10/2022   K 3.7 08/10/2022   CL 102 08/10/2022   CO2 28 08/10/2022   BUN 14 08/10/2022   CREATININE 0.83 08/10/2022   GFR 75.32 08/10/2022   CALCIUM 9.6 08/10/2022   PROT 7.4 08/10/2022   ALBUMIN 4.5 08/10/2022   BILITOT 0.8 08/10/2022   ALKPHOS 69 08/10/2022   AST 28 08/10/2022   ALT 21 08/10/2022   Last lipids Lab Results  Component Value Date   CHOL 210 (H) 08/10/2022   HDL 80.40 08/10/2022   LDLCALC 119 (H) 08/10/2022   LDLDIRECT 120.9 08/02/2011   TRIG 50.0 08/10/2022   CHOLHDL 3 08/10/2022   Last hemoglobin A1c Lab Results  Component Value Date   HGBA1C 6.1 08/10/2022   Last thyroid functions Lab Results  Component Value Date   TSH 2.98  09/20/2020   T4TOTAL 7.5 01/30/2019   Last vitamin D Lab Results  Component Value Date   VD25OH 41.20 09/20/2020   Last vitamin B12 and Folate Lab Results  Component Value Date   VITAMINB12 637 09/20/2020   FOLATE >24.0 01/30/2019      The 10-year ASCVD risk score (Arnett DK, et al., 2019) is: 11.6%    Assessment & Plan:   Problem List Items Addressed This Visit       Unprioritized   Urinary incontinence    Referral pending      Uncontrolled type 2 diabetes mellitus with hyperglycemia (HCC)    hgba1c to be checked , minimize simple carbs. Increase exercise as tolerated. Continue current meds       Relevant Orders   Comprehensive metabolic panel   Hemoglobin A1c   Microalbumin / creatinine urine  ratio   Primary hypertension    Well controlled, no changes to meds. Encouraged heart healthy diet such as the DASH diet and exercise as tolerated.        Relevant Orders   Lipid panel   CBC with Differential/Platelet   Comprehensive metabolic panel   Preventative health care - Primary    Ghm utd Check labs  See AVS  Health Maintenance  Topic Date Due   DTaP/Tdap/Td (1 - Tdap) Never done   Zoster Vaccines- Shingrix (1 of 2) Never done   PAP SMEAR-Modifier  12/27/2018   OPHTHALMOLOGY EXAM  04/08/2019   COVID-19 Vaccine (4 - 2023-24 season) 12/22/2021   FOOT EXAM  06/19/2022   INFLUENZA VACCINE  11/22/2022   HEMOGLOBIN A1C  02/09/2023   MAMMOGRAM  04/03/2023   Diabetic kidney evaluation - Urine ACR  05/05/2023   Colonoscopy  05/12/2023   Diabetic kidney evaluation - eGFR measurement  08/10/2023   Hepatitis C Screening  Completed   HIV Screening  Completed   HPV VACCINES  Aged Out         Hyperlipidemia associated with type 2 diabetes mellitus (HCC)    Encourage heart healthy diet such as MIND or DASH diet, increase exercise, avoid trans fats, simple carbohydrates and processed foods, consider a krill or fish or flaxseed oil cap daily.        Relevant  Orders   Lipid panel   CBC with Differential/Platelet   Comprehensive metabolic panel   Other Visit Diagnoses     Elevated CPK       Relevant Orders   CK (Creatine Kinase)       No follow-ups on file.    Donato Schultz, DO

## 2022-11-19 NOTE — Assessment & Plan Note (Signed)
Referral pending.

## 2022-11-19 NOTE — Assessment & Plan Note (Signed)
Encourage heart healthy diet such as MIND or DASH diet, increase exercise, avoid trans fats, simple carbohydrates and processed foods, consider a krill or fish or flaxseed oil cap daily.  °

## 2022-11-19 NOTE — Assessment & Plan Note (Signed)
hgba1c to be checked, minimize simple carbs. Increase exercise as tolerated. Continue current meds  

## 2022-11-19 NOTE — Assessment & Plan Note (Signed)
Ghm utd Check labs  See AVS  Health Maintenance  Topic Date Due   DTaP/Tdap/Td (1 - Tdap) Never done   Zoster Vaccines- Shingrix (1 of 2) Never done   PAP SMEAR-Modifier  12/27/2018   OPHTHALMOLOGY EXAM  04/08/2019   COVID-19 Vaccine (4 - 2023-24 season) 12/22/2021   FOOT EXAM  06/19/2022   INFLUENZA VACCINE  11/22/2022   HEMOGLOBIN A1C  02/09/2023   MAMMOGRAM  04/03/2023   Diabetic kidney evaluation - Urine ACR  05/05/2023   Colonoscopy  05/12/2023   Diabetic kidney evaluation - eGFR measurement  08/10/2023   Hepatitis C Screening  Completed   HIV Screening  Completed   HPV VACCINES  Aged Out

## 2022-12-05 ENCOUNTER — Encounter: Payer: Self-pay | Admitting: Obstetrics and Gynecology

## 2022-12-05 ENCOUNTER — Ambulatory Visit: Payer: 59 | Admitting: Obstetrics and Gynecology

## 2022-12-05 VITALS — BP 132/84 | HR 67 | Ht 67.32 in | Wt 194.0 lb

## 2022-12-05 DIAGNOSIS — R350 Frequency of micturition: Secondary | ICD-10-CM

## 2022-12-05 DIAGNOSIS — N3281 Overactive bladder: Secondary | ICD-10-CM | POA: Diagnosis not present

## 2022-12-05 LAB — POCT URINALYSIS DIPSTICK
Bilirubin, UA: NEGATIVE
Blood, UA: NEGATIVE
Glucose, UA: NEGATIVE
Ketones, UA: NEGATIVE
Leukocytes, UA: NEGATIVE
Nitrite, UA: NEGATIVE
Protein, UA: NEGATIVE
Spec Grav, UA: 1.015 (ref 1.010–1.025)
Urobilinogen, UA: 0.2 E.U./dL
pH, UA: 7 (ref 5.0–8.0)

## 2022-12-05 NOTE — Patient Instructions (Addendum)
Today we talked about ways to manage bladder urgency such as altering your diet to avoid irritative beverages and foods (bladder diet) as well as attempting to decrease stress and other exacerbating factors.    The Most Bothersome Foods* The Least Bothersome Foods*  Coffee - Regular & Decaf Tea - caffeinated Carbonated beverages - cola, non-colas, diet & caffeine-free Alcohols - Beer, Red Wine, White Wine, [address] - Grapefruit, [place], Orange, [employer] - Cranberry, Grapefruit, Orange, Pineapple Vegetables - Tomato & Tomato Products Flavor Enhancers - Hot peppers, Spicy foods, Chili, Horseradish, Vinegar, Monosodium glutamate (MSG) Artificial Sweeteners - NutraSweet, Sweet 'N Low, Equal (sweetener), Saccharin Ethnic foods - [place], [place], [place] food [employer] - low-fat & whole Fruits - Bananas, Blueberries, Honeydew melon, Pears, Raisins, Watermelon Vegetables - Broccoli, [address] Sprouts, [place], Carrots, Cauliflower, [place], Cucumber, Mushrooms, Peas, Radishes, Squash, Zucchini, White potatoes, Sweet potatoes & yams Poultry - Chicken, Eggs, [place], [employer] - Beef, Diplomatic Services operational officer, Lamb Seafood - Shrimp, Gray fish, Salmon Grains - Oat, Rice Snacks - Pretzels, Popcorn  *Lenward Chancellor et al. Diet and its role in interstitial cystitis/bladder pain syndrome (IC/BPS) and comorbid conditions. [employer]. [employer]JU Int. 2012 Jan 11.    Try to stop drinking 2-3 hours prior to bedtime.   We discussed the symptoms of overactive bladder (OAB), which include urinary urgency, urinary frequency, night-time urination, with or without urge incontinence.  We discussed management including behavioral therapy (decreasing bladder irritants by following a bladder diet, urge suppression strategies, timed voids, bladder retraining), physical therapy, medication; and for refractory cases posterior tibial nerve stimulation, sacral neuromodulation, and intravesical botulinum toxin injection.

## 2022-12-05 NOTE — Progress Notes (Signed)
[facility] New Patient Evaluation and Consultation  Referring Provider: Zola Button, Grayling Congress, * PCP: Zola Button, Grayling Congress, DO Date of Service: 12/05/2022  SUBJECTIVE Chief Complaint: New Patient (Initial Visit) (Angela Garner is a 62 y.o. female here for a consult for urinary incontinence./)  History of Present Illness: Angela Garner is a 62 y.o. Black or African-American female seen in consultation at the request of Dr. Zola Button for evaluation of incontinence.    Review of records significant for: Has symptoms of mixed stress and urge incontinence  Last Hgb A1c was 6.0%  Urinary Symptoms: Leaks urine with going from sitting to standing, with a full bladder, with movement to the bathroom, with urgency, and while asleep Leaks 4-5 time(s) per day.  Pad use: 3-4 pads/ diapers per day.   She is bothered by her UI symptoms. She has tried oxybutynin and myrbetriq and did not see improvement. Also noticed dry mouth.  Tried pelvic PT many years ago.  She went to [facility] and tried PTNS which she did not see improvement.   Day time voids 5.  Nocturia: 6 times per night to void. Voiding dysfunction: she does not empty her bladder well.  does not use a catheter to empty bladder.  When urinating, she feels dribbling after finishing and the need to urinate multiple times in a row Drinks: water, caffeine free green tea per day (cannot tolerate caffeine)  UTIs:  0  UTI's in the last year.   Denies history of blood in urine and kidney or bladder stones  Pelvic Organ Prolapse Symptoms:                  She Denies a feeling of a bulge the vaginal area.   Bowel Symptom: Bowel movements: 1-2 time(s) per day Stool consistency: soft  Straining: no.  Splinting: no.  Incomplete evacuation: no.  She Denies accidental bowel leakage / fecal incontinence Bowel regimen: fiber   Sexual Function Sexually active: no.    Pelvic Pain Denies pelvic pain   Past Medical  History:  Past Medical History:  Diagnosis Date   Abdominal pain 05/07/2017   Abdominal pain, epigastric 02/28/2007   Qualifier: Diagnosis of  By: Blossom Hoops MD, Luis     Acute left-sided low back pain with left-sided sciatica 01/14/2015   Acute left-sided low back pain without sciatica 05/09/2017   ALLERGIC RHINITIS 05/15/2006   Qualifier: Diagnosis of  By: Daine Gip     Allergy    Arm numbness 03/22/2020   BACK PAIN 10/31/2006   Qualifier: Diagnosis of  By: Yetta Barre CMA, Chemira     Bilateral lower extremity edema 10/19/2019   Bronchitis 09/14/2021   Chronic pain of right knee 11/11/2018   Chronic venous insufficiency 01/13/2019   Controlled type 2 diabetes mellitus with chronic kidney disease, without long-term current use of insulin (HCC) 09/20/2020   CTS (carpal tunnel syndrome) 03/17/2013   Diabetes mellitus without complication (HCC)    no meds, only check bs   Elevated CK 06/07/2021   Essential hypertension 11/05/2007   Qualifier: Diagnosis of  By: Janit Bern     Fatigue 09/20/2020   GAS/BLOATING 02/28/2007   Qualifier: Diagnosis of  By: Blossom Hoops MD, Luis     GASTROENTERITIS 02/28/2007   Qualifier: Diagnosis of  By: Blossom Hoops MD, Luis     Greater trochanteric bursitis of left hip 05/09/2017   Hyperglycemia 07/17/2012   Hyperlipidemia    Hyperlipidemia associated with type 2 diabetes mellitus (  HCC) 10/19/2019   HYPERTENSION, BENIGN ESSENTIAL 10/31/2006   Qualifier: Diagnosis of  By: Yetta Barre CMA, Chemira     KNEE PAIN 01/30/2007   Qualifier: Diagnosis of  By: Blossom Hoops MD, Luis     Lymphocytosis 11/06/2011   MURMUR 11/20/2007   Qualifier: Diagnosis of  By: Janit Bern     Neck pain 06/07/2021   Palpitations 09/20/2020   Primary hypertension 03/22/2020   SINUSITIS- ACUTE-NOS 04/02/2007   Qualifier: Diagnosis of  By: Blossom Hoops MD, Luis     Uncontrolled type 2 diabetes mellitus with hyperglycemia (HCC) 01/30/2019   Urinary frequency 07/17/2012   Urinary incontinence 11/11/2018    Varicose 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 11/11/2018   Vitamin D deficiency 03/22/2020     Past Surgical History:   Past Surgical History:  Procedure Laterality Date   UTERINE FIBROID SURGERY       Past OB/GYN History: OB History  Gravida Para Term Preterm AB Living  0 0 0 0 0 0  SAB IAB Ectopic Multiple Live Births  0 0 0 0 0    Menopausal: Yes, Denies vaginal bleeding since menopause Last pap smear was 2023- negative per patient.  Any history of abnormal pap smears: no.   Medications: She has a current medication list which includes the following prescription(s): amlodipine, blood glucose meter kit and supplies, cyclobenzaprine, trulicity, famotidine, fluticasone, levocetirizine, multivitamin-iron-minerals-folic acid, NONFORMULARY OR COMPOUNDED ITEM, olmesartan, pantoprazole, and rosuvastatin.   Allergies: Patient is allergic to metformin and related, pravachol [pravastatin sodium], and simvastatin.   Social History:  Social History   Tobacco Use   Smoking status: Never   Smokeless tobacco: Never  Vaping Use   Vaping status: Never Used  Substance Use Topics   Alcohol use: No   Drug use: No    Relationship status: divorced She lives with family member.   She is employed as a Engineer, structural. Regular exercise: Yes:   History of abuse: No  Family History:   Family History  Problem Relation Age of Onset   Dementia Mother    Hypertension Father    Diabetes Father    Heart attack Father    Diabetes Sister        she had stomach pain-- cause unknown   Clotting disorder Brother    Colon cancer Neg Hx    Esophageal cancer Neg Hx    Stomach cancer Neg Hx    Rectal cancer Neg Hx      Review of Systems: Review of Systems  Constitutional:  Negative for fever, malaise/fatigue and weight loss.  Respiratory:  Negative for cough, shortness of breath and wheezing.   Cardiovascular:  Negative for chest pain, palpitations and leg swelling.  Gastrointestinal:   Negative for abdominal pain and blood in stool.  Genitourinary:  Negative for dysuria.  Musculoskeletal:  Negative for myalgias.  Skin:  Negative for rash.  Neurological:  Negative for dizziness and headaches.  Endo/Heme/Allergies:  Does not bruise/bleed easily.  Psychiatric/Behavioral:  Negative for depression. The patient is not nervous/anxious.      OBJECTIVE Physical Exam: Vitals:   12/05/22 0901  BP: 132/84  Pulse: 67  Weight: 194 lb (88 kg)  Height: 5' 7.32" (1.71 m)    Physical Exam Constitutional:      General: She is not in acute distress. Pulmonary:     Effort: Pulmonary effort is normal.  Abdominal:     General: There is no distension.     Palpations: Abdomen is soft.  Tenderness: There is no abdominal tenderness. There is no rebound.  Musculoskeletal:        General: No swelling. Normal range of motion.  Skin:    General: Skin is warm and dry.     Findings: No rash.  Neurological:     Mental Status: She is alert and oriented to person, place, and time.  Psychiatric:        Mood and Affect: Mood normal.        Behavior: Behavior normal.      GU / Detailed Urogynecologic Evaluation:  Pelvic Exam: Normal external female genitalia; Bartholin's and Skene's glands normal in appearance; urethral meatus normal in appearance, no urethral masses or discharge.   CST: negative Speculum exam reveals normal vaginal mucosa with atrophy. Cervix normal appearance. Uterus normal single, nontender. Adnexa no mass, fullness, tenderness.     Pelvic floor strength II/V  Pelvic floor musculature: Right levator non-tender, Right obturator non-tender, Left levator non-tender, Left obturator non-tender  POP-Q:  Deferred- no prolapse  Rectal Exam:  Normal external rectum  Post-Void Residual (PVR) by Bladder Scan: In order to evaluate bladder emptying, we discussed obtaining a postvoid residual and she agreed to this procedure.  Procedure: The ultrasound unit was  placed on the patient's abdomen in the suprapubic region after the patient had voided. A PVR of 0 ml was obtained by bladder scan.  Laboratory Results: POC urine: negative   ASSESSMENT AND PLAN Ms. Angela Garner is a 62 y.o. with:  1. Overactive bladder   2. Urinary frequency    - We discussed the symptoms of overactive bladder (OAB), which include urinary urgency, urinary frequency, nocturia, with or without urge incontinence.  While we do not know the exact etiology of OAB, several treatment options exist. We discussed management including behavioral therapy (decreasing bladder irritants, urge suppression strategies, timed voids, bladder retraining), physical therapy, medication; for refractory cases posterior tibial nerve stimulation, sacral neuromodulation, and intravesical botulinum toxin injection.  - She is potentially interested in botox but wants to try pelvic physical therapy again first since it has been several years. Referral placed.  - Handouts provided on Botox and SNM.  - For refractory OAB we reviewed the procedure for intravesical Botox injection with cystoscopy in the office and reviewed the risks, benefits and alternatives of treatment including but not limited to infection, need for self-catheterization and need for repeat therapy.  We discussed that there is a 5-15% chance of needing to catheterize with Botox and that this usually resolves in a few months; however can persist for longer periods of time.  Typically Botox injections would need to be repeated every 3-12 months since this is not a permanent therapy.  - We discussed the role of sacral neuromodulation and how it works. It requires a test phase, and documentation of bladder function via diary. After a successful test period, a permanent wire and generator are placed in the OR.   The goal of this therapy is at least a 50% improvement in symptoms. She has already tried PTNS.  Return after PT if needed  Marguerita Beards,  MD

## 2022-12-14 ENCOUNTER — Other Ambulatory Visit: Payer: Self-pay | Admitting: Family Medicine

## 2022-12-14 DIAGNOSIS — R1013 Epigastric pain: Secondary | ICD-10-CM

## 2023-01-23 ENCOUNTER — Other Ambulatory Visit: Payer: Self-pay | Admitting: Family Medicine

## 2023-01-23 DIAGNOSIS — E1165 Type 2 diabetes mellitus with hyperglycemia: Secondary | ICD-10-CM

## 2023-02-09 ENCOUNTER — Other Ambulatory Visit: Payer: Self-pay | Admitting: Family Medicine

## 2023-02-09 DIAGNOSIS — E785 Hyperlipidemia, unspecified: Secondary | ICD-10-CM

## 2023-02-09 DIAGNOSIS — T7840XA Allergy, unspecified, initial encounter: Secondary | ICD-10-CM

## 2023-02-09 DIAGNOSIS — I10 Essential (primary) hypertension: Secondary | ICD-10-CM

## 2023-02-26 ENCOUNTER — Telehealth: Payer: Self-pay

## 2023-02-26 NOTE — Telephone Encounter (Signed)
PA approved. Effective 02/26/2023 to 02/26/2024

## 2023-02-26 NOTE — Telephone Encounter (Signed)
PA initiated via Covermymeds; KEY; [id number]. Awaiting determination.

## 2023-03-11 NOTE — Therapy (Unsigned)
OUTPATIENT PHYSICAL THERAPY FEMALE PELVIC EVALUATION   Patient Name: Angela Garner MRN: [id number] DOB:1960/11/18, 62 y.o., female Today's Date: 03/12/2023  END OF SESSION:  PT End of Session - 03/12/23 1047     Visit Number 1    Date for PT Re-Evaluation 09/09/23    Authorization Type [employer]    PT Start Time 1040    PT Stop Time 1120    PT Time Calculation (min) 40 min    Activity Tolerance Patient tolerated treatment well    Behavior During Therapy WFL for tasks assessed/performed             Past Medical History:  Diagnosis Date   Abdominal pain 05/07/2017   Abdominal pain, epigastric 02/28/2007   Qualifier: Diagnosis of  By: Blossom Hoops MD, Luis     Acute left-sided low back pain with left-sided sciatica 01/14/2015   Acute left-sided low back pain without sciatica 05/09/2017   ALLERGIC RHINITIS 05/15/2006   Qualifier: Diagnosis of  By: Daine Gip     Allergy    Arm numbness 03/22/2020   BACK PAIN 10/31/2006   Qualifier: Diagnosis of  By: Yetta Barre CMA, Chemira     Bilateral lower extremity edema 10/19/2019   Bronchitis 09/14/2021   Chronic pain of right knee 11/11/2018   Chronic venous insufficiency 01/13/2019   Controlled type 2 diabetes mellitus with chronic kidney disease, without long-term current use of insulin (HCC) 09/20/2020   CTS (carpal tunnel syndrome) 03/17/2013   Diabetes mellitus without complication (HCC)    no meds, only check bs   Elevated CK 06/07/2021   Essential hypertension 11/05/2007   Qualifier: Diagnosis of  By: Janit Bern     Fatigue 09/20/2020   GAS/BLOATING 02/28/2007   Qualifier: Diagnosis of  By: Blossom Hoops MD, Luis     GASTROENTERITIS 02/28/2007   Qualifier: Diagnosis of  By: Blossom Hoops MD, Luis     Greater trochanteric bursitis of left hip 05/09/2017   Hyperglycemia 07/17/2012   Hyperlipidemia    Hyperlipidemia associated with type 2 diabetes mellitus (HCC) 10/19/2019   HYPERTENSION, BENIGN ESSENTIAL 10/31/2006   Qualifier: Diagnosis of  By:  Yetta Barre CMA, Chemira     KNEE PAIN 01/30/2007   Qualifier: Diagnosis of  By: Blossom Hoops MD, Luis     Lymphocytosis 11/06/2011   MURMUR 11/20/2007   Qualifier: Diagnosis of  By: Janit Bern     Neck pain 06/07/2021   Palpitations 09/20/2020   Primary hypertension 03/22/2020   SINUSITIS- ACUTE-NOS 04/02/2007   Qualifier: Diagnosis of  By: Blossom Hoops MD, Luis     Uncontrolled type 2 diabetes mellitus with hyperglycemia (HCC) 01/30/2019   Urinary frequency 07/17/2012   Urinary incontinence 11/11/2018   Varicose 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 11/11/2018   Vitamin D deficiency 03/22/2020   Past Surgical History:  Procedure Laterality Date   UTERINE FIBROID SURGERY     Patient Active Problem List   Diagnosis Date Noted   Dyspepsia 08/28/2022   RUQ pain 08/28/2022   Bulging lumbar disc 08/10/2022   Arthritis of both knees 08/10/2022   Sore throat 02/27/2022   Preventative health care 01/18/2022   Allergy 11/29/2021   Diabetes mellitus without complication (HCC) 11/29/2021   Bronchitis 09/14/2021   Elevated CK 06/07/2021   Neck pain 06/07/2021   Controlled type 2 diabetes mellitus with chronic kidney disease, without long-term current use of insulin (HCC) 09/20/2020   Fatigue 09/20/2020   Palpitations 09/20/2020   Arm numbness 03/22/2020   Primary hypertension  03/22/2020   Vitamin D deficiency 03/22/2020   Hyperlipidemia associated with type 2 diabetes mellitus (HCC) 10/19/2019   Bilateral lower extremity edema 10/19/2019   Uncontrolled type 2 diabetes mellitus with hyperglycemia (HCC) 01/30/2019   Hyperlipidemia    Chronic venous insufficiency 01/13/2019   Varicose 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 11/11/2018   Chronic pain of right knee 11/11/2018   Urinary incontinence 11/11/2018   Greater trochanteric bursitis of left hip 05/09/2017   Acute left-sided low back pain without sciatica 05/09/2017   Abdominal pain 05/07/2017   Acute left-sided low back pain with  left-sided sciatica 01/14/2015   CTS (carpal tunnel syndrome) 03/17/2013   Hyperglycemia 07/17/2012   Urinary frequency 07/17/2012   Lymphocytosis 11/06/2011   MURMUR 11/20/2007   Essential hypertension 11/05/2007   SINUSITIS- ACUTE-NOS 04/02/2007   GASTROENTERITIS 02/28/2007   GAS/BLOATING 02/28/2007   ABDOMINAL PAIN, EPIGASTRIC 02/28/2007   KNEE PAIN 01/30/2007   HYPERTENSION, BENIGN ESSENTIAL 10/31/2006   Backache 10/31/2006   Allergic rhinitis 05/15/2006    PCP: Donato Schultz DO  REFERRING PROVIDER: Marguerita Beards, MD   REFERRING DIAG: N32.81 (ICD-10-CM) - Overactive bladder   THERAPY DIAG:  Muscle weakness (generalized)  Other lack of coordination  Rationale for Evaluation and Treatment: Rehabilitation  ONSET DATE: 2019  SUBJECTIVE:                                                                                                                                                                                           SUBJECTIVE STATEMENT: Patient has to go to the bathroom a lot and has to wear a pad. Patient has had treatment in the past and was not successful.  Fluid intake: Yes:    water, caffeine free green tea per day (cannot tolerate caffeine)   PAIN:  Are you having pain? No  PRECAUTIONS: None  RED FLAGS: None   WEIGHT BEARING RESTRICTIONS: No  FALLS:  Has patient fallen in last 6 months? No  LIVING ENVIRONMENT: Lives with: lives with their family  OCCUPATION: caregiver  PLOF: Independent  PATIENT GOALS: reduce the amount to urinate  PERTINENT HISTORY:  Diabetes; Hypertension; Uterine Fibroid surgery  BOWEL MOVEMENT: no issues  URINATION: Pain with urination: No Fully empty bladder: NoWhen urinating, she feels dribbling after finishing and the need to urinate multiple times in a row  Stream: Strong Urgency: Yes: has to get to the bathroom Frequency: Day time voids 5.  Nocturia: 6 times per night to void. Sometimes tries to  get up to urinate at night but has leaked all of the urine so stays in bed.  Leakage: Urge  to void, Walking to the bathroom, and while asleep, sitting to standing, with a full bladder  , leaks 4-5 times per day Pads: Yes: 4-5 pads per day  INTERCOURSE: not active  PREGNANCY: none    OBJECTIVE:  Note: Objective measures were completed at Evaluation unless otherwise noted.  DIAGNOSTIC FINDINGS:  Pelvic floor strength II/V  PVR of 0 ml was obtained by bladder scan.   COGNITION: Overall cognitive status: Within functional limits for tasks assessed     SENSATION: Light touch: Appears intact Proprioception: Appears intact   POSTURE: No Significant postural limitations  PELVIC ALIGNMENT: ASIS are equal  LUMBARAROM/PROM: lumbar ROM is full  LOWER EXTREMITY ROM: full bilateral hip ROM   LOWER EXTREMITY MMT:  MMT Right eval Left eval  Hip extension 4/5 4/5  Hip abduction 3/5 3/5   PALPATION:   General  Decreased opening of the lower rib cage                External Perineal Exam dryness                             Internal Pelvic Floor tightness along the sides of the introitus and along the urethra  Patient confirms identification and approves PT to assess internal pelvic floor and treatment Yes  PELVIC MMT:   MMT eval  Vaginal 2/5 for 1 sec  (Blank rows = not tested)        TONE: average  PROLAPSE: none  TODAY'S TREATMENT:                                                                                                                              DATE: 03/12/23  EVAL See below   PATIENT EDUCATION:  03/12/23 Education details: Access Code: [email or web address] Person educated: Patient Education method: Programmer, multimedia, Demonstration, Actor cues, Verbal cues, and Handouts Education comprehension: verbalized understanding, returned demonstration, verbal cues required, tactile cues required, and needs further education  HOME EXERCISE PROGRAM: 03/12/23 Access Code:  [email or web address] URL: [email or web address] Date: 03/12/2023 Prepared by: Eulis Foster  Program Notes use the estrogen cream daily for 2 weeks then 2 times per week afterwards.   Exercises - Seated Pelvic Floor Contraction  - 3 x daily - 7 x weekly - 1 sets - 5 reps - 5 sec hold  ASSESSMENT:  CLINICAL IMPRESSION: Patient is a 62 y.o. female who was seen today for physical therapy evaluation and treatment for Overactive bladder. Patient reports she has to urinate frequently  and does not feel she has fully emptied her bladder. She will urinate multiple times in a row. She leaks urine with urge to void, walking to the bathroom, and while asleep, sitting to standing, with a full bladder. She leaks 4-5 times per day. She wears  4-5 pads per day. She will void 6 times during the night. She has strong urgency to urinate. Pelvic floor strength is 2/5  holding for 1 seconds. She has tightness on the sides of her introitus. Patient does her vaginal dryness and will start her estrogen cream that she has. Patient will benefit from skilled therapy to improve pelvic floor strength and reduce urgency.   OBJECTIVE IMPAIRMENTS: decreased coordination, decreased strength, and increased fascial restrictions.   ACTIVITY LIMITATIONS: continence and toileting  PARTICIPATION LIMITATIONS: shopping, community activity, and occupation  PERSONAL FACTORS: Time since onset of injury/illness/exacerbation are also affecting patient's functional outcome.   REHAB POTENTIAL: Excellent  CLINICAL DECISION MAKING: Stable/uncomplicated  EVALUATION COMPLEXITY: Low   GOALS: Goals reviewed with patient? Yes  SHORT TERM GOALS: Target date: 04/07/23  Patient independent with initial HEP for pelvic floor strengthening Baseline: Goal status: INITIAL  2.  Patient educated on vaginal moisturizers and vaginal health.  Baseline:  Goal status: INITIAL  3.  Patient able to perform diaphragmatic breathing to relax the  pelvic floor.  Baseline:  Goal status: INITIAL   LONG TERM GOALS: Target date: 09/06/2023  Patient independent with advanced HEP for pelvic floor, core and hip strength to improve urinary frequency.  Baseline:  Goal status: INITIAL  2.  Patient reports she is wakes up to urinate 3 or less times due to reduction of urgency.  Baseline:  Goal status: INITIAL  3.  Patient pelvic floor strength increased >/= 3/5 contracting for 10 seconds so she is able to walk to the bathroom without leaking urine.  Baseline:  Goal status: INITIAL  4.  Patient reports her urinary urgency decreased >/= 60% due to using the urge to void behavioral techniques.  Baseline:  Goal status: INITIAL  5.  Patient reports she is using 0-1 pad per day due to reduction of urinary leakage.  Baseline:  Goal status: INITIAL   PLAN:  PT FREQUENCY: 1x/week  PT DURATION: 6 months  PLANNED INTERVENTIONS: 97110-Therapeutic exercises, 97530- Therapeutic activity, 97112- Neuromuscular re-education, [id number]- Manual therapy, Patient/Family education, Dry Needling, Cryotherapy, Moist heat, and Biofeedback  PLAN FOR NEXT SESSION: manual work to the pelvic floor to work on tissue recruitment, diaphragmatic breathing to expand the lower rib cage, Abdominal contraction, timed voiding   Eulis Foster, PT 03/12/23 3:53 PM

## 2023-03-12 ENCOUNTER — Encounter: Payer: Self-pay | Admitting: Physical Therapy

## 2023-03-12 ENCOUNTER — Encounter: Payer: 59 | Attending: Obstetrics and Gynecology | Admitting: Physical Therapy

## 2023-03-12 ENCOUNTER — Other Ambulatory Visit: Payer: Self-pay

## 2023-03-12 DIAGNOSIS — N3281 Overactive bladder: Secondary | ICD-10-CM | POA: Insufficient documentation

## 2023-03-12 DIAGNOSIS — R278 Other lack of coordination: Secondary | ICD-10-CM | POA: Insufficient documentation

## 2023-03-12 DIAGNOSIS — M6281 Muscle weakness (generalized): Secondary | ICD-10-CM | POA: Insufficient documentation

## 2023-03-19 ENCOUNTER — Encounter: Payer: 59 | Admitting: Physical Therapy

## 2023-03-19 ENCOUNTER — Encounter: Payer: Self-pay | Admitting: Physical Therapy

## 2023-03-19 DIAGNOSIS — R278 Other lack of coordination: Secondary | ICD-10-CM | POA: Diagnosis not present

## 2023-03-19 DIAGNOSIS — M6281 Muscle weakness (generalized): Secondary | ICD-10-CM | POA: Diagnosis not present

## 2023-03-19 DIAGNOSIS — N3281 Overactive bladder: Secondary | ICD-10-CM | POA: Diagnosis not present

## 2023-03-19 NOTE — Therapy (Signed)
OUTPATIENT PHYSICAL THERAPY FEMALE PELVIC TREATMENT  Patient Name: Angela Garner MRN: [id number] DOB:[birth year], 62 y.o., female Today's Date: 03/19/2023  END OF SESSION:  PT End of Session - 03/19/23 1037     Visit Number 2    Date for PT Re-Evaluation 09/09/23    Authorization Type [employer]    PT Start Time 1030    PT Stop Time 1115    PT Time Calculation (min) 45 min    Activity Tolerance Patient tolerated treatment well    Behavior During Therapy [facility] for tasks assessed/performed             Past Medical History:  Diagnosis Date   Abdominal pain 05/07/2017   Abdominal pain, epigastric 02/28/2007   Qualifier: Diagnosis of  By: Blossom Hoops MD, Luis     Acute left-sided low back pain with left-sided sciatica 01/14/2015   Acute left-sided low back pain without sciatica 05/09/2017   ALLERGIC RHINITIS 05/15/2006   Qualifier: Diagnosis of  By: Daine Gip     Allergy    Arm numbness 03/22/2020   BACK PAIN 10/31/2006   Qualifier: Diagnosis of  By: Yetta Barre CMA, Chemira     Bilateral lower extremity edema 10/19/2019   Bronchitis 09/14/2021   Chronic pain of right knee 11/11/2018   Chronic venous insufficiency 01/13/2019   Controlled type 2 diabetes mellitus with chronic kidney disease, without long-term current use of insulin (HCC) 09/20/2020   CTS (carpal tunnel syndrome) 03/17/2013   Diabetes mellitus without complication (HCC)    no meds, only check bs   Elevated CK 06/07/2021   Essential hypertension 11/05/2007   Qualifier: Diagnosis of  By: Janit Bern     Fatigue 09/20/2020   GAS/BLOATING 02/28/2007   Qualifier: Diagnosis of  By: Blossom Hoops MD, Luis     GASTROENTERITIS 02/28/2007   Qualifier: Diagnosis of  By: Blossom Hoops MD, Luis     Greater trochanteric bursitis of left hip 05/09/2017   Hyperglycemia 07/17/2012   Hyperlipidemia    Hyperlipidemia associated with type 2 diabetes mellitus (HCC) 10/19/2019   HYPERTENSION, BENIGN ESSENTIAL 10/31/2006   Qualifier: Diagnosis of  By:  Yetta Barre CMA, Chemira     KNEE PAIN 01/30/2007   Qualifier: Diagnosis of  By: Blossom Hoops MD, Luis     Lymphocytosis 11/06/2011   MURMUR 11/20/2007   Qualifier: Diagnosis of  By: Janit Bern     Neck pain 06/07/2021   Palpitations 09/20/2020   Primary hypertension 03/22/2020   SINUSITIS- ACUTE-NOS 04/02/2007   Qualifier: Diagnosis of  By: Blossom Hoops MD, Luis     Uncontrolled type 2 diabetes mellitus with hyperglycemia (HCC) 01/30/2019   Urinary frequency 07/17/2012   Urinary incontinence 11/11/2018   Varicose 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 11/11/2018   Vitamin D deficiency 03/22/2020   Past Surgical History:  Procedure Laterality Date   UTERINE FIBROID SURGERY     Patient Active Problem List   Diagnosis Date Noted   Dyspepsia 08/28/2022   RUQ pain 08/28/2022   Bulging lumbar disc 08/10/2022   Arthritis of both knees 08/10/2022   Sore throat 02/27/2022   Preventative health care 01/18/2022   Allergy 11/29/2021   Diabetes mellitus without complication (HCC) 11/29/2021   Bronchitis 09/14/2021   Elevated CK 06/07/2021   Neck pain 06/07/2021   Controlled type 2 diabetes mellitus with chronic kidney disease, without long-term current use of insulin (HCC) 09/20/2020   Fatigue 09/20/2020   Palpitations 09/20/2020   Arm numbness 03/22/2020   Primary hypertension 03/22/2020  Vitamin D deficiency 03/22/2020   Hyperlipidemia associated with type 2 diabetes mellitus (HCC) 10/19/2019   Bilateral lower extremity edema 10/19/2019   Uncontrolled type 2 diabetes mellitus with hyperglycemia (HCC) 01/30/2019   Hyperlipidemia    Chronic venous insufficiency 01/13/2019   Varicose 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 11/11/2018   Chronic pain of right knee 11/11/2018   Urinary incontinence 11/11/2018   Greater trochanteric bursitis of left hip 05/09/2017   Acute left-sided low back pain without sciatica 05/09/2017   Abdominal pain 05/07/2017   Acute left-sided low back pain with  left-sided sciatica 01/14/2015   CTS (carpal tunnel syndrome) 03/17/2013   Hyperglycemia 07/17/2012   Urinary frequency 07/17/2012   Lymphocytosis 11/06/2011   MURMUR 11/20/2007   Essential hypertension 11/05/2007   SINUSITIS- ACUTE-NOS 04/02/2007   GASTROENTERITIS 02/28/2007   GAS/BLOATING 02/28/2007   ABDOMINAL PAIN, EPIGASTRIC 02/28/2007   KNEE PAIN 01/30/2007   HYPERTENSION, BENIGN ESSENTIAL 10/31/2006   Backache 10/31/2006   Allergic rhinitis 05/15/2006    PCP: Donato Schultz DO  REFERRING PROVIDER: Marguerita Beards, MD   REFERRING DIAG: N32.81 (ICD-10-CM) - Overactive bladder   THERAPY DIAG:  Muscle weakness (generalized)  Other lack of coordination  Rationale for Evaluation and Treatment: Rehabilitation  ONSET DATE: 2019  SUBJECTIVE:                                                                                                                                                                                           SUBJECTIVE STATEMENT: I feel the same Fluid intake: Yes:    water, caffeine free green tea per day (cannot tolerate caffeine)   PAIN:  Are you having pain? No  PRECAUTIONS: None  RED FLAGS: None   WEIGHT BEARING RESTRICTIONS: No  FALLS:  Has patient fallen in last 6 months? No  LIVING ENVIRONMENT: Lives with: lives with their family  OCCUPATION: caregiver  PLOF: Independent  PATIENT GOALS: reduce the amount to urinate  PERTINENT HISTORY:  Diabetes; Hypertension; Uterine Fibroid surgery  BOWEL MOVEMENT: no issues  URINATION: Pain with urination: No Fully empty bladder: NoWhen urinating, she feels dribbling after finishing and the need to urinate multiple times in a row  Stream: Strong Urgency: Yes: has to get to the bathroom Frequency: Day time voids 5.  Nocturia: 6 times per night to void. Sometimes tries to get up to urinate at night but has leaked all of the urine so stays in bed.  Leakage: Urge to void, Walking  to the bathroom, and while asleep, sitting to standing, with a full bladder  , leaks 4-5 times per day Pads: Yes: 4-5  pads per day  INTERCOURSE: not active  PREGNANCY: none    OBJECTIVE:  Note: Objective measures were completed at Evaluation unless otherwise noted.  DIAGNOSTIC FINDINGS:  Pelvic floor strength II/V  PVR of 0 ml was obtained by bladder scan.   COGNITION: Overall cognitive status: Within functional limits for tasks assessed     SENSATION: Light touch: Appears intact Proprioception: Appears intact   POSTURE: No Significant postural limitations  PELVIC ALIGNMENT: ASIS are equal  LUMBARAROM/PROM: lumbar ROM is full  LOWER EXTREMITY ROM: full bilateral hip ROM   LOWER EXTREMITY MMT:  MMT Right eval Left eval  Hip extension 4/5 4/5  Hip abduction 3/5 3/5   PALPATION:   General  Decreased opening of the lower rib cage                External Perineal Exam dryness                             Internal Pelvic Floor tightness along the sides of the introitus and along the urethra  Patient confirms identification and approves PT to assess internal pelvic floor and treatment Yes  PELVIC MMT:   MMT eval  Vaginal 2/5 for 1 sec  (Blank rows = not tested)        TONE: average  PROLAPSE: none  TODAY'S TREATMENT:          03/19/23 Manual: Soft tissue mobilization: Manual work to the diaphragm, along the sides of the rectus, and in the umbilicus to reduce the restrictions Scar tissue mobilization: Scar work to the lower abdomen going through the restrictions and using the suction cup to lift the scar Educated patient on how to perform the scar mobilization at home Myofascial release: Fascial release along the inguinal canal, mesenteric root, lower right abdominal and lateral abdominal going the the tight tissue Neuromuscular re-education: Down training: Diaphragmatic breathing to lengthen the pelvic floor without contracting the abdominals at the  end of the breath                                                                                                                      DATE: 03/12/23  EVAL See below   PATIENT EDUCATION:  03/12/23 Education details: Access Code: [email or web address] Person educated: Patient Education method: Programmer, multimedia, Demonstration, Actor cues, Verbal cues, and Handouts Education comprehension: verbalized understanding, returned demonstration, verbal cues required, tactile cues required, and needs further education  HOME EXERCISE PROGRAM: 03/12/23 Access Code: [email or web address] URL: [email or web address] Date: 03/12/2023 Prepared by: Eulis Foster  Program Notes use the estrogen cream daily for 2 weeks then 2 times per week afterwards.   Exercises - Seated Pelvic Floor Contraction  - 3 x daily - 7 x weekly - 1 sets - 5 reps - 5 sec hold  ASSESSMENT:  CLINICAL IMPRESSION: Patient is a 62 y.o. female who was seen today for physical therapy treatment for Overactive bladder. . Patient will benefit from skilled therapy  to improve pelvic floor strength and reduce urgency.   OBJECTIVE IMPAIRMENTS: decreased coordination, decreased strength, and increased fascial restrictions.   ACTIVITY LIMITATIONS: continence and toileting  PARTICIPATION LIMITATIONS: shopping, community activity, and occupation  PERSONAL FACTORS: Time since onset of injury/illness/exacerbation are also affecting patient's functional outcome.   REHAB POTENTIAL: Excellent  CLINICAL DECISION MAKING: Stable/uncomplicated  EVALUATION COMPLEXITY: Low   GOALS: Goals reviewed with patient? Yes  SHORT TERM GOALS: Target date: 04/07/23  Patient independent with initial HEP for pelvic floor strengthening Baseline: Goal status: INITIAL  2.  Patient educated on vaginal moisturizers and vaginal health.  Baseline:  Goal status: INITIAL  3.  Patient able to perform diaphragmatic breathing to relax the pelvic floor.  Baseline:  Goal  status: INITIAL   LONG TERM GOALS: Target date: 09/06/2023  Patient independent with advanced HEP for pelvic floor, core and hip strength to improve urinary frequency.  Baseline:  Goal status: INITIAL  2.  Patient reports she is wakes up to urinate 3 or less times due to reduction of urgency.  Baseline:  Goal status: INITIAL  3.  Patient pelvic floor strength increased >/= 3/5 contracting for 10 seconds so she is able to walk to the bathroom without leaking urine.  Baseline:  Goal status: INITIAL  4.  Patient reports her urinary urgency decreased >/= 60% due to using the urge to void behavioral techniques.  Baseline:  Goal status: INITIAL  5.  Patient reports she is using 0-1 pad per day due to reduction of urinary leakage.  Baseline:  Goal status: INITIAL   PLAN:  PT FREQUENCY: 1x/week  PT DURATION: 6 months  PLANNED INTERVENTIONS: 97110-Therapeutic exercises, 97530- Therapeutic activity, 97112- Neuromuscular re-education, 97140- Manual therapy, Patient/Family education, Dry Needling, Cryotherapy, Moist heat, and Biofeedback  PLAN FOR NEXT SESSION: manual work to the pelvic floor to work on tissue recruitment, diaphragmatic breathing to expand the lower rib cage, Abdominal contraction, work on the back to bring the tissue forward, go over the urge to void   Eulis Foster, PT 03/19/23 11:25 AM

## 2023-03-26 ENCOUNTER — Encounter: Payer: 59 | Attending: Obstetrics and Gynecology | Admitting: Physical Therapy

## 2023-03-26 ENCOUNTER — Encounter: Payer: Self-pay | Admitting: Physical Therapy

## 2023-03-26 DIAGNOSIS — M6281 Muscle weakness (generalized): Secondary | ICD-10-CM | POA: Insufficient documentation

## 2023-03-26 DIAGNOSIS — R278 Other lack of coordination: Secondary | ICD-10-CM | POA: Insufficient documentation

## 2023-03-26 NOTE — Therapy (Signed)
OUTPATIENT PHYSICAL THERAPY FEMALE PELVIC TREATMENT  Patient Name: Angela Garner MRN: [id number] DOB:October 09, 1960, 62 y.o., female Today's Date: 03/26/2023  END OF SESSION:  PT End of Session - 03/26/23 1040     Visit Number 3    Date for PT Re-Evaluation 09/09/23    Authorization Type [employer]et[employer]    PT Start Time 1035    PT Stop Time 1120    PT Time Calculation (min) 45 min    Activity Tolerance Patient tolerated treatment well    Behavior During Therapy [facility] for tasks assessed/performed             Past Medical History:  Diagnosis Date   Abdominal pain 05/07/2017   Abdominal pain, epigastric 02/28/2007   Qualifier: Diagnosis of  By: Blossom Hoops MD, Luis     Acute left-sided low back pain with left-sided sciatica 01/14/2015   Acute left-sided low back pain without sciatica 05/09/2017   ALLERGIC RHINITIS 05/15/2006   Qualifier: Diagnosis of  By: Daine Gip     Allergy    Arm numbness 03/22/2020   BACK PAIN 10/31/2006   Qualifier: Diagnosis of  By: Yetta Barre CMA, Chemira     Bilateral lower extremity edema 10/19/2019   Bronchitis 09/14/2021   Chronic pain of right knee 11/11/2018   Chronic venous insufficiency 01/13/2019   Controlled type 2 diabetes mellitus with chronic kidney disease, without long-term current use of insulin (HCC) 09/20/2020   CTS (carpal tunnel syndrome) 03/17/2013   Diabetes mellitus without complication (HCC)    no meds, only check bs   Elevated CK 06/07/2021   Essential hypertension 11/05/2007   Qualifier: Diagnosis of  By: Janit Bern     Fatigue 09/20/2020   GAS/BLOATING 02/28/2007   Qualifier: Diagnosis of  By: Blossom Hoops MD, Luis     GASTROENTERITIS 02/28/2007   Qualifier: Diagnosis of  By: Blossom Hoops MD, Luis     Greater trochanteric bursitis of left hip 05/09/2017   Hyperglycemia 07/17/2012   Hyperlipidemia    Hyperlipidemia associated with type 2 diabetes mellitus (HCC) 10/19/2019   HYPERTENSION, BENIGN ESSENTIAL 10/31/2006   Qualifier: Diagnosis of  By: Yetta Barre  CMA, Chemira     KNEE PAIN 01/30/2007   Qualifier: Diagnosis of  By: Blossom Hoops MD, Luis     Lymphocytosis 11/06/2011   MURMUR 11/20/2007   Qualifier: Diagnosis of  By: Janit Bern     Neck pain 06/07/2021   Palpitations 09/20/2020   Primary hypertension 03/22/2020   SINUSITIS- ACUTE-NOS 04/02/2007   Qualifier: Diagnosis of  By: Blossom Hoops MD, Luis     Uncontrolled type 2 diabetes mellitus with hyperglycemia (HCC) 01/30/2019   Urinary frequency 07/17/2012   Urinary incontinence 11/11/2018   Varicose 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 11/11/2018   Vitamin D deficiency 03/22/2020   Past Surgical History:  Procedure Laterality Date   UTERINE FIBROID SURGERY     Patient Active Problem List   Diagnosis Date Noted   Dyspepsia 08/28/2022   RUQ pain 08/28/2022   Bulging lumbar disc 08/10/2022   Arthritis of both knees 08/10/2022   Sore throat 02/27/2022   Preventative health care 01/18/2022   Allergy 11/29/2021   Diabetes mellitus without complication (HCC) 11/29/2021   Bronchitis 09/14/2021   Elevated CK 06/07/2021   Neck pain 06/07/2021   Controlled type 2 diabetes mellitus with chronic kidney disease, without long-term current use of insulin (HCC) 09/20/2020   Fatigue 09/20/2020   Palpitations 09/20/2020   Arm numbness 03/22/2020   Primary hypertension 03/22/2020  Vitamin D deficiency 03/22/2020   Hyperlipidemia associated with type 2 diabetes mellitus (HCC) 10/19/2019   Bilateral lower extremity edema 10/19/2019   Uncontrolled type 2 diabetes mellitus with hyperglycemia (HCC) 01/30/2019   Hyperlipidemia    Chronic venous insufficiency 01/13/2019   Varicose 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 11/11/2018   Chronic pain of right knee 11/11/2018   Urinary incontinence 11/11/2018   Greater trochanteric bursitis of left hip 05/09/2017   Acute left-sided low back pain without sciatica 05/09/2017   Abdominal pain 05/07/2017   Acute left-sided low back pain with  left-sided sciatica 01/14/2015   CTS (carpal tunnel syndrome) 03/17/2013   Hyperglycemia 07/17/2012   Urinary frequency 07/17/2012   Lymphocytosis 11/06/2011   MURMUR 11/20/2007   Essential hypertension 11/05/2007   SINUSITIS- ACUTE-NOS 04/02/2007   GASTROENTERITIS 02/28/2007   GAS/BLOATING 02/28/2007   ABDOMINAL PAIN, EPIGASTRIC 02/28/2007   KNEE PAIN 01/30/2007   HYPERTENSION, BENIGN ESSENTIAL 10/31/2006   Backache 10/31/2006   Allergic rhinitis 05/15/2006    PCP: Donato Schultz DO  REFERRING PROVIDER: Marguerita Beards, MD   REFERRING DIAG: N32.81 (ICD-10-CM) - Overactive bladder   THERAPY DIAG:  Muscle weakness (generalized)  Other lack of coordination  Rationale for Evaluation and Treatment: Rehabilitation  ONSET DATE: 2019  SUBJECTIVE:                                                                                                                                                                                           SUBJECTIVE STATEMENT: I had to change my pad one time last night compared to 3 times. My scar is softer.  Fluid intake: Yes:    water, caffeine free green tea per day (cannot tolerate caffeine)   PAIN:  Are you having pain? No  PRECAUTIONS: None  RED FLAGS: None   WEIGHT BEARING RESTRICTIONS: No  FALLS:  Has patient fallen in last 6 months? No  LIVING ENVIRONMENT: Lives with: lives with their family  OCCUPATION: caregiver  PLOF: Independent  PATIENT GOALS: reduce the amount to urinate  PERTINENT HISTORY:  Diabetes; Hypertension; Uterine Fibroid surgery  BOWEL MOVEMENT: no issues  URINATION: Pain with urination: No Fully empty bladder: NoWhen urinating, she feels dribbling after finishing and the need to urinate multiple times in a row  Stream: Strong Urgency: Yes: has to get to the bathroom Frequency: Day time voids 5.  Nocturia: 6 times per night to void. Sometimes tries to get up to urinate at night but has leaked  all of the urine so stays in bed.  Leakage: Urge to void, Walking to the bathroom, and while asleep, sitting to  standing, with a full bladder  , leaks 4-5 times per day Pads: Yes: 4-5 pads per day  INTERCOURSE: not active  PREGNANCY: none    OBJECTIVE:  Note: Objective measures were completed at Evaluation unless otherwise noted.  DIAGNOSTIC FINDINGS:  Pelvic floor strength II/V  PVR of 0 ml was obtained by bladder scan.   COGNITION: Overall cognitive status: Within functional limits for tasks assessed     SENSATION: Light touch: Appears intact Proprioception: Appears intact   POSTURE: No Significant postural limitations  PELVIC ALIGNMENT: ASIS are equal  LUMBARAROM/PROM: lumbar ROM is full  LOWER EXTREMITY ROM: full bilateral hip ROM   LOWER EXTREMITY MMT:  MMT Right eval Left eval  Hip extension 4/5 4/5  Hip abduction 3/5 3/5   PALPATION:   General  Decreased opening of the lower rib cage                External Perineal Exam dryness                             Internal Pelvic Floor tightness along the sides of the introitus and along the urethra  Patient confirms identification and approves PT to assess internal pelvic floor and treatment Yes  PELVIC MMT:   MMT eval  Vaginal 2/5 for 1 sec  (Blank rows = not tested)        TONE: average  PROLAPSE: none  TODAY'S TREATMENT:          03/26/23 Manual: Soft tissue mobilization: Manual work to the lumbar paraspinals to lengthen the tissue and quadratus Pulling the tissue from the back to the abdomen in quadruped Manual work to the diaphragm in quadruped with movement Manual work to lengthen the lower abdomen in quadruped Neuromuscular re-education: Down training: Diaphragmatic breathing with expanding the lower rib cage into the pelvic floor with tactile cues to expand the posterior rib cage 15 X Exercises: Stretches/mobility: Double knee to chest holding 30 sec 1 times Pulling knee across  chest holding 30 sec bil.  Strengthening: Supine transverse abdominus with pelvic floor contraction 10 x  Supine hip flexion isometric with pelvic floor contraction 10 x each side Quadruped lift opposite extremity 10 x each     03/19/23 Manual: Soft tissue mobilization: Manual work to the diaphragm, along the sides of the rectus, and in the umbilicus to reduce the restrictions Scar tissue mobilization: Scar work to the lower abdomen going through the restrictions and using the suction cup to lift the scar Educated patient on how to perform the scar mobilization at home Myofascial release: Fascial release along the inguinal canal, mesenteric root, lower right abdominal and lateral abdominal going the the tight tissue Neuromuscular re-education: Down training: Diaphragmatic breathing to lengthen the pelvic floor without contracting the abdominals at the end of the breath  PATIENT EDUCATION:  03/26/23 Education details: Access Code: [email or web address] Person educated: Patient Education method: Explanation, Demonstration, Tactile cues, Verbal cues, and Handouts Education comprehension: verbalized understanding, returned demonstration, verbal cues required, tactile cues required, and needs further education  HOME EXERCISE PROGRAM: 03/26/23 Access Code: [email or web address] URL: [email or web address] Date: 03/26/2023 Prepared by: Eulis Foster  Program Notes use the estrogen cream daily for 2 weeks then 2 times per week afterwards.   Exercises - Seated Pelvic Floor Contraction  - 3 x daily - 7 x weekly - 1 sets - 5 reps - 5 sec hold - Supine Diaphragmatic Breathing  - 1 x daily - 7 x weekly - 3 sets - 10 reps - Supine Double Knee to Chest  - 1 x daily - 3 x weekly - 1 sets - 1 reps - 30 sec hold - Supine Piriformis Stretch with Leg Straight  - 1 x daily - 3 x weekly - 1 sets -  1 reps - 30 sec hold - Hooklying Transversus Abdominis Palpation  - 1 x daily - 3 x weekly - 1 sets - 10 reps - Hooklying Isometric Hip Flexion  - 1 x daily - 3 x weekly - 1 sets - 10 reps - Quadruped Pelvic Floor Contraction with Opposite Arm and Leg Lift  - 1 x daily - 3 x weekly - 1 sets - 10 reps  ASSESSMENT:  CLINICAL IMPRESSION: Patient is a 62 y.o. female who was seen today for physical therapy treatment for Overactive bladder. Patient is able to walk to the bathroom after the urge to urinate and has not had leakage in the past few days. Patient only changed her pad 1 times last night compared to 3 times. She is able to feel her pelvic floor relax with diaphragmatic breathing. She is able to engage her core with her exercises. Patient will benefit from skilled therapy to improve pelvic floor strength and reduce urgency.   OBJECTIVE IMPAIRMENTS: decreased coordination, decreased strength, and increased fascial restrictions.   ACTIVITY LIMITATIONS: continence and toileting  PARTICIPATION LIMITATIONS: shopping, community activity, and occupation  PERSONAL FACTORS: Time since onset of injury/illness/exacerbation are also affecting patient's functional outcome.   REHAB POTENTIAL: Excellent  CLINICAL DECISION MAKING: Stable/uncomplicated  EVALUATION COMPLEXITY: Low   GOALS: Goals reviewed with patient? Yes  SHORT TERM GOALS: Target date: 04/07/23  Patient independent with initial HEP for pelvic floor strengthening Baseline: Goal status: Met 03/26/23  2.  Patient educated on vaginal moisturizers and vaginal health.  Baseline:  Goal status: Met 03/26/23  3.  Patient able to perform diaphragmatic breathing to relax the pelvic floor.  Baseline:  Goal status: Met 03/26/23   LONG TERM GOALS: Target date: 09/06/2023  Patient independent with advanced HEP for pelvic floor, core and hip strength to improve urinary frequency.  Baseline:  Goal status: INITIAL  2.  Patient reports  she is wakes up to urinate 3 or less times due to reduction of urgency.  Baseline:  Goal status: INITIAL  3.  Patient pelvic floor strength increased >/= 3/5 contracting for 10 seconds so she is able to walk to the bathroom without leaking urine.  Baseline:  Goal status: INITIAL  4.  Patient reports her urinary urgency decreased >/= 60% due to using the urge to void behavioral techniques.  Baseline:  Goal status: INITIAL  5.  Patient reports she is using 0-1 pad per day due to reduction of urinary leakage.  Baseline:  Goal status: INITIAL   PLAN:  PT FREQUENCY: 1x/week  PT DURATION: 6 months  PLANNED INTERVENTIONS: 97110-Therapeutic exercises, 97530- Therapeutic activity, 97112- Neuromuscular re-education, 97140- Manual therapy, Patient/Family education, Dry Needling, Cryotherapy, Moist heat, and Biofeedback  PLAN FOR NEXT SESSION: manual work to the pelvic floor to work on tissue recruitment,  Abdominal contraction,  go over the urge to void   Eulis Foster, PT 03/26/23 11:25 AM

## 2023-04-02 ENCOUNTER — Encounter: Payer: 59 | Admitting: Physical Therapy

## 2023-04-04 ENCOUNTER — Encounter: Payer: Self-pay | Admitting: Physical Therapy

## 2023-04-04 DIAGNOSIS — R278 Other lack of coordination: Secondary | ICD-10-CM

## 2023-04-04 DIAGNOSIS — H524 Presbyopia: Secondary | ICD-10-CM | POA: Diagnosis not present

## 2023-04-04 DIAGNOSIS — M6281 Muscle weakness (generalized): Secondary | ICD-10-CM | POA: Diagnosis not present

## 2023-04-04 DIAGNOSIS — H52223 Regular astigmatism, bilateral: Secondary | ICD-10-CM | POA: Diagnosis not present

## 2023-04-04 DIAGNOSIS — H2513 Age-related nuclear cataract, bilateral: Secondary | ICD-10-CM | POA: Diagnosis not present

## 2023-04-04 DIAGNOSIS — H5203 Hypermetropia, bilateral: Secondary | ICD-10-CM | POA: Diagnosis not present

## 2023-04-04 DIAGNOSIS — E119 Type 2 diabetes mellitus without complications: Secondary | ICD-10-CM | POA: Diagnosis not present

## 2023-04-04 LAB — HM DIABETES EYE EXAM

## 2023-04-04 NOTE — Therapy (Addendum)
 OUTPATIENT PHYSICAL THERAPY FEMALE PELVIC TREATMENT  Patient Name: Angela Garner MRN: [id number] DOB:1961/04/17, 62 y.o., female Today's Date: 04/04/2023  END OF SESSION:  PT End of Session - 04/04/23 1136     Visit Number 4    Date for PT Re-Evaluation 09/09/23    Authorization Type [employer]    PT Start Time 1130    PT Stop Time 1215    PT Time Calculation (min) 45 min    Activity Tolerance Patient tolerated treatment well    Behavior During Therapy [facility] for tasks assessed/performed             Past Medical History:  Diagnosis Date   Abdominal pain 05/07/2017   Abdominal pain, epigastric 02/28/2007   Qualifier: Diagnosis of  By: Tita MD, Luis     Acute left-sided low back pain with left-sided sciatica 01/14/2015   Acute left-sided low back pain without sciatica 05/09/2017   ALLERGIC RHINITIS 05/15/2006   Qualifier: Diagnosis of  By: Nicholaus Channel     Allergy    Arm numbness 03/22/2020   BACK PAIN 10/31/2006   Qualifier: Diagnosis of  By: Joshua CMA, Chemira     Bilateral lower extremity edema 10/19/2019   Bronchitis 09/14/2021   Chronic pain of right knee 11/11/2018   Chronic venous insufficiency 01/13/2019   Controlled type 2 diabetes mellitus with chronic kidney disease, without long-term current use of insulin (HCC) 09/20/2020   CTS (carpal tunnel syndrome) 03/17/2013   Diabetes mellitus without complication (HCC)    no meds, only check bs   Elevated CK 06/07/2021   Essential hypertension 11/05/2007   Qualifier: Diagnosis of  By: Antonio ROSALEA Rockers     Fatigue 09/20/2020   GAS/BLOATING 02/28/2007   Qualifier: Diagnosis of  By: Tita MD, Luis     GASTROENTERITIS 02/28/2007   Qualifier: Diagnosis of  By: Tita MD, Luis     Greater trochanteric bursitis of left hip 05/09/2017   Hyperglycemia 07/17/2012   Hyperlipidemia    Hyperlipidemia associated with type 2 diabetes mellitus (HCC) 10/19/2019   HYPERTENSION, BENIGN ESSENTIAL 10/31/2006   Qualifier: Diagnosis of  By:  Joshua CMA, Chemira     KNEE PAIN 01/30/2007   Qualifier: Diagnosis of  By: Tita MD, Luis     Lymphocytosis 11/06/2011   MURMUR 11/20/2007   Qualifier: Diagnosis of  By: Antonio ROSALEA Rockers     Neck pain 06/07/2021   Palpitations 09/20/2020   Primary hypertension 03/22/2020   SINUSITIS- ACUTE-NOS 04/02/2007   Qualifier: Diagnosis of  By: Tita MD, Luis     Uncontrolled type 2 diabetes mellitus with hyperglycemia (HCC) 01/30/2019   Urinary frequency 07/17/2012   Urinary incontinence 11/11/2018   Varicose 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 11/11/2018   Vitamin D deficiency 03/22/2020   Past Surgical History:  Procedure Laterality Date   UTERINE FIBROID SURGERY     Patient Active Problem List   Diagnosis Date Noted   Dyspepsia 08/28/2022   RUQ pain 08/28/2022   Bulging lumbar disc 08/10/2022   Arthritis of both knees 08/10/2022   Sore throat 02/27/2022   Preventative health care 01/18/2022   Allergy 11/29/2021   Diabetes mellitus without complication (HCC) 11/29/2021   Bronchitis 09/14/2021   Elevated CK 06/07/2021   Neck pain 06/07/2021   Controlled type 2 diabetes mellitus with chronic kidney disease, without long-term current use of insulin (HCC) 09/20/2020   Fatigue 09/20/2020   Palpitations 09/20/2020   Arm numbness 03/22/2020   Primary hypertension 03/22/2020  Vitamin D deficiency 03/22/2020   Hyperlipidemia associated with type 2 diabetes mellitus (HCC) 10/19/2019   Bilateral lower extremity edema 10/19/2019   Uncontrolled type 2 diabetes mellitus with hyperglycemia (HCC) 01/30/2019   Hyperlipidemia    Chronic venous insufficiency 01/13/2019   Varicose 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 11/11/2018   Chronic pain of right knee 11/11/2018   Urinary incontinence 11/11/2018   Greater trochanteric bursitis of left hip 05/09/2017   Acute left-sided low back pain without sciatica 05/09/2017   Abdominal pain 05/07/2017   Acute left-sided low back pain with  left-sided sciatica 01/14/2015   CTS (carpal tunnel syndrome) 03/17/2013   Hyperglycemia 07/17/2012   Urinary frequency 07/17/2012   Lymphocytosis 11/06/2011   MURMUR 11/20/2007   Essential hypertension 11/05/2007   SINUSITIS- ACUTE-NOS 04/02/2007   GASTROENTERITIS 02/28/2007   GAS/BLOATING 02/28/2007   ABDOMINAL PAIN, EPIGASTRIC 02/28/2007   KNEE PAIN 01/30/2007   HYPERTENSION, BENIGN ESSENTIAL 10/31/2006   Backache 10/31/2006   Allergic rhinitis 05/15/2006    PCP: Antonio Cyndee Jamee FABIENE DO  REFERRING PROVIDER: Marilynne Rosaline SAILOR, MD   REFERRING DIAG: N32.81 (ICD-10-CM) - Overactive bladder   THERAPY DIAG:  Muscle weakness (generalized)  Other lack of coordination  Rationale for Evaluation and Treatment: Rehabilitation  ONSET DATE: 2019  SUBJECTIVE:                                                                                                                                                                                           SUBJECTIVE STATEMENT: Most trouble at night.  Fluid intake: Yes:   water, caffeine free green tea per day (cannot tolerate caffeine)   PAIN:  Are you having pain? No  PRECAUTIONS: None  RED FLAGS: None   WEIGHT BEARING RESTRICTIONS: No  FALLS:  Has patient fallen in last 6 months? No  LIVING ENVIRONMENT: Lives with: lives with their family  OCCUPATION: caregiver  PLOF: Independent  PATIENT GOALS: reduce the amount to urinate  PERTINENT HISTORY:  Diabetes; Hypertension; Uterine Fibroid surgery  BOWEL MOVEMENT: no issues  URINATION: Pain with urination: No Fully empty bladder: NoWhen urinating, she feels dribbling after finishing and the need to urinate multiple times in a row  Stream: Strong Urgency: Yes: has to get to the bathroom Frequency: Day time voids 5.  Nocturia: 6 times per night to void. Sometimes tries to get up to urinate at night but has leaked all of the urine so stays in bed.  Leakage: Urge to void,  Walking to the bathroom, and while asleep, sitting to standing, with a full bladder , leaks 4-5 times per day Pads: Yes: 4-5 pads  per day  INTERCOURSE: not active  PREGNANCY: none    OBJECTIVE:  Note: Objective measures were completed at Evaluation unless otherwise noted.  DIAGNOSTIC FINDINGS:  Pelvic floor strength II/V  PVR of 0 ml was obtained by bladder scan.   COGNITION: Overall cognitive status: Within functional limits for tasks assessed     SENSATION: Light touch: Appears intact Proprioception: Appears intact   POSTURE: No Significant postural limitations  PELVIC ALIGNMENT: ASIS are equal  LUMBARAROM/PROM: lumbar ROM is full  LOWER EXTREMITY ROM: full bilateral hip ROM   LOWER EXTREMITY MMT:  MMT Right eval Left eval  Hip extension 4/5 4/5  Hip abduction 3/5 3/5   PALPATION:   General  Decreased opening of the lower rib cage                External Perineal Exam dryness                             Internal Pelvic Floor tightness along the sides of the introitus and along the urethra  Patient confirms identification and approves PT to assess internal pelvic floor and treatment Yes  PELVIC MMT:   MMT eval 04/04/23  Vaginal 2/5 for 1 sec 3/5 holding 6 sec  (Blank rows = not tested)        TONE: average  PROLAPSE: none  TODAY'S TREATMENT:          04/04/23 Manual: Internal pelvic floor techniques: No emotional/communication barriers or cognitive limitation. Patient is motivated to learn. Patient understands and agrees with treatment goals and plan. PT explains patient will be examined in standing, sitting, and lying down to see how their muscles and joints work. When they are ready, they will be asked to remove their underwear so PT can examine their perineum. The patient is also given the option of providing their own chaperone as one is not provided in our facility. The patient also has the right and is explained the right to defer or refuse  any part of the evaluation or treatment including the internal exam. With the patient's consent, PT will use one gloved finger to gently assess the muscles of the pelvic floor, seeing how well it contracts and relaxes and if there is muscle symmetry. After, the patient will get dressed and PT and patient will discuss exam findings and plan of care. PT and patient discuss plan of care, schedule, attendance policy and HEP activities.  Manual work to the perineal body going through the restrictions and working to elongate the tissue Going through the vaginal canal working on the sides of the introitus, along the posterior canal, going slowly to elongate the tissue and improve mobility Exercises: Stretches/mobility: Educated patient on how to perform her own manual work to the perineum and introitus     03/26/23 Manual: Soft tissue mobilization: Manual work to the lumbar paraspinals to lengthen the tissue and quadratus Pulling the tissue from the back to the abdomen in quadruped Manual work to the diaphragm in quadruped with movement Manual work to lengthen the lower abdomen in quadruped Neuromuscular re-education: Down training: Diaphragmatic breathing with expanding the lower rib cage into the pelvic floor with tactile cues to expand the posterior rib cage 15 X Exercises: Stretches/mobility: Double knee to chest holding 30 sec 1 times Pulling knee across chest holding 30 sec bil.  Strengthening: Supine transverse abdominus with pelvic floor contraction 10 x  Supine hip flexion isometric with pelvic floor  contraction 10 x each side Quadruped lift opposite extremity 10 x each     03/19/23 Manual: Soft tissue mobilization: Manual work to the diaphragm, along the sides of the rectus, and in the umbilicus to reduce the restrictions Scar tissue mobilization: Scar work to the lower abdomen going through the restrictions and using the suction cup to lift the scar Educated patient on how to  perform the scar mobilization at home Myofascial release: Fascial release along the inguinal canal, mesenteric root, lower right abdominal and lateral abdominal going the the tight tissue Neuromuscular re-education: Down training: Diaphragmatic breathing to lengthen the pelvic floor without contracting the abdominals at the end of the breath                                                                                                                       PATIENT EDUCATION:  03/26/23 Education details: Access Code: [email or web address], educated  Person educated: Patient Education method: Programmer, Multimedia, Facilities Manager, Actor cues, Verbal cues, and Handouts Education comprehension: verbalized understanding, returned demonstration, verbal cues required, tactile cues required, and needs further education  HOME EXERCISE PROGRAM: 03/26/23 Access Code: [email or web address] URL: [email or web address] Date: 03/26/2023 Prepared by: Channing Pereyra  Program Notes use the estrogen cream daily for 2 weeks then 2 times per week afterwards.   Exercises - Seated Pelvic Floor Contraction  - 3 x daily - 7 x weekly - 1 sets - 5 reps - 5 sec hold - Supine Diaphragmatic Breathing  - 1 x daily - 7 x weekly - 3 sets - 10 reps - Supine Double Knee to Chest  - 1 x daily - 3 x weekly - 1 sets - 1 reps - 30 sec hold - Supine Piriformis Stretch with Leg Straight  - 1 x daily - 3 x weekly - 1 sets - 1 reps - 30 sec hold - Hooklying Transversus Abdominis Palpation  - 1 x daily - 3 x weekly - 1 sets - 10 reps - Hooklying Isometric Hip Flexion  - 1 x daily - 3 x weekly - 1 sets - 10 reps - Quadruped Pelvic Floor Contraction with Opposite Arm and Leg Lift  - 1 x daily - 3 x weekly - 1 sets - 10 reps  ASSESSMENT:  CLINICAL IMPRESSION: Patient is a 62 y.o. female who was seen today for physical therapy treatment for Overactive bladder. Patient pelvic floor strength increased to 3/5 holding for 6 seconds after manual work. She  has restricted perineal body and along the introitus. Patient continues to get up several times due to having to urinate. Patient will benefit from skilled therapy to improve pelvic floor strength and reduce urgency.   OBJECTIVE IMPAIRMENTS: decreased coordination, decreased strength, and increased fascial restrictions.   ACTIVITY LIMITATIONS: continence and toileting  PARTICIPATION LIMITATIONS: shopping, community activity, and occupation  PERSONAL FACTORS: Time since onset of injury/illness/exacerbation are also affecting patient's functional outcome.   REHAB POTENTIAL: Excellent  CLINICAL DECISION MAKING: Stable/uncomplicated  EVALUATION COMPLEXITY: Low   GOALS:  Goals reviewed with patient? Yes  SHORT TERM GOALS: Target date: 04/07/23  Patient independent with initial HEP for pelvic floor strengthening Baseline: Goal status: Met 03/26/23  2.  Patient educated on vaginal moisturizers and vaginal health.  Baseline:  Goal status: Met 03/26/23  3.  Patient able to perform diaphragmatic breathing to relax the pelvic floor.  Baseline:  Goal status: Met 03/26/23   LONG TERM GOALS: Target date: 09/06/2023  Patient independent with advanced HEP for pelvic floor, core and hip strength to improve urinary frequency.  Baseline:  Goal status: INITIAL  2.  Patient reports she is wakes up to urinate 3 or less times due to reduction of urgency.  Baseline:  Goal status: INITIAL  3.  Patient pelvic floor strength increased >/= 3/5 contracting for 10 seconds so she is able to walk to the bathroom without leaking urine.  Baseline:  Goal status: INITIAL  4.  Patient reports her urinary urgency decreased >/= 60% due to using the urge to void behavioral techniques.  Baseline:  Goal status: INITIAL  5.  Patient reports she is using 0-1 pad per day due to reduction of urinary leakage.  Baseline:  Goal status: INITIAL   PLAN:  PT FREQUENCY: 1x/week  PT DURATION: 6 months  PLANNED  INTERVENTIONS: 97110-Therapeutic exercises, 97530- Therapeutic activity, 97112- Neuromuscular re-education, 97140- Manual therapy, Patient/Family education, Dry Needling, Cryotherapy, Moist heat, and Biofeedback  PLAN FOR NEXT SESSION: manual work to the pelvic floor to work on tissue recruitment,  Abdominal contraction,  go over the urge to void   Channing Pereyra, PT 04/04/23 12:45 PM   PHYSICAL THERAPY DISCHARGE SUMMARY  Visits from Start of Care: 4  Current functional level related to goals / functional outcomes: See above. Patient did not return since last visit on 04/04/23.   Remaining deficits: See above.    Education / Equipment: HEP   Patient agrees to discharge. Patient goals were not met. Patient is being discharged due to not returning since the last visit. Thank you  for the referral.   Channing Pereyra, PT 03/26/24 4:33 PM

## 2023-04-05 ENCOUNTER — Encounter: Payer: Self-pay | Admitting: *Deleted

## 2023-04-08 DIAGNOSIS — Z1331 Encounter for screening for depression: Secondary | ICD-10-CM | POA: Diagnosis not present

## 2023-04-08 DIAGNOSIS — Z01419 Encounter for gynecological examination (general) (routine) without abnormal findings: Secondary | ICD-10-CM | POA: Diagnosis not present

## 2023-04-08 DIAGNOSIS — Z1231 Encounter for screening mammogram for malignant neoplasm of breast: Secondary | ICD-10-CM | POA: Diagnosis not present

## 2023-04-08 LAB — RESULTS CONSOLE HPV: CHL HPV: NEGATIVE

## 2023-04-08 LAB — HM MAMMOGRAPHY

## 2023-04-11 LAB — HM PAP SMEAR

## 2023-04-18 ENCOUNTER — Other Ambulatory Visit: Payer: Self-pay | Admitting: Family Medicine

## 2023-04-18 DIAGNOSIS — E1165 Type 2 diabetes mellitus with hyperglycemia: Secondary | ICD-10-CM

## 2023-04-22 ENCOUNTER — Ambulatory Visit: Payer: 59 | Admitting: Physical Therapy

## 2023-05-04 ENCOUNTER — Other Ambulatory Visit: Payer: Self-pay | Admitting: Family Medicine

## 2023-05-04 DIAGNOSIS — T7840XA Allergy, unspecified, initial encounter: Secondary | ICD-10-CM

## 2023-05-13 ENCOUNTER — Other Ambulatory Visit: Payer: Self-pay | Admitting: Family Medicine

## 2023-05-13 ENCOUNTER — Encounter: Payer: Self-pay | Admitting: Internal Medicine

## 2023-05-13 DIAGNOSIS — E1165 Type 2 diabetes mellitus with hyperglycemia: Secondary | ICD-10-CM

## 2023-05-27 ENCOUNTER — Encounter: Payer: Self-pay | Admitting: Family Medicine

## 2023-05-27 ENCOUNTER — Ambulatory Visit (INDEPENDENT_AMBULATORY_CARE_PROVIDER_SITE_OTHER): Payer: 59 | Admitting: Family Medicine

## 2023-05-27 VITALS — BP 138/86 | HR 67 | Ht 67.32 in | Wt 202.4 lb

## 2023-05-27 DIAGNOSIS — Z7985 Long-term (current) use of injectable non-insulin antidiabetic drugs: Secondary | ICD-10-CM

## 2023-05-27 DIAGNOSIS — E1165 Type 2 diabetes mellitus with hyperglycemia: Secondary | ICD-10-CM | POA: Diagnosis not present

## 2023-05-27 DIAGNOSIS — Z1211 Encounter for screening for malignant neoplasm of colon: Secondary | ICD-10-CM

## 2023-05-27 DIAGNOSIS — E1169 Type 2 diabetes mellitus with other specified complication: Secondary | ICD-10-CM

## 2023-05-27 DIAGNOSIS — I10 Essential (primary) hypertension: Secondary | ICD-10-CM | POA: Diagnosis not present

## 2023-05-27 DIAGNOSIS — Z23 Encounter for immunization: Secondary | ICD-10-CM

## 2023-05-27 DIAGNOSIS — E785 Hyperlipidemia, unspecified: Secondary | ICD-10-CM

## 2023-05-27 DIAGNOSIS — E1122 Type 2 diabetes mellitus with diabetic chronic kidney disease: Secondary | ICD-10-CM | POA: Diagnosis not present

## 2023-05-27 LAB — CBC WITH DIFFERENTIAL/PLATELET
Basophils Absolute: 0 10*3/uL (ref 0.0–0.1)
Basophils Relative: 1 % (ref 0.0–3.0)
Eosinophils Absolute: 0.1 10*3/uL (ref 0.0–0.7)
Eosinophils Relative: 1.7 % (ref 0.0–5.0)
HCT: 46.4 % — ABNORMAL HIGH (ref 36.0–46.0)
Hemoglobin: 15.2 g/dL — ABNORMAL HIGH (ref 12.0–15.0)
Lymphocytes Relative: 40.2 % (ref 12.0–46.0)
Lymphs Abs: 1.9 10*3/uL (ref 0.7–4.0)
MCHC: 32.8 g/dL (ref 30.0–36.0)
MCV: 94.7 fL (ref 78.0–100.0)
Monocytes Absolute: 0.5 10*3/uL (ref 0.1–1.0)
Monocytes Relative: 9.5 % (ref 3.0–12.0)
Neutro Abs: 2.3 10*3/uL (ref 1.4–7.7)
Neutrophils Relative %: 47.6 % (ref 43.0–77.0)
Platelets: 180 10*3/uL (ref 150.0–400.0)
RBC: 4.9 Mil/uL (ref 3.87–5.11)
RDW: 14.5 % (ref 11.5–15.5)
WBC: 4.8 10*3/uL (ref 4.0–10.5)

## 2023-05-27 LAB — COMPREHENSIVE METABOLIC PANEL
ALT: 21 U/L (ref 0–35)
AST: 24 U/L (ref 0–37)
Albumin: 4.3 g/dL (ref 3.5–5.2)
Alkaline Phosphatase: 84 U/L (ref 39–117)
BUN: 13 mg/dL (ref 6–23)
CO2: 31 meq/L (ref 19–32)
Calcium: 9.3 mg/dL (ref 8.4–10.5)
Chloride: 104 meq/L (ref 96–112)
Creatinine, Ser: 0.8 mg/dL (ref 0.40–1.20)
GFR: 78.28 mL/min (ref >60.00)
Glucose, Bld: 99 mg/dL (ref 70–99)
Potassium: 3.7 meq/L (ref 3.5–5.1)
Sodium: 143 meq/L (ref 135–145)
Total Bilirubin: 0.6 mg/dL (ref 0.2–1.2)
Total Protein: 7 g/dL (ref 6.0–8.3)

## 2023-05-27 LAB — LIPID PANEL
Cholesterol: 195 mg/dL (ref 0–200)
HDL: 85.9 mg/dL (ref >39.00)
LDL Cholesterol: 100 mg/dL — ABNORMAL HIGH (ref 0–99)
NonHDL: 109.28
Total CHOL/HDL Ratio: 2
Triglycerides: 46 mg/dL (ref 0.0–149.0)
VLDL: 9.2 mg/dL (ref 0.0–40.0)

## 2023-05-27 LAB — HEMOGLOBIN A1C: Hgb A1c MFr Bld: 6.3 % (ref 4.6–6.5)

## 2023-05-27 LAB — TSH: TSH: 2.81 u[IU]/mL (ref 0.35–5.50)

## 2023-05-27 MED ORDER — TRULICITY 0.75 MG/0.5ML ~~LOC~~ SOAJ: 0.7500 mg | SUBCUTANEOUS | 2 refills | Status: DC

## 2023-05-27 NOTE — Assessment & Plan Note (Signed)
 Encourage heart healthy diet such as MIND or DASH diet, increase exercise, avoid trans fats, simple carbohydrates and processed foods, consider a krill or fish or flaxseed oil cap daily.

## 2023-05-27 NOTE — Assessment & Plan Note (Signed)
 Well controlled, no changes to meds. Encouraged heart healthy diet such as the DASH diet and exercise as tolerated.

## 2023-05-27 NOTE — Assessment & Plan Note (Signed)
 [id number] to be checked, minimize simple carbs. Increase exercise as tolerated. Continue current meds

## 2023-05-27 NOTE — Progress Notes (Signed)
Established Patient Office Visit  Subjective   Patient ID: Angela Garner, female    DOB: [birth year]  Age: 63 y.o. MRN: [id number]  Chief Complaint  Patient presents with   Medication Refill    HPI Discussed the use of AI scribe software for clinical note transcription with the patient, who gave verbal consent to proceed.  History of Present Illness   The patient presents for a follow-up visit to manage diabetes and discuss vaccinations.  She has been experiencing blood sugar levels around 120 mg/dL when not adhering to her dietary regimen. She is concerned about recent weight gain, which she attributes to the holidays. She is currently using Trulicity but has not been consistent with refills.  Her vaccination history includes not having received the shingles vaccine. She received the pneumonia vaccine in 2020. She did not receive a flu shot last year but is considering it this year. She is also due for a tetanus vaccine.  She mentions a recent change in her work schedule, now working nights, which is not her usual routine. 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  Her brother now lives with her, and she discusses her mother's living situation, noting that she moved to the area during the COVID pandemic and is active in her community, attending church and the gym.  No issues with her feet, stating she can feel them and has no sores. She mentions feeling 'funny' with the recent cold weather but does not specify any particular symptoms.      Patient Active Problem List   Diagnosis Date Noted   Dyspepsia 08/28/2022   RUQ pain 08/28/2022   Bulging lumbar disc 08/10/2022   Arthritis of both knees 08/10/2022   Sore throat 02/27/2022   Preventative health care 01/18/2022   Allergy 11/29/2021   Diabetes mellitus without complication (HCC) 11/29/2021   Bronchitis 09/14/2021   Elevated CK  06/07/2021   Neck pain 06/07/2021   Controlled type 2 diabetes mellitus with chronic kidney disease, without long-term current use of insulin (HCC) 09/20/2020   Fatigue 09/20/2020   Palpitations 09/20/2020   Arm numbness 03/22/2020   Primary hypertension 03/22/2020   Vitamin D deficiency 03/22/2020   Hyperlipidemia associated with type 2 diabetes mellitus (HCC) 10/19/2019   Bilateral lower extremity edema 10/19/2019   Uncontrolled type 2 diabetes mellitus with hyperglycemia (HCC) 01/30/2019   Hyperlipidemia    Chronic venous insufficiency 01/13/2019   Varicose 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 11/11/2018   Chronic pain of right knee 11/11/2018   Urinary incontinence 11/11/2018   Greater trochanteric bursitis of left hip 05/09/2017   Acute left-sided low back pain without sciatica 05/09/2017   Abdominal pain 05/07/2017   Acute left-sided low back pain with left-sided sciatica 01/14/2015   CTS (carpal tunnel syndrome) 03/17/2013   Hyperglycemia 07/17/2012   Urinary frequency 07/17/2012   Lymphocytosis 11/06/2011   MURMUR 11/20/2007   Essential hypertension 11/05/2007   SINUSITIS- ACUTE-NOS 04/02/2007   GASTROENTERITIS 02/28/2007   GAS/BLOATING 02/28/2007   ABDOMINAL PAIN, EPIGASTRIC 02/28/2007   KNEE PAIN 01/30/2007   HYPERTENSION, BENIGN ESSENTIAL 10/31/2006   Backache 10/31/2006   Allergic rhinitis 05/15/2006   Past Medical History:  Diagnosis Date   Abdominal pain 05/07/2017   Abdominal pain, epigastric 02/28/2007   Qualifier: Diagnosis of  By: Blossom Hoops MD, Luis     Acute left-sided low back pain with left-sided sciatica 01/14/2015  Acute left-sided low back pain without sciatica 05/09/2017   ALLERGIC RHINITIS 05/15/2006   Qualifier: Diagnosis of  By: Daine Gip     Allergy    Arm numbness 03/22/2020   BACK PAIN 10/31/2006   Qualifier: Diagnosis of  By: Yetta Barre CMA, Chemira     Bilateral lower extremity edema 10/19/2019   Bronchitis 09/14/2021   Chronic pain of  right knee 11/11/2018   Chronic venous insufficiency 01/13/2019   Controlled type 2 diabetes mellitus with chronic kidney disease, without long-term current use of insulin (HCC) 09/20/2020   CTS (carpal tunnel syndrome) 03/17/2013   Diabetes mellitus without complication (HCC)    no meds, only check bs   Elevated CK 06/07/2021   Essential hypertension 11/05/2007   Qualifier: Diagnosis of  By: Janit Bern     Fatigue 09/20/2020   GAS/BLOATING 02/28/2007   Qualifier: Diagnosis of  By: Blossom Hoops MD, Luis     GASTROENTERITIS 02/28/2007   Qualifier: Diagnosis of  By: Blossom Hoops MD, Luis     Greater trochanteric bursitis of left hip 05/09/2017   Hyperglycemia 07/17/2012   Hyperlipidemia    Hyperlipidemia associated with type 2 diabetes mellitus (HCC) 10/19/2019   HYPERTENSION, BENIGN ESSENTIAL 10/31/2006   Qualifier: Diagnosis of  By: Yetta Barre CMA, Chemira     KNEE PAIN 01/30/2007   Qualifier: Diagnosis of  By: Blossom Hoops MD, Luis     Lymphocytosis 11/06/2011   MURMUR 11/20/2007   Qualifier: Diagnosis of  By: Janit Bern     Neck pain 06/07/2021   Palpitations 09/20/2020   Primary hypertension 03/22/2020   SINUSITIS- ACUTE-NOS 04/02/2007   Qualifier: Diagnosis of  By: Blossom Hoops MD, Luis     Uncontrolled type 2 diabetes mellitus with hyperglycemia (HCC) 01/30/2019   Urinary frequency 07/17/2012   Urinary incontinence 11/11/2018   Varicose 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 11/11/2018   Vitamin D deficiency 03/22/2020   Past Surgical History:  Procedure Laterality Date   UTERINE FIBROID SURGERY     Social History   Tobacco Use   Smoking status: Never   Smokeless tobacco: Never  Vaping Use   Vaping status: Never Used  Substance Use Topics   Alcohol use: No   Drug use: No   Social History   Socioeconomic History   Marital status: Divorced    Spouse name: Not on file   Number of children: Not on file   Years of education: Not on file   Highest education level: Not on file   Occupational History   Occupation: [employer]     Comment: [employer]  Tobacco Use   Smoking status: Never   Smokeless tobacco: Never  Vaping Use   Vaping status: Never Used  Substance and Sexual Activity   Alcohol use: No   Drug use: No   Sexual activity: Yes  Other Topics Concern   Not on file  Social History Narrative   Not on file   Social Drivers of Health   Financial Resource Strain: Not on file  Food Insecurity: Not on file  Transportation Needs: Not on file  Physical Activity: Not on file  Stress: Not on file  Social Connections: Not on file  Intimate Partner Violence: Not on file   Family Status  Relation Name Status   Mother  Deceased   Father  Deceased at age 59       MI   Sister  Alive   Sister  Deceased at age 40   Brother  Alive   Brother  Alive   Brother  Alive   Brother  Alive   Neg Hx  (Not Specified)  No partnership data on file   Family History  Problem Relation Age of Onset   Dementia Mother    Hypertension Father    Diabetes Father    Heart attack Father    Diabetes Sister        she had stomach pain-- cause unknown   Clotting disorder Brother    Colon cancer Neg Hx    Esophageal cancer Neg Hx    Stomach cancer Neg Hx    Rectal cancer Neg Hx    Allergies  Allergen Reactions   Metformin And Related    Pravachol [Pravastatin Sodium] Other (See Comments)    Muscle aches   Simvastatin     "leg pains"      Review of Systems  Constitutional:  Negative for chills, fever and malaise/fatigue.  HENT:  Negative for congestion and hearing loss.   Eyes:  Negative for blurred vision and discharge.  Respiratory:  Negative for cough, sputum production and shortness of breath.   Cardiovascular:  Negative for chest pain, palpitations and leg swelling.  Gastrointestinal:  Negative for abdominal pain, blood in stool, constipation, diarrhea, heartburn, nausea and vomiting.  Genitourinary:  Negative for dysuria, frequency, hematuria  and urgency.  Musculoskeletal:  Negative for back pain, falls and myalgias.  Skin:  Negative for rash.  Neurological:  Negative for dizziness, sensory change, loss of consciousness, weakness and headaches.  Endo/Heme/Allergies:  Negative for environmental allergies. Does not bruise/bleed easily.  Psychiatric/Behavioral:  Negative for depression and suicidal ideas. The patient is not nervous/anxious and does not have insomnia.       Objective:     BP 138/86 (BP Location: Left Arm, Patient Position: Sitting, Cuff Size: Normal)   Pulse 67   Ht 5' 7.32" (1.71 m)   Wt 202 lb 6.4 oz (91.8 kg)   SpO2 97%   BMI 31.40 kg/m  BP Readings from Last 3 Encounters:  05/27/23 138/86  12/05/22 132/84  11/19/22 110/80   Wt Readings from Last 3 Encounters:  05/27/23 202 lb 6.4 oz (91.8 kg)  12/05/22 194 lb (88 kg)  11/19/22 192 lb 3.2 oz (87.2 kg)   SpO2 Readings from Last 3 Encounters:  05/27/23 97%  11/19/22 99%  08/28/22 97%    Physical Exam Vitals and nursing note reviewed.  Constitutional:      General: She is not in acute distress.    Appearance: Normal appearance. She is well-developed.  HENT:     Head: Normocephalic and atraumatic.  Eyes:     General: No scleral icterus.       Right eye: No discharge.        Left eye: No discharge.  Cardiovascular:     Rate and Rhythm: Normal rate and regular rhythm.     Heart sounds: No murmur heard. Pulmonary:     Effort: Pulmonary effort is normal. No respiratory distress.     Breath sounds: Normal breath sounds.  Musculoskeletal:        General: Normal range of motion.     Cervical back: Normal range of motion and neck supple.     Right lower leg: No edema.     Left lower leg: No edema.  Skin:    General: Skin is warm and dry.  Neurological:     Mental Status: She is alert and oriented to person, place, and time.  Psychiatric:        Mood and Affect: Mood normal.        Behavior: Behavior normal.        Thought Content:  Thought content normal.        Judgment: Judgment normal.      No results found for any visits on 05/27/23.  Last CBC Lab Results  Component Value Date   WBC 4.0 11/19/2022   HGB 14.8 11/19/2022   HCT 45.0 11/19/2022   MCV 93.9 11/19/2022   MCH 32.3 01/30/2019   RDW 13.9 11/19/2022   PLT 182.0 11/19/2022   Last metabolic panel Lab Results  Component Value Date   GLUCOSE 109 (H) 11/19/2022   NA 139 11/19/2022   K 3.6 11/19/2022   CL 102 11/19/2022   CO2 30 11/19/2022   BUN 14 11/19/2022   CREATININE 0.96 11/19/2022   GFR 63.13 11/19/2022   CALCIUM 9.3 11/19/2022   PROT 6.9 11/19/2022   ALBUMIN 4.3 11/19/2022   BILITOT 0.7 11/19/2022   ALKPHOS 74 11/19/2022   AST 24 11/19/2022   ALT 19 11/19/2022   Last lipids Lab Results  Component Value Date   CHOL 188 11/19/2022   HDL 80.30 11/19/2022   LDLCALC 95 11/19/2022   LDLDIRECT 120.9 08/02/2011   TRIG 63.0 11/19/2022   CHOLHDL 2 11/19/2022   Last hemoglobin A1c Lab Results  Component Value Date   HGBA1C 6.0 11/19/2022   Last thyroid functions Lab Results  Component Value Date   TSH 2.98 09/20/2020   T4TOTAL 7.5 01/30/2019   Last vitamin D Lab Results  Component Value Date   VD25OH 41.20 09/20/2020   Last vitamin B12 and Folate Lab Results  Component Value Date   VITAMINB12 637 09/20/2020   FOLATE >24.0 01/30/2019      The 10-year ASCVD risk score (Arnett DK, et al., 2019) is: 18.4%    Assessment & Plan:   Problem List Items Addressed This Visit       Unprioritized   Uncontrolled type 2 diabetes mellitus with hyperglycemia (HCC) - Primary   Relevant Medications   Dulaglutide (TRULICITY) 0.75 MG/0.5ML SOAJ   Other Relevant Orders   Lipid panel   TSH   Hemoglobin A1c   Primary hypertension   Relevant Orders   CBC with Differential/Platelet   Comprehensive metabolic panel   Hyperlipidemia associated with type 2 diabetes mellitus (HCC)   Encourage heart healthy diet such as MIND or DASH  diet, increase exercise, avoid trans fats, simple carbohydrates and processed foods, consider a krill or fish or flaxseed oil cap daily.        Relevant Medications   Dulaglutide (TRULICITY) 0.75 MG/0.5ML SOAJ   Other Relevant Orders   CBC with Differential/Platelet   Comprehensive metabolic panel   Lipid panel   Essential hypertension   Well controlled, no changes to meds. Encouraged heart healthy diet such as the DASH diet and exercise as tolerated.        Controlled type 2 diabetes mellitus with chronic kidney disease, without long-term current use of insulin (HCC)   hgba1c to be checked , minimize simple carbs. Increase exercise as tolerated. Continue current meds       Relevant Medications   Dulaglutide (TRULICITY) 0.75 MG/0.5ML SOAJ   Other Visit Diagnoses       Need for vaccination       Relevant Orders   Pneumococcal conjugate vaccine 20-valent (Prevnar 20) (Completed)   Flu vaccine trivalent PF, 6mos and older(Flulaval,Afluria,Fluarix,Fluzone) (Completed)  Colon cancer screening       Relevant Orders   Ambulatory referral to Gastroenterology     Assessment and Plan    Type 2 Diabetes Mellitus   Blood glucose levels are around 120 mg/dL when not adhering to diet, with no extreme fluctuations. Currently on Trulicity, which requires a refill. Weight gain is likely due to the holiday season. Emphasized the importance of regular monitoring and weight management. Refill Trulicity, encourage regular blood glucose monitoring, and discuss weight management strategies.  Vaccination Status   Due for shingles, pneumonia, tetanus, and flu vaccines. Discussed the importance and potential side effects, advising to space out vaccinations to avoid adverse reactions. The shingles vaccine requires two doses 2-6 months apart, pneumonia vaccine every five years, tetanus every ten years, and flu shot annually. Recommended scheduling vaccinations on days off to monitor for adverse  reactions. Administer flu and pneumonia shots today, schedule the shingles vaccine on a day off, and schedule the tetanus vaccine separately.  General Health Maintenance   A colonoscopy is due, and the importance of scheduling soon due to long wait times was discussed. Prefers to schedule after the first quarter. Have the office call to schedule a colonoscopy with Dr. Zoila Shutter.  Follow-up   Schedule the next appointment in six months.        No follow-ups on file.    Donato Schultz, DO

## 2023-05-29 ENCOUNTER — Encounter: Payer: Self-pay | Admitting: Nurse Practitioner

## 2023-06-07 ENCOUNTER — Other Ambulatory Visit: Payer: Self-pay | Admitting: Family Medicine

## 2023-06-07 DIAGNOSIS — T7840XA Allergy, unspecified, initial encounter: Secondary | ICD-10-CM

## 2023-06-11 DIAGNOSIS — E669 Obesity, unspecified: Secondary | ICD-10-CM | POA: Diagnosis not present

## 2023-06-11 DIAGNOSIS — E785 Hyperlipidemia, unspecified: Secondary | ICD-10-CM | POA: Diagnosis not present

## 2023-06-11 DIAGNOSIS — Z5948 Other specified lack of adequate food: Secondary | ICD-10-CM | POA: Diagnosis not present

## 2023-06-11 DIAGNOSIS — I10 Essential (primary) hypertension: Secondary | ICD-10-CM | POA: Diagnosis not present

## 2023-06-11 DIAGNOSIS — R32 Unspecified urinary incontinence: Secondary | ICD-10-CM | POA: Diagnosis not present

## 2023-06-11 DIAGNOSIS — M5430 Sciatica, unspecified side: Secondary | ICD-10-CM | POA: Diagnosis not present

## 2023-06-11 DIAGNOSIS — Z7985 Long-term (current) use of injectable non-insulin antidiabetic drugs: Secondary | ICD-10-CM | POA: Diagnosis not present

## 2023-06-11 DIAGNOSIS — Z8249 Family history of ischemic heart disease and other diseases of the circulatory system: Secondary | ICD-10-CM | POA: Diagnosis not present

## 2023-06-11 DIAGNOSIS — Z5941 Food insecurity: Secondary | ICD-10-CM | POA: Diagnosis not present

## 2023-06-11 DIAGNOSIS — Z833 Family history of diabetes mellitus: Secondary | ICD-10-CM | POA: Diagnosis not present

## 2023-06-11 DIAGNOSIS — Z6830 Body mass index (BMI) 30.0-30.9, adult: Secondary | ICD-10-CM | POA: Diagnosis not present

## 2023-06-11 DIAGNOSIS — E119 Type 2 diabetes mellitus without complications: Secondary | ICD-10-CM | POA: Diagnosis not present

## 2023-07-19 ENCOUNTER — Encounter: Payer: Self-pay | Admitting: Internal Medicine

## 2023-07-22 ENCOUNTER — Encounter: Payer: Self-pay | Admitting: Internal Medicine

## 2023-07-22 ENCOUNTER — Ambulatory Visit (INDEPENDENT_AMBULATORY_CARE_PROVIDER_SITE_OTHER): Admitting: Internal Medicine

## 2023-07-22 VITALS — BP 132/88 | HR 68 | Temp 98.1°F | Resp 12 | Ht 67.0 in | Wt 203.4 lb

## 2023-07-22 DIAGNOSIS — N390 Urinary tract infection, site not specified: Secondary | ICD-10-CM | POA: Diagnosis not present

## 2023-07-22 DIAGNOSIS — R300 Dysuria: Secondary | ICD-10-CM

## 2023-07-22 LAB — POCT URINALYSIS DIP (MANUAL ENTRY)
Bilirubin, UA: NEGATIVE
Blood, UA: NEGATIVE
Glucose, UA: NEGATIVE mg/dL
Ketones, POC UA: NEGATIVE mg/dL
Leukocytes, UA: NEGATIVE
Nitrite, UA: NEGATIVE
Protein Ur, POC: NEGATIVE mg/dL
Spec Grav, UA: 1.01 (ref 1.010–1.025)
Urobilinogen, UA: 0.2 U/dL
pH, UA: 6 (ref 5.0–8.0)

## 2023-07-22 MED ORDER — NITROFURANTOIN MONOHYD MACRO 100 MG PO CAPS: 100.0000 mg | ORAL_CAPSULE | Freq: Two times a day (BID) | ORAL | 0 refills | Status: DC

## 2023-07-22 NOTE — Progress Notes (Signed)
 Subjective:    Patient ID: Angela Garner, female    DOB: 1960/08/28, 63 y.o.   MRN: [id number]  DOS:  07/22/2023 Type of visit - description: Acute visit  Symptoms started 4 days ago: Some mid lower back pain, mild dysuria. She felt that she probably had a UTI, got OTC test and it came back positive.  Denies fever or chills. No nausea vomiting. No gross hematuria. Denies vaginal bleeding or vaginal discharge. No flank pain.  No abdominal pain.  Review of Systems See above   Past Medical History:  Diagnosis Date   Abdominal pain 05/07/2017   Abdominal pain, epigastric 02/28/2007   Qualifier: Diagnosis of  By: Blossom Hoops MD, Luis     Acute left-sided low back pain with left-sided sciatica 01/14/2015   Acute left-sided low back pain without sciatica 05/09/2017   ALLERGIC RHINITIS 05/15/2006   Qualifier: Diagnosis of  By: Daine Gip     Allergy    Arm numbness 03/22/2020   BACK PAIN 10/31/2006   Qualifier: Diagnosis of  By: Yetta Barre CMA, Chemira     Bilateral lower extremity edema 10/19/2019   Bronchitis 09/14/2021   Chronic pain of right knee 11/11/2018   Chronic venous insufficiency 01/13/2019   Controlled type 2 diabetes mellitus with chronic kidney disease, without long-term current use of insulin (HCC) 09/20/2020   CTS (carpal tunnel syndrome) 03/17/2013   Diabetes mellitus without complication (HCC)    no meds, only check bs   Elevated CK 06/07/2021   Essential hypertension 11/05/2007   Qualifier: Diagnosis of  By: Janit Bern     Fatigue 09/20/2020   GAS/BLOATING 02/28/2007   Qualifier: Diagnosis of  By: Blossom Hoops MD, Luis     GASTROENTERITIS 02/28/2007   Qualifier: Diagnosis of  By: Blossom Hoops MD, Luis     Greater trochanteric bursitis of left hip 05/09/2017   Hyperglycemia 07/17/2012   Hyperlipidemia    Hyperlipidemia associated with type 2 diabetes mellitus (HCC) 10/19/2019   HYPERTENSION, BENIGN ESSENTIAL 10/31/2006   Qualifier: Diagnosis of  By: Yetta Barre CMA, Chemira      KNEE PAIN 01/30/2007   Qualifier: Diagnosis of  By: Blossom Hoops MD, Luis     Lymphocytosis 11/06/2011   MURMUR 11/20/2007   Qualifier: Diagnosis of  By: Janit Bern     Neck pain 06/07/2021   Palpitations 09/20/2020   Primary hypertension 03/22/2020   SINUSITIS- ACUTE-NOS 04/02/2007   Qualifier: Diagnosis of  By: Blossom Hoops MD, Luis     Uncontrolled type 2 diabetes mellitus with hyperglycemia (HCC) 01/30/2019   Urinary frequency 07/17/2012   Urinary incontinence 11/11/2018   Varicose 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 11/11/2018   Vitamin D deficiency 03/22/2020    Past Surgical History:  Procedure Laterality Date   UTERINE FIBROID SURGERY      Current Outpatient Medications  Medication Instructions   amLODipine (NORVASC) 2.5 mg, Oral, Daily   blood glucose meter kit and supplies KIT Check glucose daily   famotidine (PEPCID) 20 mg, Oral, 2 times daily   fluticasone (FLONASE) 50 MCG/ACT nasal spray 2 sprays, Each Nare, Daily   levocetirizine (XYZAL) 5 mg, Oral, Every evening   multivitamin-iron-minerals-folic acid (CENTRUM) chewable tablet 1 tablet, Daily   NONFORMULARY OR COMPOUNDED ITEM Compression stocking -- thigh high  20-30 mm/hg #1  Dx varicose veint   olmesartan (BENICAR) 40 mg, Oral, Daily   rosuvastatin (CRESTOR) 5 MG tablet Take 1 tablet by mouth once daily   Trulicity 0.75 mg, Subcutaneous, Weekly, SCHEDULE  APPT FOR FURTHER REFILLS       Objective:   Physical Exam BP 132/88 (BP Location: Right Arm, Patient Position: Sitting, Cuff Size: Large)   Pulse 68   Temp 98.1 F (36.7 C) (Oral)   Resp 12   Ht 5\' 7"  (1.702 m)   Wt 203 lb 6.4 oz (92.3 kg)   SpO2 98%   BMI 31.86 kg/m  General:   Well developed, NAD, BMI noted.  HEENT:  Normocephalic . Face symmetric, atraumatic  Abdomen:  Not distended, soft, non-tender. No rebound or rigidity.  No CVA tenderness Skin: Not pale. Not jaundice Lower extremities: no pretibial edema bilaterally  Neurologic:  alert &  oriented X3.  Speech normal, gait appropriate for age and unassisted Psych--  Cognition and judgment appear intact.  Cooperative with normal attention span and concentration.  Behavior appropriate. No anxious or depressed appearing.     Assessment   63 year old female, PMH includes HTN, dyspepsia, hypertension, high cholesterol, hyperglycemia  UTI: Udip neg today but had home test that came back + and symptoms consistent with cystitis. Plan: UA urine culture. Empiric Macrobid

## 2023-07-22 NOTE — Patient Instructions (Signed)
 Drink plenty of fluids  Take the antibiotic as prescribed  Call if not gradually better

## 2023-07-23 LAB — URINALYSIS, ROUTINE W REFLEX MICROSCOPIC
Bilirubin Urine: NEGATIVE
Hgb urine dipstick: NEGATIVE
Ketones, ur: NEGATIVE
Leukocytes,Ua: NEGATIVE
Nitrite: NEGATIVE
RBC / HPF: NONE SEEN (ref 0–?)
Specific Gravity, Urine: 1.015 (ref 1.000–1.030)
Total Protein, Urine: NEGATIVE
Urine Glucose: NEGATIVE
Urobilinogen, UA: 0.2 (ref 0.0–1.0)
WBC, UA: NONE SEEN (ref 0–?)
pH: 6 (ref 5.0–8.0)

## 2023-07-23 LAB — URINE CULTURE
MICRO NUMBER:: 16268158
Result:: NO GROWTH
SPECIMEN QUALITY:: ADEQUATE

## 2023-07-24 ENCOUNTER — Encounter: Payer: Self-pay | Admitting: Internal Medicine

## 2023-08-13 ENCOUNTER — Other Ambulatory Visit: Payer: Self-pay | Admitting: Family Medicine

## 2023-08-13 DIAGNOSIS — I10 Essential (primary) hypertension: Secondary | ICD-10-CM

## 2023-08-18 ENCOUNTER — Other Ambulatory Visit: Payer: Self-pay | Admitting: Family Medicine

## 2023-08-18 DIAGNOSIS — E1165 Type 2 diabetes mellitus with hyperglycemia: Secondary | ICD-10-CM

## 2023-09-02 ENCOUNTER — Encounter (HOSPITAL_COMMUNITY): Payer: Self-pay

## 2023-09-09 ENCOUNTER — Telehealth: Payer: Self-pay | Admitting: *Deleted

## 2023-09-09 ENCOUNTER — Encounter

## 2023-09-09 ENCOUNTER — Encounter: Payer: Self-pay | Admitting: Internal Medicine

## 2023-09-09 NOTE — Telephone Encounter (Signed)
 Attempt to reach pt for pre-visit. LM with call back #.  Will attempt to reach again in 5 min due to no other # listed in profile  Second attempt to reach pt for pre-vist unsuccessful. LM with facility # for pt to call back. Instructed pt to call # given by end of the day and reschedule the pre-visit  with RN or the scheduled procedure will be canceled.

## 2023-09-19 DIAGNOSIS — R351 Nocturia: Secondary | ICD-10-CM | POA: Diagnosis not present

## 2023-09-19 DIAGNOSIS — I129 Hypertensive chronic kidney disease with stage 1 through stage 4 chronic kidney disease, or unspecified chronic kidney disease: Secondary | ICD-10-CM | POA: Diagnosis not present

## 2023-09-19 DIAGNOSIS — R82998 Other abnormal findings in urine: Secondary | ICD-10-CM | POA: Diagnosis not present

## 2023-09-19 LAB — LAB REPORT - SCANNED
Creatinine, POC: 111.8 mg/dL
EGFR: 79

## 2023-09-23 ENCOUNTER — Encounter: Admitting: Internal Medicine

## 2023-10-07 ENCOUNTER — Ambulatory Visit (INDEPENDENT_AMBULATORY_CARE_PROVIDER_SITE_OTHER): Admitting: Physician Assistant

## 2023-10-07 ENCOUNTER — Encounter: Payer: Self-pay | Admitting: Physician Assistant

## 2023-10-07 ENCOUNTER — Ambulatory Visit: Payer: Self-pay

## 2023-10-07 ENCOUNTER — Ambulatory Visit (AMBULATORY_SURGERY_CENTER)

## 2023-10-07 VITALS — BP 131/77 | HR 74 | Temp 97.8°F | Ht 67.0 in | Wt 199.4 lb

## 2023-10-07 VITALS — Ht 67.0 in | Wt 200.0 lb

## 2023-10-07 DIAGNOSIS — J020 Streptococcal pharyngitis: Secondary | ICD-10-CM | POA: Diagnosis not present

## 2023-10-07 DIAGNOSIS — J029 Acute pharyngitis, unspecified: Secondary | ICD-10-CM

## 2023-10-07 DIAGNOSIS — Z1211 Encounter for screening for malignant neoplasm of colon: Secondary | ICD-10-CM

## 2023-10-07 LAB — POCT RAPID STREP A (OFFICE): Rapid Strep A Screen: POSITIVE — AB

## 2023-10-07 LAB — POC COVID19 BINAXNOW: SARS Coronavirus 2 Ag: NEGATIVE

## 2023-10-07 MED ORDER — AMOXICILLIN 500 MG PO CAPS: 500.0000 mg | ORAL_CAPSULE | Freq: Two times a day (BID) | ORAL | 0 refills | Status: AC

## 2023-10-07 MED ORDER — NA SULFATE-K SULFATE-MG SULF 17.5-3.13-1.6 GM/177ML PO SOLN: 1.0000 | Freq: Once | ORAL | 0 refills | Status: AC

## 2023-10-07 NOTE — Telephone Encounter (Signed)
  FYI Only or Action Required?: FYI only for provider  Patient was last seen in primary care on 07/22/2023 by Ezell Hollow, MD. Called Nurse Triage reporting Nasal Congestion. Symptoms began several days ago. Interventions attempted: Nothing. Symptoms are: unchanged.  Triage Disposition: See PCP When Office is Open (Within 3 Days)  Patient/caregiver understands and will follow disposition?: Yes  Copied from CRM [id number]. Topic: Clinical - Red Word Triage >> Oct 07, 2023 10:17 AM Pam Bode wrote: Red Word that prompted transfer to Nurse Triage: Patient is having respiratory going on no shortness of breath or chest pain, She is congested. Reason for Disposition  [1] Sinus congestion (pressure, fullness) AND [2] present > 10 days  Answer Assessment - Initial Assessment Questions 1. LOCATION: Where does it hurt?      chest 2. ONSET: When did the sinus pain start?  (e.g., hours, days)      Four days ago 3. SEVERITY: How bad is the pain?   (Scale 1-10; mild, moderate or severe)   - MILD (1-3): doesn't interfere with normal activities    - MODERATE (4-7): interferes with normal activities (e.g., work or school) or awakens from sleep   - SEVERE (8-10): excruciating pain and patient unable to do any normal activities        moderate 4. RECURRENT SYMPTOM: Have you ever had sinus problems before? If Yes, ask: When was the last time? and What happened that time?      Yes, every time she has a cold 5. NASAL CONGESTION: Is the nose blocked? If Yes, ask: Can you open it or must you breathe through your mouth?     yes 6. NASAL DISCHARGE: Do you have discharge from your nose? If so ask, What color?     Deep yellow 7. FEVER: Do you have a fever? If Yes, ask: What is it, how was it measured, and when did it start?      no 8. OTHER SYMPTOMS: Do you have any other symptoms? (e.g., sore throat, cough, earache, difficulty breathing)     Sore throat, 9. PREGNANCY: Is there any  chance you are pregnant? When was your last menstrual period?     na  Protocols used: Sinus Pain or Congestion-A-AH

## 2023-10-07 NOTE — Progress Notes (Signed)

## 2023-10-07 NOTE — Progress Notes (Signed)
 Established patient visit   Patient: Angela Garner   DOB: 08/15/1960   63 y.o. Female  MRN: [id number] Visit Date: 10/07/2023  Today's healthcare provider: Trenton Frock, PA-C  Cc. Sore throat, URi symptoms  Subjective     Pt reports cough, sore throat, nasal congestion, headache.  Denies sob, chest pain. Some pain w/ cough. She has taken tylenol  otc. Reports her nephew and brother were sick recently.   Medications: Outpatient Medications Prior to Visit  Medication Sig   amLODipine  (NORVASC ) 2.5 MG tablet Take 1 tablet by mouth once daily   blood glucose meter kit and supplies KIT Check glucose daily   Dulaglutide  (TRULICITY ) 0.75 MG/0.5ML SOAJ Inject 0.75 mg into the skin once a week.   estradiol (ESTRACE) 0.1 MG/GM vaginal cream Place 1 g vaginally 2 (two) times a week.   famotidine  (PEPCID ) 20 MG tablet Take 1 tablet (20 mg total) by mouth 2 (two) times daily. (Patient taking differently: Take 20 mg by mouth as needed for heartburn or indigestion.)   fluticasone  (FLONASE ) 50 MCG/ACT nasal spray Place 2 sprays into both nostrils daily. (Patient taking differently: Place 2 sprays into both nostrils as needed for allergies or rhinitis.)   levocetirizine (XYZAL ) 5 MG tablet TAKE 1 TABLET BY MOUTH ONCE DAILY IN THE EVENING (Patient taking differently: Take 5 mg by mouth daily as needed.)   multivitamin-iron-minerals-folic acid  (CENTRUM) chewable tablet Chew 1 tablet by mouth daily.   Na Sulfate-K Sulfate-Mg Sulfate concentrate (SUPREP) 17.5-3.13-1.6 GM/177ML SOLN Take 1 kit (354 mLs total) by mouth once for 1 dose.   nitrofurantoin , macrocrystal-monohydrate, (MACROBID ) 100 MG capsule Take 1 capsule (100 mg total) by mouth 2 (two) times daily. (Patient not taking: Reported on 10/07/2023)   NONFORMULARY OR COMPOUNDED ITEM Compression stocking -- thigh high  20-30 mm/hg #1  Dx varicose veint   olmesartan  (BENICAR ) 40 MG tablet Take 1 tablet by mouth once daily   rosuvastatin  (CRESTOR ) 5  MG tablet Take 1 tablet by mouth once daily   No facility-administered medications prior to visit.    Review of Systems  Constitutional:  Positive for fatigue. Negative for fever.  HENT:  Positive for congestion, sinus pressure and sore throat.   Respiratory:  Positive for cough. Negative for shortness of breath.   Cardiovascular:  Negative for chest pain and leg swelling.  Gastrointestinal:  Negative for abdominal pain.  Neurological:  Positive for headaches. Negative for dizziness.       Objective    BP 131/77   Pulse 74   Temp 97.8 F (36.6 C)   Ht 5' 7 (1.702 m)   Wt 199 lb 6.4 oz (90.4 kg)   SpO2 97%   BMI 31.23 kg/m    Physical Exam Constitutional:      General: She is awake.     Appearance: She is well-developed.  HENT:     Head: Normocephalic.     Right Ear: There is impacted cerumen.     Left Ear: There is impacted cerumen.     Mouth/Throat:     Pharynx: Posterior oropharyngeal erythema present. No oropharyngeal exudate.   Eyes:     Conjunctiva/sclera: Conjunctivae normal.    Cardiovascular:     Rate and Rhythm: Normal rate and regular rhythm.     Heart sounds: Normal heart sounds.  Pulmonary:     Effort: Pulmonary effort is normal.     Breath sounds: Normal breath sounds. No wheezing, rhonchi or rales.   Skin:  General: Skin is warm.   Neurological:     Mental Status: She is alert and oriented to person, place, and time.   Psychiatric:        Attention and Perception: Attention normal.        Mood and Affect: Mood normal.        Speech: Speech normal.        Behavior: Behavior is cooperative.     No results found for any visits on 10/07/23.  Assessment & Plan    Strep pharyngitis -     Amoxicillin ; Take 1 capsule (500 mg total) by mouth 2 (two) times daily for 10 days.  Dispense: 20 capsule; Refill: 0 -     Culture, Group A Strep  Sore throat -     POC COVID-19 BinaxNow -     POCT rapid strep A   Poc strep positive. Given  atypical strep symptoms, re-swabbed and will send out for culture. Rx amox 500 bid x 10 days. Recommend antihistamines, fluids  Return if symptoms worsen or fail to improve.       Trenton Frock, PA-C  [facility] at [facility] [phone] (phone) [phone] (fax)  [facility]

## 2023-10-08 ENCOUNTER — Telehealth: Payer: Self-pay

## 2023-10-08 ENCOUNTER — Encounter: Payer: Self-pay | Admitting: Internal Medicine

## 2023-10-08 ENCOUNTER — Other Ambulatory Visit: Payer: Self-pay | Admitting: Physician Assistant

## 2023-10-08 MED ORDER — BENZONATATE 100 MG PO CAPS: 100.0000 mg | ORAL_CAPSULE | Freq: Two times a day (BID) | ORAL | 0 refills | Status: DC | PRN

## 2023-10-08 NOTE — Telephone Encounter (Signed)
 Copied from CRM [id number]. Topic: Clinical - Medication Question >> Oct 08, 2023  8:50 AM [place] M wrote: Reason for CRM: Patient wanting to know if a medication for cough medication can be called in to help with her symptoms more

## 2023-10-08 NOTE — Telephone Encounter (Signed)
LMOM notifying pt of rx. 

## 2023-10-08 NOTE — Telephone Encounter (Signed)
 Pt seen by Lillia Abed yesterday

## 2023-10-09 ENCOUNTER — Other Ambulatory Visit: Payer: Self-pay | Admitting: Physician Assistant

## 2023-10-09 ENCOUNTER — Telehealth: Payer: Self-pay

## 2023-10-09 ENCOUNTER — Ambulatory Visit: Payer: Self-pay | Admitting: Physician Assistant

## 2023-10-09 DIAGNOSIS — R062 Wheezing: Secondary | ICD-10-CM

## 2023-10-09 LAB — CULTURE, GROUP A STREP
Micro Number: 16584727
SPECIMEN QUALITY:: ADEQUATE

## 2023-10-09 MED ORDER — ALBUTEROL SULFATE HFA 108 (90 BASE) MCG/ACT IN AERS: 2.0000 | INHALATION_SPRAY | Freq: Four times a day (QID) | RESPIRATORY_TRACT | 0 refills | Status: DC | PRN

## 2023-10-09 NOTE — Telephone Encounter (Signed)
Needs PA please.

## 2023-10-09 NOTE — Telephone Encounter (Signed)
 Copied from CRM [id number]. Topic: Clinical - Medication Question >> Oct 09, 2023  3:54 PM Allyne Areola wrote: Reason for CRM: Angela Garner from [facility] is calling to advise that patient's albuterol  (VENTOLIN  HFA) 108 (90 Base) MCG/ACT inhaler [[id number]] is not covered through the patient's insurance. They would like to know if another alternative medication can be ordered.

## 2023-10-10 ENCOUNTER — Other Ambulatory Visit (HOSPITAL_COMMUNITY): Payer: Self-pay

## 2023-10-10 ENCOUNTER — Telehealth: Payer: Self-pay

## 2023-10-10 MED ORDER — PROVENTIL HFA 108 (90 BASE) MCG/ACT IN AERS: 2.0000 | INHALATION_SPRAY | Freq: Four times a day (QID) | RESPIRATORY_TRACT | 0 refills | Status: DC | PRN

## 2023-10-10 NOTE — Telephone Encounter (Signed)
 Pharmacy Patient Advocate Encounter   Received notification from Pt Calls Messages that prior authorization for Ventolin  HFA inhaler is required/requested.   Insurance verification completed.   The patient is insured through [employer] .   Per test claim: Ventolin  HFA - product/service not covered.   Placed a call to [facility] and the [facility] said the reject said albuterol  (Proventil ) is the preferred med and she tried to switch and the copay was $0.   Please send in a new order for Proventil  (albuterol ) to be covered. No PA is needed.

## 2023-10-10 NOTE — Telephone Encounter (Signed)
 Rx already sent.

## 2023-10-11 ENCOUNTER — Telehealth: Payer: Self-pay | Admitting: Family Medicine

## 2023-10-11 NOTE — Telephone Encounter (Unsigned)
 Copied from CRM [id number]. Topic: Clinical - Prescription Issue >> Oct 11, 2023  4:28 PM Chuck Crater wrote: Reason for CRM: Lane Pinon stated that Insurance will only pay for the generic brand PROVENTIL  HFA 108 (90 Base) MCG/ACT inhaler. She is needing another prescription sent for the generic.

## 2023-10-14 ENCOUNTER — Telehealth: Payer: Self-pay

## 2023-10-14 MED ORDER — ALBUTEROL SULFATE HFA 108 (90 BASE) MCG/ACT IN AERS: 2.0000 | INHALATION_SPRAY | Freq: Four times a day (QID) | RESPIRATORY_TRACT | 0 refills | Status: DC | PRN

## 2023-10-14 NOTE — Telephone Encounter (Signed)
 Copied from CRM [id number]. Topic: Clinical - Prescription Issue >> Oct 14, 2023 10:59 AM [place] J wrote: Reason for CRM: Niels calling from [facility] asking if PROVENTIL  HFA 108 (90 Base) MCG/ACT inhaler could be changed from DW1 to 0 so she can give the patient the generic version of PROVENTIL  HFA 108 (90 Base) MCG/ACT inhaler since insurance will only cover the generic version of the medication.

## 2023-10-14 NOTE — Telephone Encounter (Signed)
 Rx sent.

## 2023-10-15 MED ORDER — ALBUTEROL SULFATE HFA 108 (90 BASE) MCG/ACT IN AERS: 2.0000 | INHALATION_SPRAY | Freq: Four times a day (QID) | RESPIRATORY_TRACT | 3 refills | Status: AC | PRN

## 2023-10-15 NOTE — Telephone Encounter (Signed)
 Rx sent.

## 2023-10-21 NOTE — Progress Notes (Unsigned)
 [facility] History and Physical   Primary Care Physician:  Antonio Meth, Jamee SAUNDERS, DO   Reason for Procedure:  Colon cancer screening  Plan:    Colonoscopy     HPI: ANAELLE DUNTON is a 63 y.o. female here for a repeat screening colonoscopy.  2015 exam was normal.   Past Medical History:  Diagnosis Date   Abdominal pain 05/07/2017   Abdominal pain, epigastric 02/28/2007   Qualifier: Diagnosis of  By: Tita MD, Luis     Acute left-sided low back pain with left-sided sciatica 01/14/2015   Acute left-sided low back pain without sciatica 05/09/2017   ALLERGIC RHINITIS 05/15/2006   Qualifier: Diagnosis of  By: Nicholaus Channel     Allergy    Arm numbness 03/22/2020   BACK PAIN 10/31/2006   Qualifier: Diagnosis of  By: Joshua CMA, Chemira     Bilateral lower extremity edema 10/19/2019   Bronchitis 09/14/2021   Chronic pain of right knee 11/11/2018   Chronic venous insufficiency 01/13/2019   Controlled type 2 diabetes mellitus with chronic kidney disease, without long-term current use of insulin (HCC) 09/20/2020   CTS (carpal tunnel syndrome) 03/17/2013   Diabetes mellitus without complication (HCC)    no meds, only check bs   Elevated CK 06/07/2021   Essential hypertension 11/05/2007   Qualifier: Diagnosis of  By: Antonio ROSALEA Jamee     Fatigue 09/20/2020   GAS/BLOATING 02/28/2007   Qualifier: Diagnosis of  By: Tita MD, Luis     GASTROENTERITIS 02/28/2007   Qualifier: Diagnosis of  By: Tita MD, Luis     Greater trochanteric bursitis of left hip 05/09/2017   Hyperglycemia 07/17/2012   Hyperlipidemia    Hyperlipidemia associated with type 2 diabetes mellitus (HCC) 10/19/2019   HYPERTENSION, BENIGN ESSENTIAL 10/31/2006   Qualifier: Diagnosis of  By: Joshua CMA, Chemira     KNEE PAIN 01/30/2007   Qualifier: Diagnosis of  By: Tita MD, Luis     Lymphocytosis 11/06/2011   MURMUR 11/20/2007   Qualifier: Diagnosis of  By: Antonio ROSALEA Jamee     Neck pain 06/07/2021    Palpitations 09/20/2020   Primary hypertension 03/22/2020   SINUSITIS- ACUTE-NOS 04/02/2007   Qualifier: Diagnosis of  By: Tita MD, Luis     Uncontrolled type 2 diabetes mellitus with hyperglycemia (HCC) 01/30/2019   Urinary frequency 07/17/2012   Urinary incontinence 11/11/2018   Varicose 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 11/11/2018   Vitamin D deficiency 03/22/2020    Past Surgical History:  Procedure Laterality Date   UTERINE FIBROID SURGERY      Prior to Admission medications   Medication Sig Start Date End Date Taking? Authorizing Provider  albuterol  (PROVENTIL  HFA) 108 (90 Base) MCG/ACT inhaler Inhale 2 puffs into the lungs every 6 (six) hours as needed for wheezing or shortness of breath. 10/15/23   Antonio Meth, Jamee SAUNDERS, DO  benzonatate  (TESSALON ) 100 MG capsule Take 1 capsule (100 mg total) by mouth 2 (two) times daily as needed for cough. 10/08/23   Cyndi Shaver, PA-C  amLODipine  (NORVASC ) 2.5 MG tablet Take 1 tablet by mouth once daily 08/13/23   Antonio Meth, Yvonne R, DO  blood glucose meter kit and supplies KIT Check glucose daily 01/18/22   Antonio Meth, Yvonne R, DO  Dulaglutide  (TRULICITY ) 0.75 MG/0.5ML SOAJ Inject 0.75 mg into the skin once a week. 08/19/23   Antonio Meth Jamee SAUNDERS, DO  estradiol (ESTRACE) 0.1 MG/GM vaginal cream Place 1 g vaginally 2 (two)  times a week. 08/03/23   [provider]  famotidine  (PEPCID ) 20 MG tablet Take 1 tablet (20 mg total) by mouth 2 (two) times daily. Patient taking differently: Take 20 mg by mouth as needed for heartburn or indigestion. 10/03/21   Antonio Cyndee Jamee JONELLE, DO  fluticasone  (FLONASE ) 50 MCG/ACT nasal spray Place 2 sprays into both nostrils daily. Patient taking differently: Place 2 sprays into both nostrils as needed for allergies or rhinitis. 08/22/21   Lowne Chase, Yvonne R, DO  levocetirizine (XYZAL ) 5 MG tablet TAKE 1 TABLET BY MOUTH ONCE DAILY IN THE EVENING Patient taking differently: Take 5 mg by mouth  daily as needed. 06/10/23   Antonio Cyndee Jamee JONELLE, DO  multivitamin-iron-minerals-folic acid  (CENTRUM) chewable tablet Chew 1 tablet by mouth daily.    [provider]  nitrofurantoin , macrocrystal-monohydrate, (MACROBID ) 100 MG capsule Take 1 capsule (100 mg total) by mouth 2 (two) times daily. Patient not taking: Reported on 10/07/2023 07/22/23   Amon Aloysius BRAVO, MD  NONFORMULARY OR COMPOUNDED ITEM Compression stocking -- thigh high  20-30 mm/hg #1  Dx varicose veint 11/19/17   Antonio Cyndee, Jamee JONELLE, DO  olmesartan  (BENICAR ) 40 MG tablet Take 1 tablet by mouth once daily 08/13/23   Antonio Cyndee, Yvonne R, DO  rosuvastatin  (CRESTOR ) 5 MG tablet Take 1 tablet by mouth once daily 02/11/23   Lowne Chase, Yvonne R, DO    Current Outpatient Medications  Medication Sig Dispense Refill   albuterol  (PROVENTIL  HFA) 108 (90 Base) MCG/ACT inhaler Inhale 2 puffs into the lungs every 6 (six) hours as needed for wheezing or shortness of breath. 18 g 3   benzonatate  (TESSALON ) 100 MG capsule Take 1 capsule (100 mg total) by mouth 2 (two) times daily as needed for cough. 20 capsule 0   amLODipine  (NORVASC ) 2.5 MG tablet Take 1 tablet by mouth once daily 90 tablet 1   blood glucose meter kit and supplies KIT Check glucose daily 1 each 0   Dulaglutide  (TRULICITY ) 0.75 MG/0.5ML SOAJ Inject 0.75 mg into the skin once a week. 4 mL 2   estradiol (ESTRACE) 0.1 MG/GM vaginal cream Place 1 g vaginally 2 (two) times a week.     famotidine  (PEPCID ) 20 MG tablet Take 1 tablet (20 mg total) by mouth 2 (two) times daily. (Patient taking differently: Take 20 mg by mouth as needed for heartburn or indigestion.) 60 tablet 5   fluticasone  (FLONASE ) 50 MCG/ACT nasal spray Place 2 sprays into both nostrils daily. (Patient taking differently: Place 2 sprays into both nostrils as needed for allergies or rhinitis.) 16 g 6   levocetirizine (XYZAL ) 5 MG tablet TAKE 1 TABLET BY MOUTH ONCE DAILY IN THE EVENING (Patient taking differently:  Take 5 mg by mouth daily as needed.) 30 tablet 2   multivitamin-iron-minerals-folic acid  (CENTRUM) chewable tablet Chew 1 tablet by mouth daily.     nitrofurantoin , macrocrystal-monohydrate, (MACROBID ) 100 MG capsule Take 1 capsule (100 mg total) by mouth 2 (two) times daily. (Patient not taking: Reported on 10/07/2023) 6 capsule 0   NONFORMULARY OR COMPOUNDED ITEM Compression stocking -- thigh high  20-30 mm/hg #1  Dx varicose veint 1 each 0   olmesartan  (BENICAR ) 40 MG tablet Take 1 tablet by mouth once daily 90 tablet 1   rosuvastatin  (CRESTOR ) 5 MG tablet Take 1 tablet by mouth once daily 90 tablet 1   No current facility-administered medications for this visit.    Allergies as of 10/22/2023 - Review Complete 10/07/2023  Allergen Reaction Noted   Metformin  and related  08/06/2018   Pravachol  [pravastatin  sodium] Other (See Comments) 09/21/2014   Simvastatin   12/16/2017    Family History  Problem Relation Age of Onset   Dementia Mother    Hypertension Father    Diabetes Father    Heart attack Father    Diabetes Sister        she had stomach pain-- cause unknown   Clotting disorder Brother    Colon cancer Neg Hx    Esophageal cancer Neg Hx    Stomach cancer Neg Hx    Rectal cancer Neg Hx     Social History   Socioeconomic History   Marital status: Divorced    Spouse name: Not on file   Number of children: Not on file   Years of education: Not on file   Highest education level: Not on file  Occupational History   Occupation: [employer]     Comment: [employer]  Tobacco Use   Smoking status: Never   Smokeless tobacco: Never  Vaping Use   Vaping status: Never Used  Substance and Sexual Activity   Alcohol use: No   Drug use: No   Sexual activity: Yes  Other Topics Concern   Not on file  Social History Narrative   Not on file   Social Drivers of Health   Financial Resource Strain: Not on file  Food Insecurity: Not on file  Transportation Needs: Not on  file  Physical Activity: Not on file  Stress: Not on file  Social Connections: Not on file  Intimate Partner Violence: Not on file    Review of Systems: Positive for *** All other review of systems negative except as mentioned in the HPI.  Physical Exam: Vital signs There were no vitals taken for this visit.  General:   Alert,  Well-developed, well-nourished, pleasant and cooperative in NAD Lungs:  Clear throughout to auscultation.   Heart:  Regular rate and rhythm; no murmurs, clicks, rubs,  or gallops. Abdomen:  Soft, nontender and nondistended. Normal bowel sounds.   Neuro/Psych:  Alert and cooperative. Normal mood and affect. A and O x 3   @Zuleyka Kloc  CHARLENA Commander, MD, [facility] [phone] (pager) 10/21/2023 7:47 PM@

## 2023-10-22 ENCOUNTER — Ambulatory Visit (AMBULATORY_SURGERY_CENTER): Admitting: Internal Medicine

## 2023-10-22 ENCOUNTER — Encounter: Payer: Self-pay | Admitting: Internal Medicine

## 2023-10-22 VITALS — BP 165/96 | HR 54 | Temp 98.1°F | Resp 17 | Ht 67.0 in | Wt 200.0 lb

## 2023-10-22 DIAGNOSIS — I10 Essential (primary) hypertension: Secondary | ICD-10-CM | POA: Diagnosis not present

## 2023-10-22 DIAGNOSIS — E785 Hyperlipidemia, unspecified: Secondary | ICD-10-CM | POA: Diagnosis not present

## 2023-10-22 DIAGNOSIS — Z1211 Encounter for screening for malignant neoplasm of colon: Secondary | ICD-10-CM

## 2023-10-22 MED ORDER — SODIUM CHLORIDE 0.9 % IV SOLN: 500.0000 mL | INTRAVENOUS | Status: DC

## 2023-10-22 NOTE — Op Note (Signed)
 [facility] Patient Name: Angela Garner Procedure Date: 10/22/2023 7:12 AM MRN: [id number] Endoscopist: Lupita FORBES Commander , MD, [id number] Age: 63 Referring MD:  Date of Birth: [birth year] Gender: Female Account #: [id number] Procedure:                Colonoscopy Indications:              Screening for colorectal malignant neoplasm, Last                            colonoscopy: 2015 Medicines:                Monitored Anesthesia Care Procedure:                Pre-Anesthesia Assessment:                           - Prior to the procedure, a History and Physical                            was performed, and patient medications and                            allergies were reviewed. The patient's tolerance of                            previous anesthesia was also reviewed. The risks                            and benefits of the procedure and the sedation                            options and risks were discussed with the patient.                            All questions were answered, and informed consent                            was obtained. Prior Anticoagulants: The patient has                            taken no anticoagulant or antiplatelet agents. ASA                            Grade Assessment: II - A patient with mild systemic                            disease. After reviewing the risks and benefits,                            the patient was deemed in satisfactory condition to                            undergo the procedure.  After obtaining informed consent, the colonoscope                            was passed under direct vision. Throughout the                            procedure, the patient's blood pressure, pulse, and                            oxygen saturations were monitored continuously. The                            Colonoscope was introduced through the anus and                            advanced to the the cecum, identified by                             appendiceal orifice and ileocecal valve. The                            colonoscopy was performed without difficulty. The                            patient tolerated the procedure well. The quality                            of the bowel preparation was good. The ileocecal                            valve, appendiceal orifice, and rectum were                            photographed. The bowel preparation used was SUPREP                            via split dose instruction. Scope In: 8:13:46 AM Scope Out: 8:30:06 AM Scope Withdrawal Time: 0 hours 11 minutes 7 seconds  Total Procedure Duration: 0 hours 16 minutes 20 seconds  Findings:                 The perianal and digital rectal examinations were                            normal.                           The entire examined colon appeared normal on direct                            and retroflexion views. Complications:            No immediate complications. Estimated Blood Loss:     Estimated blood loss: none. Impression:               - The entire examined colon is  normal on direct and                            retroflexion views.                           - No specimens collected. Recommendation:           - Patient has a contact number available for                            emergencies. The signs and symptoms of potential                            delayed complications were discussed with the                            patient. Return to normal activities tomorrow.                            Written discharge instructions were provided to the                            patient.                           - Resume previous diet.                           - Continue present medications.                           - Repeat colonoscopy in 10 years for screening                            purposes. Lupita FORBES Commander, MD 10/22/2023 8:37:15 AM This report has been signed electronically.

## 2023-10-22 NOTE — Patient Instructions (Addendum)
 Colonoscopy was normal again.  No polyps or cancer were seen.  You should repeat a routine repeat colonoscopy or other appropriate screening test in about 10 years, 2035.  I appreciate the opportunity to care for you. Lupita CHARLENA Commander, MD, FACG  YOU HAD AN ENDOSCOPIC PROCEDURE TODAY AT THE [facility]:   Refer to the procedure report that was given to you for any specific questions about what was found during the examination.  If the procedure report does not answer your questions, please call your gastroenterologist to clarify.  If you requested that your care partner not be given the details of your procedure findings, then the procedure report has been included in a sealed envelope for you to review at your convenience later.  YOU SHOULD EXPECT: Some feelings of bloating in the abdomen. Passage of more gas than usual.  Walking can help get rid of the air that was put into your GI tract during the procedure and reduce the bloating. If you had a lower endoscopy (such as a colonoscopy or flexible sigmoidoscopy) you may notice spotting of blood in your stool or on the toilet paper. If you underwent a bowel prep for your procedure, you may not have a normal bowel movement for a few days.  Please Note:  You might notice some irritation and congestion in your nose or some drainage.  This is from the oxygen used during your procedure.  There is no need for concern and it should clear up in a day or so.  SYMPTOMS TO REPORT IMMEDIATELY:  Following lower endoscopy (colonoscopy or flexible sigmoidoscopy):  Excessive amounts of blood in the stool  Significant tenderness or worsening of abdominal pains  Swelling of the abdomen that is new, acute  Fever of 100F or higher  Resume previous diet Continue present medications   For urgent or emergent issues, a gastroenterologist can be reached at any hour by calling [phone]. Do not use MyChart messaging for urgent concerns.    DIET:   We do recommend a small meal at first, but then you may proceed to your regular diet.  Drink plenty of fluids but you should avoid alcoholic beverages for 24 hours.  ACTIVITY:  You should plan to take it easy for the rest of today and you should NOT DRIVE or use heavy machinery until tomorrow (because of the sedation medicines used during the test).    FOLLOW UP: Our staff will call the number listed on your records the next business day following your procedure.  We will call around 7:15- 8:00 am to check on you and address any questions or concerns that you may have regarding the information given to you following your procedure. If we do not reach you, we will leave a message.     If any biopsies were taken you will be contacted by phone or by letter within the next 1-3 weeks.  Please call us  at [phone] if you have not heard about the biopsies in 3 weeks.    SIGNATURES/CONFIDENTIALITY: You and/or your care partner have signed paperwork which will be entered into your electronic medical record.  These signatures attest to the fact that that the information above on your After Visit Summary has been reviewed and is understood.  Full responsibility of the confidentiality of this discharge information lies with you and/or your care-partner.

## 2023-10-22 NOTE — Progress Notes (Signed)
 Pt's states no medical or surgical changes since previsit or office visit.

## 2023-10-22 NOTE — Progress Notes (Signed)
 Sedate, gd SR, tolerated procedure well, VSS, report to RN

## 2023-10-23 ENCOUNTER — Telehealth: Payer: Self-pay | Admitting: *Deleted

## 2023-10-23 NOTE — Telephone Encounter (Signed)
 Attempted post procedure follow up call.  No answer - LVM.

## 2023-11-06 ENCOUNTER — Other Ambulatory Visit: Payer: Self-pay | Admitting: Family Medicine

## 2023-11-06 DIAGNOSIS — E1165 Type 2 diabetes mellitus with hyperglycemia: Secondary | ICD-10-CM

## 2023-11-07 ENCOUNTER — Encounter: Payer: Self-pay | Admitting: Family Medicine

## 2023-11-21 ENCOUNTER — Encounter: Payer: 59 | Admitting: Family Medicine

## 2023-11-29 ENCOUNTER — Ambulatory Visit (INDEPENDENT_AMBULATORY_CARE_PROVIDER_SITE_OTHER): Admitting: Family Medicine

## 2023-11-29 ENCOUNTER — Encounter: Payer: Self-pay | Admitting: Family Medicine

## 2023-11-29 VITALS — BP 138/88 | HR 68 | Temp 98.6°F | Resp 18 | Ht 67.0 in | Wt 195.6 lb

## 2023-11-29 DIAGNOSIS — E1169 Type 2 diabetes mellitus with other specified complication: Secondary | ICD-10-CM | POA: Diagnosis not present

## 2023-11-29 DIAGNOSIS — I10 Essential (primary) hypertension: Secondary | ICD-10-CM

## 2023-11-29 DIAGNOSIS — E785 Hyperlipidemia, unspecified: Secondary | ICD-10-CM | POA: Diagnosis not present

## 2023-11-29 DIAGNOSIS — E1165 Type 2 diabetes mellitus with hyperglycemia: Secondary | ICD-10-CM | POA: Diagnosis not present

## 2023-11-29 DIAGNOSIS — Z0000 Encounter for general adult medical examination without abnormal findings: Secondary | ICD-10-CM | POA: Diagnosis not present

## 2023-11-29 LAB — COMPREHENSIVE METABOLIC PANEL WITH GFR
ALT: 21 U/L (ref 0–35)
AST: 24 U/L (ref 0–37)
Albumin: 4.6 g/dL (ref 3.5–5.2)
Alkaline Phosphatase: 77 U/L (ref 39–117)
BUN: 14 mg/dL (ref 6–23)
CO2: 33 meq/L — ABNORMAL HIGH (ref 19–32)
Calcium: 9.7 mg/dL (ref 8.4–10.5)
Chloride: 100 meq/L (ref 96–112)
Creatinine, Ser: 0.78 mg/dL (ref 0.40–1.20)
GFR: 80.41 mL/min (ref >60.00)
Glucose, Bld: 107 mg/dL — ABNORMAL HIGH (ref 70–99)
Potassium: 3.7 meq/L (ref 3.5–5.1)
Sodium: 143 meq/L (ref 135–145)
Total Bilirubin: 0.8 mg/dL (ref 0.2–1.2)
Total Protein: 7.4 g/dL (ref 6.0–8.3)

## 2023-11-29 LAB — CBC WITH DIFFERENTIAL/PLATELET
Basophils Absolute: 0 K/uL (ref 0.0–0.1)
Basophils Relative: 1.4 % (ref 0.0–3.0)
Eosinophils Absolute: 0.1 K/uL (ref 0.0–0.7)
Eosinophils Relative: 2 % (ref 0.0–5.0)
HCT: 47 % — ABNORMAL HIGH (ref 36.0–46.0)
Hemoglobin: 15.6 g/dL — ABNORMAL HIGH (ref 12.0–15.0)
Lymphocytes Relative: 50.3 % — ABNORMAL HIGH (ref 12.0–46.0)
Lymphs Abs: 1.7 K/uL (ref 0.7–4.0)
MCHC: 33.3 g/dL (ref 30.0–36.0)
MCV: 93.4 fl (ref 78.0–100.0)
Monocytes Absolute: 0.4 K/uL (ref 0.1–1.0)
Monocytes Relative: 10.3 % (ref 3.0–12.0)
Neutro Abs: 1.2 K/uL — ABNORMAL LOW (ref 1.4–7.7)
Neutrophils Relative %: 36 % — ABNORMAL LOW (ref 43.0–77.0)
Platelets: 180 K/uL (ref 150.0–400.0)
RBC: 5.03 Mil/uL (ref 3.87–5.11)
RDW: 13.9 % (ref 11.5–15.5)
WBC: 3.4 K/uL — ABNORMAL LOW (ref 4.0–10.5)

## 2023-11-29 LAB — LIPID PANEL
Cholesterol: 238 mg/dL — ABNORMAL HIGH (ref 0–200)
HDL: 87.5 mg/dL (ref >39.00)
LDL Cholesterol: 140 mg/dL — ABNORMAL HIGH (ref 0–99)
NonHDL: 150.41
Total CHOL/HDL Ratio: 3
Triglycerides: 50 mg/dL (ref 0.0–149.0)
VLDL: 10 mg/dL (ref 0.0–40.0)

## 2023-11-29 LAB — MICROALBUMIN / CREATININE URINE RATIO
Creatinine,U: 114.6 mg/dL
Microalb Creat Ratio: 7.8 mg/g (ref 0.0–30.0)
Microalb, Ur: 0.9 mg/dL (ref 0.0–1.9)

## 2023-11-29 LAB — HEMOGLOBIN A1C: Hgb A1c MFr Bld: 6.4 % (ref 4.6–6.5)

## 2023-11-29 NOTE — Assessment & Plan Note (Signed)
 Ghm utd Check labs  See AVS  Health Maintenance  Topic Date Due   DTaP/Tdap/Td (1 - Tdap) Never done   Zoster Vaccines- Shingrix (1 of 2) Never done   Diabetic kidney evaluation - Urine ACR  10/01/2015   COVID-19 Vaccine (4 - 2024-25 season) 12/23/2022   INFLUENZA VACCINE  11/22/2023   HEMOGLOBIN A1C  11/24/2023   OPHTHALMOLOGY EXAM  04/03/2024   MAMMOGRAM  04/07/2024   FOOT EXAM  05/26/2024   Diabetic kidney evaluation - eGFR measurement  09/18/2024   Cervical Cancer Screening (HPV/Pap Cotest)  04/07/2028   Colonoscopy  10/21/2033   Pneumococcal Vaccine: 19-49 Years  Completed   Pneumococcal Vaccine: 50+ Years  Completed   Hepatitis C Screening  Completed   HIV Screening  Completed   Hepatitis B Vaccines  Aged Out   HPV VACCINES  Aged Out   Meningococcal B Vaccine  Aged Out

## 2023-11-29 NOTE — Assessment & Plan Note (Addendum)
 Well controlled, no changes to meds. Encouraged heart healthy diet such as the DASH diet and exercise as tolerated.   Pt had not taken bp med yet today

## 2023-11-29 NOTE — Assessment & Plan Note (Signed)
 Encourage heart healthy diet such as MIND or DASH diet, increase exercise, avoid trans fats, simple carbohydrates and processed foods, consider a krill or fish or flaxseed oil cap daily.

## 2023-11-29 NOTE — Assessment & Plan Note (Signed)
 Hgba1c to be checked  minimize simple carbs. Increase exercise as tolerated. Continue current meds

## 2023-11-29 NOTE — Progress Notes (Signed)
 3  Subjective:    Patient ID: Angela Garner, female    DOB: May 03, 1960, 63 y.o.   MRN: [id number]  Chief Complaint  Patient presents with   Annual Exam    Pt states fasting     HPI Patient is in today for cpe.   Discussed the use of AI scribe software for clinical note transcription with the patient, who gave verbal consent to proceed.  History of Present Illness Angela Garner is a 63 year old female who presents for an annual physical exam.  She attributes her blood pressure being high to not having taken her medication and poor sleep. She does not regularly monitor her blood pressure at home despite having a monitor.  She has been working as a Engineer, structural for the past two years after being let go from her previous job at [employer] and [employer]. The change in employment has affected her insurance coverage and benefits. She describes the work environment at her previous job as toxic, which influenced her decision to change jobs.  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  She had a colonoscopy in July and was told she would not need another for ten years. She had a mammogram and an eye exam in December. She has not received the shingles vaccine and is unsure about her tetanus vaccination status.  She occasionally checks her blood sugars and reports they are 'not too bad'. She is unsure if she has had a bone density test.  She experiences joint pain intermittently and believes walking helps alleviate it. She is concerned about her weight, noting that she was doing well at one point but has since gained weight. She mentions needing to stretch her back due to her caregiving duties.  ! Past Medical History:  Diagnosis Date   Abdominal pain 05/07/2017   Abdominal pain, epigastric 02/28/2007   Qualifier: Diagnosis of  By: Tita MD, Luis     Acute left-sided low back  pain with left-sided sciatica 01/14/2015   Acute left-sided low back pain without sciatica 05/09/2017   ALLERGIC RHINITIS 05/15/2006   Qualifier: Diagnosis of  By: Nicholaus Channel     Allergy    Arm numbness 03/22/2020   BACK PAIN 10/31/2006   Qualifier: Diagnosis of  By: Joshua CMA, Chemira     Bilateral lower extremity edema 10/19/2019   Bronchitis 09/14/2021   Chronic pain of right knee 11/11/2018   Chronic venous insufficiency 01/13/2019   Controlled type 2 diabetes mellitus with chronic kidney disease, without long-term current use of insulin (HCC) 09/20/2020   CTS (carpal tunnel syndrome) 03/17/2013   Diabetes mellitus without complication (HCC)    no meds, only check bs   Elevated CK 06/07/2021   Essential hypertension 11/05/2007   Qualifier: Diagnosis of  By: Antonio ROSALEA Rockers     Fatigue 09/20/2020   GAS/BLOATING 02/28/2007   Qualifier: Diagnosis of  By: Tita MD, Luis     GASTROENTERITIS 02/28/2007   Qualifier: Diagnosis of  By: Tita MD, Luis     Greater trochanteric bursitis of left hip 05/09/2017   Hyperglycemia 07/17/2012   Hyperlipidemia    Hyperlipidemia associated with type 2 diabetes mellitus (HCC) 10/19/2019   HYPERTENSION, BENIGN ESSENTIAL 10/31/2006   Qualifier: Diagnosis of  By: Joshua CMA, Chemira     KNEE PAIN 01/30/2007   Qualifier: Diagnosis of  By:  Tita MD, Luis     Lymphocytosis 11/06/2011   MURMUR 11/20/2007   Qualifier: Diagnosis of  By: Antonio ROSALEA Rockers     Neck pain 06/07/2021   Palpitations 09/20/2020   Primary hypertension 03/22/2020   SINUSITIS- ACUTE-NOS 04/02/2007   Qualifier: Diagnosis of  By: Tita MD, Luis     Uncontrolled type 2 diabetes mellitus with hyperglycemia (HCC) 01/30/2019   Urinary frequency 07/17/2012   Urinary incontinence 11/11/2018   Varicose 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 11/11/2018   Vitamin D deficiency 03/22/2020    Past Surgical History:  Procedure Laterality Date   COLONOSCOPY     UTERINE FIBROID SURGERY       Family History  Problem Relation Age of Onset   Dementia Mother    Hypertension Father    Diabetes Father    Heart attack Father    Diabetes Sister        she had stomach pain-- cause unknown   Clotting disorder Brother    Colon cancer Neg Hx    Esophageal cancer Neg Hx    Stomach cancer Neg Hx    Rectal cancer Neg Hx     Social History   Socioeconomic History   Marital status: Divorced    Spouse name: Not on file   Number of children: Not on file   Years of education: Not on file   Highest education level: Not on file  Occupational History   Occupation: [employer]     End: 2023    Comment: [employer]   Occupation: [employer]    Start: 2023  Tobacco Use   Smoking status: Never   Smokeless tobacco: Never  Vaping Use   Vaping status: Never Used  Substance and Sexual Activity   Alcohol use: No   Drug use: No   Sexual activity: Yes  Other Topics Concern   Not on file  Social History Narrative   Not on file   Social Drivers of Health   Financial Resource Strain: Not on file  Food Insecurity: Not on file  Transportation Needs: Not on file  Physical Activity: Not on file  Stress: Not on file  Social Connections: Not on file  Intimate Partner Violence: Not on file    Outpatient Medications Prior to Visit  Medication Sig Dispense Refill   albuterol  (PROVENTIL  HFA) 108 (90 Base) MCG/ACT inhaler Inhale 2 puffs into the lungs every 6 (six) hours as needed for wheezing or shortness of breath. (Patient not taking: Reported on 11/29/2023) 18 g 3   amLODipine  (NORVASC ) 2.5 MG tablet Take 1 tablet by mouth once daily 90 tablet 1   benzonatate  (TESSALON ) 100 MG capsule Take 1 capsule (100 mg total) by mouth 2 (two) times daily as needed for cough. 20 capsule 0   blood glucose meter kit and supplies KIT Check glucose daily 1 each 0   Dulaglutide  (TRULICITY ) 0.75 MG/0.5ML SOAJ Inject 0.75 mg into the skin once a week. 2 mL 1   estradiol (ESTRACE) 0.1  MG/GM vaginal cream Place 1 g vaginally 2 (two) times a week.     fluticasone  (FLONASE ) 50 MCG/ACT nasal spray Place 2 sprays into both nostrils daily. (Patient taking differently: Place 2 sprays into both nostrils as needed for allergies or rhinitis.) 16 g 6   levocetirizine (XYZAL ) 5 MG tablet TAKE 1 TABLET BY MOUTH ONCE DAILY IN THE EVENING (Patient taking differently: Take 5 mg by mouth daily as needed.) 30 tablet 2  NONFORMULARY OR COMPOUNDED ITEM Compression stocking -- thigh high  20-30 mm/hg #1  Dx varicose veint 1 each 0   olmesartan  (BENICAR ) 40 MG tablet Take 1 tablet by mouth once daily 90 tablet 1   rosuvastatin  (CRESTOR ) 5 MG tablet Take 1 tablet by mouth once daily 90 tablet 1   famotidine  (PEPCID ) 20 MG tablet Take 1 tablet (20 mg total) by mouth 2 (two) times daily. (Patient not taking: Reported on 11/29/2023) 60 tablet 5   multivitamin-iron-minerals-folic acid  (CENTRUM) chewable tablet Chew 1 tablet by mouth daily. (Patient not taking: Reported on 11/29/2023)     nitrofurantoin , macrocrystal-monohydrate, (MACROBID ) 100 MG capsule Take 1 capsule (100 mg total) by mouth 2 (two) times daily. (Patient not taking: Reported on 11/29/2023) 6 capsule 0   No facility-administered medications prior to visit.    Allergies  Allergen Reactions   Metformin  And Related Other (See Comments)   Pravachol  [Pravastatin  Sodium] Other (See Comments)    Muscle aches   Simvastatin  Other (See Comments)    leg pains    Review of Systems  Constitutional:  Negative for chills, fever and malaise/fatigue.  HENT:  Negative for congestion and hearing loss.   Eyes:  Negative for blurred vision and discharge.  Respiratory:  Negative for cough, sputum production and shortness of breath.   Cardiovascular:  Negative for chest pain, palpitations and leg swelling.  Gastrointestinal:  Negative for abdominal pain, blood in stool, constipation, diarrhea, heartburn, nausea and vomiting.  Genitourinary:  Negative  for dysuria, frequency, hematuria and urgency.  Musculoskeletal:  Negative for back pain, falls and myalgias.  Skin:  Negative for rash.  Neurological:  Negative for dizziness, sensory change, loss of consciousness, weakness and headaches.  Endo/Heme/Allergies:  Negative for environmental allergies. Does not bruise/bleed easily.  Psychiatric/Behavioral:  Negative for depression and suicidal ideas. The patient is not nervous/anxious and does not have insomnia.        Objective:    Physical Exam Vitals and nursing note reviewed.  Constitutional:      General: She is not in acute distress.    Appearance: Normal appearance. She is well-developed.  HENT:     Head: Normocephalic and atraumatic.     Right Ear: Tympanic membrane, ear canal and external ear normal. There is no impacted cerumen.     Left Ear: Tympanic membrane, ear canal and external ear normal. There is no impacted cerumen.     Nose: Nose normal.     Mouth/Throat:     Mouth: Mucous membranes are moist.     Pharynx: Oropharynx is clear. No oropharyngeal exudate or posterior oropharyngeal erythema.  Eyes:     General: No scleral icterus.       Right eye: No discharge.        Left eye: No discharge.     Conjunctiva/sclera: Conjunctivae normal.     Pupils: Pupils are equal, round, and reactive to light.  Neck:     Thyroid : No thyromegaly or thyroid  tenderness.     Vascular: No JVD.  Cardiovascular:     Rate and Rhythm: Normal rate and regular rhythm.     Heart sounds: Normal heart sounds. No murmur heard. Pulmonary:     Effort: Pulmonary effort is normal. No respiratory distress.     Breath sounds: Normal breath sounds.  Abdominal:     General: Bowel sounds are normal. There is no distension.     Palpations: Abdomen is soft. There is no mass.     Tenderness: There  is no abdominal tenderness. There is no guarding or rebound.  Musculoskeletal:        General: Normal range of motion.     Cervical back: Normal range of  motion and neck supple.     Right lower leg: No edema.     Left lower leg: No edema.  Lymphadenopathy:     Cervical: No cervical adenopathy.  Skin:    General: Skin is warm and dry.     Findings: No erythema or rash.  Neurological:     Mental Status: She is alert and oriented to person, place, and time.     Cranial Nerves: No cranial nerve deficit.     Deep Tendon Reflexes: Reflexes are normal and symmetric.  Psychiatric:        Mood and Affect: Mood normal.        Behavior: Behavior normal.        Thought Content: Thought content normal.        Judgment: Judgment normal.     BP 138/88 (BP Location: Left Arm, Patient Position: Sitting, Cuff Size: Normal)   Pulse 68   Temp 98.6 F (37 C) (Oral)   Resp 18   Ht 5' 7 (1.702 m)   Wt 195 lb 9.6 oz (88.7 kg)   SpO2 98%   BMI 30.64 kg/m  Wt Readings from Last 3 Encounters:  11/29/23 195 lb 9.6 oz (88.7 kg)  10/22/23 200 lb (90.7 kg)  10/07/23 199 lb 6.4 oz (90.4 kg)    Diabetic Foot Exam - Simple   No data filed    Lab Results  Component Value Date   WBC 4.8 05/27/2023   HGB 15.2 (H) 05/27/2023   HCT 46.4 (H) 05/27/2023   PLT 180.0 05/27/2023   GLUCOSE 99 05/27/2023   CHOL 195 05/27/2023   TRIG 46.0 05/27/2023   HDL 85.90 05/27/2023   LDLDIRECT 120.9 08/02/2011   LDLCALC 100 (H) 05/27/2023   ALT 21 05/27/2023   AST 24 05/27/2023   NA 143 05/27/2023   K 3.7 05/27/2023   CL 104 05/27/2023   CREATININE 0.80 05/27/2023   BUN 13 05/27/2023   CO2 31 05/27/2023   TSH 2.81 05/27/2023   HGBA1C 6.3 05/27/2023   MICROALBUR 5.7 (H) 10/01/2014    Lab Results  Component Value Date   TSH 2.81 05/27/2023   Lab Results  Component Value Date   WBC 4.8 05/27/2023   HGB 15.2 (H) 05/27/2023   HCT 46.4 (H) 05/27/2023   MCV 94.7 05/27/2023   PLT 180.0 05/27/2023   Lab Results  Component Value Date   NA 143 05/27/2023   K 3.7 05/27/2023   CO2 31 05/27/2023   GLUCOSE 99 05/27/2023   BUN 13 05/27/2023   CREATININE  0.80 05/27/2023   BILITOT 0.6 05/27/2023   ALKPHOS 84 05/27/2023   AST 24 05/27/2023   ALT 21 05/27/2023   PROT 7.0 05/27/2023   ALBUMIN 4.3 05/27/2023   CALCIUM  9.3 05/27/2023   EGFR 79.0 09/19/2023   GFR 78.28 05/27/2023   Lab Results  Component Value Date   CHOL 195 05/27/2023   Lab Results  Component Value Date   HDL 85.90 05/27/2023   Lab Results  Component Value Date   LDLCALC 100 (H) 05/27/2023   Lab Results  Component Value Date   TRIG 46.0 05/27/2023   Lab Results  Component Value Date   CHOLHDL 2 05/27/2023   Lab Results  Component Value Date   HGBA1C 6.3  05/27/2023       Assessment & Plan:  Preventative health care Assessment & Plan: Ghm utd Check labs  See AVS  Health Maintenance  Topic Date Due   DTaP/Tdap/Td (1 - Tdap) Never done   Zoster Vaccines- Shingrix (1 of 2) Never done   Diabetic kidney evaluation - Urine ACR  10/01/2015   COVID-19 Vaccine (4 - 2024-25 season) 12/23/2022   INFLUENZA VACCINE  11/22/2023   HEMOGLOBIN A1C  11/24/2023   OPHTHALMOLOGY EXAM  04/03/2024   MAMMOGRAM  04/07/2024   FOOT EXAM  05/26/2024   Diabetic kidney evaluation - eGFR measurement  09/18/2024   Cervical Cancer Screening (HPV/Pap Cotest)  04/07/2028   Colonoscopy  10/21/2033   Pneumococcal Vaccine: 19-49 Years  Completed   Pneumococcal Vaccine: 50+ Years  Completed   Hepatitis C Screening  Completed   HIV Screening  Completed   Hepatitis B Vaccines  Aged Out   HPV VACCINES  Aged Out   Meningococcal B Vaccine  Aged Out     Orders: -     Lipid panel -     CBC with Differential/Platelet -     Comprehensive metabolic panel with GFR -     Hemoglobin A1c -     Microalbumin / creatinine urine ratio  Uncontrolled type 2 diabetes mellitus with hyperglycemia (HCC) Assessment & Plan: Hgba1c to be checked minimize simple carbs. Increase exercise as tolerated. Continue current meds   Orders: -     Comprehensive metabolic panel with GFR -     Hemoglobin  A1c -     Microalbumin / creatinine urine ratio  Primary hypertension Assessment & Plan: Well controlled, no changes to meds. Encouraged heart healthy diet such as the DASH diet and exercise as tolerated.   Pt had not taken bp med yet today  Orders: -     CBC with Differential/Platelet  Hyperlipidemia associated with type 2 diabetes mellitus (HCC) Assessment & Plan: Encourage heart healthy diet such as MIND or DASH diet, increase exercise, avoid trans fats, simple carbohydrates and processed foods, consider a krill or fish or flaxseed oil cap daily.    Orders: -     Lipid panel -     Comprehensive metabolic panel with GFR  Assessment and Plan Assessment & Plan Adult Wellness Visit   During her routine adult wellness visit, health maintenance topics were discussed, including vaccinations and screenings. She declined the shingles vaccine today due to concerns about flu-like symptoms and a sore arm. Her tetanus vaccination status will be confirmed, as it is effective for ten years. She was advised to receive the flu vaccination before the upcoming season. The importance of bone density screening was discussed, and she was advised to inquire about it during her next mammogram.  Hypertension   Her blood pressure is elevated, possibly due to non-adherence to medication and lack of sleep. She does not report home monitoring. Regular home blood pressure monitoring was encouraged.  Type 2 Diabetes Mellitus   Her blood sugars are not regularly monitored, but no significant issues have been reported. Regular blood sugar monitoring was encouraged.  Overweight   Weight gain has been observed. Trulicity  was discussed for weight management and appetite suppression. She was encouraged to increase physical activity and utilize her gym membership. The importance of maintaining a healthy diet was discussed.  Arthralgia   She experiences joint pain. Regular physical activity was advised to manage joint  pain.    Albi Rappaport R Lowne Chase, DO

## 2023-12-01 ENCOUNTER — Ambulatory Visit: Payer: Self-pay | Admitting: Family Medicine

## 2023-12-04 ENCOUNTER — Ambulatory Visit: Admitting: Obstetrics and Gynecology

## 2023-12-04 ENCOUNTER — Encounter: Payer: Self-pay | Admitting: Obstetrics and Gynecology

## 2023-12-04 VITALS — BP 141/94 | HR 69

## 2023-12-04 DIAGNOSIS — N3281 Overactive bladder: Secondary | ICD-10-CM

## 2023-12-04 NOTE — Progress Notes (Signed)
 [facility] Return Visit  SUBJECTIVE  History of Present Illness: Angela Garner is a 63 y.o. female seen in follow-up for incontinence, urge predominant. Plan at last visit was pelvic PT. She has tried oxybutynin, myrbetriq and PTNS.   She completed 4 sessions of pelvic PT back in December. Still waking 3-5 times a night, usually wets herself before she gets to the bathroom (wears depends). During the day, she is able to hold her bladder longer but still has some urgency and leakage several times. She is frustrated because she travels a lot and this is impacting her ability to feel comfortable.   Denies leakage with cough/ sneeze.   Past Medical History: Patient  has a past medical history of Abdominal pain (05/07/2017), Abdominal pain, epigastric (02/28/2007), Acute left-sided low back pain with left-sided sciatica (01/14/2015), Acute left-sided low back pain without sciatica (05/09/2017), ALLERGIC RHINITIS (05/15/2006), Allergy, Arm numbness (03/22/2020), BACK PAIN (10/31/2006), Bilateral lower extremity edema (10/19/2019), Bronchitis (09/14/2021), Chronic pain of right knee (11/11/2018), Chronic venous insufficiency (01/13/2019), Controlled type 2 diabetes mellitus with chronic kidney disease, without long-term current use of insulin (HCC) (09/20/2020), CTS (carpal tunnel syndrome) (03/17/2013), Diabetes mellitus without complication (HCC), Elevated CK (06/07/2021), Essential hypertension (11/05/2007), Fatigue (09/20/2020), GAS/BLOATING (02/28/2007), GASTROENTERITIS (02/28/2007), Greater trochanteric bursitis of left hip (05/09/2017), Hyperglycemia (07/17/2012), Hyperlipidemia, Hyperlipidemia associated with type 2 diabetes mellitus (HCC) (10/19/2019), HYPERTENSION, BENIGN ESSENTIAL (10/31/2006), KNEE PAIN (01/30/2007), Lymphocytosis (11/06/2011), MURMUR (11/20/2007), Neck pain (06/07/2021), Palpitations (09/20/2020), Primary hypertension (03/22/2020), SINUSITIS- ACUTE-NOS (04/02/2007), Uncontrolled type 2  diabetes mellitus with hyperglycemia (HCC) (01/30/2019), Urinary frequency (07/17/2012), Urinary incontinence (11/11/2018), Varicose 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 (11/11/2018), and Vitamin D deficiency (03/22/2020).   Past Surgical History: She  has a past surgical history that includes Uterine fibroid surgery and Colonoscopy.   Medications: She has a current medication list which includes the following prescription(s): albuterol, amlodipine, blood glucose meter kit and supplies, coenzyme q10, trulicity, estradiol, famotidine, levocetirizine, multivitamin-iron-minerals-folic acid, NONFORMULARY OR COMPOUNDED ITEM, olmesartan, rosuvastatin, and vitamin d.   Allergies: Patient is allergic to metformin and related, pravachol [pravastatin sodium], and simvastatin.   Social History: Patient  reports that she has never smoked. She has never used smokeless tobacco. She reports that she does not drink alcohol and does not use drugs.     OBJECTIVE     Physical Exam: Vitals:   12/04/23 0833  BP: (!) 141/94  Pulse: 69   Gen: No apparent distress, A&O x 3.  Detailed Urogynecologic Evaluation:  Deferred.    ASSESSMENT AND PLAN    Ms. Kewley is a 63 y.o. with:  1. Overactive bladder    - For refractory OAB we reviewed the procedure for intravesical Botox injection with cystoscopy in the office and reviewed the risks, benefits and alternatives of treatment including but not limited to infection, need for self-catheterization and need for repeat therapy.  We discussed that there is a 5-15% chance of needing to catheterize with Botox and that this usually resolves in a few months; however can persist for longer periods of time.  Typically Botox injections would need to be repeated every 3-12 months since this is not a permanent therapy.  - We discussed the role of sacral neuromodulation and how it works. It requires a test phase, and documentation of bladder function via diary. After a  successful test period, a permanent wire and generator are placed in the OR. The battery lasts 10-15 years on average and would need to be replaced surgically.  The goal of this therapy is at least a 50% improvement in symptoms. It is NOT realistic to expect a 100% cure.  We reviewed the fact that about 30% of patients fail the test phase and are not candidates for permanent generator placement. - She is considering these two options and will let us  know how she wants to proceed.   [name], MD  Time spent: I spent 30 minutes dedicated to the care of this patient on the date of this encounter to include pre-visit review of records, face-to-face time with the patient and post visit documentation.

## 2023-12-04 NOTE — Progress Notes (Signed)
 [facility] Return Visit  SUBJECTIVE  History of Present Illness: Angela Garner is a 63 y.o. female seen in follow-up for incontinence, urge predominant. Plan at last visit was pelvic PT. She has tried oxybutynin, myrbetriq  and PTNS.   She completed 4 sessions of pelvic PT back in December. Still waking 3-5 times a night, usually wets herself before she gets to the bathroom (wears depends). During the day, she is able to hold her bladder longer but still has some urgency and leakage several times. She is frustrated because she travels a lot and this is impacting her ability to feel comfortable.   Denies leakage with cough/ sneeze.   Past Medical History: Patient  has a past medical history of Abdominal pain (05/07/2017), Abdominal pain, epigastric (02/28/2007), Acute left-sided low back pain with left-sided sciatica (01/14/2015), Acute left-sided low back pain without sciatica (05/09/2017), ALLERGIC RHINITIS (05/15/2006), Allergy, Arm numbness (03/22/2020), BACK PAIN (10/31/2006), Bilateral lower extremity edema (10/19/2019), Bronchitis (09/14/2021), Chronic pain of right knee (11/11/2018), Chronic venous insufficiency (01/13/2019), Controlled type 2 diabetes mellitus with chronic kidney disease, without long-term current use of insulin (HCC) (09/20/2020), CTS (carpal tunnel syndrome) (03/17/2013), Diabetes mellitus without complication (HCC), Elevated CK (06/07/2021), Essential hypertension (11/05/2007), Fatigue (09/20/2020), GAS/BLOATING (02/28/2007), GASTROENTERITIS (02/28/2007), Greater trochanteric bursitis of left hip (05/09/2017), Hyperglycemia (07/17/2012), Hyperlipidemia, Hyperlipidemia associated with type 2 diabetes mellitus (HCC) (10/19/2019), HYPERTENSION, BENIGN ESSENTIAL (10/31/2006), KNEE PAIN (01/30/2007), Lymphocytosis (11/06/2011), MURMUR (11/20/2007), Neck pain (06/07/2021), Palpitations (09/20/2020), Primary hypertension (03/22/2020), SINUSITIS- ACUTE-NOS (04/02/2007), Uncontrolled type 2  diabetes mellitus with hyperglycemia (HCC) (01/30/2019), Urinary frequency (07/17/2012), Urinary incontinence (11/11/2018), Varicose 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 (11/11/2018), and Vitamin D deficiency (03/22/2020).   Past Surgical History: She  has a past surgical history that includes Uterine fibroid surgery and Colonoscopy.   Medications: She has a current medication list which includes the following prescription(s): albuterol , amlodipine , blood glucose meter kit and supplies, coenzyme q10, trulicity , estradiol, famotidine , levocetirizine, multivitamin-iron-minerals-folic acid , NONFORMULARY OR COMPOUNDED ITEM, olmesartan , rosuvastatin , and vitamin d.   Allergies: Patient is allergic to metformin  and related, pravachol  [pravastatin  sodium], and simvastatin .   Social History: Patient  reports that she has never smoked. She has never used smokeless tobacco. She reports that she does not drink alcohol and does not use drugs.     OBJECTIVE     Physical Exam: Vitals:   12/04/23 0833  BP: (!) 141/94  Pulse: 69   Gen: No apparent distress, A&O x 3.  Detailed Urogynecologic Evaluation:  Deferred.    ASSESSMENT AND PLAN    Angela Garner is a 63 y.o. with:  1. Overactive bladder    - For refractory OAB we reviewed the procedure for intravesical Botox injection with cystoscopy in the office and reviewed the risks, benefits and alternatives of treatment including but not limited to infection, need for self-catheterization and need for repeat therapy.  We discussed that there is a 5-15% chance of needing to catheterize with Botox and that this usually resolves in a few months; however can persist for longer periods of time.  Typically Botox injections would need to be repeated every 3-12 months since this is not a permanent therapy.  - We discussed the role of sacral neuromodulation and how it works. It requires a test phase, and documentation of bladder function via diary. After a  successful test period, a permanent wire and generator are placed in the OR. The battery lasts 10-15 years on average and would need to be replaced surgically.  The goal of this therapy is at least a 50% improvement in symptoms. It is NOT realistic to expect a 100% cure.  We reviewed the fact that about 30% of patients fail the test phase and are not candidates for permanent generator placement. - She is considering these two options and will let us  know how she wants to proceed.   Angela LOISE Caper, MD  Time spent: I spent 30 minutes dedicated to the care of this patient on the date of this encounter to include pre-visit review of records, face-to-face time with the patient and post visit documentation.

## 2023-12-26 ENCOUNTER — Other Ambulatory Visit: Payer: Self-pay | Admitting: Family Medicine

## 2023-12-26 DIAGNOSIS — E1165 Type 2 diabetes mellitus with hyperglycemia: Secondary | ICD-10-CM

## 2024-01-23 ENCOUNTER — Other Ambulatory Visit: Payer: Self-pay | Admitting: Family Medicine

## 2024-01-23 DIAGNOSIS — I10 Essential (primary) hypertension: Secondary | ICD-10-CM

## 2024-01-27 ENCOUNTER — Telehealth: Payer: Self-pay

## 2024-01-27 ENCOUNTER — Other Ambulatory Visit (HOSPITAL_COMMUNITY): Payer: Self-pay

## 2024-01-27 NOTE — Telephone Encounter (Signed)
 Pharmacy Patient Advocate Encounter   Received notification from CoverMyMeds that prior authorization for Trulicity  0.75MG /0.5ML auto-injectors  is required/requested.   Insurance verification completed.   The patient is insured through [facility].   Per test claim: PA required; PA submitted to above mentioned insurance via Latent Key/confirmation #/EOC [id number] Status is pending

## 2024-01-28 ENCOUNTER — Other Ambulatory Visit (HOSPITAL_COMMUNITY): Payer: Self-pay

## 2024-01-28 NOTE — Telephone Encounter (Signed)
 Pharmacy Patient Advocate Encounter  Received notification from [facility] that Prior Authorization for Trulicity  0.75MG /0.5ML auto-injectors   has been APPROVED from 01/28/2024 to 01/27/2025  PLEASE BE ADVISED LETTER OF APPROVAL HAS BEEN SCANNED IN MEDIA OF CHART  PA #/Case ID/Reference #: [id number]

## 2024-02-07 ENCOUNTER — Other Ambulatory Visit: Payer: Self-pay | Admitting: Family Medicine

## 2024-02-07 DIAGNOSIS — E785 Hyperlipidemia, unspecified: Secondary | ICD-10-CM

## 2024-03-09 ENCOUNTER — Ambulatory Visit: Admitting: Family Medicine

## 2024-03-09 ENCOUNTER — Encounter: Payer: Self-pay | Admitting: Family Medicine

## 2024-03-09 VITALS — BP 140/100 | HR 72 | Temp 98.9°F | Resp 16 | Ht 67.0 in | Wt 196.2 lb

## 2024-03-09 DIAGNOSIS — E1169 Type 2 diabetes mellitus with other specified complication: Secondary | ICD-10-CM

## 2024-03-09 DIAGNOSIS — E785 Hyperlipidemia, unspecified: Secondary | ICD-10-CM | POA: Diagnosis not present

## 2024-03-09 DIAGNOSIS — Z23 Encounter for immunization: Secondary | ICD-10-CM

## 2024-03-09 DIAGNOSIS — E1165 Type 2 diabetes mellitus with hyperglycemia: Secondary | ICD-10-CM | POA: Diagnosis not present

## 2024-03-09 DIAGNOSIS — M5442 Lumbago with sciatica, left side: Secondary | ICD-10-CM

## 2024-03-09 DIAGNOSIS — G8929 Other chronic pain: Secondary | ICD-10-CM | POA: Diagnosis not present

## 2024-03-09 DIAGNOSIS — I10 Essential (primary) hypertension: Secondary | ICD-10-CM | POA: Diagnosis not present

## 2024-03-09 MED ORDER — OLMESARTAN MEDOXOMIL 40 MG PO TABS: 40.0000 mg | ORAL_TABLET | Freq: Every day | ORAL | 1 refills | Status: AC

## 2024-03-09 MED ORDER — MELOXICAM 15 MG PO TABS
15.0000 mg | ORAL_TABLET | Freq: Every day | ORAL | 0 refills | Status: AC
Start: 2024-03-09 — End: ?

## 2024-03-09 MED ORDER — AMLODIPINE BESYLATE 2.5 MG PO TABS: 2.5000 mg | ORAL_TABLET | Freq: Every day | ORAL | 1 refills | Status: AC

## 2024-03-09 MED ORDER — METHOCARBAMOL 500 MG IVPB - SIMPLE MED: 500.0000 mg | Freq: Four times a day (QID) | INTRAVENOUS | 1 refills | Status: AC | PRN

## 2024-03-09 NOTE — Assessment & Plan Note (Signed)
 Well controlled, no changes to meds. Encouraged heart healthy diet such as the DASH diet and exercise as tolerated.

## 2024-03-09 NOTE — Progress Notes (Signed)
 Subjective:    Patient ID: Angela Garner, female    DOB: 03/26/1961, 63 y.o.   MRN: [id number]  Chief Complaint  Patient presents with   Back Pain    Lower back pain, x2 weeks,     HPI Patient is in today for low back pain with radiation down L thigh.  Discussed the use of AI scribe software for clinical note transcription with the patient, who gave verbal consent to proceed.  History of Present Illness Angela Garner is a 63 year old female who presents for evaluation of her chronic back pain and medication management.  She has chronic back pain that occasionally flares up, localized to a specific spot on her back without significant radiation down her leg. She manages the pain with stretching exercises and occasionally takes Tylenol . She has not used Salonpas patches recently due to lack of availability. No recent falls or specific incidents have aggravated the pain.  She also experiences ankle pain, which she attributes to walking barefoot. 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  She has a history of urinary incontinence and was scheduled for a Botox procedure, but the schedule was full. She experiences leg spasms more frequently than bladder spasms.  She is currently out of her blood pressure medication and is concerned about her blood pressure management. She did not take her medication today as she ran out of time to refill it. She has a family history of hypertension-related complications, including a distant cousin who experienced a mini-stroke.    Past Medical History:  Diagnosis Date   Abdominal pain 05/07/2017   Abdominal pain, epigastric 02/28/2007   Qualifier: Diagnosis of  By: Tita MD, Luis     Acute left-sided low back pain with left-sided sciatica 01/14/2015   Acute left-sided low back pain without sciatica 05/09/2017   ALLERGIC RHINITIS 05/15/2006   Qualifier: Diagnosis of  By: Nicholaus Channel      Allergy    Arm numbness 03/22/2020   BACK PAIN 10/31/2006   Qualifier: Diagnosis of  By: Joshua CMA, Chemira     Bilateral lower extremity edema 10/19/2019   Bronchitis 09/14/2021   Chronic pain of right knee 11/11/2018   Chronic venous insufficiency 01/13/2019   Controlled type 2 diabetes mellitus with chronic kidney disease, without long-term current use of insulin (HCC) 09/20/2020   CTS (carpal tunnel syndrome) 03/17/2013   Diabetes mellitus without complication (HCC)    no meds, only check bs   Elevated CK 06/07/2021   Essential hypertension 11/05/2007   Qualifier: Diagnosis of  By: Antonio ROSALEA Rockers     Fatigue 09/20/2020   GAS/BLOATING 02/28/2007   Qualifier: Diagnosis of  By: Tita MD, Luis     GASTROENTERITIS 02/28/2007   Qualifier: Diagnosis of  By: Tita MD, Luis     Greater trochanteric bursitis of left hip 05/09/2017   Hyperglycemia 07/17/2012   Hyperlipidemia    Hyperlipidemia associated with type 2 diabetes mellitus (HCC) 10/19/2019   HYPERTENSION, BENIGN ESSENTIAL 10/31/2006   Qualifier: Diagnosis of  By: Joshua CMA, Chemira     KNEE PAIN 01/30/2007   Qualifier: Diagnosis of  By: Tita MD, Luis     Lymphocytosis 11/06/2011   MURMUR 11/20/2007   Qualifier: Diagnosis of  By: Antonio ROSALEA Rockers     Neck pain 06/07/2021   Palpitations 09/20/2020   Primary hypertension 03/22/2020   SINUSITIS- ACUTE-NOS 04/02/2007   Qualifier: Diagnosis of  By: Tita MD, Luis     Uncontrolled type 2 diabetes mellitus with hyperglycemia (HCC) 01/30/2019   Urinary frequency 07/17/2012   Urinary incontinence 11/11/2018   Varicose 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 11/11/2018   Vitamin D deficiency 03/22/2020    Past Surgical History:  Procedure Laterality Date   COLONOSCOPY     UTERINE FIBROID SURGERY      Family History  Problem Relation Age of Onset   Dementia Mother    Hypertension Father    Diabetes Father    Heart attack Father    Diabetes Sister        she had stomach  pain-- cause unknown   Clotting disorder Brother    Colon cancer Neg Hx    Esophageal cancer Neg Hx    Stomach cancer Neg Hx    Rectal cancer Neg Hx    Bladder Cancer Neg Hx    Uterine cancer Neg Hx    Renal cancer Neg Hx     Social History   Socioeconomic History   Marital status: Divorced    Spouse name: Not on file   Number of children: Not on file   Years of education: Not on file   Highest education level: Not on file  Occupational History   Occupation: [employer]     End: 2023    Comment: [employer]   Occupation: [employer]    Start: 2023  Tobacco Use   Smoking status: Never   Smokeless tobacco: Never  Vaping Use   Vaping status: Never Used  Substance and Sexual Activity   Alcohol use: No   Drug use: No   Sexual activity: Not Currently    Partners: Male    Birth control/protection: None  Other Topics Concern   Not on file  Social History Narrative   Not on file   Social Drivers of Health   Financial Resource Strain: Not on file  Food Insecurity: Not on file  Transportation Needs: Not on file  Physical Activity: Not on file  Stress: Not on file  Social Connections: Not on file  Intimate Partner Violence: Not on file    Outpatient Medications Prior to Visit  Medication Sig Dispense Refill   albuterol  (PROVENTIL  HFA) 108 (90 Base) MCG/ACT inhaler Inhale 2 puffs into the lungs every 6 (six) hours as needed for wheezing or shortness of breath. 18 g 3   blood glucose meter kit and supplies KIT Check glucose daily 1 each 0   Coenzyme Q10 (CO Q 10 PO) Take by mouth.     Dulaglutide  (TRULICITY ) 0.75 MG/0.5ML SOAJ Inject 0.75 mg into the skin once a week. 2 mL 3   estradiol (ESTRACE) 0.1 MG/GM vaginal cream Place 1 g vaginally 2 (two) times a week.     famotidine  (PEPCID ) 20 MG tablet Take 1 tablet (20 mg total) by mouth 2 (two) times daily. 60 tablet 5   levocetirizine (XYZAL ) 5 MG tablet TAKE 1 TABLET BY MOUTH ONCE DAILY IN THE EVENING 30  tablet 2   multivitamin-iron-minerals-folic acid  (CENTRUM) chewable tablet Chew 1 tablet by mouth daily.     NONFORMULARY OR COMPOUNDED ITEM Compression stocking -- thigh high  20-30 mm/hg #1  Dx varicose veint 1 each 0   rosuvastatin  (CRESTOR ) 5 MG tablet Take 1 tablet (5 mg total) by mouth daily. 90 tablet 1   VITAMIN D PO Take by mouth.     amLODipine  (NORVASC ) 2.5 MG tablet Take 1 tablet  by mouth once daily 90 tablet 0   olmesartan  (BENICAR ) 40 MG tablet Take 1 tablet by mouth once daily 90 tablet 0   No facility-administered medications prior to visit.    Allergies  Allergen Reactions   Metformin  And Related Other (See Comments)   Pravachol  [Pravastatin  Sodium] Other (See Comments)    Muscle aches   Simvastatin  Other (See Comments)    leg pains    Review of Systems  Constitutional:  Negative for fever and malaise/fatigue.  HENT:  Negative for congestion.   Eyes:  Negative for blurred vision.  Respiratory:  Negative for cough and shortness of breath.   Cardiovascular:  Negative for chest pain, palpitations and leg swelling.  Gastrointestinal:  Negative for vomiting.  Musculoskeletal:  Positive for back pain. Negative for falls.  Skin:  Negative for rash.  Neurological:  Negative for loss of consciousness and headaches.       Objective:    Physical Exam Vitals and nursing note reviewed.  Constitutional:      General: She is not in acute distress.    Appearance: Normal appearance. She is well-developed.  HENT:     Head: Normocephalic and atraumatic.  Eyes:     General: No scleral icterus.       Right eye: No discharge.        Left eye: No discharge.  Cardiovascular:     Rate and Rhythm: Normal rate and regular rhythm.     Heart sounds: No murmur heard. Pulmonary:     Effort: Pulmonary effort is normal. No respiratory distress.     Breath sounds: Normal breath sounds.  Musculoskeletal:        General: No tenderness. Normal range of motion.     Cervical back:  Normal range of motion and neck supple.     Right lower leg: No edema.     Left lower leg: No edema.  Skin:    General: Skin is warm and dry.  Neurological:     Mental Status: She is alert and oriented to person, place, and time.     Motor: No weakness.     Coordination: Coordination normal.     Gait: Gait normal.     Deep Tendon Reflexes: Reflexes normal.  Psychiatric:        Mood and Affect: Mood normal.        Behavior: Behavior normal.        Thought Content: Thought content normal.        Judgment: Judgment normal.     BP (!) 140/100 (BP Location: Left Arm, Patient Position: Sitting, Cuff Size: Large)   Pulse 72   Temp 98.9 F (37.2 C) (Oral)   Resp 16   Ht 5' 7 (1.702 m)   Wt 196 lb 3.2 oz (89 kg)   SpO2 99%   BMI 30.73 kg/m  Wt Readings from Last 3 Encounters:  03/09/24 196 lb 3.2 oz (89 kg)  11/29/23 195 lb 9.6 oz (88.7 kg)  10/22/23 200 lb (90.7 kg)    Diabetic Foot Exam - Simple   No data filed    Lab Results  Component Value Date   WBC 3.4 (L) 11/29/2023   HGB 15.6 (H) 11/29/2023   HCT 47.0 (H) 11/29/2023   PLT 180.0 11/29/2023   GLUCOSE 107 (H) 11/29/2023   CHOL 238 (H) 11/29/2023   TRIG 50.0 11/29/2023   HDL 87.50 11/29/2023   LDLDIRECT 120.9 08/02/2011   LDLCALC 140 (H) 11/29/2023   ALT  21 11/29/2023   AST 24 11/29/2023   NA 143 11/29/2023   K 3.7 11/29/2023   CL 100 11/29/2023   CREATININE 0.78 11/29/2023   BUN 14 11/29/2023   CO2 33 (H) 11/29/2023   TSH 2.81 05/27/2023   HGBA1C 6.4 11/29/2023   MICROALBUR 0.9 11/29/2023    Lab Results  Component Value Date   TSH 2.81 05/27/2023   Lab Results  Component Value Date   WBC 3.4 (L) 11/29/2023   HGB 15.6 (H) 11/29/2023   HCT 47.0 (H) 11/29/2023   MCV 93.4 11/29/2023   PLT 180.0 11/29/2023   Lab Results  Component Value Date   NA 143 11/29/2023   K 3.7 11/29/2023   CO2 33 (H) 11/29/2023   GLUCOSE 107 (H) 11/29/2023   BUN 14 11/29/2023   CREATININE 0.78 11/29/2023   BILITOT  0.8 11/29/2023   ALKPHOS 77 11/29/2023   AST 24 11/29/2023   ALT 21 11/29/2023   PROT 7.4 11/29/2023   ALBUMIN 4.6 11/29/2023   CALCIUM  9.7 11/29/2023   EGFR 79.0 09/19/2023   GFR 80.41 11/29/2023   Lab Results  Component Value Date   CHOL 238 (H) 11/29/2023   Lab Results  Component Value Date   HDL 87.50 11/29/2023   Lab Results  Component Value Date   LDLCALC 140 (H) 11/29/2023   Lab Results  Component Value Date   TRIG 50.0 11/29/2023   Lab Results  Component Value Date   CHOLHDL 3 11/29/2023   Lab Results  Component Value Date   HGBA1C 6.4 11/29/2023       Assessment & Plan:  Chronic left-sided low back pain with left-sided sciatica -     methocarbamol ; Inject 55 mLs (500 mg total) into the vein every 6 (six) hours as needed.  Dispense: 45 mL; Refill: 1 -     Meloxicam; Take 1 tablet (15 mg total) by mouth daily.  Dispense: 30 tablet; Refill: 0  Primary hypertension -     amLODIPine  Besylate; Take 1 tablet (2.5 mg total) by mouth daily.  Dispense: 90 tablet; Refill: 1 -     Olmesartan  Medoxomil; Take 1 tablet (40 mg total) by mouth daily.  Dispense: 90 tablet; Refill: 1 -     CBC with Differential/Platelet; Future -     Comprehensive metabolic panel with GFR; Future -     Hemoglobin A1c; Future -     Lipid panel; Future  Uncontrolled type 2 diabetes mellitus with hyperglycemia (HCC) -     CBC with Differential/Platelet; Future -     Hemoglobin A1c; Future  Hyperlipidemia associated with type 2 diabetes mellitus (HCC) Assessment & Plan: Encourage heart healthy diet such as MIND or DASH diet, increase exercise, avoid trans fats, simple carbohydrates and processed foods, consider a krill or fish or flaxseed oil cap daily.    Orders: -     CBC with Differential/Platelet; Future -     Comprehensive metabolic panel with GFR; Future -     Lipid panel; Future  Need for influenza vaccination -     Flu vaccine trivalent PF, 6mos and  older(Flulaval,Afluria,Fluarix,Fluzone)  Hyperlipidemia, unspecified hyperlipidemia type Assessment & Plan: Encourage heart healthy diet such as MIND or DASH diet, increase exercise, avoid trans fats, simple carbohydrates and processed foods, consider a krill or fish or flaxseed oil cap daily.     Acute left-sided low back pain with left-sided sciatica Assessment & Plan: Muscle relaxer , mobic  Rest , ice  con''t stretching  Essential hypertension Assessment & Plan: Well controlled, no changes to meds. Encouraged heart healthy diet such as the DASH diet and exercise as tolerated.     Assessment and Plan Assessment & Plan Chronic low back pain   Pain is localized to the left side, occasionally radiating to the leg, and is exacerbated by certain movements but relieved by stretching exercises. There is no recent trauma or falls. The pain is described as throbbing and sometimes associated with muscle spasms. Prescribe methocarbamol  for muscle relaxation and advise continuation of stretching exercises for pain relief.  Essential hypertension   There was a recent lapse in medication due to running out of pills. Blood pressure management is crucial to prevent complications such as stroke. She understands the risks of uncontrolled hypertension and the importance of medication adherence. Refill blood pressure medication prescription and advise picking up medication from the pharmacy.  Urinary incontinence   A previous plan for Botox treatment was not completed due to scheduling issues. She is concerned about future management of this condition. Discuss the potential for rescheduling Botox treatment.    Takayla Baillie R Lowne Chase, DO

## 2024-03-09 NOTE — Assessment & Plan Note (Signed)
 Encourage heart healthy diet such as MIND or DASH diet, increase exercise, avoid trans fats, simple carbohydrates and processed foods, consider a krill or fish or flaxseed oil cap daily.

## 2024-03-09 NOTE — Assessment & Plan Note (Signed)
 Muscle relaxer , mobic  Rest , ice  con''t stretching

## 2024-03-11 ENCOUNTER — Other Ambulatory Visit (INDEPENDENT_AMBULATORY_CARE_PROVIDER_SITE_OTHER)

## 2024-03-11 DIAGNOSIS — E1165 Type 2 diabetes mellitus with hyperglycemia: Secondary | ICD-10-CM

## 2024-03-11 DIAGNOSIS — I10 Essential (primary) hypertension: Secondary | ICD-10-CM | POA: Diagnosis not present

## 2024-03-11 DIAGNOSIS — E785 Hyperlipidemia, unspecified: Secondary | ICD-10-CM | POA: Diagnosis not present

## 2024-03-11 DIAGNOSIS — E1169 Type 2 diabetes mellitus with other specified complication: Secondary | ICD-10-CM | POA: Diagnosis not present

## 2024-03-11 LAB — COMPREHENSIVE METABOLIC PANEL WITH GFR
ALT: 20 U/L (ref 0–35)
AST: 23 U/L (ref 0–37)
Albumin: 4.3 g/dL (ref 3.5–5.2)
Alkaline Phosphatase: 71 U/L (ref 39–117)
BUN: 13 mg/dL (ref 6–23)
CO2: 32 meq/L (ref 19–32)
Calcium: 9.2 mg/dL (ref 8.4–10.5)
Chloride: 103 meq/L (ref 96–112)
Creatinine, Ser: 0.84 mg/dL (ref 0.40–1.20)
GFR: 73.42 mL/min (ref >60.00)
Glucose, Bld: 94 mg/dL (ref 70–99)
Potassium: 3.8 meq/L (ref 3.5–5.1)
Sodium: 141 meq/L (ref 135–145)
Total Bilirubin: 0.8 mg/dL (ref 0.2–1.2)
Total Protein: 6.9 g/dL (ref 6.0–8.3)

## 2024-03-11 LAB — CBC WITH DIFFERENTIAL/PLATELET
Basophils Absolute: 0 K/uL (ref 0.0–0.1)
Basophils Relative: 0.7 % (ref 0.0–3.0)
Eosinophils Absolute: 0.1 K/uL (ref 0.0–0.7)
Eosinophils Relative: 2.4 % (ref 0.0–5.0)
HCT: 44.4 % (ref 36.0–46.0)
Hemoglobin: 14.8 g/dL (ref 12.0–15.0)
Lymphocytes Relative: 48.5 % — ABNORMAL HIGH (ref 12.0–46.0)
Lymphs Abs: 2.1 K/uL (ref 0.7–4.0)
MCHC: 33.3 g/dL (ref 30.0–36.0)
MCV: 93.9 fl (ref 78.0–100.0)
Monocytes Absolute: 0.4 K/uL (ref 0.1–1.0)
Monocytes Relative: 9.8 % (ref 3.0–12.0)
Neutro Abs: 1.7 K/uL (ref 1.4–7.7)
Neutrophils Relative %: 38.6 % — ABNORMAL LOW (ref 43.0–77.0)
Platelets: 181 K/uL (ref 150.0–400.0)
RBC: 4.72 Mil/uL (ref 3.87–5.11)
RDW: 14.2 % (ref 11.5–15.5)
WBC: 4.4 K/uL (ref 4.0–10.5)

## 2024-03-11 LAB — LIPID PANEL
Cholesterol: 168 mg/dL (ref 0–200)
HDL: 79.4 mg/dL (ref >39.00)
LDL Cholesterol: 77 mg/dL (ref 0–99)
NonHDL: 88.92
Total CHOL/HDL Ratio: 2
Triglycerides: 59 mg/dL (ref 0.0–149.0)
VLDL: 11.8 mg/dL (ref 0.0–40.0)

## 2024-03-12 ENCOUNTER — Ambulatory Visit: Payer: Self-pay | Admitting: Family Medicine

## 2024-03-12 LAB — HEMOGLOBIN A1C: Hgb A1c MFr Bld: 5.9 % (ref 4.6–6.5)

## 2024-04-04 ENCOUNTER — Other Ambulatory Visit: Payer: Self-pay | Admitting: Family Medicine

## 2024-04-04 DIAGNOSIS — E1165 Type 2 diabetes mellitus with hyperglycemia: Secondary | ICD-10-CM

## 2024-05-04 ENCOUNTER — Encounter: Payer: Self-pay | Admitting: *Deleted

## 2024-05-07 ENCOUNTER — Ambulatory Visit: Admitting: Obstetrics and Gynecology
